# Patient Record
Sex: Male | Born: 1935 | Race: White | Hispanic: No | Marital: Married | Smoking: Never smoker
Health system: Southern US, Community
[De-identification: ages and names within clinical notes are randomized; demographics above are authoritative.]

## PROBLEM LIST (undated history)

## (undated) DIAGNOSIS — I714 Abdominal aortic aneurysm, without rupture, unspecified: Secondary | ICD-10-CM

## (undated) DIAGNOSIS — K219 Gastro-esophageal reflux disease without esophagitis: Secondary | ICD-10-CM

## (undated) DIAGNOSIS — I2510 Atherosclerotic heart disease of native coronary artery without angina pectoris: Secondary | ICD-10-CM

## (undated) DIAGNOSIS — Z8719 Personal history of other diseases of the digestive system: Secondary | ICD-10-CM

## (undated) DIAGNOSIS — I639 Cerebral infarction, unspecified: Secondary | ICD-10-CM

## (undated) DIAGNOSIS — I10 Essential (primary) hypertension: Secondary | ICD-10-CM

## (undated) DIAGNOSIS — H9190 Unspecified hearing loss, unspecified ear: Secondary | ICD-10-CM

## (undated) DIAGNOSIS — E785 Hyperlipidemia, unspecified: Secondary | ICD-10-CM

## (undated) DIAGNOSIS — J189 Pneumonia, unspecified organism: Secondary | ICD-10-CM

## (undated) DIAGNOSIS — J40 Bronchitis, not specified as acute or chronic: Secondary | ICD-10-CM

## (undated) DIAGNOSIS — M1990 Unspecified osteoarthritis, unspecified site: Secondary | ICD-10-CM

## (undated) DIAGNOSIS — N529 Male erectile dysfunction, unspecified: Secondary | ICD-10-CM

## (undated) HISTORY — DX: Male erectile dysfunction, unspecified: N52.9

## (undated) HISTORY — DX: Cerebral infarction, unspecified: I63.9

## (undated) HISTORY — DX: Unspecified osteoarthritis, unspecified site: M19.90

## (undated) HISTORY — PX: TONSILLECTOMY: SUR1361

## (undated) HISTORY — DX: Essential (primary) hypertension: I10

## (undated) HISTORY — PX: EYE SURGERY: SHX253

## (undated) HISTORY — DX: Atherosclerotic heart disease of native coronary artery without angina pectoris: I25.10

## (undated) HISTORY — DX: Abdominal aortic aneurysm, without rupture, unspecified: I71.40

## (undated) HISTORY — DX: Abdominal aortic aneurysm, without rupture: I71.4

## (undated) HISTORY — DX: Hyperlipidemia, unspecified: E78.5

## (undated) HISTORY — PX: INNER EAR SURGERY: SHX679

---

## 1980-07-25 HISTORY — PX: INNER EAR SURGERY: SHX679

## 1997-12-06 ENCOUNTER — Emergency Department (HOSPITAL_COMMUNITY): Admission: EM | Admit: 1997-12-06 | Discharge: 1997-12-06 | Payer: Self-pay | Admitting: Emergency Medicine

## 1997-12-15 ENCOUNTER — Ambulatory Visit (HOSPITAL_COMMUNITY): Admission: RE | Admit: 1997-12-15 | Discharge: 1997-12-15 | Payer: Self-pay | Admitting: Interventional Cardiology

## 2003-04-18 ENCOUNTER — Encounter: Payer: Self-pay | Admitting: Emergency Medicine

## 2003-04-18 ENCOUNTER — Emergency Department (HOSPITAL_COMMUNITY): Admission: EM | Admit: 2003-04-18 | Discharge: 2003-04-18 | Payer: Self-pay | Admitting: Emergency Medicine

## 2013-06-23 ENCOUNTER — Encounter: Payer: Self-pay | Admitting: Cardiology

## 2013-06-23 DIAGNOSIS — I10 Essential (primary) hypertension: Secondary | ICD-10-CM | POA: Insufficient documentation

## 2013-06-23 DIAGNOSIS — I2510 Atherosclerotic heart disease of native coronary artery without angina pectoris: Secondary | ICD-10-CM | POA: Insufficient documentation

## 2013-06-23 DIAGNOSIS — E785 Hyperlipidemia, unspecified: Secondary | ICD-10-CM | POA: Insufficient documentation

## 2013-06-23 DIAGNOSIS — I639 Cerebral infarction, unspecified: Secondary | ICD-10-CM | POA: Insufficient documentation

## 2013-06-24 ENCOUNTER — Encounter: Payer: Self-pay | Admitting: Interventional Cardiology

## 2013-06-24 ENCOUNTER — Ambulatory Visit
Admission: RE | Admit: 2013-06-24 | Discharge: 2013-06-24 | Disposition: A | Discharge status: Home / Self Care | Payer: Medicare Other | Source: Ambulatory Visit | Attending: Interventional Cardiology | Admitting: Interventional Cardiology

## 2013-06-24 ENCOUNTER — Ambulatory Visit (INDEPENDENT_AMBULATORY_CARE_PROVIDER_SITE_OTHER): Payer: Medicare Other | Admitting: Interventional Cardiology

## 2013-06-24 VITALS — BP 110/72 | HR 61 | Ht 69.0 in | Wt 216.0 lb

## 2013-06-24 DIAGNOSIS — I639 Cerebral infarction, unspecified: Secondary | ICD-10-CM

## 2013-06-24 DIAGNOSIS — I6350 Cerebral infarction due to unspecified occlusion or stenosis of unspecified cerebral artery: Secondary | ICD-10-CM

## 2013-06-24 DIAGNOSIS — E785 Hyperlipidemia, unspecified: Secondary | ICD-10-CM

## 2013-06-24 DIAGNOSIS — I209 Angina pectoris, unspecified: Secondary | ICD-10-CM

## 2013-06-24 DIAGNOSIS — I2510 Atherosclerotic heart disease of native coronary artery without angina pectoris: Secondary | ICD-10-CM

## 2013-06-24 DIAGNOSIS — I10 Essential (primary) hypertension: Secondary | ICD-10-CM

## 2013-06-24 LAB — CBC WITH DIFFERENTIAL/PLATELET
Basophils Relative: 0.6 % (ref 0.0–3.0)
Eosinophils Absolute: 0.1 10*3/uL (ref 0.0–0.7)
Eosinophils Relative: 0.6 % (ref 0.0–5.0)
Hemoglobin: 11.4 g/dL — ABNORMAL LOW (ref 13.0–17.0)
Lymphocytes Relative: 24.1 % (ref 12.0–46.0)
MCHC: 33 g/dL (ref 30.0–36.0)
Monocytes Relative: 6.7 % (ref 3.0–12.0)
Neutro Abs: 5.7 10*3/uL (ref 1.4–7.7)
RBC: 4.08 Mil/uL — ABNORMAL LOW (ref 4.22–5.81)
WBC: 8.4 10*3/uL (ref 4.5–10.5)

## 2013-06-24 LAB — BASIC METABOLIC PANEL
CO2: 27 mEq/L (ref 19–32)
Calcium: 8.9 mg/dL (ref 8.4–10.5)
Creatinine, Ser: 1.2 mg/dL (ref 0.4–1.5)
Potassium: 4.6 mEq/L (ref 3.5–5.1)
Sodium: 141 mEq/L (ref 135–145)

## 2013-06-24 MED ORDER — NITROGLYCERIN 0.4 MG SL SUBL: 0.4000 mg | SUBLINGUAL_TABLET | SUBLINGUAL | Status: DC | PRN

## 2013-06-24 NOTE — Patient Instructions (Addendum)
Your physician has requested that you have a cardiac catheterization. Cardiac catheterization is used to diagnose and/or treat various heart conditions. Doctors may recommend this procedure for a number of different reasons. The most common reason is to evaluate chest pain. Chest pain can be a symptom of coronary artery disease (CAD), and cardiac catheterization can show whether plaque is narrowing or blocking your heart's arteries. This procedure is also used to evaluate the valves, as well as measure the blood flow and oxygen levels in different parts of your heart. For further information please visit [email or web address] Please follow instruction sheet, as given.   Your cardiac cath is scheduled for 06/27/13 @7 :30 am  Labs today: Bmet, Cbc  A chest x-ray takes a picture of the organs and structures inside the chest, including the heart, lungs, and blood vessels. This test can show several things, including, whether the heart is enlarges; whether fluid is building up in the lungs; and whether pacemaker / defibrillator leads are still in place.  An Rx for Sublingual Nitroglycerin has been sent to your pharmacy  Nitroglycerin sublingual tablets What is this medicine? NITROGLYCERIN (nye troe GLI ser in) is a type of vasodilator. It relaxes blood vessels, increasing the blood and oxygen supply to your heart. This medicine is used to relieve chest pain caused by angina. It is also used to prevent chest pain before activities like climbing stairs, going outdoors in cold weather, or sexual activity. This medicine may be used for other purposes; ask your health care provider or pharmacist if you have questions. COMMON BRAND NAME(S): Nitroquick, Nitrostat, Nitrotab What should I tell my health care provider before I take this medicine? They need to know if you have any of these conditions: -anemia -head injury, recent stroke, or bleeding in the brain -liver disease -previous heart attack -an unusual or  allergic reaction to nitroglycerin, other medicines, foods, dyes, or preservatives -pregnant or trying to get pregnant -breast-feeding How should I use this medicine? Take this medicine by mouth as needed. At the first sign of an angina attack (chest pain or tightness) place one tablet under your tongue. You can also take this medicine 5 to 10 minutes before an event likely to produce chest pain. Follow the directions on the prescription label. Let the tablet dissolve under the tongue. Do not swallow whole. Replace the dose if you accidentally swallow it. It will help if your mouth is not dry. Saliva around the tablet will help it to dissolve more quickly. Do not eat or drink, smoke or chew tobacco while a tablet is dissolving. If you are not better within 5 minutes after taking ONE dose of nitroglycerin, call 9-1-1 immediately to seek emergency medical care. Do not take more than 3 nitroglycerin tablets over 15 minutes. If you take this medicine often to relieve symptoms of angina, your doctor or health care professional may provide you with different instructions to manage your symptoms. If symptoms do not go away after following these instructions, it is important to call 9-1-1 immediately. Do not take more than 3 nitroglycerin tablets over 15 minutes. Talk to your pediatrician regarding the use of this medicine in children. Special care may be needed. Overdosage: If you think you have taken too much of this medicine contact a poison control center or emergency room at once. NOTE: This medicine is only for you. Do not share this medicine with others. What if I miss a dose? This does not apply. This medicine is only used as needed. What  may interact with this medicine? Do not take this medicine with any of the following medications: -certain migraine medicines like ergotamine and dihydroergotamine (DHE) -medicines used to treat erectile dysfunction like sildenafil, tadalafil, and vardenafil This  medicine may also interact with the following medications: -alteplase -aspirin -heparin -medicines for high blood pressure -medicines for mental depression -other medicines used to treat angina -phenothiazines like chlorpromazine, mesoridazine, prochlorperazine, thioridazine This list may not describe all possible interactions. Give your health care provider a list of all the medicines, herbs, non-prescription drugs, or dietary supplements you use. Also tell them if you smoke, drink alcohol, or use illegal drugs. Some items may interact with your medicine. What should I watch for while using this medicine? Tell your doctor or health care professional if you feel your medicine is no longer working. Keep this medicine with you at all times. Sit or lie down when you take your medicine to prevent falling if you feel dizzy or faint after using it. Try to remain calm. This will help you to feel better faster. If you feel dizzy, take several deep breaths and lie down with your feet propped up, or bend forward with your head resting between your knees. You may get drowsy or dizzy. Do not drive, use machinery, or do anything that needs mental alertness until you know how this drug affects you. Do not stand or sit up quickly, especially if you are an older patient. This reduces the risk of dizzy or fainting spells. Alcohol can make you more drowsy and dizzy. Avoid alcoholic drinks. Do not treat yourself for coughs, colds, or pain while you are taking this medicine without asking your doctor or health care professional for advice. Some ingredients may increase your blood pressure. What side effects may I notice from receiving this medicine? Side effects that you should report to your doctor or health care professional as soon as possible: -blurred vision -dry mouth -skin rash -sweating -the feeling of extreme pressure in the head -unusually weak or tired Side effects that usually do not require medical  attention (report to your doctor or health care professional if they continue or are bothersome): -flushing of the face or neck -headache -irregular heartbeat, palpitations -nausea, vomiting This list may not describe all possible side effects. Call your doctor for medical advice about side effects. You may report side effects to FDA at 1-800-FDA-1088. Where should I keep my medicine? Keep out of the reach of children. Store at room temperature between 20 and 25 degrees C (68 and 77 degrees F). Store in Retail buyer. Protect from light and moisture. Keep tightly closed. Throw away any unused medicine after the expiration date. NOTE: This sheet is a summary. It may not cover all possible information. If you have questions about this medicine, talk to your doctor, pharmacist, or health care provider.  2014, Elsevier/Gold Standard. (2008-02-01 17:16:24)

## 2013-06-24 NOTE — Progress Notes (Signed)
Patient ID: Tyler [name], male   DOB: Sep 30, 1928, 79 y.o.   MRN: [id number]    [address] [place], [place]  [address] Phone: [phone] Fax:  [phone]  Date:  06/24/2013   ID:  Tyler [name], DOB 02-05-1929, MRN [id number]  PCP:  No primary provider on file.   ASSESSMENT:  1. Coronary artery disease with nonobstructive CAD by cath in 2000. Now with ECG abnormality and abnormal ECG 2. Hypertension 3. Hyperlipidemia 4. CVA  PLAN:  1. NTG SL 2. Diagnostic coronary angiography. The procedure and risks of stroke, death, myocardial infarction, allergy, kidney injury, limb ischemia, among others were discussed in detail and except above the patient.   SUBJECTIVE: Tyler [name] is a 79 y.o. male who has developed chest discomfort on exertion since last office visit in December 2013. Walking relatively short distances, particularly after eating brings on tightness in the chest. It takes up to 10 minutes to resolve. Today's EKG reveals biphasic anterior T-wave abnormality with poor R-wave progression. He denies discomfort at rest. He is having some difficulty with swallowing dry foods, which tend to stick. He has to get up to go down with warm liquids. He denies orthopnea, PND, radiation, diaphoresis, and other complaints.   Wt Readings from Last 3 Encounters:  06/24/13 216 lb (97.977 kg)     Past Medical History  Diagnosis Date  . Coronary atherosclerosis   . Osteoarthritis   . Hypertension   . Hyperlipidemia   . Erectile dysfunction   . CVA (cerebral infarction)     Current Outpatient Prescriptions  Medication Sig Dispense Refill  . aspirin 81 MG tablet Take 81 mg by mouth daily.      . Cholecalciferol (VITAMIN D-3) 1000 UNITS CAPS Take by mouth daily.      . clopidogrel (PLAVIX) 75 MG tablet Take 75 mg by mouth daily with breakfast.      . famotidine (PEPCID) 20 MG tablet Take 20 mg by mouth daily.      . folic acid (FOLVITE) 800 MCG tablet Take 400 mcg  by mouth daily.      . hydrocodone-acetaminophen (LORCET-HD) 5-500 MG per capsule Take 1 capsule by mouth every 6 (six) hours as needed for pain.      . meloxicam (MOBIC) 7.5 MG tablet Take 7.5 mg by mouth as needed for pain.      . metoprolol tartrate (LOPRESSOR) 25 MG tablet Take 25 mg by mouth 2 (two) times daily.      . Omega-3 Fatty Acids (FISH OIL PO) Take 360 Units by mouth daily.      . pravastatin (PRAVACHOL) 40 MG tablet Take 40 mg by mouth daily.      [name] terazosin (HYTRIN) 5 MG capsule Take 5 mg by mouth at bedtime.       No current facility-administered medications for this visit.    Allergies:   No Known Allergies  Social History:  The patient  reports that he has never smoked. He has never used smokeless tobacco. He reports that he drinks alcohol. He reports that he does not use illicit drugs.   ROS:  Please see the history of present illness.   With prior history of CVA. CV occurred in 2012. He denies lung disease, orthopnea, palpitations, edema, syncope, claudication, and diabetes.   All other systems reviewed and negative.   OBJECTIVE: VS:  Ht 5\' 9"  (1.753 m)  Wt 216 lb (97.977 kg)  BMI 31.88 kg/m2 Well nourished, well developed,  in no acute distress, elderly HEENT: normal Neck: JVD flat. Carotid bruit absent  Cardiac:  normal S1, S2; RRR; no murmur Lungs:  clear to auscultation bilaterally, no wheezing, rhonchi or rales Abd: soft, nontender, no hepatomegaly Ext: Edema  Trace to 1+ bilateral . Pulses 2+ and symmetric. Both lower extremities have healing lesions from skin cancer removal.  Skin: warm and dry Neuro:  CNs 2-12 intact, no focal abnormalities noted  EKG:  Normal sinus rhythm with poor R-wave progression and anterior T-wave abnormality suggestive of ischemia.       Signed, Darci Needle III, MD 06/24/2013 10:31 AM

## 2013-06-25 ENCOUNTER — Encounter (HOSPITAL_COMMUNITY): Payer: Self-pay | Admitting: Pharmacy Technician

## 2013-06-26 ENCOUNTER — Other Ambulatory Visit: Payer: Self-pay | Admitting: Interventional Cardiology

## 2013-06-26 ENCOUNTER — Telehealth: Payer: Self-pay

## 2013-06-26 DIAGNOSIS — I209 Angina pectoris, unspecified: Secondary | ICD-10-CM

## 2013-06-26 NOTE — Telephone Encounter (Signed)
lmom pt chest xray.No active cardiopulmonary disease on chest x-ray

## 2013-06-26 NOTE — Telephone Encounter (Signed)
Message copied by Jarvis Newcomer on Wed Jun 26, 2013  5:30 PM ------      Message from: Verdis Prime      Created: Tue Jun 25, 2013  5:40 PM       No active cardiopulmonary disease on chest x-ray ------

## 2013-06-26 NOTE — Telephone Encounter (Signed)
pt wife notified cath time changed form 7:30 to 8:30. pt to arrive at 6:30 am [facility] short stay. pt wife verbalized understanding

## 2013-06-27 ENCOUNTER — Encounter (HOSPITAL_COMMUNITY): Payer: Self-pay | Admitting: Anesthesiology

## 2013-06-27 ENCOUNTER — Encounter (HOSPITAL_COMMUNITY)
Admission: RE | Disposition: A | Discharge status: Home Health | Payer: Medicare Other | Source: Ambulatory Visit | Attending: Cardiothoracic Surgery

## 2013-06-27 ENCOUNTER — Inpatient Hospital Stay (HOSPITAL_COMMUNITY)
Admission: RE | Admit: 2013-06-27 | Discharge: 2013-07-03 | DRG: 233 | Disposition: A | Discharge status: Home Health | Payer: Medicare Other | Source: Ambulatory Visit | Attending: Cardiothoracic Surgery | Admitting: Cardiothoracic Surgery

## 2013-06-27 ENCOUNTER — Ambulatory Visit (HOSPITAL_COMMUNITY): Payer: Medicare Other | Admitting: Anesthesiology

## 2013-06-27 ENCOUNTER — Encounter (HOSPITAL_COMMUNITY)
Admission: RE | Disposition: A | Discharge status: Home Health | Payer: Self-pay | Source: Ambulatory Visit | Attending: Cardiothoracic Surgery

## 2013-06-27 ENCOUNTER — Encounter (HOSPITAL_COMMUNITY): Payer: Medicare Other | Admitting: Anesthesiology

## 2013-06-27 ENCOUNTER — Inpatient Hospital Stay (HOSPITAL_COMMUNITY): Payer: Medicare Other

## 2013-06-27 DIAGNOSIS — J9819 Other pulmonary collapse: Secondary | ICD-10-CM | POA: Diagnosis not present

## 2013-06-27 DIAGNOSIS — Z7902 Long term (current) use of antithrombotics/antiplatelets: Secondary | ICD-10-CM

## 2013-06-27 DIAGNOSIS — I5033 Acute on chronic diastolic (congestive) heart failure: Secondary | ICD-10-CM | POA: Diagnosis present

## 2013-06-27 DIAGNOSIS — E785 Hyperlipidemia, unspecified: Secondary | ICD-10-CM | POA: Diagnosis present

## 2013-06-27 DIAGNOSIS — I509 Heart failure, unspecified: Secondary | ICD-10-CM | POA: Diagnosis present

## 2013-06-27 DIAGNOSIS — Z951 Presence of aortocoronary bypass graft: Secondary | ICD-10-CM

## 2013-06-27 DIAGNOSIS — IMO0001 Reserved for inherently not codable concepts without codable children: Secondary | ICD-10-CM | POA: Diagnosis not present

## 2013-06-27 DIAGNOSIS — Z79899 Other long term (current) drug therapy: Secondary | ICD-10-CM

## 2013-06-27 DIAGNOSIS — Z8673 Personal history of transient ischemic attack (TIA), and cerebral infarction without residual deficits: Secondary | ICD-10-CM

## 2013-06-27 DIAGNOSIS — I639 Cerebral infarction, unspecified: Secondary | ICD-10-CM

## 2013-06-27 DIAGNOSIS — L97909 Non-pressure chronic ulcer of unspecified part of unspecified lower leg with unspecified severity: Secondary | ICD-10-CM | POA: Diagnosis present

## 2013-06-27 DIAGNOSIS — I469 Cardiac arrest, cause unspecified: Secondary | ICD-10-CM | POA: Diagnosis not present

## 2013-06-27 DIAGNOSIS — I10 Essential (primary) hypertension: Secondary | ICD-10-CM | POA: Diagnosis present

## 2013-06-27 DIAGNOSIS — I209 Angina pectoris, unspecified: Secondary | ICD-10-CM

## 2013-06-27 DIAGNOSIS — K5900 Constipation, unspecified: Secondary | ICD-10-CM | POA: Diagnosis not present

## 2013-06-27 DIAGNOSIS — Z7982 Long term (current) use of aspirin: Secondary | ICD-10-CM

## 2013-06-27 DIAGNOSIS — R570 Cardiogenic shock: Secondary | ICD-10-CM

## 2013-06-27 DIAGNOSIS — R9431 Abnormal electrocardiogram [ECG] [EKG]: Secondary | ICD-10-CM | POA: Diagnosis present

## 2013-06-27 DIAGNOSIS — I2510 Atherosclerotic heart disease of native coronary artery without angina pectoris: Secondary | ICD-10-CM

## 2013-06-27 DIAGNOSIS — I5031 Acute diastolic (congestive) heart failure: Secondary | ICD-10-CM

## 2013-06-27 DIAGNOSIS — D62 Acute posthemorrhagic anemia: Secondary | ICD-10-CM | POA: Diagnosis not present

## 2013-06-27 DIAGNOSIS — R579 Shock, unspecified: Secondary | ICD-10-CM | POA: Diagnosis not present

## 2013-06-27 DIAGNOSIS — D696 Thrombocytopenia, unspecified: Secondary | ICD-10-CM | POA: Diagnosis not present

## 2013-06-27 DIAGNOSIS — I519 Heart disease, unspecified: Secondary | ICD-10-CM | POA: Diagnosis not present

## 2013-06-27 HISTORY — PX: CORONARY ARTERY BYPASS GRAFT: SHX141

## 2013-06-27 HISTORY — PX: TEE WITHOUT CARDIOVERSION: SHX5443

## 2013-06-27 HISTORY — PX: LEFT HEART CATHETERIZATION WITH CORONARY ANGIOGRAM: SHX5451

## 2013-06-27 HISTORY — PX: ENDOVEIN HARVEST OF GREATER SAPHENOUS VEIN: SHX5059

## 2013-06-27 LAB — POCT I-STAT 3, ART BLOOD GAS (G3+)
Acid-base deficit: 1 mmol/L (ref 0.0–2.0)
Acid-base deficit: 2 mmol/L (ref 0.0–2.0)
Acid-base deficit: 3 mmol/L — ABNORMAL HIGH (ref 0.0–2.0)
Acid-base deficit: 1 mmol/L (ref 0.0–2.0)
Bicarbonate: 25.5 mEq/L — ABNORMAL HIGH (ref 20.0–24.0)
Bicarbonate: 23.3 mEq/L (ref 20.0–24.0)
Bicarbonate: 23.1 mEq/L (ref 20.0–24.0)
Bicarbonate: 22.2 mEq/L (ref 20.0–24.0)
Bicarbonate: 23.7 mEq/L (ref 20.0–24.0)
O2 Saturation: 100 %
O2 Saturation: 100 %
O2 Saturation: 100 %
O2 Saturation: 100 %
O2 Saturation: 99 %
Patient temperature: 35.4
Patient temperature: 36.5
TCO2: 27 mmol/L (ref 0–100)
TCO2: 23 mmol/L (ref 0–100)
TCO2: 23 mmol/L (ref 0–100)
TCO2: 25 mmol/L (ref 0–100)
pCO2 arterial: 35.3 mmHg (ref 35.0–45.0)
pCO2 arterial: 37 mmHg (ref 35.0–45.0)
pCO2 arterial: 36.1 mmHg (ref 35.0–45.0)
pH, Arterial: 7.412 (ref 7.350–7.450)
pO2, Arterial: 478 mmHg — ABNORMAL HIGH (ref 80.0–100.0)
pO2, Arterial: 274 mmHg — ABNORMAL HIGH (ref 80.0–100.0)
pO2, Arterial: 357 mmHg — ABNORMAL HIGH (ref 80.0–100.0)
pO2, Arterial: 273 mmHg — ABNORMAL HIGH (ref 80.0–100.0)
pO2, Arterial: 142 mmHg — ABNORMAL HIGH (ref 80.0–100.0)

## 2013-06-27 LAB — POCT I-STAT, CHEM 8
BUN: 12 mg/dL (ref 6–23)
Calcium, Ion: 1.13 mmol/L (ref 1.13–1.30)
Chloride: 111 mEq/L (ref 96–112)
Creatinine, Ser: 1.1 mg/dL (ref 0.50–1.35)
Potassium: 3.5 mEq/L (ref 3.5–5.1)
TCO2: 22 mmol/L (ref 0–100)

## 2013-06-27 LAB — CBC
HCT: 27.8 % — ABNORMAL LOW (ref 39.0–52.0)
HCT: 27.2 % — ABNORMAL LOW (ref 39.0–52.0)
Hemoglobin: 9.2 g/dL — ABNORMAL LOW (ref 13.0–17.0)
Hemoglobin: 9.1 g/dL — ABNORMAL LOW (ref 13.0–17.0)
MCH: 28 pg (ref 26.0–34.0)
MCH: 28.2 pg (ref 26.0–34.0)
MCHC: 33.1 g/dL (ref 30.0–36.0)
MCHC: 33.5 g/dL (ref 30.0–36.0)
MCV: 84.8 fL (ref 78.0–100.0)
MCV: 84.2 fL (ref 78.0–100.0)
Platelets: 119 10*3/uL — ABNORMAL LOW (ref 150–400)
RBC: 3.28 MIL/uL — ABNORMAL LOW (ref 4.22–5.81)
RBC: 3.23 MIL/uL — ABNORMAL LOW (ref 4.22–5.81)
RDW: 15.3 % (ref 11.5–15.5)
RDW: 15.8 % — ABNORMAL HIGH (ref 11.5–15.5)
WBC: 11.1 10*3/uL — ABNORMAL HIGH (ref 4.0–10.5)
WBC: 11.6 10*3/uL — ABNORMAL HIGH (ref 4.0–10.5)

## 2013-06-27 LAB — POCT I-STAT 4, (NA,K, GLUC, HGB,HCT)
Glucose, Bld: 160 mg/dL — ABNORMAL HIGH (ref 70–99)
Glucose, Bld: 122 mg/dL — ABNORMAL HIGH (ref 70–99)
Glucose, Bld: 116 mg/dL — ABNORMAL HIGH (ref 70–99)
Glucose, Bld: 89 mg/dL (ref 70–99)
Glucose, Bld: 135 mg/dL — ABNORMAL HIGH (ref 70–99)
Glucose, Bld: 148 mg/dL — ABNORMAL HIGH (ref 70–99)
HCT: 20 % — ABNORMAL LOW (ref 39.0–52.0)
HCT: 29 % — ABNORMAL LOW (ref 39.0–52.0)
Hemoglobin: 6.8 g/dL — CL (ref 13.0–17.0)
Hemoglobin: 9.9 g/dL — ABNORMAL LOW (ref 13.0–17.0)
Hemoglobin: 8.2 g/dL — ABNORMAL LOW (ref 13.0–17.0)
Potassium: 3.6 mEq/L (ref 3.5–5.1)
Potassium: 3.8 mEq/L (ref 3.5–5.1)
Potassium: 4.8 mEq/L (ref 3.5–5.1)
Potassium: 4.1 mEq/L (ref 3.5–5.1)
Sodium: 141 mEq/L (ref 135–145)
Sodium: 140 mEq/L (ref 135–145)
Sodium: 141 mEq/L (ref 135–145)
Sodium: 142 mEq/L (ref 135–145)

## 2013-06-27 LAB — HEMOGLOBIN AND HEMATOCRIT, BLOOD
HCT: 23.7 % — ABNORMAL LOW (ref 39.0–52.0)
Hemoglobin: 7.7 g/dL — ABNORMAL LOW (ref 13.0–17.0)

## 2013-06-27 LAB — GLUCOSE, CAPILLARY
Glucose-Capillary: 124 mg/dL — ABNORMAL HIGH (ref 70–99)
Glucose-Capillary: 138 mg/dL — ABNORMAL HIGH (ref 70–99)
Glucose-Capillary: 121 mg/dL — ABNORMAL HIGH (ref 70–99)
Glucose-Capillary: 123 mg/dL — ABNORMAL HIGH (ref 70–99)

## 2013-06-27 LAB — MAGNESIUM: Magnesium: 2.9 mg/dL — ABNORMAL HIGH (ref 1.5–2.5)

## 2013-06-27 LAB — CREATININE, SERUM
Creatinine, Ser: 0.95 mg/dL (ref 0.50–1.35)
GFR calc Af Amer: 87 mL/min — ABNORMAL LOW (ref >90)
GFR calc non Af Amer: 75 mL/min — ABNORMAL LOW (ref >90)

## 2013-06-27 LAB — PLATELET COUNT: Platelets: 72 10*3/uL — ABNORMAL LOW (ref 150–400)

## 2013-06-27 LAB — PROTIME-INR
INR: 0.98 (ref 0.00–1.49)
INR: 1.52 — ABNORMAL HIGH (ref 0.00–1.49)
Prothrombin Time: 12.8 seconds (ref 11.6–15.2)

## 2013-06-27 LAB — MRSA PCR SCREENING: MRSA by PCR: NEGATIVE

## 2013-06-27 LAB — APTT: aPTT: 31 seconds (ref 24–37)

## 2013-06-27 LAB — ABO/RH: ABO/RH(D): A NEG

## 2013-06-27 SURGERY — CORONARY ARTERY BYPASS GRAFTING (CABG)
Anesthesia: General | Site: Chest

## 2013-06-27 SURGERY — CORONARY ARTERY BYPASS GRAFTING (CABG)
Anesthesia: General | Site: Leg Upper | Laterality: Right

## 2013-06-27 SURGERY — LEFT HEART CATHETERIZATION WITH CORONARY ANGIOGRAM
Anesthesia: LOCAL

## 2013-06-27 MED ORDER — LIDOCAINE HCL (PF) 1 % IJ SOLN
INTRAMUSCULAR | Status: AC
  Filled 2013-06-27: qty 30

## 2013-06-27 MED ORDER — BISACODYL 10 MG RE SUPP
10.0000 mg | Freq: Every day | RECTAL | Status: DC
  Administered 2013-06-29: 10 mg via RECTAL
  Filled 2013-06-27 (×2): qty 1

## 2013-06-27 MED ORDER — ACETAMINOPHEN 500 MG PO TABS
1000.0000 mg | ORAL_TABLET | Freq: Four times a day (QID) | ORAL | Status: AC
  Administered 2013-06-28 – 2013-06-30 (×7): 1000 mg via ORAL
  Administered 2013-07-01: 500 mg via ORAL
  Administered 2013-07-01 – 2013-07-02 (×3): 1000 mg via ORAL
  Filled 2013-06-27 (×19): qty 2

## 2013-06-27 MED ORDER — SODIUM CHLORIDE 0.9 % IV SOLN: 1.0000 mL/kg/h | INTRAVENOUS | Status: DC

## 2013-06-27 MED ORDER — EPINEPHRINE HCL 1 MG/ML IJ SOLN
0.5000 ug/min | INTRAVENOUS | Status: DC
  Filled 2013-06-27: qty 4

## 2013-06-27 MED ORDER — ROCURONIUM BROMIDE 100 MG/10ML IV SOLN
INTRAVENOUS | Status: DC | PRN
  Administered 2013-06-27 (×5): 50 mg via INTRAVENOUS

## 2013-06-27 MED ORDER — HEPARIN (PORCINE) IN NACL 2-0.9 UNIT/ML-% IJ SOLN
INTRAMUSCULAR | Status: AC
  Filled 2013-06-27: qty 1000

## 2013-06-27 MED ORDER — SODIUM CHLORIDE 0.9 % IV SOLN: INTRAVENOUS | Status: DC

## 2013-06-27 MED ORDER — LACTATED RINGERS IV SOLN
INTRAVENOUS | Status: DC | PRN
  Administered 2013-06-27: 10:00:00 via INTRAVENOUS

## 2013-06-27 MED ORDER — SODIUM CHLORIDE 0.9 % IV SOLN
INTRAVENOUS | Status: DC
  Administered 2013-06-27: 3 [IU]/h via INTRAVENOUS
  Filled 2013-06-27: qty 1

## 2013-06-27 MED ORDER — HEPARIN SODIUM (PORCINE) 1000 UNIT/ML IJ SOLN
INTRAMUSCULAR | Status: DC | PRN
  Administered 2013-06-27: 20000 [IU] via INTRAVENOUS
  Administered 2013-06-27: 3000 [IU] via INTRAVENOUS

## 2013-06-27 MED ORDER — SODIUM CHLORIDE 0.9 % IV SOLN
250.0000 mL | INTRAVENOUS | Status: DC
  Administered 2013-06-28: 250 mL via INTRAVENOUS

## 2013-06-27 MED ORDER — ACETAMINOPHEN 650 MG RE SUPP
650.0000 mg | Freq: Once | RECTAL | Status: AC
  Administered 2013-06-27: 650 mg via RECTAL

## 2013-06-27 MED ORDER — LACTATED RINGERS IV SOLN: 500.0000 mL | Freq: Once | INTRAVENOUS | Status: DC | PRN

## 2013-06-27 MED ORDER — SODIUM CHLORIDE 0.9 % IV SOLN
INTRAVENOUS | Status: DC
  Administered 2013-06-27: 69.8 mL/h via INTRAVENOUS
  Filled 2013-06-27: qty 40

## 2013-06-27 MED ORDER — LACTATED RINGERS IV SOLN
INTRAVENOUS | Status: DC | PRN
  Administered 2013-06-27 (×2): via INTRAVENOUS

## 2013-06-27 MED ORDER — SODIUM CHLORIDE 0.9 % IV SOLN
INTRAVENOUS | Status: DC
  Filled 2013-06-27: qty 1

## 2013-06-27 MED ORDER — OXYCODONE HCL 5 MG PO TABS
5.0000 mg | ORAL_TABLET | ORAL | Status: DC | PRN
  Administered 2013-06-28 (×2): 5 mg via ORAL
  Filled 2013-06-27 (×2): qty 1

## 2013-06-27 MED ORDER — NITROGLYCERIN IN D5W 200-5 MCG/ML-% IV SOLN: 0.0000 ug/min | INTRAVENOUS | Status: DC

## 2013-06-27 MED ORDER — NITROGLYCERIN IN D5W 200-5 MCG/ML-% IV SOLN
2.0000 ug/min | INTRAVENOUS | Status: DC
  Administered 2013-06-27: 5 ug/min via INTRAVENOUS
  Filled 2013-06-27: qty 250

## 2013-06-27 MED ORDER — DEXTROSE 5 % IV SOLN
1.5000 g | Freq: Two times a day (BID) | INTRAVENOUS | Status: AC
  Administered 2013-06-27 – 2013-06-29 (×4): 1.5 g via INTRAVENOUS
  Filled 2013-06-27 (×4): qty 1.5

## 2013-06-27 MED ORDER — GLYCOPYRROLATE 0.2 MG/ML IJ SOLN
INTRAMUSCULAR | Status: DC | PRN
  Administered 2013-06-27: 0.2 mg via INTRAVENOUS

## 2013-06-27 MED ORDER — SODIUM CHLORIDE 0.9 % IJ SOLN: 3.0000 mL | INTRAMUSCULAR | Status: DC | PRN

## 2013-06-27 MED ORDER — ASPIRIN 81 MG PO CHEW: 324.0000 mg | CHEWABLE_TABLET | Freq: Every day | ORAL | Status: DC

## 2013-06-27 MED ORDER — METOPROLOL TARTRATE 12.5 MG HALF TABLET
12.5000 mg | ORAL_TABLET | Freq: Two times a day (BID) | ORAL | Status: DC
  Administered 2013-06-28 – 2013-06-29 (×3): 12.5 mg via ORAL
  Filled 2013-06-27 (×7): qty 1

## 2013-06-27 MED ORDER — POTASSIUM CHLORIDE 10 MEQ/50ML IV SOLN
10.0000 meq | INTRAVENOUS | Status: AC
  Administered 2013-06-28 (×2): 10 meq via INTRAVENOUS

## 2013-06-27 MED ORDER — PHENYLEPHRINE HCL 10 MG/ML IJ SOLN
10.0000 mg | INTRAVENOUS | Status: DC | PRN
  Administered 2013-06-27: 20 ug/min via INTRAVENOUS

## 2013-06-27 MED ORDER — BISACODYL 5 MG PO TBEC
10.0000 mg | DELAYED_RELEASE_TABLET | Freq: Every day | ORAL | Status: DC
  Administered 2013-06-28 – 2013-07-03 (×2): 10 mg via ORAL
  Filled 2013-06-27 (×4): qty 2

## 2013-06-27 MED ORDER — DOCUSATE SODIUM 100 MG PO CAPS
200.0000 mg | ORAL_CAPSULE | Freq: Every day | ORAL | Status: DC
  Administered 2013-06-28 – 2013-07-03 (×6): 200 mg via ORAL
  Filled 2013-06-27 (×7): qty 2

## 2013-06-27 MED ORDER — MAGNESIUM SULFATE 50 % IJ SOLN
40.0000 meq | INTRAMUSCULAR | Status: DC
  Filled 2013-06-27: qty 10

## 2013-06-27 MED ORDER — SODIUM CHLORIDE 0.9 % IV SOLN
0.0000 mg/h | INTRAVENOUS | Status: DC
  Filled 2013-06-27: qty 10

## 2013-06-27 MED ORDER — DEXMEDETOMIDINE HCL IN NACL 400 MCG/100ML IV SOLN
0.1000 ug/kg/h | INTRAVENOUS | Status: DC
  Administered 2013-06-27: .2 ug/kg/h via INTRAVENOUS
  Filled 2013-06-27: qty 100

## 2013-06-27 MED ORDER — VANCOMYCIN HCL IN DEXTROSE 1-5 GM/200ML-% IV SOLN
1000.0000 mg | Freq: Once | INTRAVENOUS | Status: AC
  Administered 2013-06-27: 1000 mg via INTRAVENOUS
  Filled 2013-06-27: qty 200

## 2013-06-27 MED ORDER — DEXMEDETOMIDINE HCL IN NACL 400 MCG/100ML IV SOLN
0.1000 ug/kg/h | INTRAVENOUS | Status: DC
  Filled 2013-06-27: qty 100

## 2013-06-27 MED ORDER — TERAZOSIN HCL 5 MG PO CAPS
5.0000 mg | ORAL_CAPSULE | Freq: Every day | ORAL | Status: DC
  Administered 2013-06-28 – 2013-07-02 (×5): 5 mg via ORAL
  Filled 2013-06-27 (×7): qty 1

## 2013-06-27 MED ORDER — DEXTROSE 5 % IV SOLN
750.0000 mg | INTRAVENOUS | Status: DC
  Filled 2013-06-27: qty 750

## 2013-06-27 MED ORDER — HEPARIN SODIUM (PORCINE) 1000 UNIT/ML IJ SOLN
INTRAMUSCULAR | Status: AC
  Filled 2013-06-27: qty 1

## 2013-06-27 MED ORDER — METOPROLOL TARTRATE 25 MG/10 ML ORAL SUSPENSION
12.5000 mg | Freq: Two times a day (BID) | ORAL | Status: DC
  Filled 2013-06-27 (×7): qty 5

## 2013-06-27 MED ORDER — MIDAZOLAM HCL 2 MG/2ML IJ SOLN
INTRAMUSCULAR | Status: AC
  Filled 2013-06-27: qty 2

## 2013-06-27 MED ORDER — MORPHINE SULFATE 2 MG/ML IJ SOLN
1.0000 mg | INTRAMUSCULAR | Status: AC | PRN
  Filled 2013-06-27: qty 1

## 2013-06-27 MED ORDER — DEXMEDETOMIDINE HCL IN NACL 200 MCG/50ML IV SOLN
0.1000 ug/kg/h | INTRAVENOUS | Status: DC
  Filled 2013-06-27: qty 50

## 2013-06-27 MED ORDER — METOPROLOL TARTRATE 1 MG/ML IV SOLN: 2.5000 mg | INTRAVENOUS | Status: DC | PRN

## 2013-06-27 MED ORDER — NITROGLYCERIN IN D5W 200-5 MCG/ML-% IV SOLN
INTRAVENOUS | Status: AC
  Filled 2013-06-27: qty 250

## 2013-06-27 MED ORDER — DOPAMINE-DEXTROSE 3.2-5 MG/ML-% IV SOLN
2.0000 ug/kg/min | INTRAVENOUS | Status: DC
  Filled 2013-06-27: qty 250

## 2013-06-27 MED ORDER — SIMVASTATIN 20 MG PO TABS
20.0000 mg | ORAL_TABLET | Freq: Every day | ORAL | Status: DC
  Administered 2013-06-28 – 2013-07-02 (×5): 20 mg via ORAL
  Filled 2013-06-27 (×7): qty 1

## 2013-06-27 MED ORDER — METOCLOPRAMIDE HCL 5 MG/ML IJ SOLN
10.0000 mg | Freq: Four times a day (QID) | INTRAMUSCULAR | Status: AC
  Administered 2013-06-27 – 2013-06-28 (×4): 10 mg via INTRAVENOUS
  Filled 2013-06-27 (×4): qty 2

## 2013-06-27 MED ORDER — MAGNESIUM SULFATE 40 MG/ML IJ SOLN
4.0000 g | Freq: Once | INTRAMUSCULAR | Status: AC
  Administered 2013-06-27: 4 g via INTRAVENOUS
  Filled 2013-06-27: qty 100

## 2013-06-27 MED ORDER — SODIUM CHLORIDE 0.9 % IV SOLN
INTRAVENOUS | Status: DC
  Filled 2013-06-27: qty 30

## 2013-06-27 MED ORDER — HEMOSTATIC AGENTS (NO CHARGE) OPTIME
TOPICAL | Status: DC | PRN
  Administered 2013-06-27: 1 via TOPICAL

## 2013-06-27 MED ORDER — SODIUM CHLORIDE 0.9 % IV SOLN
0.0000 ug/h | INTRAVENOUS | Status: DC
  Filled 2013-06-27: qty 50

## 2013-06-27 MED ORDER — MIDAZOLAM HCL 5 MG/5ML IJ SOLN
INTRAMUSCULAR | Status: DC | PRN
  Administered 2013-06-27: 4 mg via INTRAVENOUS
  Administered 2013-06-27 (×2): 5 mg via INTRAVENOUS
  Administered 2013-06-27: 6 mg via INTRAVENOUS

## 2013-06-27 MED ORDER — SODIUM CHLORIDE 0.45 % IV SOLN
INTRAVENOUS | Status: DC
  Administered 2013-06-27: 16:00:00 via INTRAVENOUS

## 2013-06-27 MED ORDER — INSULIN REGULAR BOLUS VIA INFUSION
0.0000 [IU] | Freq: Three times a day (TID) | INTRAVENOUS | Status: DC
  Filled 2013-06-27: qty 10

## 2013-06-27 MED ORDER — SODIUM CHLORIDE 0.9 % IJ SOLN
3.0000 mL | Freq: Two times a day (BID) | INTRAMUSCULAR | Status: DC
Start: 2013-06-28 — End: 2013-06-30
  Administered 2013-06-28 – 2013-06-30 (×4): 3 mL via INTRAVENOUS

## 2013-06-27 MED ORDER — LACTATED RINGERS IV SOLN: INTRAVENOUS | Status: DC

## 2013-06-27 MED ORDER — ACETAMINOPHEN 160 MG/5ML PO SOLN: 650.0000 mg | Freq: Once | ORAL | Status: AC

## 2013-06-27 MED ORDER — DEXTROSE 5 % IV SOLN
1.5000 g | INTRAVENOUS | Status: DC
  Administered 2013-06-27: 1500 mg via INTRAVENOUS
  Administered 2013-06-27: 750 mg via INTRAVENOUS
  Filled 2013-06-27: qty 1.5

## 2013-06-27 MED ORDER — FAMOTIDINE IN NACL 20-0.9 MG/50ML-% IV SOLN
20.0000 mg | Freq: Two times a day (BID) | INTRAVENOUS | Status: AC
  Administered 2013-06-27 (×2): 20 mg via INTRAVENOUS
  Filled 2013-06-27: qty 50

## 2013-06-27 MED ORDER — CALCIUM CHLORIDE 10 % IV SOLN
INTRAVENOUS | Status: DC | PRN
  Administered 2013-06-27: 300 mg via INTRAVENOUS

## 2013-06-27 MED ORDER — MIDAZOLAM HCL 2 MG/2ML IJ SOLN: 2.0000 mg | INTRAMUSCULAR | Status: DC | PRN

## 2013-06-27 MED ORDER — SODIUM CHLORIDE 0.9 % IJ SOLN: 3.0000 mL | Freq: Two times a day (BID) | INTRAMUSCULAR | Status: DC

## 2013-06-27 MED ORDER — POTASSIUM CHLORIDE 10 MEQ/50ML IV SOLN: 10.0000 meq | INTRAVENOUS | Status: AC

## 2013-06-27 MED ORDER — ONDANSETRON HCL 4 MG/2ML IJ SOLN: 4.0000 mg | Freq: Four times a day (QID) | INTRAMUSCULAR | Status: DC | PRN

## 2013-06-27 MED ORDER — SODIUM CHLORIDE 0.9 % IV SOLN: 250.0000 mL | INTRAVENOUS | Status: DC | PRN

## 2013-06-27 MED ORDER — DOPAMINE-DEXTROSE 3.2-5 MG/ML-% IV SOLN
INTRAVENOUS | Status: DC | PRN
  Administered 2013-06-27: 2 ug/kg/min via INTRAVENOUS

## 2013-06-27 MED ORDER — PANTOPRAZOLE SODIUM 40 MG PO TBEC
40.0000 mg | DELAYED_RELEASE_TABLET | Freq: Every day | ORAL | Status: DC
  Administered 2013-06-29 – 2013-07-03 (×5): 40 mg via ORAL
  Filled 2013-06-27 (×5): qty 1

## 2013-06-27 MED ORDER — ASPIRIN EC 325 MG PO TBEC
325.0000 mg | DELAYED_RELEASE_TABLET | Freq: Every day | ORAL | Status: DC
Start: 2013-06-28 — End: 2013-06-28
  Filled 2013-06-27: qty 1

## 2013-06-27 MED ORDER — CALCIUM CHLORIDE 10 % IV SOLN
1.0000 g | Freq: Once | INTRAVENOUS | Status: AC
  Administered 2013-06-27: 1 g via INTRAVENOUS

## 2013-06-27 MED ORDER — DIAZEPAM 5 MG PO TABS
5.0000 mg | ORAL_TABLET | ORAL | Status: AC
  Administered 2013-06-27: 5 mg via ORAL
  Filled 2013-06-27: qty 1

## 2013-06-27 MED ORDER — ALBUMIN HUMAN 5 % IV SOLN
INTRAVENOUS | Status: DC | PRN
  Administered 2013-06-27: 11:00:00 via INTRAVENOUS

## 2013-06-27 MED ORDER — PROTAMINE SULFATE 10 MG/ML IV SOLN
INTRAVENOUS | Status: DC | PRN
  Administered 2013-06-27: 50 mg via INTRAVENOUS
  Administered 2013-06-27: 10 mg via INTRAVENOUS
  Administered 2013-06-27 (×2): 50 mg via INTRAVENOUS
  Administered 2013-06-27: 30 mg via INTRAVENOUS
  Administered 2013-06-27 (×3): 10 mg via INTRAVENOUS
  Administered 2013-06-27: 50 mg via INTRAVENOUS

## 2013-06-27 MED ORDER — PLASMA-LYTE 148 IV SOLN
INTRAVENOUS | Status: DC
  Filled 2013-06-27: qty 2.5

## 2013-06-27 MED ORDER — VANCOMYCIN HCL 10 G IV SOLR
1250.0000 mg | INTRAVENOUS | Status: DC
  Administered 2013-06-27: 1250 mg via INTRAVENOUS
  Filled 2013-06-27: qty 1250

## 2013-06-27 MED ORDER — POTASSIUM CHLORIDE 2 MEQ/ML IV SOLN
80.0000 meq | INTRAVENOUS | Status: DC
  Filled 2013-06-27: qty 40

## 2013-06-27 MED ORDER — SODIUM CHLORIDE 0.9 % IV SOLN
INTRAVENOUS | Status: DC
  Filled 2013-06-27: qty 40

## 2013-06-27 MED ORDER — ALBUMIN HUMAN 5 % IV SOLN
250.0000 mL | INTRAVENOUS | Status: AC | PRN
  Administered 2013-06-27 (×3): 250 mL via INTRAVENOUS
  Filled 2013-06-27: qty 250

## 2013-06-27 MED ORDER — ASPIRIN 81 MG PO CHEW
81.0000 mg | CHEWABLE_TABLET | ORAL | Status: AC
  Administered 2013-06-27: 81 mg via ORAL
  Filled 2013-06-27: qty 1

## 2013-06-27 MED ORDER — POLYVINYL ALCOHOL 1.4 % OP SOLN: 1.0000 [drp] | OPHTHALMIC | Status: DC | PRN

## 2013-06-27 MED ORDER — PHENYLEPHRINE HCL 10 MG/ML IJ SOLN
0.0000 ug/min | INTRAVENOUS | Status: DC
  Administered 2013-06-28: 15 ug/min via INTRAVENOUS
  Filled 2013-06-27 (×2): qty 2

## 2013-06-27 MED ORDER — FENTANYL CITRATE 0.05 MG/ML IJ SOLN
INTRAMUSCULAR | Status: AC
  Filled 2013-06-27: qty 2

## 2013-06-27 MED ORDER — NITROGLYCERIN 0.2 MG/ML ON CALL CATH LAB
INTRAVENOUS | Status: AC
  Filled 2013-06-27: qty 1

## 2013-06-27 MED ORDER — ACETAMINOPHEN 160 MG/5ML PO SOLN
1000.0000 mg | Freq: Four times a day (QID) | ORAL | Status: AC
  Administered 2013-06-28: 1000 mg
  Filled 2013-06-27: qty 40.6

## 2013-06-27 MED ORDER — FENTANYL CITRATE 0.05 MG/ML IJ SOLN
INTRAMUSCULAR | Status: DC | PRN
  Administered 2013-06-27: 50 ug via INTRAVENOUS
  Administered 2013-06-27: 150 ug via INTRAVENOUS
  Administered 2013-06-27: 50 ug via INTRAVENOUS
  Administered 2013-06-27: 100 ug via INTRAVENOUS
  Administered 2013-06-27: 50 ug via INTRAVENOUS
  Administered 2013-06-27: 150 ug via INTRAVENOUS
  Administered 2013-06-27: 50 ug via INTRAVENOUS
  Administered 2013-06-27: 200 ug via INTRAVENOUS
  Administered 2013-06-27 (×2): 150 ug via INTRAVENOUS
  Administered 2013-06-27: 250 ug via INTRAVENOUS
  Administered 2013-06-27: 100 ug via INTRAVENOUS

## 2013-06-27 MED ORDER — DOPAMINE-DEXTROSE 3.2-5 MG/ML-% IV SOLN: 0.0000 ug/kg/min | INTRAVENOUS | Status: DC

## 2013-06-27 MED ORDER — FOLIC ACID 1 MG PO TABS
1000.0000 ug | ORAL_TABLET | Freq: Every day | ORAL | Status: DC
  Filled 2013-06-27: qty 1

## 2013-06-27 MED ORDER — NOREPINEPHRINE BITARTRATE 1 MG/ML IJ SOLN
INTRAMUSCULAR | Status: AC
  Filled 2013-06-27: qty 4

## 2013-06-27 MED ORDER — PHENYLEPHRINE HCL 10 MG/ML IJ SOLN
30.0000 ug/min | INTRAVENOUS | Status: DC
  Administered 2013-06-27: 20 ug/min via INTRAVENOUS
  Filled 2013-06-27: qty 2

## 2013-06-27 MED ORDER — MORPHINE SULFATE 2 MG/ML IJ SOLN
2.0000 mg | INTRAMUSCULAR | Status: DC | PRN
  Administered 2013-06-27 – 2013-06-28 (×4): 2 mg via INTRAVENOUS
  Filled 2013-06-27 (×3): qty 1

## 2013-06-27 MED ORDER — SODIUM CHLORIDE 0.9 % IJ SOLN
OROMUCOSAL | Status: DC | PRN
  Administered 2013-06-27: 11:00:00 via TOPICAL

## 2013-06-27 MED ORDER — 0.9 % SODIUM CHLORIDE (POUR BTL) OPTIME
TOPICAL | Status: DC | PRN
  Administered 2013-06-27: 6000 mL

## 2013-06-27 MED ORDER — ARTIFICIAL TEARS OP OINT
TOPICAL_OINTMENT | OPHTHALMIC | Status: DC | PRN
  Administered 2013-06-27: 1 via OPHTHALMIC

## 2013-06-27 MED ORDER — PLASMA-LYTE 148 IV SOLN
INTRAVENOUS | Status: DC | PRN
  Administered 2013-06-27: 11:00:00 via INTRAVASCULAR

## 2013-06-27 MED ORDER — VERAPAMIL HCL 2.5 MG/ML IV SOLN
INTRAVENOUS | Status: AC
  Filled 2013-06-27: qty 2

## 2013-06-27 SURGICAL SUPPLY — 113 items
ADAPTER CARDIO PERF ANTE/RETRO (ADAPTER) ×5 IMPLANT
ATTRACTOMAT 16X20 MAGNETIC DRP (DRAPES) ×5 IMPLANT
BAG DECANTER FOR FLEXI CONT (MISCELLANEOUS) ×5 IMPLANT
BANDAGE ELASTIC 4 VELCRO ST LF (GAUZE/BANDAGES/DRESSINGS) ×5 IMPLANT
BANDAGE ELASTIC 6 VELCRO ST LF (GAUZE/BANDAGES/DRESSINGS) ×5 IMPLANT
BANDAGE GAUZE ELAST BULKY 4 IN (GAUZE/BANDAGES/DRESSINGS) ×5 IMPLANT
BASKET HEART  (ORDER IN 25'S) (MISCELLANEOUS) ×1
BASKET HEART (ORDER IN 25'S) (MISCELLANEOUS) ×1
BASKET HEART (ORDER IN 25S) (MISCELLANEOUS) ×3 IMPLANT
BENZOIN TINCTURE PRP APPL 2/3 (GAUZE/BANDAGES/DRESSINGS) ×5 IMPLANT
BLADE STERNUM SYSTEM 6 (BLADE) ×5 IMPLANT
BLADE SURG 11 STRL SS (BLADE) ×5 IMPLANT
BLADE SURG 12 STRL SS (BLADE) ×5 IMPLANT
BLADE SURG ROTATE 9660 (MISCELLANEOUS) ×15 IMPLANT
CANISTER SUCTION 2500CC (MISCELLANEOUS) ×5 IMPLANT
CANNULA AORTIC HI-FLOW 6.5M20F (CANNULA) ×5 IMPLANT
CANNULA ARTERIAL NVNT 3/8 20FR (MISCELLANEOUS) ×5 IMPLANT
CANNULA GUNDRY RCSP 15FR (MISCELLANEOUS) ×10 IMPLANT
CANNULA VENOUS LOW PROF 32X40 (CANNULA) ×5 IMPLANT
CANNULA VENOUS LOW PROF 34X46 (CANNULA) ×5 IMPLANT
CANNULA VENOUS MAL SGL STG 40 (MISCELLANEOUS) IMPLANT
CANNULA VESSEL 3MM BLUNT TIP (CANNULA) ×15 IMPLANT
CANNULAE VENOUS MAL SGL STG 40 (MISCELLANEOUS)
CATH CPB KIT VANTRIGT (MISCELLANEOUS) ×5 IMPLANT
CATH ROBINSON RED A/P 18FR (CATHETERS) ×25 IMPLANT
CATH THORACIC 36FR RT ANG (CATHETERS) ×10 IMPLANT
CLIP FOGARTY SPRING 6M (CLIP) ×5 IMPLANT
CLIP TI WIDE RED SMALL 24 (CLIP) ×5 IMPLANT
CLOSURE STERI-STRIP 1/2X4 (GAUZE/BANDAGES/DRESSINGS) ×1
CLOSURE WOUND 1/2 X4 (GAUZE/BANDAGES/DRESSINGS) ×1
CLSR STERI-STRIP ANTIMIC 1/2X4 (GAUZE/BANDAGES/DRESSINGS) ×4 IMPLANT
COVER SURGICAL LIGHT HANDLE (MISCELLANEOUS) ×5 IMPLANT
CRADLE DONUT ADULT HEAD (MISCELLANEOUS) ×5 IMPLANT
DRAIN CHANNEL 32F RND 10.7 FF (WOUND CARE) ×5 IMPLANT
DRAPE CARDIOVASCULAR INCISE (DRAPES) ×2
DRAPE SLUSH/WARMER DISC (DRAPES) ×5 IMPLANT
DRAPE SRG 135X102X78XABS (DRAPES) ×3 IMPLANT
DRSG AQUACEL AG ADV 3.5X14 (GAUZE/BANDAGES/DRESSINGS) ×5 IMPLANT
ELECT BLADE 4.0 EZ CLEAN MEGAD (MISCELLANEOUS) ×5
ELECT BLADE 6.5 EXT (BLADE) ×5 IMPLANT
ELECT CAUTERY BLADE 6.4 (BLADE) ×5 IMPLANT
ELECT REM PT RETURN 9FT ADLT (ELECTROSURGICAL) ×10
ELECTRODE BLDE 4.0 EZ CLN MEGD (MISCELLANEOUS) ×3 IMPLANT
ELECTRODE REM PT RTRN 9FT ADLT (ELECTROSURGICAL) ×6 IMPLANT
FLOSEAL 10ML (HEMOSTASIS) ×5 IMPLANT
GLOVE BIO SURGEON STRL SZ 6 (GLOVE) ×20 IMPLANT
GLOVE BIO SURGEON STRL SZ 6.5 (GLOVE) ×20 IMPLANT
GLOVE BIO SURGEON STRL SZ7 (GLOVE) ×5 IMPLANT
GLOVE BIO SURGEON STRL SZ7.5 (GLOVE) ×15 IMPLANT
GLOVE BIO SURGEONS STRL SZ 6.5 (GLOVE) ×5
GLOVE BIOGEL PI IND STRL 6 (GLOVE) ×9 IMPLANT
GLOVE BIOGEL PI INDICATOR 6 (GLOVE) ×6
GOWN STRL NON-REIN LRG LVL3 (GOWN DISPOSABLE) ×30 IMPLANT
HEMOSTAT POWDER SURGIFOAM 1G (HEMOSTASIS) ×15 IMPLANT
HEMOSTAT SURGICEL 2X14 (HEMOSTASIS) ×5 IMPLANT
INSERT FOGARTY XLG (MISCELLANEOUS) IMPLANT
KIT BASIN OR (CUSTOM PROCEDURE TRAY) ×5 IMPLANT
KIT ROOM TURNOVER OR (KITS) ×5 IMPLANT
KIT SUCTION CATH 14FR (SUCTIONS) ×5 IMPLANT
KIT TOURNIQUET VASCULAR (MISCELLANEOUS) ×5 IMPLANT
KIT VASOVIEW W/TROCAR VH 2000 (KITS) ×5 IMPLANT
LEAD PACING MYOCARDI (MISCELLANEOUS) ×5 IMPLANT
MARKER GRAFT CORONARY BYPASS (MISCELLANEOUS) ×15 IMPLANT
MATRIX HEMOSTAT SURGIFLO (HEMOSTASIS) ×5 IMPLANT
NS IRRIG 1000ML POUR BTL (IV SOLUTION) ×30 IMPLANT
PACK OPEN HEART (CUSTOM PROCEDURE TRAY) ×5 IMPLANT
PAD ARMBOARD 7.5X6 YLW CONV (MISCELLANEOUS) ×10 IMPLANT
PAD ELECT DEFIB RADIOL ZOLL (MISCELLANEOUS) ×5 IMPLANT
PENCIL BUTTON HOLSTER BLD 10FT (ELECTRODE) ×5 IMPLANT
PUNCH AORTIC ROTATE 4.0MM (MISCELLANEOUS) IMPLANT
PUNCH AORTIC ROTATE 4.5MM 8IN (MISCELLANEOUS) ×5 IMPLANT
PUNCH AORTIC ROTATE 5MM 8IN (MISCELLANEOUS) IMPLANT
SPONGE GAUZE 4X4 12PLY (GAUZE/BANDAGES/DRESSINGS) ×10 IMPLANT
SPONGE LAP 18X18 X RAY DECT (DISPOSABLE) ×10 IMPLANT
SPONGE LAP 4X18 X RAY DECT (DISPOSABLE) ×5 IMPLANT
STRIP CLOSURE SKIN 1/2X4 (GAUZE/BANDAGES/DRESSINGS) ×4 IMPLANT
SUT BONE WAX W31G (SUTURE) ×5 IMPLANT
SUT ETHILON 3 0 FSL (SUTURE) ×5 IMPLANT
SUT MNCRL AB 4-0 PS2 18 (SUTURE) ×5 IMPLANT
SUT PROLENE 3 0 SH DA (SUTURE) IMPLANT
SUT PROLENE 3 0 SH1 36 (SUTURE) IMPLANT
SUT PROLENE 4 0 RB 1 (SUTURE) ×6
SUT PROLENE 4 0 SH DA (SUTURE) ×10 IMPLANT
SUT PROLENE 4 0 TF (SUTURE) ×10 IMPLANT
SUT PROLENE 4-0 RB1 .5 CRCL 36 (SUTURE) ×9 IMPLANT
SUT PROLENE 5 0 C 1 36 (SUTURE) IMPLANT
SUT PROLENE 6 0 C 1 30 (SUTURE) IMPLANT
SUT PROLENE 6 0 CC (SUTURE) ×35 IMPLANT
SUT PROLENE 7 0 DA (SUTURE) IMPLANT
SUT PROLENE 8 0 BV175 6 (SUTURE) IMPLANT
SUT PROLENE BLUE 7 0 (SUTURE) ×10 IMPLANT
SUT PROLENE POLY MONO (SUTURE) ×10 IMPLANT
SUT SILK  1 MH (SUTURE)
SUT SILK 1 MH (SUTURE) IMPLANT
SUT SILK 2 0 SH CR/8 (SUTURE) ×5 IMPLANT
SUT SILK 3 0 SH CR/8 (SUTURE) ×5 IMPLANT
SUT STEEL 6MS V (SUTURE) ×10 IMPLANT
SUT STEEL SZ 6 DBL 3X14 BALL (SUTURE) ×5 IMPLANT
SUT VIC AB 1 CTX 36 (SUTURE) ×4
SUT VIC AB 1 CTX36XBRD ANBCTR (SUTURE) ×6 IMPLANT
SUT VIC AB 2-0 CT1 27 (SUTURE) ×2
SUT VIC AB 2-0 CT1 TAPERPNT 27 (SUTURE) ×3 IMPLANT
SUT VIC AB 2-0 CTX 27 (SUTURE) IMPLANT
SUT VIC AB 3-0 X1 27 (SUTURE) IMPLANT
SUTURE E-PAK OPEN HEART (SUTURE) ×5 IMPLANT
SYSTEM SAHARA CHEST DRAIN ATS (WOUND CARE) ×5 IMPLANT
TAPE CLOTH SURG 4X10 WHT LF (GAUZE/BANDAGES/DRESSINGS) ×5 IMPLANT
TOWEL OR 17X24 6PK STRL BLUE (TOWEL DISPOSABLE) ×10 IMPLANT
TOWEL OR 17X26 10 PK STRL BLUE (TOWEL DISPOSABLE) ×10 IMPLANT
TRAY FOLEY IC TEMP SENS 14FR (CATHETERS) ×5 IMPLANT
TUBING INSUFFLATION 10FT LAP (TUBING) ×5 IMPLANT
UNDERPAD 30X30 INCONTINENT (UNDERPADS AND DIAPERS) ×5 IMPLANT
WATER STERILE IRR 1000ML POUR (IV SOLUTION) ×10 IMPLANT

## 2013-06-27 NOTE — Preoperative (Signed)
Beta Blockers   Reason not to administer Beta Blockers:Not Applicable 

## 2013-06-27 NOTE — Brief Op Note (Signed)
06/27/2013  1:35 PM  PATIENT:  Tyler [name]  79 y.o. male  PRE-OPERATIVE DIAGNOSIS:  left main  POST-OPERATIVE DIAGNOSIS:  left main coronary artery disease  PROCEDURE:  Procedure(s) with comments:  CORONARY ARTERY BYPASS GRAFTING x 4 -LIMA to LAD -SVG to OM 1 -SEQUENTIAL SVG TO PLB,PDA  ENDOVEIN HARVEST OF GREATER SAPHENOUS VEIN (Right)  TRANSESOPHAGEAL ECHOCARDIOGRAM (TEE) (N/A)  SURGEON:  Surgeon(s) and Role:    * Kerin Perna, MD - Primary  PHYSICIAN ASSISTANT: Lowella Dandy PA-C, Coral Ceo PA-C  ANESTHESIA:   general  EBL:  Total I/O In: 250 [IV Piggyback:250] Out: 1200 [Urine:1200]  BLOOD ADMINISTERED: PRBC,  CELLSAVER, 1 unit FFP and 2 units PLTS  DRAINS: Left Pleural Chest Tube, Mediastinal Chest drains   LOCAL MEDICATIONS USED:  NONE  SPECIMEN:  No Specimen  DISPOSITION OF SPECIMEN:  N/A  COUNTS:  YES  TOURNIQUET:  * No tourniquets in log *  DICTATION: .Dragon Dictation  PLAN OF CARE: Admit to inpatient   PATIENT DISPOSITION:  ICU - intubated and hemodynamically stable.   Delay start of Pharmacological VTE agent (>24hrs) due to surgical blood loss or risk of bleeding: yes

## 2013-06-27 NOTE — Progress Notes (Signed)
I was called to go to cath lab by Charge RT to s/u vent.  Once in cath lab, noted pt to be already intubated and being bagged by CRNA.  Pt was being urgently prepped to go to OR, placed pt on vent for approx 5 minutes (on vent settings charted)  Then CRNA bagged pt on 100% fio2 to OR.

## 2013-06-27 NOTE — H&P (Signed)
Abron Neddo is a 79 y.o. male  Admit Date: 06/27/2013 Referring Physician: Cherrie Gauze Leia Alf, M.D. Primary Cardiologist:: H. WB Leia Alf, M.D. Chief complaint / reason for admission: Angina pectoris with minimal exertion  HPI: Mr. Retter is an 79 year old gentleman with a history of progressive exertional chest discomfort over 3 months. When he presented to the office 72 hours ago he complained that he could barely walk to his mailbox at the end of the driveway without chest discomfort. Episodes have not occurred at rest but the duration of the episodes were increasing. There was associated dyspnea. An EKG performed at that time demonstrated new anterior T-wave inversions when compared to the tracing from last year. He was totally pain-free in the office on the day of evaluation. Because of the high likelihood of progression of coronary disease we discussed we discussed proceeding to coronary angiography rather than stress testing. We also discussed whether he would do it here in [place] or at his residence in [place].    PMH:    Past Medical History  Diagnosis Date  . Coronary atherosclerosis   . Osteoarthritis   . Hypertension   . Hyperlipidemia   . Erectile dysfunction   . CVA (cerebral infarction)     Past Surgical History: none  ALLERGIES:   Review of patient's allergies indicates no known allergies.  Prior to Admit Meds:   Prescriptions prior to admission  Medication Sig Dispense Refill  . aspirin 81 MG tablet Take 81 mg by mouth daily.      . Cholecalciferol (VITAMIN D-3) 1000 UNITS CAPS Take 1 capsule by mouth daily.       . clopidogrel (PLAVIX) 75 MG tablet Take 75 mg by mouth daily with breakfast.      . folic acid (FOLVITE) 800 MCG tablet Take 800 mcg by mouth daily.       . metoprolol tartrate (LOPRESSOR) 25 MG tablet Take 25 mg by mouth 2 (two) times daily.      . Multiple Vitamin (MULTIVITAMIN) tablet Take 1 tablet by mouth daily.      .  nitroGLYCERIN (NITROSTAT) 0.4 MG SL tablet Place 0.4 mg under the tongue every 5 (five) minutes as needed for chest pain.      . Omega-3 Fatty Acids (FISH OIL PO) Take 100 Units by mouth daily.       . pravastatin (PRAVACHOL) 40 MG tablet Take 40 mg by mouth at bedtime.       [name] terazosin (HYTRIN) 5 MG capsule Take 5 mg by mouth at bedtime.       Family HX:    Family History  Problem Relation Age of Onset  . Heart disease Sister   . Heart disease Brother    Social HX:    History   Social History  . Marital Status: Married    Spouse Name: N/A    Number of Children: N/A  . Years of Education: N/A   Occupational History  . Not on file.   Social History Main Topics  . Smoking status: Never Smoker   . Smokeless tobacco: Never Used  . Alcohol Use: Yes     Comment: Rare  . Drug Use: No  . Sexual Activity: Not on file   Other Topics Concern  . Not on file   Social History Narrative  . No narrative on file     ROS: No new neurological complaints. DOE increasing. Denies GI bleeding. No history of syncope.  Physical Exam: Blood pressure 106/64, pulse 61, temperature 97.6 F (36.4 C), temperature source Oral, resp. rate 18, height 5\' 9"  (1.753 m), weight 216 lb (97.977 kg), SpO2 98.00%.    somewhat pale appearing but in no distress HEENT exam unremarkable Neck exam with no JVD or carotid bruits Chest is clear to auscultation and percussion Cardiac exam reveals an S4 gallop. Abdomen soft with normal bowel sounds. No bruits. Extremities reveal no edema. Ulcer left lower extremity  Neurological exam is grossly unremarkable  Labs: Lab Results  Component Value Date   WBC 8.4 06/24/2013   HGB 11.4* 06/24/2013   HCT 34.6* 06/24/2013   MCV 84.9 06/24/2013   PLT 163.0 06/24/2013    Recent Labs Lab 06/24/13 1233  NA 141  K 4.6  CL 106  CO2 27  BUN 19  CREATININE 1.2  CALCIUM 8.9  GLUCOSE 93     Radiology:  Chest x-ray on 06/24/13 revealed no acute disease process    EKG:  ECG done on the office visit on 06/24/13 demonstrated new anterior T-wave inversions. Normal sinus rhythm was noted.  ASSESSMENT:  1. Class III angina pectoris, progressive over the past 3 months 2. Hypertension 3. History of CVA 2-1/2 years ago without recurrent symptoms 4. Hyperlipidemia  Plan:  The patient is exhibiting symptoms of severe angina. These symptoms are relatively new over the past 3 months and progressive. I have counseled the patient undergo diagnostic cardiac catheterization his symptoms we can arrange it. He understands other risks would be the possibility of death, stroke, myocardial infarction, kidney injury, allergy, bleeding, infection, among other significant complications at his age. The risk of stroke is particularly concerning given his relatively new stroke 2 years ago. The patient understands these risks and is willing to proceed to identify the source of his complaints.  We did have some discussion about whether to do the procedure here or in [facility] where he lives 10-11 months of the year. He preferred to have the procedure done here as both of his daughters lives in [place]. Lesleigh Noe 06/27/2013 10:09 AM

## 2013-06-27 NOTE — H&P (View-Only) (Signed)
Patient ID: Tyler [name], male   DOB: 04/06/1929, 79 y.o.   MRN: [id number]    [address] [place], [place]  [address] Phone: [phone] Fax:  [phone]  Date:  06/24/2013   ID:  Tyler [name], DOB 01/08/1929, MRN [id number]  PCP:  No primary provider on file.   ASSESSMENT:  1. Coronary artery disease with nonobstructive CAD by cath in 2000. Now with ECG abnormality and abnormal ECG 2. Hypertension 3. Hyperlipidemia 4. CVA  PLAN:  1. NTG SL 2. Diagnostic coronary angiography. The procedure and risks of stroke, death, myocardial infarction, allergy, kidney injury, limb ischemia, among others were discussed in detail and except above the patient.   SUBJECTIVE: Tyler [name] is a 79 y.o. male who has developed chest discomfort on exertion since last office visit in December 2013. Walking relatively short distances, particularly after eating brings on tightness in the chest. It takes up to 10 minutes to resolve. Today's EKG reveals biphasic anterior T-wave abnormality with poor R-wave progression. He denies discomfort at rest. He is having some difficulty with swallowing dry foods, which tend to stick. He has to get up to go down with warm liquids. He denies orthopnea, PND, radiation, diaphoresis, and other complaints.   Wt Readings from Last 3 Encounters:  06/24/13 216 lb (97.977 kg)     Past Medical History  Diagnosis Date  . Coronary atherosclerosis   . Osteoarthritis   . Hypertension   . Hyperlipidemia   . Erectile dysfunction   . CVA (cerebral infarction)     Current Outpatient Prescriptions  Medication Sig Dispense Refill  . aspirin 81 MG tablet Take 81 mg by mouth daily.      . Cholecalciferol (VITAMIN D-3) 1000 UNITS CAPS Take by mouth daily.      . clopidogrel (PLAVIX) 75 MG tablet Take 75 mg by mouth daily with breakfast.      . famotidine (PEPCID) 20 MG tablet Take 20 mg by mouth daily.      . folic acid (FOLVITE) 800 MCG tablet Take 400 mcg  by mouth daily.      . hydrocodone-acetaminophen (LORCET-HD) 5-500 MG per capsule Take 1 capsule by mouth every 6 (six) hours as needed for pain.      . meloxicam (MOBIC) 7.5 MG tablet Take 7.5 mg by mouth as needed for pain.      . metoprolol tartrate (LOPRESSOR) 25 MG tablet Take 25 mg by mouth 2 (two) times daily.      . Omega-3 Fatty Acids (FISH OIL PO) Take 360 Units by mouth daily.      . pravastatin (PRAVACHOL) 40 MG tablet Take 40 mg by mouth daily.      . terazosin (HYTRIN) 5 MG capsule Take 5 mg by mouth at bedtime.       No current facility-administered medications for this visit.    Allergies:   No Known Allergies  Social History:  The patient  reports that he has never smoked. He has never used smokeless tobacco. He reports that he drinks alcohol. He reports that he does not use illicit drugs.   ROS:  Please see the history of present illness.   With prior history of CVA. CV occurred in 2012. He denies lung disease, orthopnea, palpitations, edema, syncope, claudication, and diabetes.   All other systems reviewed and negative.   OBJECTIVE: VS:  Ht 5' 9" (1.753 m)  Wt 216 lb (97.977 kg)  BMI 31.88 kg/m2 Well nourished, well developed,   in no acute distress, elderly HEENT: normal Neck: JVD flat. Carotid bruit absent  Cardiac:  normal S1, S2; RRR; no murmur Lungs:  clear to auscultation bilaterally, no wheezing, rhonchi or rales Abd: soft, nontender, no hepatomegaly Ext: Edema  Trace to 1+ bilateral . Pulses 2+ and symmetric. Both lower extremities have healing lesions from skin cancer removal.  Skin: warm and dry Neuro:  CNs 2-12 intact, no focal abnormalities noted  EKG:  Normal sinus rhythm with poor R-wave progression and anterior T-wave abnormality suggestive of ischemia.       Signed, Henry W. B. Smith III, MD 06/24/2013 10:31 AM   

## 2013-06-27 NOTE — CV Procedure (Signed)
9fr sheath aspirated and removed from right radial artery. Tr band applied per protocol. 12cc air at 16:55. Spo2 measured in right thumb 95%. Dr. Alla German suggested a longer tr band time due to heparin, platelets etc. Dr. Alla German and i discussed a 90 minute time before deflation and he agreed. Advised the nurse to call the cathlab and or [id number] if assistance is needed.

## 2013-06-27 NOTE — Progress Notes (Signed)
TR band removed following protocol with no complications.  Starting at 1900, 3 cc removed from band every 15 minutes. VS documented and O2 sats in high 90's.  Will continue to monitor.

## 2013-06-27 NOTE — Progress Notes (Signed)
Echo Lab  OR TEE completed.  Shalva Rozycki L Luciana Cammarata, RDCS 06/27/2013 11:29 AM

## 2013-06-27 NOTE — Progress Notes (Signed)
Chaplain met pt. Family in cath. Lab. Pt. Was scheduled for a routine procedure. During procedure pt.coded and was taken for emergency surgery. Chaplain provided spiritual care, prayer and listening ear to wife and daughters. Chaplain remained with family during doctors consult. Chaplain escorted family to surgical waiting room and advised if any further assistance was needed to please ask the receptionist to page chaplain. Pt. Will be admitted,possible follow up to family in a day or so is needed by chaplain staff  Cindie Crumbly, Chaplain       06/27/13 1100  Clinical Encounter Type  Visited With Family  Visit Type Initial  Referral From Nurse  Consult/Referral To Chaplain  Spiritual Encounters  Spiritual Needs Emotional;Prayer  Stress Factors  Patient Stress Factors None identified  Family Stress Factors None identified

## 2013-06-27 NOTE — CV Procedure (Addendum)
     Left Heart Catheterization with Coronary Angiography and emergency IABP Report  Kaliel Bolds  79 y.o.  male [birth year]  Procedure Date: 06/27/2013  Referring Physician: HWBSmith, III Primary Cardiologist:: HWBSmith, iii  INDICATIONS: Class III angina with new T-wave changes on EKG  PROCEDURE: 1. Left heart catheterization; 2. Selective coronary angiography; 3. Left ventriculography by hand injection; 4. Emergency intra-aortic balloon pump  CONSENT:  The risks, benefits, and details of the procedure were explained in detail to the patient. Risks including death, stroke, heart attack, kidney injury, allergy, limb ischemia, bleeding and radiation injury were discussed.  The patient verbalized understanding and wanted to proceed.  Informed written consent was obtained.  PROCEDURE TECHNIQUE:  After Xylocaine anesthesia a 5 French slender sheath was placed in the right radial artery with a single anterior needle wall stick.  Coronary angiography was done using a 5 [place] JR 4, 3.5 cm JL, and 3.5 cm 5 [place] EBU catheter.  Left ventriculography was done using the 5 French Judkins right catheter and hand injection.   The initial shot of the left coronary demonstrated pressure damping on engagement in absence of contrast refluxed into the ascending aorta. Severe left coronary disease and ostial disease was noted. The catheter was disengaged. The 3.5 cm 5 [place] EBU was then used to obtain a flush shots of the left main demonstrating a severe ulcerated plaque in the ostium/proximal left main. Coronary artery bypass surgery was felt to be indicated and the cardiac surgeons were called promptly from the cath lab. The patient remained stable for 10-15 minutes post final angiogram. He then began to experience mild chest discomfort along with significant ST segment depression.  Because of this and the demonstrated anatomy we placed an intra-aortic balloon pump emergently. During the insertion of the  balloon pump the patient developed shock and unresponsiveness. CPR was performed for approximately 60 seconds. And IV Levophed drip was initially considered but never started. Anesthesia and cardiothoracic surgery arrived shortly. The patient was intubated. His blood pressure became much more stable. He was sedated. He did have movement of his arms and legs.  MEDICATIONS: Versed, fentanyl, and IV heparin 7000 units  CONTRAST:  Total of 100 cc.  COMPLICATIONS:  Pump failure with CPR required   HEMODYNAMICS:  Aortic pressure 110/58; LV pressure 110/14; LVEDP 22,  ANGIOGRAPHIC DATA:     The left main coronary artery is 95% calcified proximal to ostial left main stenosis..  The left anterior descending artery is 80-90% mid LAD after the first septal perforator.  The left circumflex artery is proximal 80-90% obstruction.  The right coronary artery is ostial 70% with a large distal PDA and 99% stenosis proximal to the origin of a large left ventricular. The patient is right dominant.[name]   LEFT VENTRICULOGRAM:  Left ventricular angiogram was done in the 30 RAO projection and revealed inferoapical hypokinesis. EF 50%   IMPRESSIONS:  1. Severe three-vessel coronary disease including ostial left main demonstrated by catheterization  2. Clinical evidence of left main closure post catheterization exhibited by dramatic ST segment depression on monitor and development of shock.  3. Reversal of shock state with intra-aortic balloon pump, less than 1 minute of CPR, and improved oxygenation. Did not have ventricular arrhythmia.   RECOMMENDATION:  Emergency coronary artery bypass grafting from the Cath Lab straight to the OR.

## 2013-06-27 NOTE — Consult Note (Addendum)
Reason for Consult:left main disease Referring Physician: Dr. Ronna Polio is an 79 y.o. male.  HPI: 79 yo with history of stroke on plavix. Severe angina. Cath today. Critical left main disease. Had arrest after injection requiring CPR. Now intubated with ROSC and IABP in place.    Past Medical History  Diagnosis Date  . Coronary atherosclerosis   . Osteoarthritis   . Hypertension   . Hyperlipidemia   . Erectile dysfunction   . CVA (cerebral infarction)     No past surgical history on file.  Family History  Problem Relation Age of Onset  . Heart disease Sister   . Heart disease Brother     Social History:  reports that he has never smoked. He has never used smokeless tobacco. He reports that he drinks alcohol. He reports that he does not use illicit drugs.  Allergies: No Known Allergies  Medications:  Prior to Admission:  Prescriptions prior to admission  Medication Sig Dispense Refill  . aspirin 81 MG tablet Take 81 mg by mouth daily.      . Cholecalciferol (VITAMIN D-3) 1000 UNITS CAPS Take 1 capsule by mouth daily.       . clopidogrel (PLAVIX) 75 MG tablet Take 75 mg by mouth daily with breakfast.      . folic acid (FOLVITE) 800 MCG tablet Take 800 mcg by mouth daily.       . metoprolol tartrate (LOPRESSOR) 25 MG tablet Take 25 mg by mouth 2 (two) times daily.      . Multiple Vitamin (MULTIVITAMIN) tablet Take 1 tablet by mouth daily.      . nitroGLYCERIN (NITROSTAT) 0.4 MG SL tablet Place 0.4 mg under the tongue every 5 (five) minutes as needed for chest pain.      . Omega-3 Fatty Acids (FISH OIL PO) Take 100 Units by mouth daily.       . pravastatin (PRAVACHOL) 40 MG tablet Take 40 mg by mouth at bedtime.       [name] terazosin (HYTRIN) 5 MG capsule Take 5 mg by mouth at bedtime.        Results for orders placed during the hospital encounter of 06/27/13 (from the past 48 hour(s))  PROTIME-INR     Status: None   Collection Time    06/27/13  7:30 AM   Result Value Range   Prothrombin Time 12.8  11.6 - 15.2 seconds   INR 0.98  0.00 - 1.49    No results found.  Review of Systems  Unable to perform ROS: intubated   Blood pressure 106/64, pulse 61, temperature 97.6 F (36.4 C), temperature source Oral, resp. rate 18, height 5\' 9"  (1.753 m), weight 216 lb (97.977 kg), SpO2 98.00%. Physical Exam  Vitals reviewed. Constitutional:  Intubated, sedated  Cardiovascular: Normal rate and regular rhythm.   IABP in place right femoral artery  Respiratory: Breath sounds normal.    Assessment/Plan: 79 yo male with critical left main disease. VF arrest during cath. Now intubated and relatively stable with IABP in place.  Needs emergent CABG.  I discussed the need for emergent CABG with Mrs. Chalmers. She understands that CABG is only reasonable hope for survival, but is high risk under these circumstances. She understands the risks of death, stroke, MI, bleeding, need for transfusion, infection, DVT/ PE, other organ system failure. She gives consent for emergency CABG.  Dr. Donata Clay is on call and will do CABG, but was tied up in OR. I was available  so I came to facilitate the process. Mrs. Capelle understands Dr. Donata Clay will do procedure.  OR notified and patient en route.   Anndee Connett C 06/27/2013, 10:00 AM

## 2013-06-27 NOTE — Progress Notes (Signed)
Dr Donata Clay at bedside to advance IABP due to x-ray results.  Will continue to monitor.

## 2013-06-27 NOTE — Transfer of Care (Signed)
Immediate Anesthesia Transfer of Care Note  Patient: Tyler [name]  Procedure(s) Performed: Procedure(s) with comments: CORONARY ARTERY BYPASS GRAFTING (CABG) (N/A) - times four utilizing the left internal mammary artery and the right greater saphenous vein harvested endoscopically TRANSESOPHAGEAL ECHOCARDIOGRAM (TEE) (N/A) ENDOVEIN HARVEST OF GREATER SAPHENOUS VEIN (Right)  Patient Location: SICU  Anesthesia Type:General  Level of Consciousness: sedated, unresponsive and Patient remains intubated per anesthesia plan  Airway & Oxygen Therapy: Patient remains intubated per anesthesia plan and Patient placed on Ventilator (see vital sign flow sheet for setting)  Post-op Assessment: Report given to PACU RN and Post -op Vital signs reviewed and stable  Post vital signs: Reviewed and stable  Complications: No apparent anesthesia complications

## 2013-06-27 NOTE — Interval H&P Note (Signed)
History and Physical Interval Note:  06/27/2013 8:41 AM  Tyler [name]  has presented today for surgery, with the diagnosis of cad  The various methods of treatment have been discussed with the patient and family. After consideration of risks, benefits and other options for treatment, the patient has consented to  Procedure(s): LEFT HEART CATHETERIZATION WITH CORONARY ANGIOGRAM (N/A) as a surgical intervention .  The patient's history has been reviewed, patient examined, no change in status, stable for surgery.  I have reviewed the patient's chart and labs.  Questions were answered to the patient's satisfaction.     Tyler [name] W  Please refer to the office note from less than 7 days ago. The patient has been having easily provokable exertional chest discomfort. He has a new anterior T-wave abnormality on his EKG. The studies being done to define anatomy.Cath Lab Visit (complete for each Cath Lab visit)  Clinical Evaluation Leading to the Procedure:   ACS: no  Non-ACS:    Anginal Classification: CCS III  Anti-ischemic medical therapy: Minimal Therapy (1 class of medications)  Non-Invasive Test Results: No non-invasive testing performed  Prior CABG: No previous CABG

## 2013-06-27 NOTE — Anesthesia Preprocedure Evaluation (Addendum)
Anesthesia Evaluation  Patient identified by MRN, date of birth, ID band Patient unresponsive    Reviewed: Unable to perform ROS - Chart review only  Airway       Dental   Pulmonary          Cardiovascular hypertension, + angina + CAD     Neuro/Psych CVA    GI/Hepatic   Endo/Other    Renal/GU      Musculoskeletal   Abdominal   Peds  Hematology   Anesthesia Other Findings Cath Lab arrest. Intubated and sedated.  Reproductive/Obstetrics                          Anesthesia Physical Anesthesia Plan  ASA: IV and emergent  Anesthesia Plan: General   Post-op Pain Management:    Induction: Intravenous  Airway Management Planned: Oral ETT  Additional Equipment: Arterial line, CVP, PA Cath and TEE  Intra-op Plan:   Post-operative Plan: Post-operative intubation/ventilation  Informed Consent:   Plan Discussed with: CRNA, Anesthesiologist and Surgeon  Anesthesia Plan Comments:        Anesthesia Quick Evaluation

## 2013-06-28 ENCOUNTER — Encounter (HOSPITAL_COMMUNITY): Payer: Self-pay | Admitting: Cardiothoracic Surgery

## 2013-06-28 ENCOUNTER — Inpatient Hospital Stay (HOSPITAL_COMMUNITY): Payer: Medicare Other

## 2013-06-28 LAB — PREPARE FRESH FROZEN PLASMA: Unit division: 0

## 2013-06-28 LAB — POCT I-STAT, CHEM 8
Calcium, Ion: 1.15 mmol/L (ref 1.13–1.30)
Chloride: 105 mEq/L (ref 96–112)
Creatinine, Ser: 1.3 mg/dL (ref 0.50–1.35)
Glucose, Bld: 156 mg/dL — ABNORMAL HIGH (ref 70–99)
Hemoglobin: 8.2 g/dL — ABNORMAL LOW (ref 13.0–17.0)
Potassium: 3.5 mEq/L (ref 3.5–5.1)

## 2013-06-28 LAB — CBC
HCT: 24.1 % — ABNORMAL LOW (ref 39.0–52.0)
Hemoglobin: 8.7 g/dL — ABNORMAL LOW (ref 13.0–17.0)
MCH: 28.3 pg (ref 26.0–34.0)
MCH: 28.5 pg (ref 26.0–34.0)
MCHC: 33.6 g/dL (ref 30.0–36.0)
MCV: 84 fL (ref 78.0–100.0)
MCV: 84.9 fL (ref 78.0–100.0)
Platelets: 96 10*3/uL — ABNORMAL LOW (ref 150–400)
RBC: 3.07 MIL/uL — ABNORMAL LOW (ref 4.22–5.81)
RBC: 2.84 MIL/uL — ABNORMAL LOW (ref 4.22–5.81)
RDW: 16.3 % — ABNORMAL HIGH (ref 11.5–15.5)

## 2013-06-28 LAB — PREPARE PLATELET PHERESIS
Unit division: 0
Unit division: 0

## 2013-06-28 LAB — GLUCOSE, CAPILLARY
Glucose-Capillary: 137 mg/dL — ABNORMAL HIGH (ref 70–99)
Glucose-Capillary: 153 mg/dL — ABNORMAL HIGH (ref 70–99)
Glucose-Capillary: 107 mg/dL — ABNORMAL HIGH (ref 70–99)
Glucose-Capillary: 125 mg/dL — ABNORMAL HIGH (ref 70–99)
Glucose-Capillary: 103 mg/dL — ABNORMAL HIGH (ref 70–99)
Glucose-Capillary: 142 mg/dL — ABNORMAL HIGH (ref 70–99)

## 2013-06-28 LAB — POCT I-STAT 3, ART BLOOD GAS (G3+)
Bicarbonate: 20.8 mEq/L (ref 20.0–24.0)
Bicarbonate: 25.1 mEq/L — ABNORMAL HIGH (ref 20.0–24.0)
Patient temperature: 36.6
pCO2 arterial: 40.3 mmHg (ref 35.0–45.0)
pCO2 arterial: 43.5 mmHg (ref 35.0–45.0)
pH, Arterial: 7.319 — ABNORMAL LOW (ref 7.350–7.450)
pH, Arterial: 7.368 (ref 7.350–7.450)
pO2, Arterial: 101 mmHg — ABNORMAL HIGH (ref 80.0–100.0)

## 2013-06-28 LAB — BASIC METABOLIC PANEL
CO2: 24 mEq/L (ref 19–32)
Calcium: 7.8 mg/dL — ABNORMAL LOW (ref 8.4–10.5)
Creatinine, Ser: 1.08 mg/dL (ref 0.50–1.35)
Glucose, Bld: 128 mg/dL — ABNORMAL HIGH (ref 70–99)

## 2013-06-28 LAB — PROTIME-INR
INR: 1.39 (ref 0.00–1.49)
Prothrombin Time: 16.7 seconds — ABNORMAL HIGH (ref 11.6–15.2)

## 2013-06-28 LAB — APTT: aPTT: 34 seconds (ref 24–37)

## 2013-06-28 LAB — POCT ACTIVATED CLOTTING TIME: Activated Clotting Time: 140 seconds

## 2013-06-28 MED ORDER — POTASSIUM CHLORIDE 10 MEQ/50ML IV SOLN
10.0000 meq | Freq: Once | INTRAVENOUS | Status: AC
  Administered 2013-06-28: 10 meq via INTRAVENOUS
  Filled 2013-06-28: qty 50

## 2013-06-28 MED ORDER — INSULIN DETEMIR 100 UNIT/ML ~~LOC~~ SOLN
8.0000 [IU] | Freq: Two times a day (BID) | SUBCUTANEOUS | Status: DC
  Administered 2013-06-28 – 2013-06-30 (×6): 8 [IU] via SUBCUTANEOUS
  Filled 2013-06-28 (×9): qty 0.08

## 2013-06-28 MED ORDER — POTASSIUM CHLORIDE 10 MEQ/50ML IV SOLN
10.0000 meq | INTRAVENOUS | Status: AC
  Administered 2013-06-28 (×3): 10 meq via INTRAVENOUS

## 2013-06-28 MED ORDER — VANCOMYCIN HCL IN DEXTROSE 1-5 GM/200ML-% IV SOLN
1000.0000 mg | Freq: Once | INTRAVENOUS | Status: AC
  Administered 2013-06-28: 1000 mg via INTRAVENOUS
  Filled 2013-06-28: qty 200

## 2013-06-28 MED ORDER — FE FUMARATE-B12-VIT C-FA-IFC PO CAPS
1.0000 | ORAL_CAPSULE | Freq: Two times a day (BID) | ORAL | Status: DC
  Administered 2013-06-28 – 2013-07-03 (×10): 1 via ORAL
  Filled 2013-06-28 (×14): qty 1

## 2013-06-28 MED ORDER — INSULIN ASPART 100 UNIT/ML ~~LOC~~ SOLN
0.0000 [IU] | SUBCUTANEOUS | Status: DC
  Administered 2013-06-28 – 2013-06-29 (×5): 2 [IU] via SUBCUTANEOUS

## 2013-06-28 MED ORDER — CLOPIDOGREL BISULFATE 75 MG PO TABS
75.0000 mg | ORAL_TABLET | Freq: Every day | ORAL | Status: DC
  Administered 2013-06-29 – 2013-07-03 (×5): 75 mg via ORAL
  Filled 2013-06-28 (×7): qty 1

## 2013-06-28 MED ORDER — SODIUM BICARBONATE 8.4 % IV SOLN
50.0000 meq | Freq: Once | INTRAVENOUS | Status: AC
  Administered 2013-06-28: 50 meq via INTRAVENOUS
  Filled 2013-06-28: qty 50

## 2013-06-28 MED ORDER — ASPIRIN EC 81 MG PO TBEC
81.0000 mg | DELAYED_RELEASE_TABLET | Freq: Every day | ORAL | Status: DC
  Administered 2013-06-29 – 2013-07-03 (×5): 81 mg via ORAL
  Filled 2013-06-28 (×6): qty 1

## 2013-06-28 MED ORDER — TRAMADOL HCL 50 MG PO TABS
50.0000 mg | ORAL_TABLET | Freq: Four times a day (QID) | ORAL | Status: DC | PRN
  Administered 2013-07-03: 50 mg via ORAL
  Filled 2013-06-28: qty 1

## 2013-06-28 MED FILL — Electrolyte-R (PH 7.4) Solution: INTRAVENOUS | Qty: 6000 | Status: AC

## 2013-06-28 MED FILL — Lidocaine HCl IV Inj 20 MG/ML: INTRAVENOUS | Qty: 5 | Status: AC

## 2013-06-28 MED FILL — Albumin, Human Inj 5%: INTRAVENOUS | Qty: 250 | Status: AC

## 2013-06-28 MED FILL — Mannitol IV Soln 20%: INTRAVENOUS | Qty: 500 | Status: AC

## 2013-06-28 MED FILL — Heparin Sodium (Porcine) Inj 1000 Unit/ML: INTRAMUSCULAR | Qty: 10 | Status: AC

## 2013-06-28 MED FILL — Sodium Bicarbonate IV Soln 8.4%: INTRAVENOUS | Qty: 50 | Status: AC

## 2013-06-28 MED FILL — Sodium Chloride IV Soln 0.9%: INTRAVENOUS | Qty: 2000 | Status: AC

## 2013-06-28 NOTE — Progress Notes (Signed)
UR Completed.  Ernesta Trabert Jane [phone] 06/28/2013  

## 2013-06-28 NOTE — Procedures (Signed)
Extubation Procedure Note  Patient Details:   Name: Tyler [name] DOB: [birth year] MRN: [id number]   Airway Documentation:     Evaluation  O2 sats: stable throughout Complications: No apparent complications Patient did tolerate procedure well. Bilateral Breath Sounds: Clear;Diminished Suctioning: Airway Yes  Pt passed SBT and NIF was -50, VC was 1.0L .  RN called MD on call to approve extubation.  RT suctioned Patients mouth and down ETT.  Patient had a positive cuff leak.  RT extubated patient and placed on 5L [place].  Patient tolerated procedure well and was able to state name post extubatin.  Patient has a strong cough and is able to clear secretions.  BBS are clear and diminished.  Patients sats 95% on 5L [place] HR 82 BP 89/51 RR 20.  RT will continue to monitor.  Gabriela Eves 06/28/2013, 2:49 AM

## 2013-06-28 NOTE — Anesthesia Postprocedure Evaluation (Signed)
  Anesthesia Post-op Note  Patient: Tyler [name]  Procedure(s) Performed: Procedure(s) with comments: CORONARY ARTERY BYPASS GRAFTING (CABG) (N/A) - times four utilizing the left internal mammary artery and the right greater saphenous vein harvested endoscopically TRANSESOPHAGEAL ECHOCARDIOGRAM (TEE) (N/A) ENDOVEIN HARVEST OF GREATER SAPHENOUS VEIN (Right)  Patient Location: sicu  Anesthesia Type:General  Level of Consciousness: sedated and Patient remains intubated per anesthesia plan  Airway and Oxygen Therapy: Patient remains intubated per anesthesia plan and Patient placed on Ventilator (see vital sign flow sheet for setting)  Post-op Pain: none  Post-op Assessment: Post-op Vital signs reviewed, Patient's Cardiovascular Status Stable, Respiratory Function Stable, Patent Airway, No signs of Nausea or vomiting and Pain level controlled  Post-op Vital Signs: stable  Complications: No apparent anesthesia complications

## 2013-06-28 NOTE — CV Procedure (Signed)
IABP aspirated and removed from rfa. Manual pressure applied for 30 minutes bp 137/58 hr 110 spo2 93%. Hemostasis achieved. No s&s of hematoma, groin level 0. Tegaderm dressing applied. Patient sedated due to morphine, bedrest instructions attempted.  Distal pulses palpable weak bilateral dp present. Doppler used to confirm.

## 2013-06-28 NOTE — Progress Notes (Signed)
1 Day Post-Op Procedure(s) (LRB): CORONARY ARTERY BYPASS GRAFTING (CABG) (N/A) TRANSESOPHAGEAL ECHOCARDIOGRAM (TEE) (N/A) ENDOVEIN HARVEST OF GREATER SAPHENOUS VEIN (Right) Subjective: `extubated and doing well after emergent CABG IABP at 1:2  Objective: Vital signs in last 24 hours: Temp:  [94.8 F (34.9 C)-98.1 F (36.7 C)] 98.1 F (36.7 C) (12/05 0800) Pulse Rate:  [43-184] 43 (12/05 0800) Cardiac Rhythm:  [-] Normal sinus rhythm (12/05 0800) Resp:  [12-24] 14 (12/05 0800) BP: (85-120)/(40-84) 94/46 mmHg (12/05 0800) SpO2:  [91 %-100 %] 98 % (12/05 0800) Arterial Line BP: (78-149)/(43-68) 107/49 mmHg (12/05 0800) FiO2 (%):  [40 %-100 %] 40 % (12/05 0105) Weight:  [214 lb 8.1 oz (97.3 kg)] 214 lb 8.1 oz (97.3 kg) (12/05 0600)  Hemodynamic parameters for last 24 hours: PAP: (20-36)/(9-22) 27/12 mmHg CO:  [3.2 L/min-7.6 L/min] 5.5 L/min CI:  [1.5 L/min/m2-3.6 L/min/m2] 2.6 L/min/m2  Intake/Output from previous day: 12/04 0701 - 12/05 0700 In: 6847.6 [I.V.:3522.6; ZOXWR:6045; NG/GT:30; IV Piggyback:1650] Out: 6445 [Urine:5925; Chest Tube:520] Intake/Output this shift: Total I/O In: 60 [I.V.:60] Out: -   Lungs clear Pulses intact Neuro intact  Lab Results:  Recent Labs  06/27/13 2239 06/28/13 0345  WBC 11.6* 11.8*  HGB 9.1* 8.7*  HCT 27.2* 25.8*  PLT 119* 118*   BMET:  Recent Labs  06/27/13 2151 06/27/13 2239 06/28/13 0345  NA 143  --  143  K 3.5  --  4.3  CL 111  --  109  CO2  --   --  24  GLUCOSE 130*  --  128*  BUN 12  --  15  CREATININE 1.10 0.95 1.08  CALCIUM  --   --  7.8*    PT/INR:  Recent Labs  06/27/13 1600  LABPROT 17.9*  INR 1.52*   ABG    Component Value Date/Time   PHART 7.368 06/28/2013 0343   HCO3 25.1* 06/28/2013 0343   TCO2 26 06/28/2013 0343   ACIDBASEDEF 5.0* 06/28/2013 0139   O2SAT 100.0 06/28/2013 0343   CBG (last 3)   Recent Labs  06/28/13 0449 06/28/13 0550 06/28/13 0655  GLUCAP 103* 100* 121*     Assessment/Plan: S/P Procedure(s) (LRB): CORONARY ARTERY BYPASS GRAFTING (CABG) (N/A) TRANSESOPHAGEAL ECHOCARDIOGRAM (TEE) (N/A) ENDOVEIN HARVEST OF GREATER SAPHENOUS VEIN (Right) See progression orders   LOS: 1 day    VAN TRIGT III,PETER 06/28/2013

## 2013-06-28 NOTE — Op Note (Signed)
NAMEMADDOXX, BURKITT NO.:  [id number]  MEDICAL RECORD NO.:  [id number]  LOCATION:  2S11C                        FACILITY:  [facility]  PHYSICIAN:  Kerin Perna, M.D.  DATE OF BIRTH:  1929-07-25  DATE OF PROCEDURE:  06/27/2013 DATE OF DISCHARGE:                              OPERATIVE REPORT   OPERATIONS: 1. Emergency coronary artery bypass grafting x4 (left internal mammary     artery to left anterior descending coronary artery, saphenous vein     graft to obtuse marginal 1, sequential saphenous vein graft to     posterior descending and posterolateral branch of the right     coronary artery). 2. Endoscopic harvest, right leg, greater saphenous vein.  PREOPERATIVE DIAGNOSIS:  Severe left main and three-vessel coronary artery disease with cardiopulmonary arrest in cath lab, and balloon pump placement.  POSTOPERATIVE DIAGNOSIS:  Severe left main and three-vessel coronary artery disease with cardiopulmonary arrest in cath lab, and balloon pump placement.  SURGEON:  Kerin Perna, MD  ASSISTANTS:  Coral Ceo, PA and Hendricks Limes PA-C  ANESTHESIA:  General by Maren Beach, MD  INDICATIONS:  The patient is 79 years old and has had symptoms of unstable angina for the past 2 weeks.  He presented to the hospital today as an outpatient for coronary arteriography.  The coronary angiograms were performed by Dr. Verdis Prime, which demonstrated a severe ostial left main stenosis as well as severe mid and distal RCA disease.  The left ventriculogram showed EF to be approximately 50%. Following left main injection, the patient had an acute decompensation and required placement of an intra-aortic balloon pump and intubation. He also had brief chest compressions.  Cardiac Surgical evaluation was requested for emergency surgery for multivessel coronary revascularization.  The patient was seen initially by Dr. Charlett Lango who assisted in the patient's care  and transport to the operating room for emergency CABG.  The patient's family was notified of the patient's condition and the plan for surgical coronary revascularization, which was explained to them and they consented to agree.  When I saw the patient in the operating room, he was being placed under anesthesia and invasive monitoring lines were being placed.  The patient was hemodynamically stable.  The echo showed mild LV global dysfunction. He was in a sinus rhythm.  OPERATIVE FINDINGS: 1. Adequate conduit using left IMA and endoscopically harvested right     leg saphenous vein. 2. Severe coagulopathy from the patient's preoperative active Plavix     use, requiring platelet transfusion and blood cell transfusion. 3. Preserved LV global function following multivessel CABG.  OPERATIVE PROCEDURE:  The patient was induced under anesthesia and prepped and draped as a sterile field.  A proper time-out was performed. A sternal incision was made as the saphenous vein was harvested endoscopically from the right leg.  The left internal mammary artery was harvested as a pedicle graft from its origin at the subclavian vessels. It was clear that the patient had a coagulation abnormality.  The sternal retractor was placed, and the pericardium was opened and suspended.  Pursestrings were placed in the ascending aorta and right atrium, and heparin was administered after  the vein had been harvested. The ACT was documented as being therapeutic, and the patient was cannulated and placed on cardiopulmonary bypass.  Notably, the patient remained hemodynamically stable up until the point of being placed on bypass.  He continued to be stable.  The coronaries were identified for grafting and the mammary artery and vein grafts were prepared for the distal anastomoses.  Cardioplegia cannulas were placed for both antegrade and retrograde cold blood cardioplegia, and the patient was cooled to 32 degrees.   The aortic crossclamp was applied and 1 L of cold blood cardioplegia was delivered in split doses between the antegrade aortic and retrograde coronary sinus catheters.  There was good cardioplegic arrest.  Septal temperature dropped less than 12 degrees.  Cardioplegia was delivered every 20 minutes or less.  The distal coronary anastomoses were performed.  The first distal anastomosis was to the right coronary system.  It was a sequential vein graft from the posterior descending continuing to the posterolateral. The posterior descending was 1.4-mm vessel proximal 90% stenosis.  The vein was sewn side-to-side running 7-0 Prolene with good flow through the graft.  The second distal anastomosis was the continuation of the sequential vein graft to the posterolateral branch of the right coronary.  The posterolateral was a large 1.7-mm vessel, and the end of the vein was sewn end-to-side with running 7-0 Prolene with good flow through the graft.  Cardioplegia was redosed.  The third distal anastomosis was the OM-1 branch of the left coronary. This had a proximal left main 95% stenosis.  It was intramyocardial.  It was a 1.4-mm vessel.  A reverse saphenous vein was sewn end-to-side with running 7-0 Prolene with good flow through the graft.  Cardioplegia was redosed.  Fourth distal anastomosis was the mid LAD.  This was a smaller 1.4-mm vessel with proximal 99% left main stenosis.  The left IMA pedicle was brought through an opening and the left lateral pericardium was brought down onto the LAD and sewn end-to-side with running 8-0 Prolene.  There was good flow through the anastomosis after briefly releasing the pedicle bulldog on the mammary artery.  The bulldog was reapplied and the pedicle was secured to the epicardium.  Cardioplegia was redosed.  The crossclamp was still in place.  Two proximal vein anastomoses were performed on the ascending aorta using a 4.5-mm punch with running  6-0 Prolene.  Prior to tying down the final proximal anastomosis, air was vented from the coronaries with a dose of retrograde and warm blood cardioplegia.  The crossclamp was then removed.  The heart resumed a spontaneous rhythm and did not require cardioversion.  The patient was rewarmed and reperfused.  The vein grafts were de-aired and opened and had good profusion and hemostasis was documented at the proximal and distal anastomoses.  The patient was rewarmed and reperfused to 37 degrees.  Temporary pacing wires were applied and the cardioplegic cannulas were removed.  The patient remained hemodynamically stable, and the 2D echo showed good global LV function.  Protamine was administered without adverse reaction. Cannulas were removed.  The mediastinum was irrigated with warm saline. The patient had diffuse coagulopathy which was severe.  He was given platelets and FFP with improvement in coagulation function.  The superior pericardial fat was closed over the aorta and vein grafts.  An anterior mediastinal tube, a posterior mediastinal tube, and a left pleural chest tube were all placed and brought out through separate incisions. The sternum was closed with interrupted wire.  The pectoralis fascia was closed with a running #1 Vicryl.  The subcutaneous and skin layers were closed with running Vicryl and sterile dressings were applied.  Total cardiopulmonary bypass time was 120 minutes.     Kerin Perna, M.D.     PV/MEDQ  D:  06/27/2013  T:  06/28/2013  Job:  [id number]  cc:   [facility]

## 2013-06-28 NOTE — Progress Notes (Signed)
       Patient Name: Tyler [name] Date of Encounter: 06/28/2013    SUBJECTIVE:Awake and extubated. He is able to communicate and thankful that we found his problem.  TELEMETRY:  NSR Filed Vitals:   06/28/13 0700 06/28/13 0730 06/28/13 0745 06/28/13 0800  BP: 103/84   94/46  Pulse: 62 44 85 43  Temp: 98.1 F (36.7 C) 98.1 F (36.7 C) 98.1 F (36.7 C) 98.1 F (36.7 C)  TempSrc:    Core (Comment)  Resp: 16 16 17 14   Height:      Weight:      SpO2: 91% 97% 98% 98%    Intake/Output Summary (Last 24 hours) at 06/28/13 0812 Last data filed at 06/28/13 0657  Gross per 24 hour  Intake 6847.63 ml  Output   6445 ml  Net 402.63 ml    LABS: Basic Metabolic Panel:  Recent Labs  40/98/11 2151 06/27/13 2239 06/28/13 0345  NA 143  --  143  K 3.5  --  4.3  CL 111  --  109  CO2  --   --  24  GLUCOSE 130*  --  128*  BUN 12  --  15  CREATININE 1.10 0.95 1.08  CALCIUM  --   --  7.8*  MG  --  2.9* 2.4   CBC:  Recent Labs  06/27/13 2239 06/28/13 0345  WBC 11.6* 11.8*  HGB 9.1* 8.7*  HCT 27.2* 25.8*  MCV 84.2 84.0  PLT 119* 118*    Radiology/Studies:  Post CABG chest findings. No acute concerns.  Physical Exam: Blood pressure 94/46, pulse 43, temperature 98.1 F (36.7 C), temperature source Core (Comment), resp. rate 14, height 5\' 9"  (1.753 m), weight 214 lb 8.1 oz (97.3 kg), SpO2 98.00%. Weight change:    No rub or murmur. Chest is clear Neuro is intact!!!  ASSESSMENT:  1. CAD with severe 3VD including ostial LM with decompensation after angiography. 2. Successful multivessel revascularization by Dr. Donata Clay and team....GREAT JOB!! 3. H/O CVA and no new neuro abnormality noted.  Plan:  1. Post op care 2. Monitor for arrhythmia 3. We will follow.  Selinda Eon 06/28/2013, 8:12 AM

## 2013-06-28 NOTE — Progress Notes (Signed)
POD # 1 Emergency CABG  Stable day  IABP dc'ed, off dopamine  BP 112/53  Pulse 107  Temp(Src) 99.7 F (37.6 C) (Core (Comment))  Resp 22  Ht 5\' 9"  (1.753 m)  Wt 214 lb 8.1 oz (97.3 kg)  BMI 31.66 kg/m2  SpO2 97%   Intake/Output Summary (Last 24 hours) at 06/28/13 1735 Last data filed at 06/28/13 1600  Gross per 24 hour  Intake 3232.85 ml  Output   2960 ml  Net 272.85 ml   K=3.5- supplement HCT= 24

## 2013-06-29 ENCOUNTER — Inpatient Hospital Stay (HOSPITAL_COMMUNITY): Payer: Medicare Other

## 2013-06-29 DIAGNOSIS — I209 Angina pectoris, unspecified: Secondary | ICD-10-CM

## 2013-06-29 LAB — BASIC METABOLIC PANEL
BUN: 20 mg/dL (ref 6–23)
CO2: 25 mEq/L (ref 19–32)
Calcium: 7.9 mg/dL — ABNORMAL LOW (ref 8.4–10.5)
Chloride: 108 mEq/L (ref 96–112)
Creatinine, Ser: 1.16 mg/dL (ref 0.50–1.35)
GFR calc Af Amer: 66 mL/min — ABNORMAL LOW (ref >90)
GFR calc non Af Amer: 57 mL/min — ABNORMAL LOW (ref >90)
Glucose, Bld: 136 mg/dL — ABNORMAL HIGH (ref 70–99)
Potassium: 4.3 mEq/L (ref 3.5–5.1)
Sodium: 141 mEq/L (ref 135–145)

## 2013-06-29 LAB — CBC
HCT: 23.3 % — ABNORMAL LOW (ref 39.0–52.0)
Hemoglobin: 7.7 g/dL — ABNORMAL LOW (ref 13.0–17.0)
MCH: 28.4 pg (ref 26.0–34.0)
MCHC: 33 g/dL (ref 30.0–36.0)
MCV: 86 fL (ref 78.0–100.0)
Platelets: 85 10*3/uL — ABNORMAL LOW (ref 150–400)
RBC: 2.71 MIL/uL — ABNORMAL LOW (ref 4.22–5.81)
RDW: 16.6 % — ABNORMAL HIGH (ref 11.5–15.5)
WBC: 11.3 10*3/uL — ABNORMAL HIGH (ref 4.0–10.5)

## 2013-06-29 LAB — GLUCOSE, CAPILLARY
Glucose-Capillary: 113 mg/dL — ABNORMAL HIGH (ref 70–99)
Glucose-Capillary: 117 mg/dL — ABNORMAL HIGH (ref 70–99)
Glucose-Capillary: 122 mg/dL — ABNORMAL HIGH (ref 70–99)
Glucose-Capillary: 125 mg/dL — ABNORMAL HIGH (ref 70–99)
Glucose-Capillary: 142 mg/dL — ABNORMAL HIGH (ref 70–99)
Glucose-Capillary: 132 mg/dL — ABNORMAL HIGH (ref 70–99)
Glucose-Capillary: 122 mg/dL — ABNORMAL HIGH (ref 70–99)

## 2013-06-29 MED ORDER — INSULIN ASPART 100 UNIT/ML ~~LOC~~ SOLN
0.0000 [IU] | Freq: Three times a day (TID) | SUBCUTANEOUS | Status: DC
  Administered 2013-06-29: 2 [IU] via SUBCUTANEOUS

## 2013-06-29 NOTE — Progress Notes (Signed)
Up in chair eating dinner  BP 141/62  Pulse 108  Temp(Src) 98.4 F (36.9 C) (Oral)  Resp 18  Ht 5\' 9"  (1.753 m)  Wt 228 lb 2.8 oz (103.5 kg)  BMI 33.68 kg/m2  SpO2 96%   Intake/Output Summary (Last 24 hours) at 06/29/13 1736 Last data filed at 06/29/13 1700  Gross per 24 hour  Intake   1050 ml  Output    916 ml  Net    134 ml    Has had a good day  Continue current care

## 2013-06-29 NOTE — Progress Notes (Signed)
Doing well post-op. Rhythm NSR. Alert. No complaints.

## 2013-06-29 NOTE — Progress Notes (Signed)
2 Days Post-Op Procedure(s) (LRB): CORONARY ARTERY BYPASS GRAFTING (CABG) (N/A) TRANSESOPHAGEAL ECHOCARDIOGRAM (TEE) (N/A) ENDOVEIN HARVEST OF GREATER SAPHENOUS VEIN (Right) Subjective: Fells "pretty good" C/o not having a BM No incisional pain  Objective: Vital signs in last 24 hours: Temp:  [98.2 F (36.8 C)-99.9 F (37.7 C)] 99 F (37.2 C) (12/06 0800) Pulse Rate:  [83-119] 119 (12/06 0800) Cardiac Rhythm:  [-] Normal sinus rhythm (12/06 0800) Resp:  [14-23] 21 (12/06 0800) BP: (93-131)/(44-69) 111/69 mmHg (12/06 0800) SpO2:  [91 %-98 %] 91 % (12/06 0800) Arterial Line BP: (101-181)/(42-62) 136/51 mmHg (12/06 0500) Weight:  [228 lb 2.8 oz (103.5 kg)] 228 lb 2.8 oz (103.5 kg) (12/06 0500)  Hemodynamic parameters for last 24 hours: PAP: (23-32)/(8-17) 30/16 mmHg CO:  [5.3 L/min-8.1 L/min] 8.1 L/min CI:  [2.5 L/min/m2-3.9 L/min/m2] 3.8 L/min/m2  Intake/Output from previous day: 12/05 0701 - 12/06 0700 In: 1417.4 [P.O.:120; I.V.:797.4; IV Piggyback:500] Out: 1295 [Urine:995; Chest Tube:300] Intake/Output this shift: Total I/O In: -  Out: 20 [Urine:20]  General appearance: alert and no distress Neurologic: alert, oriented Heart: regular rate and rhythm Lungs: diminished breath sounds bibasilar Abdomen: moderately distended, non tender  Lab Results:  Recent Labs  06/28/13 1700 06/28/13 1703 06/29/13 0405  WBC 10.9*  --  11.3*  HGB 8.1* 8.2* 7.7*  HCT 24.1* 24.0* 23.3*  PLT 96*  --  85*   BMET:  Recent Labs  06/28/13 0345  06/28/13 1703 06/29/13 0405  NA 143  --  142 141  K 4.3  --  3.5 4.3  CL 109  --  105 108  CO2 24  --   --  25  GLUCOSE 128*  --  156* 136*  BUN 15  --  16 20  CREATININE 1.08  < > 1.30 1.16  CALCIUM 7.8*  --   --  7.9*  < > = values in this interval not displayed.  PT/INR:  Recent Labs  06/28/13 0840  LABPROT 16.7*  INR 1.39   ABG    Component Value Date/Time   PHART 7.368 06/28/2013 0343   HCO3 25.1* 06/28/2013 0343   TCO2 22 06/28/2013 1703   ACIDBASEDEF 5.0* 06/28/2013 0139   O2SAT 100.0 06/28/2013 0343   CBG (last 3)   Recent Labs  06/29/13 0029 06/29/13 0358 06/29/13 0819  GLUCAP 118* 133* 132*    Assessment/Plan: S/P Procedure(s) (LRB): CORONARY ARTERY BYPASS GRAFTING (CABG) (N/A) TRANSESOPHAGEAL ECHOCARDIOGRAM (TEE) (N/A) ENDOVEIN HARVEST OF GREATER SAPHENOUS VEIN (Right) POD # 2 emergency CABG CV- stable  RESP- continue pulmonary hygiene  RENAL- lytes, creatinine OK  Diurese  ENDO - CBG well controlled- change to AC/HS  PLT count down a little today, not on heparin- check HIT  Anemia secondary to ABL- follow, may need transfusion  Increase mobility   LOS: 2 days    Perris Conwell C 06/29/2013

## 2013-06-30 ENCOUNTER — Inpatient Hospital Stay (HOSPITAL_COMMUNITY): Payer: Medicare Other

## 2013-06-30 LAB — BASIC METABOLIC PANEL
CO2: 26 mEq/L (ref 19–32)
Calcium: 8 mg/dL — ABNORMAL LOW (ref 8.4–10.5)
Chloride: 104 mEq/L (ref 96–112)
Creatinine, Ser: 1 mg/dL (ref 0.50–1.35)
Glucose, Bld: 108 mg/dL — ABNORMAL HIGH (ref 70–99)
Sodium: 138 mEq/L (ref 135–145)

## 2013-06-30 LAB — GLUCOSE, CAPILLARY
Glucose-Capillary: 112 mg/dL — ABNORMAL HIGH (ref 70–99)
Glucose-Capillary: 96 mg/dL (ref 70–99)
Glucose-Capillary: 97 mg/dL (ref 70–99)

## 2013-06-30 LAB — CBC
Hemoglobin: 7.3 g/dL — ABNORMAL LOW (ref 13.0–17.0)
MCH: 28.3 pg (ref 26.0–34.0)
MCV: 86.4 fL (ref 78.0–100.0)
Platelets: 89 10*3/uL — ABNORMAL LOW (ref 150–400)
RBC: 2.58 MIL/uL — ABNORMAL LOW (ref 4.22–5.81)
RDW: 16.3 % — ABNORMAL HIGH (ref 11.5–15.5)
WBC: 10 10*3/uL (ref 4.0–10.5)

## 2013-06-30 MED ORDER — METOPROLOL TARTRATE 25 MG/10 ML ORAL SUSPENSION
25.0000 mg | Freq: Two times a day (BID) | ORAL | Status: DC
  Filled 2013-06-30 (×8): qty 10

## 2013-06-30 MED ORDER — SODIUM CHLORIDE 0.9 % IJ SOLN
3.0000 mL | Freq: Two times a day (BID) | INTRAMUSCULAR | Status: DC
  Administered 2013-06-30 – 2013-07-03 (×6): 3 mL via INTRAVENOUS

## 2013-06-30 MED ORDER — METOPROLOL TARTRATE 25 MG PO TABS
25.0000 mg | ORAL_TABLET | Freq: Two times a day (BID) | ORAL | Status: DC
  Administered 2013-06-30 – 2013-07-03 (×7): 25 mg via ORAL
  Filled 2013-06-30 (×8): qty 1

## 2013-06-30 MED ORDER — FUROSEMIDE 40 MG PO TABS
40.0000 mg | ORAL_TABLET | Freq: Every day | ORAL | Status: DC
  Administered 2013-06-30 – 2013-07-01 (×2): 40 mg via ORAL
  Filled 2013-06-30 (×2): qty 1

## 2013-06-30 MED ORDER — POTASSIUM CHLORIDE CRYS ER 20 MEQ PO TBCR
20.0000 meq | EXTENDED_RELEASE_TABLET | Freq: Two times a day (BID) | ORAL | Status: DC
  Administered 2013-06-30 – 2013-07-03 (×7): 20 meq via ORAL
  Filled 2013-06-30 (×8): qty 1

## 2013-06-30 MED ORDER — MAGNESIUM HYDROXIDE 400 MG/5ML PO SUSP
30.0000 mL | Freq: Every day | ORAL | Status: DC | PRN
  Administered 2013-07-01: 30 mL via ORAL
  Filled 2013-06-30: qty 30

## 2013-06-30 MED ORDER — MOVING RIGHT ALONG BOOK
Freq: Once | Status: AC
  Administered 2013-06-30: 15:00:00
  Filled 2013-06-30: qty 1

## 2013-06-30 MED ORDER — SODIUM CHLORIDE 0.9 % IJ SOLN: 3.0000 mL | INTRAMUSCULAR | Status: DC | PRN

## 2013-06-30 MED ORDER — ALUM & MAG HYDROXIDE-SIMETH 200-200-20 MG/5ML PO SUSP: 15.0000 mL | ORAL | Status: DC | PRN

## 2013-06-30 MED ORDER — DIPHENHYDRAMINE HCL 25 MG PO CAPS: 25.0000 mg | ORAL_CAPSULE | Freq: Every evening | ORAL | Status: DC | PRN

## 2013-06-30 MED ORDER — SODIUM CHLORIDE 0.9 % IV SOLN: 250.0000 mL | INTRAVENOUS | Status: DC | PRN

## 2013-06-30 MED ORDER — ZOLPIDEM TARTRATE 5 MG PO TABS: 5.0000 mg | ORAL_TABLET | Freq: Every evening | ORAL | Status: DC | PRN

## 2013-06-30 NOTE — Progress Notes (Signed)
3 Days Post-Op Procedure(s) (LRB): CORONARY ARTERY BYPASS GRAFTING (CABG) (N/A) TRANSESOPHAGEAL ECHOCARDIOGRAM (TEE) (N/A) ENDOVEIN HARVEST OF GREATER SAPHENOUS VEIN (Right) Subjective: No complaints this AM Minimal pain, no nausea or shortness of breath, + BM  Objective: Vital signs in last 24 hours: Temp:  [97.7 F (36.5 C)-98.6 F (37 C)] 98 F (36.7 C) (12/07 0755) Pulse Rate:  [88-118] 100 (12/07 0800) Cardiac Rhythm:  [-] Normal sinus rhythm;Sinus tachycardia (12/06 2000) Resp:  [13-28] 14 (12/07 0800) BP: (88-144)/(49-86) 127/69 mmHg (12/07 0800) SpO2:  [93 %-99 %] 96 % (12/07 0800) Weight:  [229 lb 15 oz (104.3 kg)] 229 lb 15 oz (104.3 kg) (12/07 0600)  Hemodynamic parameters for last 24 hours:    Intake/Output from previous day: 12/06 0701 - 12/07 0700 In: 980 [P.O.:960; I.V.:20] Out: 1981 [Urine:1780; Stool:1; Chest Tube:200] Intake/Output this shift: Total I/O In: -  Out: 85 [Urine:85]  General appearance: alert and no distress Neurologic: intact Heart: regular rate and rhythm Lungs: diminished breath sounds bibasilar Abdomen: normal findings: soft, non-tender Wound: clean and dry  Lab Results:  Recent Labs  06/29/13 0405 06/30/13 0341  WBC 11.3* 10.0  HGB 7.7* 7.3*  HCT 23.3* 22.3*  PLT 85* 89*   BMET:  Recent Labs  06/29/13 0405 06/30/13 0341  NA 141 138  K 4.3 3.9  CL 108 104  CO2 25 26  GLUCOSE 136* 108*  BUN 20 23  CREATININE 1.16 1.00  CALCIUM 7.9* 8.0*    PT/INR:  Recent Labs  06/28/13 0840  LABPROT 16.7*  INR 1.39   ABG    Component Value Date/Time   PHART 7.368 06/28/2013 0343   HCO3 25.1* 06/28/2013 0343   TCO2 22 06/28/2013 1703   ACIDBASEDEF 5.0* 06/28/2013 0139   O2SAT 100.0 06/28/2013 0343   CBG (last 3)   Recent Labs  06/30/13 0002 06/30/13 0415 06/30/13 0756  GLUCAP 97 103* 96    Assessment/Plan: S/P Procedure(s) (LRB): CORONARY ARTERY BYPASS GRAFTING (CABG) (N/A) TRANSESOPHAGEAL ECHOCARDIOGRAM (TEE)  (N/A) ENDOVEIN HARVEST OF GREATER SAPHENOUS VEIN (Right) Plan for transfer to step-down: see transfer orders  POD # 3 emergency CABG  CV- stable, sinus rhythm  RESP- continue IS for bibasilar atelectasis  RENAL- creatinine stable, continue diuresis, dc foley  ANEMIA- stable  Thrombocytopenia- stable, no heparin  Continue ambulation   LOS: 3 days    Rakiya Krawczyk C 06/30/2013

## 2013-06-30 NOTE — Evaluation (Signed)
Physical Therapy Evaluation Patient Details Name: Tyler [name] MRN: [id number] DOB: 04/04/1929 Today's Date: 06/30/2013 Time: 4098-1191 PT Time Calculation (min): 30 min  PT Assessment / Plan / Recommendation History of Present Illness  Patient is an 79 yo male s/p CABG x4.  Clinical Impression  Patient presents with problems listed below.  Will benefit from acute PT to address these issues prior to discharge.  Patient has 24 hour assist.  Recommend HHPT for continued therapy at discharge.    PT Assessment  Patient needs continued PT services    Follow Up Recommendations  Home health PT;Supervision/Assistance - 24 hour    Does the patient have the potential to tolerate intense rehabilitation      Barriers to Discharge        Equipment Recommendations  Rolling walker with 5" wheels;3in1 (PT)    Recommendations for Other Services     Frequency Min 3X/week    Precautions / Restrictions Precautions Precautions: Sternal;Fall Restrictions Weight Bearing Restrictions: No Other Position/Activity Restrictions: Sternal precautions   Pertinent Vitals/Pain       Mobility  Bed Mobility Bed Mobility: Not assessed Transfers Transfers: Sit to Stand;Stand to Sit Sit to Stand: 3: Mod assist;Without upper extremity assist;From chair/3-in-1 Stand to Sit: 4: Min assist;Without upper extremity assist;To chair/3-in-1 Details for Transfer Assistance: Reviewed sternal precautions with patient.  Patient able to use rocking motion to assist with rise to standing. Required assist to move to standing and for balance initially. Ambulation/Gait Ambulation/Gait Assistance: 4: Min assist Ambulation Distance (Feet): 180 Feet Assistive device: Rolling walker Ambulation/Gait Assistance Details: Verbal cues for safe use of RW.  Verbal cues to stand upright and move at safe speed. Gait Pattern: Step-through pattern;Decreased step length - right;Decreased step length - left;Shuffle;Trunk flexed Gait  velocity: Slow gait speed. General Gait Details: Patient with dyspnea 2/4 with gait.    Exercises     PT Diagnosis: Difficulty walking;Generalized weakness  PT Problem List: Decreased strength;Decreased activity tolerance;Decreased balance;Decreased mobility;Decreased knowledge of use of DME;Decreased knowledge of precautions;Cardiopulmonary status limiting activity PT Treatment Interventions: DME instruction;Gait training;Stair training;Functional mobility training;Patient/family education     PT Goals(Current goals can be found in the care plan section) Acute Rehab PT Goals Patient Stated Goal: To go home PT Goal Formulation: With patient/family Time For Goal Achievement: 07/07/13 Potential to Achieve Goals: Good  Visit Information  Last PT Received On: 06/30/13 Assistance Needed: +1 History of Present Illness: Patient is an 79 yo male s/p CABG x4.       Prior Functioning  Home Living Family/patient expects to be discharged to:: Private residence Living Arrangements: Spouse/significant other Available Help at Discharge: Family;Available 24 hours/day (Daughter helping for several months) Type of Home: House Home Access: Stairs to enter [employer] of Steps: 2 Entrance Stairs-Rails: None Home Layout: Two level;Able to live on main level with bedroom/bathroom Home Equipment: [place] Mor - single point Prior Function Level of Independence: Independent Communication Communication: HOH    Cognition  Cognition Arousal/Alertness: Awake/alert Behavior During Therapy: WFL for tasks assessed/performed Overall Cognitive Status: Within Functional Limits for tasks assessed    Extremity/Trunk Assessment Upper Extremity Assessment Upper Extremity Assessment: Overall WFL for tasks assessed Lower Extremity Assessment Lower Extremity Assessment: Generalized weakness   Balance    End of Session PT - End of Session Equipment Utilized During Treatment: Gait belt Activity  Tolerance: Patient limited by fatigue Patient left: in chair;with call bell/phone within reach;with family/visitor present Nurse Communication: Mobility status  GP     Beaulah Corin  H 06/30/2013, 5:25 PM Durenda Hurt. Renaldo Fiddler, [facility] Acute Rehab Services Pager [phone]

## 2013-06-30 NOTE — Progress Notes (Signed)
Pt transferred to 2W27 via wheelchair on monitor. Belongings transferred with patient. Pt positioned comfortably in chair, on room air, monitor on, VSS. Tolerated transfer well.

## 2013-07-01 ENCOUNTER — Inpatient Hospital Stay (HOSPITAL_COMMUNITY): Payer: Medicare Other

## 2013-07-01 DIAGNOSIS — I2510 Atherosclerotic heart disease of native coronary artery without angina pectoris: Secondary | ICD-10-CM

## 2013-07-01 DIAGNOSIS — I5031 Acute diastolic (congestive) heart failure: Secondary | ICD-10-CM

## 2013-07-01 LAB — TYPE AND SCREEN
ABO/RH(D): A NEG
Unit division: 0

## 2013-07-01 LAB — CBC
HCT: 23.9 % — ABNORMAL LOW (ref 39.0–52.0)
Hemoglobin: 7.8 g/dL — ABNORMAL LOW (ref 13.0–17.0)
MCH: 28.4 pg (ref 26.0–34.0)
MCHC: 32.6 g/dL (ref 30.0–36.0)
MCV: 86.9 fL (ref 78.0–100.0)
RDW: 16.6 % — ABNORMAL HIGH (ref 11.5–15.5)

## 2013-07-01 LAB — BASIC METABOLIC PANEL
BUN: 25 mg/dL — ABNORMAL HIGH (ref 6–23)
Creatinine, Ser: 1.01 mg/dL (ref 0.50–1.35)
GFR calc Af Amer: 78 mL/min — ABNORMAL LOW (ref >90)
GFR calc non Af Amer: 67 mL/min — ABNORMAL LOW (ref >90)
Glucose, Bld: 103 mg/dL — ABNORMAL HIGH (ref 70–99)
Potassium: 4 mEq/L (ref 3.5–5.1)

## 2013-07-01 LAB — GLUCOSE, CAPILLARY
Glucose-Capillary: 107 mg/dL — ABNORMAL HIGH (ref 70–99)
Glucose-Capillary: 105 mg/dL — ABNORMAL HIGH (ref 70–99)

## 2013-07-01 LAB — HEMOGLOBIN A1C
Hgb A1c MFr Bld: 6 % — ABNORMAL HIGH (ref <5.7)
Mean Plasma Glucose: 126 mg/dL — ABNORMAL HIGH (ref <117)

## 2013-07-01 MED ORDER — LACTULOSE 10 GM/15ML PO SOLN
20.0000 g | Freq: Every day | ORAL | Status: DC
  Filled 2013-07-01: qty 30

## 2013-07-01 MED ORDER — FUROSEMIDE 40 MG PO TABS
40.0000 mg | ORAL_TABLET | Freq: Every day | ORAL | Status: DC
  Administered 2013-07-03: 40 mg via ORAL
  Filled 2013-07-01: qty 1

## 2013-07-01 MED ORDER — LISINOPRIL 5 MG PO TABS
5.0000 mg | ORAL_TABLET | Freq: Every day | ORAL | Status: DC
  Administered 2013-07-01 – 2013-07-03 (×3): 5 mg via ORAL
  Filled 2013-07-01 (×3): qty 1

## 2013-07-01 MED ORDER — LACTULOSE 10 GM/15ML PO SOLN
20.0000 g | Freq: Once | ORAL | Status: AC
  Administered 2013-07-01: 20 g via ORAL
  Filled 2013-07-01: qty 30

## 2013-07-01 MED ORDER — FUROSEMIDE 10 MG/ML IJ SOLN
40.0000 mg | Freq: Once | INTRAMUSCULAR | Status: AC
  Administered 2013-07-02: 40 mg via INTRAVENOUS
  Filled 2013-07-01: qty 4

## 2013-07-01 NOTE — Progress Notes (Addendum)
      [address]       [place] [address]             [phone]        4 Days Post-Op Procedure(s) (LRB): CORONARY ARTERY BYPASS GRAFTING (CABG) (N/A) TRANSESOPHAGEAL ECHOCARDIOGRAM (TEE) (N/A) ENDOVEIN HARVEST OF GREATER SAPHENOUS VEIN (Right)  Subjective: Patient's only complaint is constipation  Objective: Vital signs in last 24 hours: Temp:  [97.2 F (36.2 C)-98 F (36.7 C)] 98 F (36.7 C) (12/08 0508) Pulse Rate:  [96-117] 96 (12/08 0508) Cardiac Rhythm:  [-] Normal sinus rhythm;Sinus tachycardia (12/07 1940) Resp:  [18-29] 18 (12/08 0508) BP: (108-132)/(53-100) 117/63 mmHg (12/08 0508) SpO2:  [94 %-98 %] 95 % (12/08 0508) Weight:  [101.4 kg (223 lb 8.7 oz)] 101.4 kg (223 lb 8.7 oz) (12/08 0508)  Pre op weight 98 kg Current Weight  07/01/13 101.4 kg (223 lb 8.7 oz)       Intake/Output from previous day: 12/07 0701 - 12/08 0700 In: 630 [P.O.:630] Out: 795 [Urine:745; Chest Tube:50]   Physical Exam:  Cardiovascular: RRR, no murmurs, gallops, or rubs. Pulmonary: Slightly diminished at bases; no rales, wheezes, or rhonchi. Abdomen: Soft, non tender, bowel sounds present. Extremities: Mild bilateral lower extremity edema R>L Wounds: Clean and dry.  No erythema or signs of infection.  Lab Results: CBC: Recent Labs  06/30/13 0341 07/01/13 0555  WBC 10.0 8.8  HGB 7.3* 7.8*  HCT 22.3* 23.9*  PLT 89* 146*   BMET:  Recent Labs  06/30/13 0341 07/01/13 0555  NA 138 137  K 3.9 4.0  CL 104 104  CO2 26 23  GLUCOSE 108* 103*  BUN 23 25*  CREATININE 1.00 1.01  CALCIUM 8.0* 8.5    PT/INR:  Lab Results  Component Value Date   INR 1.39 06/28/2013   INR 1.52* 06/27/2013   INR 0.98 06/27/2013   ABG:  INR: Will add last result for INR, ABG once components are confirmed Will add last 4 CBG results once components are confirmed  Assessment/Plan:  1. CV - SR. On Lopressor 25 bid and Plavix 75 daily. Start low dose ACE 2.   Pulmonary - CXR this am shows no pneumothorax, small bilateral pleural effusions and atelectasis.Encourage incentive spirometer 3. Volume Overload - On Lasix 40 daily 4.  Acute blood loss anemia - H and H up to 7.8 and 23.9 this am. Continue Trinsicon 5. Mild thrombocytopenia-platelets up to 146,000 6. CBGs 112/124/100. No history of diabetes. Will check HGA1C. Stop scheduled insulin and give PRN 7. Remove EPW in am 8.LOC constipation 9. Possible discharge 1-2 days  ZIMMERMAN,DONIELLE MPA-C 07/01/2013,8:24 AM  CXR clear NSR Home  12-10 on plavix, po Niferex

## 2013-07-01 NOTE — Progress Notes (Signed)
Physical Therapy Treatment Patient Details Name: Tyler [name] MRN: [id number] DOB: 26-Jan-1929 Today's Date: 07/01/2013 Time: 4098-1191 PT Time Calculation (min): 27 min  PT Assessment / Plan / Recommendation  History of Present Illness Patient is an 79 yo male s/p CABG x4.   PT Comments   Patient able to progress walking with two sessions with short rest in between.  Does get SOB with ambulation with SpO2 96% HR 102.  Will need follow up HHPT at d/c.  Follow Up Recommendations  Home health PT;Supervision/Assistance - 24 hour     Does the patient have the potential to tolerate intense rehabilitation   N/A  Barriers to Discharge  None      Equipment Recommendations  Rolling walker with 5" wheels;3in1 (PT)    Recommendations for Other Services  None  Frequency Min 3X/week   Progress towards PT Goals Progress towards PT goals: Progressing toward goals  Plan Current plan remains appropriate    Precautions / Restrictions Precautions Precautions: Sternal;Fall   Pertinent Vitals/Pain Denies pain    Mobility  Bed Mobility Bed Mobility: Not assessed Transfers Sit to Stand: From chair/3-in-1;4: Min assist Stand to Sit: To chair/3-in-1;4: Min assist Details for Transfer Assistance: cues for precautions and using momentum to assist with sit to stand Ambulation/Gait Ambulation Distance (Feet): 150 Feet (x 2) Assistive device: Rolling walker Gait Pattern: Step-through pattern;Decreased step length - right;Decreased step length - left;Shuffle;Trunk flexed General Gait Details: Patient with dyspnea 2/4 with gait.     PT Goals (current goals can now be found in the care plan section)    Visit Information  Last PT Received On: 07/01/13 Assistance Needed: +1 History of Present Illness: Patient is an 79 yo male s/p CABG x4.    Subjective Data   Ready for second walk today   Cognition  Cognition Arousal/Alertness: Awake/alert Behavior During Therapy: WFL for tasks  assessed/performed Overall Cognitive Status: Within Functional Limits for tasks assessed    Balance     End of Session PT - End of Session Equipment Utilized During Treatment: Gait belt Activity Tolerance: Patient limited by fatigue Patient left: in chair;with call bell/phone within reach   GP     [facility] 07/01/2013, 5:02 PM [place], [place] [phone] 07/01/2013

## 2013-07-01 NOTE — Progress Notes (Signed)
       Patient Name: Tyler [name] Date of Encounter: 07/01/2013    SUBJECTIVE: More short of breath today than prior days.  TELEMETRY:   NSR and ST: Filed Vitals:   06/30/13 2105 07/01/13 0508 07/01/13 1010 07/01/13 1345  BP: 112/61 117/63 126/64 117/63  Pulse: 103 96 99 90  Temp: 97.2 F (36.2 C) 98 F (36.7 C)  97.9 F (36.6 C)  TempSrc: Oral Oral  Oral  Resp: 20 18  18   Height:      Weight:  223 lb 8.7 oz (101.4 kg)    SpO2: 94% 95%  95%    Intake/Output Summary (Last 24 hours) at 07/01/13 1713 Last data filed at 07/01/13 1300  Gross per 24 hour  Intake    480 ml  Output    751 ml  Net   -271 ml    LABS: Basic Metabolic Panel:  Recent Labs  16/10/96 0341 07/01/13 0555  NA 138 137  K 3.9 4.0  CL 104 104  CO2 26 23  GLUCOSE 108* 103*  BUN 23 25*  CREATININE 1.00 1.01  CALCIUM 8.0* 8.5   CBC:  Recent Labs  06/30/13 0341 07/01/13 0555  WBC 10.0 8.8  HGB 7.3* 7.8*  HCT 22.3* 23.9*  MCV 86.4 86.9  PLT 89* 146*   Hemoglobin A1C:  Recent Labs  07/01/13 0555  HGBA1C 6.0*   Fasting Lipid Panel: No results found for this basename: CHOL, HDL, LDLCALC, TRIG, CHOLHDL, LDLDIRECT,  in the last 72 hours  Radiology/Studies: CXR 12.07/14 Increased bilateral pleural effusions.  Physical Exam: Blood pressure 117/63, pulse 90, temperature 97.9 F (36.6 C), temperature source Oral, resp. rate 18, height 5\' 9"  (1.753 m), weight 223 lb 8.7 oz (101.4 kg), SpO2 95.00%. Weight change: -6 lb 6.3 oz (-2.9 kg)   Decreased basilar breath sounds. Mild elevation in Jugular veins No pericardial rub or gallop Mild Lower extremity edema.  ASSESSMENT:  1. Possible component of diastolic HF 2. Stable s/p CABG  Plan:  1. Diuresis 2. BNP  Signed, Lesleigh Noe 07/01/2013, 5:13 PM

## 2013-07-01 NOTE — Discharge Summary (Signed)
Physician Discharge Summary  Patient ID: Tyler [name] MRN: [id number] DOB/AGE: 09-04-1928 79 y.o.  Admit date: 06/27/2013 Discharge date: 07/01/2013  Admission Diagnoses:  Patient Active Problem List   Diagnosis Date Noted  . Exertional 06/24/2013    Class: Acute  . Coronary atherosclerosis   . Hypertension   . Hyperlipidemia   . CVA (cerebral infarction)    Discharge Diagnoses:   Patient Active Problem List   Diagnosis Date Noted  . S/P CABG x 4 06/27/2013  . Exertional 06/24/2013    Class: Acute  . Coronary atherosclerosis   . Hypertension   . Hyperlipidemia   . CVA (cerebral infarction)    Discharged Condition: good  History of Present Illness:   Tyler [name] is a 79 yo white male with worsening Angina symptoms who presented for elective cardiac catheterization on 06/27/2013.  During the catheterization the patient was found to have critical left main stenosis and ultimately suffered a V. Fib arrest during the procedure.  He required intubation and insertion of an intra aortic balloon pump.  Due to this it was felt the patient would require emergent Coronary bypass grafting procedure.  The patient was evaluated by TCTS and they were agreeable to proceed.  Hospital Course:   The patient was taken emergently to the operating room.  He underwent CABG x 4 utilizing LIMA to LAD, Sequential SVG to PDA and PLB, and SVG to Obtuse Marginal.  He also underwent Endoscopic saphenous vein harvest from his right leg.  He tolerated the procedure and was taken to the SICU in stable condition.  During his stay in the ICU the patient was extubated the morning after surgery.  His arterial balloon pump was weaned off and removed POD #1.  He was weaned off cardiac drips as tolerated.  His chest tubes and arterial lines were removed without difficulty.  He was diuresed for hypervolemia.  He was maintaining NSR and transferred to the step down unit in stable condition.  The patient continues to do  well. He continues to maintain NSR and his pacing wires have been removed without difficulty.  He is ambulating with minimal difficulty and tolerating a cardia diet.  Should no further issues arise we anticipate discharge home in the next 24-48 hours.  He will follow up with Dr. Donata Clay on 07/24/2013 with a CXR prior to his appointment. He will also need to follow up with his primary cardiologist is 2-4 weeks time.      Significant Diagnostic Studies: angiography:   HEMODYNAMICS: Aortic pressure 110/58; LV pressure 110/14; LVEDP 22,   ANGIOGRAPHIC DATA:  The left main coronary artery is 95% calcified proximal to ostial left main stenosis..  The left anterior descending artery is 80-90% mid LAD after the first septal perforator.  The left circumflex artery is proximal 80-90% obstruction.  The right coronary artery is ostial 70% with a large distal PDA and 99% stenosis proximal to the origin of a large left ventricular. The patient is right dominant.[name]   LEFT VENTRICULOGRAM: Left ventricular angiogram was done in the 30 RAO projection and revealed inferoapical hypokinesis. EF 50%   Treatments: surgery:   1. Emergency coronary artery bypass grafting x4 (left internal mammary artery to left anterior descending coronary artery, saphenous vein graft to obtuse marginal 1, sequential saphenous vein graft to posterior descending and posterolateral branch of the right coronary artery).  2. Endoscopic harvest, right leg, greater saphenous vein.  Disposition:  Home  Discharge Medications:     Medication List  STOP taking these medications       nitroGLYCERIN 0.4 MG SL tablet  Commonly known as:  NITROSTAT      TAKE these medications       aspirin 81 MG tablet  Take 81 mg by mouth daily.     clopidogrel 75 MG tablet  Commonly known as:  PLAVIX  Take 75 mg by mouth daily with breakfast.     ferrous fumarate-b12-vitamic C-folic acid capsule  Commonly known as:  TRINSICON / FOLTRIN   Take 1 capsule by mouth 2 (two) times daily after a meal.     FISH OIL PO  Take 100 Units by mouth daily.     folic acid 800 MCG tablet  Commonly known as:  FOLVITE  Take 800 mcg by mouth daily.     furosemide 40 MG tablet  Commonly known as:  LASIX  Take 1 tablet (40 mg total) by mouth daily. For 7 days     lisinopril 5 MG tablet  Commonly known as:  PRINIVIL,ZESTRIL  Take 1 tablet (5 mg total) by mouth daily.     metoprolol tartrate 25 MG tablet  Commonly known as:  LOPRESSOR  Take 25 mg by mouth 2 (two) times daily.     multivitamin tablet  Take 1 tablet by mouth daily.     oxyCODONE 5 MG immediate release tablet  Commonly known as:  Oxy IR/ROXICODONE  Take 1-2 tablets (5-10 mg total) by mouth every 3 (three) hours as needed for moderate pain.     potassium chloride SA 20 MEQ tablet  Commonly known as:  K-DUR,KLOR-CON  Take 1 tablet (20 mEq total) by mouth daily. For 7 days     pravastatin 40 MG tablet  Commonly known as:  PRAVACHOL  Take 40 mg by mouth at bedtime.     terazosin 5 MG capsule  Commonly known as:  HYTRIN  Take 5 mg by mouth at bedtime.     traMADol 50 MG tablet  Commonly known as:  ULTRAM  Take 1 tablet (50 mg total) by mouth every 6 (six) hours as needed for moderate pain.     Vitamin D-3 1000 UNITS Caps  Take 1 capsule by mouth daily.         The patient has been discharged on:   1.Beta Blocker:  Yes [ x  ]                              No   [   ]                              If No, reason:  2.Ace Inhibitor/ARB: Yes [ x  ]                                     No  [    ]                                     If No, reason:  3.Statin:   Yes [x   ]                  No  [   ]  If No, reason:  4.Ecasa:  Yes  [ x  ]                  No   [   ]                  If No, reason:       Future Appointments Provider Department Dept Phone   07/24/2013 1:00 PM Kerin Perna, MD [facility]  [facility] [phone]         Follow-up Information   Follow up with VAN Dinah Beers, MD On 07/24/2013. (Appointment is at 1:00)    Specialty:  Cardiothoracic Surgery   Contact information:   [address] [place] [address] [place] [address] [phone]       Follow up with [facility] On 07/24/2013. (Please get CXR 1 hour prior to appointment with Dr. Donata Clay)    Contact information:   [place]       Signed: Jernie Schutt 07/01/2013, 10:55 AM

## 2013-07-01 NOTE — Progress Notes (Addendum)
CARDIAC REHAB PHASE I   PRE:  Rate/Rhythm: 109 ST  BP:  Supine:   Sitting: 120/70  Standing:    SaO2: 95 RA  MODE:  Ambulation: 300 ft   POST:  Rate/Rhythm: 129 ST  BP:  Supine:   Sitting: 140/60  Standing:    SaO2: 97 RA 1100-1135 Assisted X 1 used walker and gait belt to ambulate. Gait steady with walker. Pt is DOE and took several standing rest stops with 300 foot walk. HR after walk 129 ST. Pt back to recliner after walk with call light in reach. Rn aware of increased HR with walking.  Melina Copa RN 07/01/2013 11:36 AM

## 2013-07-02 LAB — HEPARIN INDUCED THROMBOCYTOPENIA PNL
Patient O.D.: 0.035
UFH Low Dose 0.1 IU/mL: 0 % Release
UFH SRA Result: NEGATIVE

## 2013-07-02 LAB — GLUCOSE, CAPILLARY
Glucose-Capillary: 107 mg/dL — ABNORMAL HIGH (ref 70–99)
Glucose-Capillary: 104 mg/dL — ABNORMAL HIGH (ref 70–99)

## 2013-07-02 LAB — PRO B NATRIURETIC PEPTIDE: Pro B Natriuretic peptide (BNP): 1458 pg/mL — ABNORMAL HIGH (ref 0–450)

## 2013-07-02 NOTE — Care Management Note (Signed)
    Page 1 of 2   07/03/2013     11:50:25 AM   CARE MANAGEMENT NOTE 07/03/2013  Patient:  Tyler [name], Tyler [name]   Account Number:  [id number]  Date Initiated:  06/28/2013  Documentation initiated by:  Avie Arenas  Subjective/Objective Assessment:   Post op CABG x4     Action/Plan:   Anticipated DC Date:  07/03/2013   Anticipated DC Plan:  HOME W HOME HEALTH SERVICES      DC Planning Services  CM consult      [facility] Choice  HOME HEALTH   Choice offered to / List presented to:  C-1 Patient   DME arranged  WALKER - ROLLING  BEDSIDE COMMODE      DME agency  [facility]     HH arranged  HH-1 RN  HH-2 PT      [facility] agency  [facility]   Status of service:  Completed, signed off Medicare Important Message given?   (If response is "NO", the following Medicare IM given date fields will be blank) Date Medicare IM given:   Date Additional Medicare IM given:    Discharge Disposition:  HOME W HOME HEALTH SERVICES  Per UR Regulation:  Reviewed for med. necessity/level of care/duration of stay  If discussed at Long Length of Stay Meetings, dates discussed:    Comments:  ContactZell, Hylton Spouse [phone]   [phone]   Ingram,Katheleen Daughter     [phone]  07/03/13 Issiac Jamar,RN,BSN [phone] PT FOR DC HOME TODAY WITH SPOUSE AND FAMILY.  REFERRAL TO [facility] FOR HH FOLLOW UP, PER PT CHOICE.  START OF CARE 24-48H POST DC DATE.  WIFE  HAS ALREADY TAKEN ORDERED DME HOME.  07/02/13 Bunnie Rehberg,RN,BSN [phone] BEDSIDE TABLE RENTAL PRICE $53.37; PURCHASE PRICE $160.12 (QUOTED FROM [facility]).  07/02/13 Beautiful Pensyl,RN,BSN [phone] PT FOR LIKELY DC HOME TOMORROW.  WOULD BENEFIT FROM [facility] FOR RESTORATIVE CARE AND HHPT, AS RECOMMENDED BY PHYSICAL THERAPIST.  PT REQUESTS RW AND 3 IN 1 FOR HOME.  WIFE INTERESTED IN OVERBED TABLE FOR HOME; WILL INVESTIGATE COST OF RENTAL VS. PURCHASE PRICE.  PT/WIFE GIVEN [facility] HH LIST OF PROVIDERS TO REVIEW.  WIFE WOULD  LIKE TO SPEAK WITH FRIENDS TO HELP MAKE DECISION.  WILL FOLLOW UP WITH PT IN AM WITH DECISION.

## 2013-07-02 NOTE — Progress Notes (Signed)
CARDIAC REHAB PHASE I   PRE:  Rate/Rhythm: 81 SR  BP:  Supine:   Sitting: 110/50  Standing:    SaO2: 94 RA  MODE:  Ambulation: 180 ft   POST:  Rate/Rhythm: 87  BP:  Supine:   Sitting: 114/54  Standing:    SaO2: 98 RA 1420-1500 Assisted X 1 used gait belt and walker to ambulate. Gait steady with walker. Pt tires easily and c/o of SOB. VS stable and room air sats good. Pt would not go any further than 180 feet. Pt back to recliner after walk with call light in reach. Placed recovery from heart surgery video for pt and wife to watch.  Melina Copa RN 07/02/2013 2:56 PM

## 2013-07-02 NOTE — Progress Notes (Signed)
      [address]       [place] [address]             [phone]      5 Days Post-Op Procedure(s) (LRB): CORONARY ARTERY BYPASS GRAFTING (CABG) (N/A) TRANSESOPHAGEAL ECHOCARDIOGRAM (TEE) (N/A) ENDOVEIN HARVEST OF GREATER SAPHENOUS VEIN (Right)  Subjective:  Tyler [name] continues to have some exertional shortness of breath.  + BM  Objective: Vital signs in last 24 hours: Temp:  [97.3 F (36.3 C)-98.2 F (36.8 C)] 97.3 F (36.3 C) (12/09 0551) Pulse Rate:  [87-99] 99 (12/09 0551) Cardiac Rhythm:  [-] Normal sinus rhythm;Sinus tachycardia (12/08 1930) Resp:  [17-19] 19 (12/09 0551) BP: (109-126)/(61-67) 119/67 mmHg (12/09 0551) SpO2:  [95 %-96 %] 95 % (12/09 0551) Weight:  [222 lb 3.6 oz (100.8 kg)] 222 lb 3.6 oz (100.8 kg) (12/09 0551)  Intake/Output from previous day: 12/08 0701 - 12/09 0700 In: 720 [P.O.:720] Out: 1526 [Urine:1525; Stool:1]  General appearance: alert, cooperative and no distress Heart: regular rate and rhythm Lungs: clear to auscultation bilaterally Abdomen: soft, non-tender; bowel sounds normal; no masses,  no organomegaly Extremities: edema 1+ pitting, RLE tender to palpation, no evidence of cellulitis, + venous stasis changes Wound: clean and dry  Lab Results:  Recent Labs  06/30/13 0341 07/01/13 0555  WBC 10.0 8.8  HGB 7.3* 7.8*  HCT 22.3* 23.9*  PLT 89* 146*   BMET:  Recent Labs  06/30/13 0341 07/01/13 0555  NA 138 137  K 3.9 4.0  CL 104 104  CO2 26 23  GLUCOSE 108* 103*  BUN 23 25*  CREATININE 1.00 1.01  CALCIUM 8.0* 8.5    PT/INR: No results found for this basename: LABPROT, INR,  in the last 72 hours ABG    Component Value Date/Time   PHART 7.368 06/28/2013 0343   HCO3 25.1* 06/28/2013 0343   TCO2 22 06/28/2013 1703   ACIDBASEDEF 5.0* 06/28/2013 0139   O2SAT 100.0 06/28/2013 0343   CBG (last 3)   Recent Labs  07/01/13 1627 07/01/13 2118 07/02/13 0600  GLUCAP 107* 105* 97     Assessment/Plan: S/P Procedure(s) (LRB): CORONARY ARTERY BYPASS GRAFTING (CABG) (N/A) TRANSESOPHAGEAL ECHOCARDIOGRAM (TEE) (N/A) ENDOVEIN HARVEST OF GREATER SAPHENOUS VEIN (Right)  1. Cv- NSR, tachy- on Lopressor, ACE and Plavix, mild elevation in BNP >1000 2. Pulm- off oxygen, encouraged use of IS 3. Renal- remains volume overloaded- to get IV Lasix today 4. Dispo- patient medically stable, will diurese today, d/c EPW, home in AM   LOS: 5 days    Tyler [name] 07/02/2013

## 2013-07-02 NOTE — Progress Notes (Signed)
       Patient Name: Tyler [name] Date of Encounter: 07/02/2013    SUBJECTIVE:Still dyspneic with activity  TELEMETRY:  NSR Filed Vitals:   07/01/13 1010 07/01/13 1345 07/01/13 2031 07/02/13 0551  BP: 126/64 117/63 109/61 119/67  Pulse: 99 90 87 99  Temp:  97.9 F (36.6 C) 98.2 F (36.8 C) 97.3 F (36.3 C)  TempSrc:  Oral Oral Oral  Resp:  18 17 19   Height:      Weight:    222 lb 3.6 oz (100.8 kg)  SpO2:  95% 96% 95%    Intake/Output Summary (Last 24 hours) at 07/02/13 0838 Last data filed at 07/02/13 0120  Gross per 24 hour  Intake    480 ml  Output   1526 ml  Net  -1046 ml    LABS: Basic Metabolic Panel:  Recent Labs  16/10/96 0341 07/01/13 0555  NA 138 137  K 3.9 4.0  CL 104 104  CO2 26 23  GLUCOSE 108* 103*  BUN 23 25*  CREATININE 1.00 1.01  CALCIUM 8.0* 8.5   CBC:  Recent Labs  06/30/13 0341 07/01/13 0555  WBC 10.0 8.8  HGB 7.3* 7.8*  HCT 22.3* 23.9*  MCV 86.4 86.9  PLT 89* 146*    Recent Labs  07/01/13 0555  HGBA1C 6.0*   BNP    Component Value Date/Time   PROBNP 1458.0* 07/02/2013 0455  Radiology/Studies:  n/a  Physical Exam: Blood pressure 119/67, pulse 99, temperature 97.3 F (36.3 C), temperature source Oral, resp. rate 19, height 5\' 9"  (1.753 m), weight 222 lb 3.6 oz (100.8 kg), SpO2 95.00%. Weight change: -1 lb 5.2 oz (-0.6 kg)   Chest with decrease basilar breath sounds  ASSESSMENT:  1. Acute on chronic DHF  Plan:  1. IV lasix x 1  Signed, Lesleigh Noe 07/02/2013, 8:38 AM

## 2013-07-02 NOTE — Progress Notes (Signed)
EPWs removed per MD order and protocol. Wire ends intact. Pt tolerated procedure well. Pt resting in bed X 1 hour call bell within reach. Wife at bedside. Vital signs stable.Will continue to monitor.

## 2013-07-02 NOTE — Progress Notes (Signed)
Pt ambulated in hallway 200 ft with rolling walker on room air. Pt's Oxygen sats were 96 % on Room air while ambulating. Pt tolerated activity well. Will continue to monitor.

## 2013-07-03 ENCOUNTER — Encounter (HOSPITAL_COMMUNITY): Payer: Self-pay

## 2013-07-03 ENCOUNTER — Other Ambulatory Visit: Payer: Self-pay | Admitting: *Deleted

## 2013-07-03 DIAGNOSIS — I2510 Atherosclerotic heart disease of native coronary artery without angina pectoris: Secondary | ICD-10-CM

## 2013-07-03 LAB — GLUCOSE, CAPILLARY: Glucose-Capillary: 107 mg/dL — ABNORMAL HIGH (ref 70–99)

## 2013-07-03 MED ORDER — TRAMADOL HCL 50 MG PO TABS: 50.0000 mg | ORAL_TABLET | Freq: Four times a day (QID) | ORAL | Status: DC | PRN

## 2013-07-03 MED ORDER — OXYCODONE HCL 5 MG PO TABS: 5.0000 mg | ORAL_TABLET | ORAL | Status: DC | PRN

## 2013-07-03 MED ORDER — POTASSIUM CHLORIDE CRYS ER 20 MEQ PO TBCR: 20.0000 meq | EXTENDED_RELEASE_TABLET | Freq: Every day | ORAL | Status: DC

## 2013-07-03 MED ORDER — LISINOPRIL 5 MG PO TABS: 5.0000 mg | ORAL_TABLET | Freq: Every day | ORAL | Status: DC

## 2013-07-03 MED ORDER — FE FUMARATE-B12-VIT C-FA-IFC PO CAPS: 1.0000 | ORAL_CAPSULE | Freq: Two times a day (BID) | ORAL | Status: DC

## 2013-07-03 MED ORDER — FUROSEMIDE 40 MG PO TABS: 40.0000 mg | ORAL_TABLET | Freq: Every day | ORAL | Status: DC

## 2013-07-03 NOTE — Progress Notes (Signed)
NURSING PROGRESS NOTE  Tyler [name] [id number] Discharge Data: 07/03/2013 2:53 PM Attending Provider: No att. providers found PCP:No primary provider on file.     Sharin Mons to be D/C'd Home per MD order.  Discussed with the patient the After Visit Summary and all questions fully answered. All IV's and sutures were discontinued with no bleeding noted. Steri strips were applied in areas that were slightly separated All belongings returned to patient for patient to take home. Discharge instructions were explained extensively to patient's wife and daughter. All prescriptions were given and follow up appointment reminder cards.   Last Vital Signs:  Blood pressure 121/47, pulse 89, temperature 98.3 F (36.8 C), temperature source Oral, resp. rate 18, height 5\' 9"  (1.753 m), weight 99.565 kg (219 lb 8 oz), SpO2 93.00%.  Discharge Medication List   Medication List    STOP taking these medications       nitroGLYCERIN 0.4 MG SL tablet  Commonly known as:  NITROSTAT      TAKE these medications       aspirin 81 MG tablet  Take 81 mg by mouth daily.     clopidogrel 75 MG tablet  Commonly known as:  PLAVIX  Take 75 mg by mouth daily with breakfast.     ferrous fumarate-b12-vitamic C-folic acid capsule  Commonly known as:  TRINSICON / FOLTRIN  Take 1 capsule by mouth 2 (two) times daily after a meal.     FISH OIL PO  Take 100 Units by mouth daily.     folic acid 800 MCG tablet  Commonly known as:  FOLVITE  Take 800 mcg by mouth daily.     furosemide 40 MG tablet  Commonly known as:  LASIX  Take 1 tablet (40 mg total) by mouth daily. For 14 days     lisinopril 5 MG tablet  Commonly known as:  PRINIVIL,ZESTRIL  Take 1 tablet (5 mg total) by mouth daily.     metoprolol tartrate 25 MG tablet  Commonly known as:  LOPRESSOR  Take 25 mg by mouth 2 (two) times daily.     multivitamin tablet  Take 1 tablet by mouth daily.     oxyCODONE 5 MG immediate release tablet  Commonly  known as:  Oxy IR/ROXICODONE  Take 1-2 tablets (5-10 mg total) by mouth every 3 (three) hours as needed for moderate pain.     potassium chloride SA 20 MEQ tablet  Commonly known as:  K-DUR,KLOR-CON  Take 1 tablet (20 mEq total) by mouth daily. For 14 days     pravastatin 40 MG tablet  Commonly known as:  PRAVACHOL  Take 40 mg by mouth at bedtime.     terazosin 5 MG capsule  Commonly known as:  HYTRIN  Take 5 mg by mouth at bedtime.     traMADol 50 MG tablet  Commonly known as:  ULTRAM  Take 1 tablet (50 mg total) by mouth every 6 (six) hours as needed for moderate pain.     Vitamin D-3 1000 UNITS Caps  Take 1 capsule by mouth daily.

## 2013-07-03 NOTE — Discharge Summary (Signed)
patient examined and medical record reviewed,agree with above note. VAN TRIGT III,PETER 07/03/2013

## 2013-07-03 NOTE — Progress Notes (Signed)
Doing better. Dyspnea improved with good diuresis.

## 2013-07-03 NOTE — Progress Notes (Addendum)
      [address]       [place] [address]             [phone]      6 Days Post-Op Procedure(s) (LRB): CORONARY ARTERY BYPASS GRAFTING (CABG) (N/A) TRANSESOPHAGEAL ECHOCARDIOGRAM (TEE) (N/A) ENDOVEIN HARVEST OF GREATER SAPHENOUS VEIN (Right)  Subjective:  Mr. Bonaventure feels better this morning.  He states his shortness of breath has improved and he is ready to home.  + Ambulation + BM  Objective: Vital signs in last 24 hours: Temp:  [97.9 F (36.6 C)-98.4 F (36.9 C)] 98.3 F (36.8 C) (12/10 0503) Pulse Rate:  [75-93] 89 (12/10 0503) Cardiac Rhythm:  [-] Normal sinus rhythm (12/09 2120) Resp:  [18] 18 (12/10 0503) BP: (104-134)/(45-66) 121/47 mmHg (12/10 0503) SpO2:  [93 %-96 %] 93 % (12/10 0503) Weight:  [219 lb 8 oz (99.565 kg)] 219 lb 8 oz (99.565 kg) (12/10 0503)  Intake/Output from previous day: 12/09 0701 - 12/10 0700 In: 720 [P.O.:720] Out: 3000 [Urine:3000]  General appearance: alert, cooperative and no distress Heart: regular rate and rhythm Lungs: clear to auscultation bilaterally Abdomen: soft, non-tender; bowel sounds normal; no masses,  no organomegaly Extremities: edema trace, R>L, +venous stasis changes Wound: clean and dry  Lab Results:  Recent Labs  07/01/13 0555  WBC 8.8  HGB 7.8*  HCT 23.9*  PLT 146*   BMET:  Recent Labs  07/01/13 0555  NA 137  K 4.0  CL 104  CO2 23  GLUCOSE 103*  BUN 25*  CREATININE 1.01  CALCIUM 8.5    PT/INR: No results found for this basename: LABPROT, INR,  in the last 72 hours ABG    Component Value Date/Time   PHART 7.368 06/28/2013 0343   HCO3 25.1* 06/28/2013 0343   TCO2 22 06/28/2013 1703   ACIDBASEDEF 5.0* 06/28/2013 0139   O2SAT 100.0 06/28/2013 0343   CBG (last 3)   Recent Labs  07/02/13 1645 07/02/13 2134 07/03/13 0612  GLUCAP 107* 104* 107*    Assessment/Plan: S/P Procedure(s) (LRB): CORONARY ARTERY BYPASS GRAFTING (CABG) (N/A) TRANSESOPHAGEAL ECHOCARDIOGRAM (TEE)  (N/A) ENDOVEIN HARVEST OF GREATER SAPHENOUS VEIN (Right)  1. CV- NSR on Lopressor, ACE, Plavix 2. Pulm- off oxygen, dyspnea improved, continue IS 3. Renal- hypervolemia improving, weight down 3 lbs from yesterday, will continue diuresis at discharge 4. Deconditioning- Home health being arranged 5. Dispo- patient medically stable will d/c home today   LOS: 6 days    BARRETT, ERIN 07/03/2013  patient examined and medical record reviewed,agree with above note. VAN TRIGT III,PETER 07/03/2013

## 2013-07-03 NOTE — Progress Notes (Signed)
1000-1050 Cardiac Rehab Completed discharge education with pt, wife and daughter. They voice understanding. Pt agrees to Outpt. CRP in [place], will send referral. Beatrix Fetters, RN 07/03/2013 10:52 AM

## 2013-07-08 ENCOUNTER — Encounter (HOSPITAL_COMMUNITY): Payer: Self-pay | Admitting: General Practice

## 2013-07-08 ENCOUNTER — Ambulatory Visit: Payer: Medicare Other

## 2013-07-08 ENCOUNTER — Other Ambulatory Visit: Payer: Medicare Other

## 2013-07-08 ENCOUNTER — Inpatient Hospital Stay (HOSPITAL_COMMUNITY)
Admission: AD | Admit: 2013-07-08 | Discharge: 2013-07-12 | DRG: 920 | Disposition: A | Discharge status: Skilled Nursing Facility | Payer: Medicare Other | Source: Ambulatory Visit | Attending: Cardiothoracic Surgery | Admitting: Cardiothoracic Surgery

## 2013-07-08 ENCOUNTER — Ambulatory Visit (INDEPENDENT_AMBULATORY_CARE_PROVIDER_SITE_OTHER): Payer: Self-pay | Admitting: Physician Assistant

## 2013-07-08 ENCOUNTER — Inpatient Hospital Stay (HOSPITAL_COMMUNITY): Payer: Medicare Other

## 2013-07-08 ENCOUNTER — Other Ambulatory Visit: Payer: Self-pay

## 2013-07-08 VITALS — BP 105/57 | HR 86 | Resp 20 | Ht 70.0 in | Wt 215.0 lb

## 2013-07-08 DIAGNOSIS — I872 Venous insufficiency (chronic) (peripheral): Secondary | ICD-10-CM | POA: Diagnosis present

## 2013-07-08 DIAGNOSIS — Z7982 Long term (current) use of aspirin: Secondary | ICD-10-CM

## 2013-07-08 DIAGNOSIS — R5381 Other malaise: Secondary | ICD-10-CM | POA: Diagnosis present

## 2013-07-08 DIAGNOSIS — L0291 Cutaneous abscess, unspecified: Secondary | ICD-10-CM

## 2013-07-08 DIAGNOSIS — Y838 Other surgical procedures as the cause of abnormal reaction of the patient, or of later complication, without mention of misadventure at the time of the procedure: Secondary | ICD-10-CM | POA: Diagnosis present

## 2013-07-08 DIAGNOSIS — L03119 Cellulitis of unspecified part of limb: Secondary | ICD-10-CM

## 2013-07-08 DIAGNOSIS — L02419 Cutaneous abscess of limb, unspecified: Secondary | ICD-10-CM | POA: Diagnosis present

## 2013-07-08 DIAGNOSIS — M7989 Other specified soft tissue disorders: Secondary | ICD-10-CM

## 2013-07-08 DIAGNOSIS — IMO0002 Reserved for concepts with insufficient information to code with codable children: Principal | ICD-10-CM | POA: Diagnosis present

## 2013-07-08 DIAGNOSIS — Z7902 Long term (current) use of antithrombotics/antiplatelets: Secondary | ICD-10-CM

## 2013-07-08 DIAGNOSIS — Z951 Presence of aortocoronary bypass graft: Secondary | ICD-10-CM

## 2013-07-08 DIAGNOSIS — I10 Essential (primary) hypertension: Secondary | ICD-10-CM | POA: Diagnosis present

## 2013-07-08 DIAGNOSIS — I2510 Atherosclerotic heart disease of native coronary artery without angina pectoris: Secondary | ICD-10-CM

## 2013-07-08 DIAGNOSIS — L0390 Cellulitis, unspecified: Secondary | ICD-10-CM | POA: Insufficient documentation

## 2013-07-08 DIAGNOSIS — M79609 Pain in unspecified limb: Secondary | ICD-10-CM

## 2013-07-08 DIAGNOSIS — E785 Hyperlipidemia, unspecified: Secondary | ICD-10-CM | POA: Diagnosis present

## 2013-07-08 DIAGNOSIS — Z79899 Other long term (current) drug therapy: Secondary | ICD-10-CM

## 2013-07-08 DIAGNOSIS — Z8673 Personal history of transient ischemic attack (TIA), and cerebral infarction without residual deficits: Secondary | ICD-10-CM

## 2013-07-08 DIAGNOSIS — K5900 Constipation, unspecified: Secondary | ICD-10-CM | POA: Diagnosis present

## 2013-07-08 HISTORY — DX: Personal history of other diseases of the digestive system: Z87.19

## 2013-07-08 HISTORY — DX: Cerebral infarction, unspecified: I63.9

## 2013-07-08 HISTORY — DX: Gastro-esophageal reflux disease without esophagitis: K21.9

## 2013-07-08 LAB — BASIC METABOLIC PANEL
BUN: 23 mg/dL (ref 6–23)
Creatinine, Ser: 1.23 mg/dL (ref 0.50–1.35)
GFR calc Af Amer: 61 mL/min — ABNORMAL LOW (ref >90)
GFR calc non Af Amer: 53 mL/min — ABNORMAL LOW (ref >90)
Potassium: 4.9 mEq/L (ref 3.5–5.1)

## 2013-07-08 LAB — CBC
HCT: 26.4 % — ABNORMAL LOW (ref 39.0–52.0)
MCHC: 31.4 g/dL (ref 30.0–36.0)
Platelets: 324 10*3/uL (ref 150–400)
RBC: 2.99 MIL/uL — ABNORMAL LOW (ref 4.22–5.81)
RDW: 17.1 % — ABNORMAL HIGH (ref 11.5–15.5)

## 2013-07-08 MED ORDER — TERAZOSIN HCL 5 MG PO CAPS
5.0000 mg | ORAL_CAPSULE | Freq: Every day | ORAL | Status: DC
  Administered 2013-07-08 – 2013-07-11 (×4): 5 mg via ORAL
  Filled 2013-07-08 (×5): qty 1

## 2013-07-08 MED ORDER — LISINOPRIL 5 MG PO TABS
5.0000 mg | ORAL_TABLET | Freq: Every day | ORAL | Status: DC
  Administered 2013-07-09 – 2013-07-12 (×4): 5 mg via ORAL
  Filled 2013-07-08 (×4): qty 1

## 2013-07-08 MED ORDER — CLOPIDOGREL BISULFATE 75 MG PO TABS
75.0000 mg | ORAL_TABLET | Freq: Every day | ORAL | Status: DC
  Administered 2013-07-09 – 2013-07-12 (×4): 75 mg via ORAL
  Filled 2013-07-08 (×5): qty 1

## 2013-07-08 MED ORDER — METOPROLOL TARTRATE 25 MG PO TABS
25.0000 mg | ORAL_TABLET | Freq: Two times a day (BID) | ORAL | Status: DC
  Administered 2013-07-08 – 2013-07-12 (×8): 25 mg via ORAL
  Filled 2013-07-08 (×9): qty 1

## 2013-07-08 MED ORDER — ONDANSETRON HCL 4 MG/2ML IJ SOLN: 4.0000 mg | Freq: Four times a day (QID) | INTRAMUSCULAR | Status: DC | PRN

## 2013-07-08 MED ORDER — ENOXAPARIN SODIUM 40 MG/0.4ML ~~LOC~~ SOLN
40.0000 mg | SUBCUTANEOUS | Status: DC
  Administered 2013-07-08 – 2013-07-11 (×4): 40 mg via SUBCUTANEOUS
  Filled 2013-07-08 (×5): qty 0.4

## 2013-07-08 MED ORDER — POTASSIUM CHLORIDE CRYS ER 10 MEQ PO TBCR
10.0000 meq | EXTENDED_RELEASE_TABLET | Freq: Two times a day (BID) | ORAL | Status: DC
  Administered 2013-07-08 – 2013-07-12 (×8): 10 meq via ORAL
  Filled 2013-07-08 (×9): qty 1

## 2013-07-08 MED ORDER — OXYCODONE HCL 5 MG PO TABS
5.0000 mg | ORAL_TABLET | ORAL | Status: DC | PRN
  Administered 2013-07-08: 10 mg via ORAL
  Filled 2013-07-08: qty 2

## 2013-07-08 MED ORDER — FUROSEMIDE 40 MG PO TABS
40.0000 mg | ORAL_TABLET | Freq: Every day | ORAL | Status: DC
  Administered 2013-07-09 – 2013-07-12 (×4): 40 mg via ORAL
  Filled 2013-07-08 (×4): qty 1

## 2013-07-08 MED ORDER — ASPIRIN EC 81 MG PO TBEC
81.0000 mg | DELAYED_RELEASE_TABLET | Freq: Every day | ORAL | Status: DC
  Administered 2013-07-09 – 2013-07-12 (×4): 81 mg via ORAL
  Filled 2013-07-08 (×4): qty 1

## 2013-07-08 MED ORDER — PIPERACILLIN-TAZOBACTAM 3.375 G IVPB
3.3750 g | Freq: Three times a day (TID) | INTRAVENOUS | Status: DC
  Administered 2013-07-09 – 2013-07-12 (×11): 3.375 g via INTRAVENOUS
  Filled 2013-07-08 (×16): qty 50

## 2013-07-08 MED ORDER — VANCOMYCIN HCL 10 G IV SOLR
1250.0000 mg | Freq: Two times a day (BID) | INTRAVENOUS | Status: DC
  Administered 2013-07-09 – 2013-07-10 (×4): 1250 mg via INTRAVENOUS
  Filled 2013-07-08 (×6): qty 1250

## 2013-07-08 MED ORDER — SIMVASTATIN 20 MG PO TABS
20.0000 mg | ORAL_TABLET | Freq: Every day | ORAL | Status: DC
  Administered 2013-07-09 – 2013-07-11 (×3): 20 mg via ORAL
  Filled 2013-07-08 (×5): qty 1

## 2013-07-08 MED ORDER — ACETAMINOPHEN 325 MG PO TABS
650.0000 mg | ORAL_TABLET | Freq: Four times a day (QID) | ORAL | Status: DC | PRN
  Administered 2013-07-09: 650 mg via ORAL
  Filled 2013-07-08: qty 2

## 2013-07-08 MED ORDER — DOCUSATE SODIUM 100 MG PO CAPS
100.0000 mg | ORAL_CAPSULE | Freq: Two times a day (BID) | ORAL | Status: DC | PRN
  Administered 2013-07-08 – 2013-07-10 (×2): 100 mg via ORAL
  Filled 2013-07-08 (×2): qty 1

## 2013-07-08 NOTE — Progress Notes (Signed)
  HPI: Patient returns for routine postoperative follow-up having undergone CABG x 4 on 06/27/2013.  His hospital stay was uncomplicated.  However the patient was experiencing increased RLE pain and swelling and it was felt he should be evaluated in the office for a wound check today.  The patient states that he feels his RLE is more edematous and painful.  He is accompanied by his wife who is very angry the patient was discharged home from the hospital.  She states she is unable to care for him and wants him placed in a facility until he is better.   Current Outpatient Prescriptions  Medication Sig Dispense Refill  . aspirin 81 MG tablet Take 81 mg by mouth daily.      . Cholecalciferol (VITAMIN D-3) 1000 UNITS CAPS Take 1 capsule by mouth daily.       . clopidogrel (PLAVIX) 75 MG tablet Take 75 mg by mouth daily with breakfast.      . ferrous fumarate-b12-vitamic C-folic acid (TRINSICON / FOLTRIN) capsule Take 1 capsule by mouth 2 (two) times daily after a meal.  60 capsule  0  . folic acid (FOLVITE) 800 MCG tablet Take 800 mcg by mouth daily.       . furosemide (LASIX) 40 MG tablet Take 1 tablet (40 mg total) by mouth daily. For 14 days  14 tablet  0  . lisinopril (PRINIVIL,ZESTRIL) 5 MG tablet Take 1 tablet (5 mg total) by mouth daily.  30 tablet  3  . metoprolol tartrate (LOPRESSOR) 25 MG tablet Take 25 mg by mouth 2 (two) times daily.      . Multiple Vitamin (MULTIVITAMIN) tablet Take 1 tablet by mouth daily.      . Omega-3 Fatty Acids (FISH OIL PO) Take 100 Units by mouth daily.       [name] oxyCODONE (OXY IR/ROXICODONE) 5 MG immediate release tablet Take 1-2 tablets (5-10 mg total) by mouth every 3 (three) hours as needed for moderate pain.  30 tablet  0  . potassium chloride SA (K-DUR,KLOR-CON) 20 MEQ tablet Take 1 tablet (20 mEq total) by mouth daily. For 14 days  7 tablet  0  . pravastatin (PRAVACHOL) 40 MG tablet Take 40 mg by mouth at bedtime.       [name] terazosin (HYTRIN) 5 MG capsule Take 5  mg by mouth at bedtime.      . traMADol (ULTRAM) 50 MG tablet Take 1 tablet (50 mg total) by mouth every 6 (six) hours as needed for moderate pain.  30 tablet  0   No current facility-administered medications for this visit.    Physical Exam:  BP 105/57  Pulse 86  Resp 20  Ht 5\' 10"  (1.778 m)  Wt 215 lb (97.523 kg)  BMI 30.85 kg/m2  SpO2 94%  Gen: no apparent distress Heart: RRR Lungs CTA bilaterally RLE: 2-3+ pitting edema, erythematous, extremely tender to palpation, swelling on R than left Incisions: clean and dry  Diagnostic Tests:  Will obtain LE venous duplex  A/P;  1. Tyler [name] returns for follow up of RLE pain and swelling.  This appears to have worsened since hospital discharge.  This very well could be a DVT and LE venous duplex will be obtained.  However, being the limb is warm and tender to touch with erythema and skin sloughing could easily be Cellulitis.  I spoke with Dr. Tyrone Sage whom evaluated the patient as well and is in agreement that patient will be admitted for IV ABX

## 2013-07-08 NOTE — Progress Notes (Signed)
VASCULAR LAB PRELIMINARY  PRELIMINARY  PRELIMINARY  PRELIMINARY  Bilateral lower extremity venous duplex completed.    Preliminary report: No obvious deep or superficial vein thrombosis noted bilaterally.  The exam was technically limited due to extreme guarding of right leg and decreased visualization.  There is what appears to be  irregular fluid filled areas along the course of the recently harvested greater saphenous vein on right.[name]    Akaisha Truman, RVT 07/08/2013, 5:15 PM

## 2013-07-08 NOTE — Progress Notes (Signed)
ANTIBIOTIC CONSULT NOTE - INITIAL  Pharmacy Consult for Vancomycin, Zosyn Indication: cellulitis post op CABG  No Known Allergies  Patient Measurements: Height: 5' 10.08" (178 cm) Weight: 214 lb 15.2 oz (97.5 kg) IBW/kg (Calculated) : 73.18   Microbiology: Recent Results (from the past 720 hour(s))  MRSA PCR SCREENING     Status: None   Collection Time    06/27/13  7:17 PM      Result Value Range Status   MRSA by PCR NEGATIVE  NEGATIVE Final   Comment:            The GeneXpert MRSA Assay (FDA     approved for NASAL specimens     only), is one component of a     comprehensive MRSA colonization     surveillance program. It is not     intended to diagnose MRSA     infection nor to guide or     monitor treatment for     MRSA infections.    Medical History: Past Medical History  Diagnosis Date  . Coronary atherosclerosis   . Osteoarthritis   . Hypertension   . Hyperlipidemia   . Erectile dysfunction   . CVA (cerebral infarction)    Assessment: 79 year old male s/p CABG x 4 on 06/27/13 returned for a follow-up appointment with increased RLE pain and swelling.  Pharmacy asked to begin broad spectrum antibiotics for cellulitis.  Goal of Therapy:  Vancomycin trough level 10-15 mcg/ml  Plan:  1) Zosyn 3.375 grams iv Q 8 hours - 4 hr infusion 2) Vancomycin 1250 mg iv Q 12 hours 3) Follow up fever trend, Scr, cultures, progress  Thank you. Okey Regal, PharmD [phone]  07/08/2013,3:09 PM

## 2013-07-08 NOTE — Progress Notes (Signed)
Right leg incision painted with betadine and cover with a dry gauze.

## 2013-07-09 MED ORDER — HYDROCODONE-ACETAMINOPHEN 5-325 MG PO TABS
1.0000 | ORAL_TABLET | ORAL | Status: DC | PRN
  Administered 2013-07-09 – 2013-07-12 (×9): 1 via ORAL
  Filled 2013-07-09 (×9): qty 1

## 2013-07-09 MED ORDER — LACTULOSE 10 GM/15ML PO SOLN
20.0000 g | Freq: Every day | ORAL | Status: DC | PRN
  Administered 2013-07-09: 20 g via ORAL
  Filled 2013-07-09 (×2): qty 30

## 2013-07-09 MED ORDER — FE FUMARATE-B12-VIT C-FA-IFC PO CAPS
1.0000 | ORAL_CAPSULE | Freq: Three times a day (TID) | ORAL | Status: DC
  Administered 2013-07-09 – 2013-07-12 (×10): 1 via ORAL
  Filled 2013-07-09 (×14): qty 1

## 2013-07-09 NOTE — Evaluation (Signed)
Physical Therapy Evaluation Patient Details Name: Tyler [name] MRN: [id number] DOB: 18-Feb-1929 Today's Date: 07/09/2013 Time: 1030-1050 PT Time Calculation (min): 20 min  PT Assessment / Plan / Recommendation History of Present Illness  Patient is an 79 yo male admitted with cellulitis left LE after CABG.  Also with difficulty managing at home with wife assist.  Clinical Impression  Patient presents with decreased independence with mobility due to deficits listed below and will benefit from skilled PT in the acute setting to maximize independence and allow return home following SNF rehab stay.    PT Assessment  Patient needs continued PT services    Follow Up Recommendations  SNF    Does the patient have the potential to tolerate intense rehabilitation    N/A  Barriers to Discharge Decreased caregiver support wife reports difficulty caring for pt at home    Equipment Recommendations  None recommended by PT    Recommendations for Other Services   OT  Frequency Min 3X/week    Precautions / Restrictions Precautions Precautions: Sternal;Fall   Pertinent Vitals/Pain 10/10 left LE with ambulation (5/10 at rest)      Mobility  Bed Mobility Bed Mobility: Rolling Left;Supine to Sit;Sitting - Scoot to [place] of Bed Rolling Left: 4: Min assist Supine to Sit: 3: Mod assist;With rails;HOB elevated Sitting - Scoot to [place] of Bed: 4: Min assist Details for Bed Mobility Assistance: assist and cues to roll to left for side to sit, but pt coming up prior to getting over; increased time and cues for pt to assist with scooting out left hip Transfers Transfers: Sit to Stand;Stand to Sit Sit to Stand: 4: Min assist;From bed Stand to Sit: 4: Min assist;To chair/3-in-1 Details for Transfer Assistance: cues for momentum to assist with standing, and for safety returning to sit Ambulation/Gait Ambulation/Gait Assistance: 4: Min assist Ambulation Distance (Feet): 15 Feet Assistive device:  Rolling walker Ambulation/Gait Assistance Details: pain with left weight bearing limited distance Gait Pattern: Antalgic;Trunk flexed;Decreased stride length;Shuffle    Exercises General Exercises - Lower Extremity Ankle Circles/Pumps: AROM;Both;10 reps Heel Slides: AAROM;5 reps;Left   PT Diagnosis: Generalized weakness;Acute pain;Difficulty walking;Abnormality of gait  PT Problem List: Decreased strength;Decreased balance;Decreased activity tolerance;Pain;Decreased knowledge of use of DME;Decreased knowledge of precautions;Decreased mobility PT Treatment Interventions: DME instruction;Gait training;Therapeutic exercise;Therapeutic activities;Patient/family education;Balance training;Functional mobility training     PT Goals(Current goals can be found in the care plan section) Acute Rehab PT Goals Patient Stated Goal: To get rehab prior to going home PT Goal Formulation: With patient/family Time For Goal Achievement: 07/23/13 Potential to Achieve Goals: Good  Visit Information  Last PT Received On: 07/09/13 Assistance Needed: +1 History of Present Illness: Patient is an 79 yo male admitted with cellulitis left LE after CABG.  Also with difficulty managing at home with wife assist.       Prior Functioning  Home Living Family/patient expects to be discharged to:: Skilled nursing facility Living Arrangements: Spouse/significant other Available Help at Discharge: Family Type of Home: House Entrance Stairs-Number of Steps: 2 Entrance Stairs-Rails: None Home Layout: Two level;Able to live on main level with bedroom/bathroom Home Equipment: [place] - single point Prior Function Level of Independence: Independent Communication Communication: HOH    Cognition  Cognition Arousal/Alertness: Awake/alert Behavior During Therapy: WFL for tasks assessed/performed Overall Cognitive Status: Within Functional Limits for tasks assessed    Extremity/Trunk Assessment Upper Extremity  Assessment Upper Extremity Assessment: Overall WFL for tasks assessed Lower Extremity Assessment Lower Extremity Assessment: RLE deficits/detail RLE Deficits /  Details: limited AROM due to pain and edema, (but pt reports improved after drained this am) Cervical / Trunk Assessment Cervical / Trunk Assessment: Kyphotic   Balance Balance Balance Assessed: Yes Static Sitting Balance Static Sitting - Balance Support: Bilateral upper extremity supported;Feet unsupported Static Sitting - Level of Assistance: 4: Min assist Static Sitting - Comment/# of Minutes: for safety leaning back while feet unsupported Static Standing Balance Static Standing - Balance Support: Bilateral upper extremity supported Static Standing - Level of Assistance: 4: Min assist  End of Session PT - End of Session Equipment Utilized During Treatment: Gait belt Activity Tolerance: Patient limited by pain Patient left: in chair;with family/visitor present;with nursing/sitter in room Nurse Communication: Mobility status  GP     Kyleen Villatoro,CYNDI 07/09/2013, 11:04 AM Sheran Lawless, PT [phone] 07/09/2013

## 2013-07-09 NOTE — Progress Notes (Signed)
      [address]       [place] [address]             [phone]      PROCEDURE NOTE:  Pre-op diagnosis- Right leg cellulitis, fluid collection at RLE [facility] site  Procedure: The area around the right lower extremity EVH site was prepped and draped in sterile fashion and anesthetized with 5 ml 2% Xylocaine.  A 21 g needle was used to aspirate the fluid collection, yielding around 10 ml of old blood. No purulence was noted.  Several passes were made and no additional fluid could be aspirated. Specimen sent for routine culture.  The patient tolerated the procedure well.   Coral Ceo, PA-C 07/09/2013 9:25 am

## 2013-07-09 NOTE — Care Management Note (Signed)
    Page 1 of 1   07/12/2013     2:32:39 PM   CARE MANAGEMENT NOTE 07/12/2013  Patient:  Tyler [name], Tyler [name]   Account Number:  [id number]  Date Initiated:  07/09/2013  Documentation initiated by:  Taleeya Blondin  Subjective/Objective Assessment:   PT ADM ON 12/15 WITH RT LEG CELLULITIS S/P CABG ON 12/4. PTA, PT RESIDES AT HOME WITH SPOUSE.  PT ACTIVE WITH [facility] FOR HH NEEDS.     Action/Plan:   WIFE STATES SHE CANNOT CARE FOR PT AT HOME; WILL NEED SNF PLACEMENT.  WILL REFER TO CSW TO FACILITATE DC TO SNF WHEN MEDICALLY STABLE FOR DC.   Anticipated DC Date:  07/12/2013   Anticipated DC Plan:  SKILLED NURSING FACILITY  In-house referral  Clinical Social Worker      DC Planning Services  CM consult      Choice offered to / List presented to:             Status of service:  Completed, signed off Medicare Important Message given?   (If response is "NO", the following Medicare IM given date fields will be blank) Date Medicare IM given:   Date Additional Medicare IM given:    Discharge Disposition:  SKILLED NURSING FACILITY  Per UR Regulation:  Reviewed for med. necessity/level of care/duration of stay  If discussed at Long Length of Stay Meetings, dates discussed:    Comments:  07/12/13 Leilah Polimeni,RN,BSN [phone] PT DISCHARGING TO [facility] TODAY, PER CSW ARRANGEMENTS.

## 2013-07-09 NOTE — Progress Notes (Signed)
      [address]       [place] [address]             [phone]      Subjective:  Mr. Grantz has no new complaints this morning.  He states his leg feels a little better than it did yesterday.  He is requesting something to help move his bowels.  Objective: Vital signs in last 24 hours: Temp:  [97.2 F (36.2 C)-98.6 F (37 C)] 98.6 F (37 C) (12/16 0436) Pulse Rate:  [86-93] 87 (12/16 0436) Cardiac Rhythm:  [-] Normal sinus rhythm (12/15 1600) Resp:  [18-20] 18 (12/16 0436) BP: (105-124)/(57-76) 120/57 mmHg (12/16 0436) SpO2:  [93 %-98 %] 93 % (12/16 0436) Weight:  [208 lb 12.4 oz (94.7 kg)-215 lb (97.523 kg)] 208 lb 12.4 oz (94.7 kg) (12/16 0436)  Intake/Output from previous day: 12/15 0701 - 12/16 0700 In: 360 [P.O.:360] Out: 400 [Urine:400]  General appearance: alert, cooperative and no distress Heart: regular rate and rhythm Lungs: diminished breath sounds left base Abdomen: soft, non-tender; bowel sounds normal; no masses,  no organomegaly Extremities: edema 1-2+ RLE, + erythema improved from yesterday, skin sloughing on RLE Wound: clean and dry  Lab Results:  Recent Labs  07/08/13 1810  WBC 11.1*  HGB 8.3*  HCT 26.4*  PLT 324   BMET:  Recent Labs  07/08/13 1810  NA 136  K 4.9  CL 99  CO2 28  GLUCOSE 118*  BUN 23  CREATININE 1.23  CALCIUM 8.6    PT/INR: No results found for this basename: LABPROT, INR,  in the last 72 hours ABG    Component Value Date/Time   PHART 7.368 06/28/2013 0343   HCO3 25.1* 06/28/2013 0343   TCO2 22 06/28/2013 1703   ACIDBASEDEF 5.0* 06/28/2013 0139   O2SAT 100.0 06/28/2013 0343   CBG (last 3)  No results found for this basename: GLUCAP,  in the last 72 hours  Assessment/Plan:  1. RLE Cellulitis- continue Vanc, Zosyn- there is improvement is erythema and pain in RLE, Duplex negative for DVT, however there were several fluid collections present.  I have been unable to palpate any definite  seroma 2. CV- S/P CABG 12/4- NSR continue Lopressor, Lisinopril, ASA, Statin 3. H/O Stroke- continue Plavix 4. GI- Constipation, Lactulose prn  5. Deconditioning- patient's wife very unhappy patient was discharged home after first surgery.  She states she is unable to care for him and is requesting SNF placement- will obtain PT, Social Work consult 6. Dispo- no signs of abscess in RLE, remains Afebrile, No Leukocytosis, improvement of RLE erythema and pain with use of ABX   LOS: 1 day    Raford Pitcher, Toriann Spadoni 07/09/2013

## 2013-07-09 NOTE — Progress Notes (Signed)
Clinical Social Work Department CLINICAL SOCIAL WORK PLACEMENT NOTE 07/09/2013  Patient:  Tyler [name], Tyler [name]  Account Number:  [id number] Admit date:  07/08/2013  Clinical Social Worker:  Carren Rang  Date/time:  07/09/2013 01:24 PM  Clinical Social Work is seeking post-discharge placement for this patient at the following level of care:   SKILLED NURSING   (*CSW will update this form in Epic as items are completed)   07/09/2013  Patient/family provided with [facility] Department of Clinical Social Work's list of facilities offering this level of care within the geographic area requested by the patient (or if unable, by the patient's family).  07/09/2013  Patient/family informed of their freedom to choose among providers that offer the needed level of care, that participate in Medicare, Medicaid or managed care program needed by the patient, have an available bed and are willing to accept the patient.  07/09/2013  Patient/family informed of [facility]' ownership interest in [facility], as well as of the fact that they are under no obligation to receive care at this facility.  PASARR submitted to EDS on 07/09/2013 PASARR number received from [employer]DS on 07/09/2013  FL2 transmitted to all facilities in geographic area requested by pt/family on  07/09/2013 FL2 transmitted to all facilities within larger geographic area on   Patient informed that his/her managed care company has contracts with or will negotiate with  certain facilities, including the following:     Patient/family informed of bed offers received:   Patient chooses bed at  Physician recommends and patient chooses bed at    Patient to be transferred to  on   Patient to be transferred to facility by   The following physician request were entered in Epic:   Additional Comments:  Maree Krabbe, MSW, [employer] [phone]

## 2013-07-09 NOTE — Progress Notes (Signed)
Clinical Social Work Department BRIEF PSYCHOSOCIAL ASSESSMENT 07/09/2013  Patient:  Tyler [name], Tyler [name]     Account Number:  [id number]     Admit date:  07/08/2013  Clinical Social Worker:  Carren Rang  Date/Time:  07/09/2013 01:22 PM  Referred by:  Physician  Date Referred:  07/09/2013 Referred for  SNF Placement   Other Referral:   Interview type:  Patient Other interview type:    PSYCHOSOCIAL DATA Living Status:  WIFE Admitted from facility:   Level of care:   Primary support name:  Asher Torpey [phone] Primary support relationship to patient:  SPOUSE Degree of support available:   Good    CURRENT CONCERNS Current Concerns  Post-Acute Placement   Other Concerns:    SOCIAL WORK ASSESSMENT / PLAN Clinical Social Worker received referral for SNF placement at d/c. CSW introduced self and explained reason for visit. CSW explained SNF process and provided SNF packet to patient.  Patient reported he is agreeable for SNF placement and he wants something close to his house so it makes it easy for his wife to visit. CSW left facility list for wife to look at when she comes to visit patient. Patient stated his wife will be at the hospital later today.    CSW will complete FL2 for MD's signature and will update patient and family when bed offers are received.   Assessment/plan status:  Psychosocial Support/Ongoing Assessment of Needs Other assessment/ plan:   Information/referral to community resources:   SNF information/csw contact info    PATIENT'S/FAMILY'S RESPONSE TO PLAN OF CARE: Patient is agreeable to SNF placement.       Maree Krabbe, MSW, [facility] [phone]

## 2013-07-09 NOTE — Progress Notes (Signed)
CARDIAC REHAB PHASE I   PRE:  Rate/Rhythm: 81SR  BP:  Supine:   Sitting: `100/50  Standing:    SaO2: 92%RA  MODE:  Ambulation: 240 ft   POST:  Rate/Rhythm: 87 SR  BP:  Supine: 120/56  Sitting:   Standing:    SaO2: 94%RA 1330-1404 Pt hesitant to walk but with encouragement pt willing to go. Walked 240 ft on RA with gait belt use, rolling walker and asst x2. Encouraged pt to stand upright. C/o right leg soreness. Putting pressure on arms. Encouraged pt to not bear weight on arms. To bed after walk.   Luetta Nutting, RN BSN  07/09/2013 2:01 PM

## 2013-07-10 ENCOUNTER — Telehealth: Payer: Self-pay

## 2013-07-10 ENCOUNTER — Ambulatory Visit: Payer: Medicare Other | Admitting: Cardiothoracic Surgery

## 2013-07-10 DIAGNOSIS — L0291 Cutaneous abscess, unspecified: Secondary | ICD-10-CM

## 2013-07-10 LAB — VANCOMYCIN, TROUGH: Vancomycin Tr: 19.6 ug/mL (ref 10.0–20.0)

## 2013-07-10 MED ORDER — VANCOMYCIN HCL 10 G IV SOLR
1500.0000 mg | INTRAVENOUS | Status: DC
  Administered 2013-07-11: 1500 mg via INTRAVENOUS
  Filled 2013-07-10 (×2): qty 1500

## 2013-07-10 MED ORDER — BISACODYL 10 MG RE SUPP
10.0000 mg | Freq: Every day | RECTAL | Status: DC | PRN
  Administered 2013-07-10 – 2013-07-11 (×2): 10 mg via RECTAL
  Filled 2013-07-10 (×2): qty 1

## 2013-07-10 NOTE — Progress Notes (Addendum)
ANTIBIOTIC CONSULT NOTE - FOLLOW UP  Pharmacy Consult for vancomycin and Zosyn Indication: cellulitis  No Known Allergies  Patient Measurements: Height: 5' 10.08" (178 cm) Weight: 217 lb 2.5 oz (98.5 kg) IBW/kg (Calculated) : 73.18   Vital Signs: Temp: 98 F (36.7 C) (12/17 1349) Temp src: Oral (12/17 1349) BP: 105/62 mmHg (12/17 1349) Pulse Rate: 74 (12/17 1349) Intake/Output from previous day: 12/16 0701 - 12/17 0700 In: 480 [P.O.:480] Out: 1100 [Urine:1100] Intake/Output from this shift: Total I/O In: 600 [P.O.:600] Out: 1451 [Urine:1450; Stool:1]  Labs:  Recent Labs  07/08/13 1810  WBC 11.1*  HGB 8.3*  PLT 324  CREATININE 1.23   Estimated Creatinine Clearance: 54.6 ml/min (by C-G formula based on Cr of 1.23).  Recent Labs  07/10/13 1630  VANCOTROUGH 19.6     Microbiology: Recent Results (from the past 720 hour(s))  MRSA PCR SCREENING     Status: None   Collection Time    06/27/13  7:17 PM      Result Value Range Status   MRSA by PCR NEGATIVE  NEGATIVE Final   Comment:            The GeneXpert MRSA Assay (FDA     approved for NASAL specimens     only), is one component of a     comprehensive MRSA colonization     surveillance program. It is not     intended to diagnose MRSA     infection nor to guide or     monitor treatment for     MRSA infections.  CULTURE, ROUTINE-ABSCESS     Status: None   Collection Time    07/09/13  9:58 AM      Result Value Range Status   Specimen Description ABSCESS LEG RIGHT   Final   Special Requests NONE   Final   Gram Stain     Final   Value: ABUNDANT WBC PRESENT, PREDOMINANTLY PMN     NO SQUAMOUS EPITHELIAL CELLS SEEN     NO ORGANISMS SEEN     Performed at [facility]   Culture     Final   Value: NO GROWTH 1 DAY     Performed at [facility]   Report Status PENDING   Incomplete    Anti-infectives   Start     Dose/Rate Route Frequency Ordered Stop   07/08/13 1700  vancomycin (VANCOCIN)  1,250 mg in sodium chloride 0.9 % 250 mL IVPB     1,250 mg 166.7 mL/hr over 90 Minutes Intravenous Every 12 hours 07/08/13 1514     07/08/13 1600  piperacillin-tazobactam (ZOSYN) IVPB 3.375 g     3.375 g 12.5 mL/hr over 240 Minutes Intravenous Every 8 hours 07/08/13 1514        Assessment: 84 YOM continues on broad spectrum antibiotics for cellulitis. Renal function has been declining with an increased SCr and reduced CrCl and UOP. A vancomycin trough drawn appropriately this evening was too elevated for cellulitis goals at 19.63mcg/mL. New estimate of Ke with new CrCl is 0.05 with an estimated half-life of ~24hrs. Currently CrCl 84mL/min, SCr 1.2mg /dL. This evening's dose has already been hung.  Goal of Therapy:  Vancomycin trough level 10-15 mcg/ml  Plan:  1. Change to vancomycin 1500mg  IV q24h starting 12/18 at 1700 2. Continue Zosyn 3.375gm IV q8h 3. Follow renal function, c/s and ability to de-escalate, LOT, and trough at new SS  Jymir Dunaj D. Laini Urick, PharmD, BCPS Clinical Pharmacist Pager: [phone] 07/10/2013  5:48 PM

## 2013-07-10 NOTE — Progress Notes (Signed)
CSW received phone call from patient's wife, stating that they are wanting to go with [facility] for SNF. CSW called [facility] and has confirmed a bed for when patient is medically ready.  Maree Krabbe, MSW, [facility] [phone]

## 2013-07-10 NOTE — Progress Notes (Signed)
Pt has declined ambulation on x2 occasions today. Sts he was up with diarrhea and didn't sleep last night. Also sts he wants more pain meds. Pt adamantly refused to walk when encouragement attempted. Sts he is not moving. Pt asked [place] to make him comfortable, change his tv channel and put his glasses on. Did not want to get arms out of covers. Offered to return and walk with pt at 3 pm. He stated this would be too early. Will f/u tomorrow. Ethelda Chick CES, ACSM 1:35 PM 07/10/2013

## 2013-07-10 NOTE — Telephone Encounter (Signed)
Cardiac rehab form [facility]ed to [facility] [phone] 

## 2013-07-10 NOTE — Progress Notes (Addendum)
[name]       [name] [address]             [phone]           Subjective: Leg feeling a bit better, only c/o is constipation which is a chronic problem. Denies Abd pain, + passing flatus  Objective  Telemetry sinus rhythm/tachy, occ PVC's  Temp:  [97.2 F (36.2 C)-98.2 F (36.8 C)] 98 F (36.7 C) (12/17 0555) Pulse Rate:  [78-86] 78 (12/17 0555) Resp:  [17-18] 18 (12/17 0555) BP: (94-119)/(48-74) 119/74 mmHg (12/17 0555) SpO2:  [92 %-96 %] 92 % (12/17 0555) Weight:  [217 lb 2.5 oz (98.5 kg)] 217 lb 2.5 oz (98.5 kg) (12/17 0555)   Intake/Output Summary (Last 24 hours) at 07/10/13 0830 Last data filed at 07/09/13 1700  Gross per 24 hour  Intake    480 ml  Output   1100 ml  Net   -620 ml       General appearance: alert, cooperative and no distress Heart: regular rate and rhythm Lungs: clear to auscultation bilaterally Abdomen: + BS, soft, non distended, nontender Extremities: chronic venous stasis changes, + edema, no erethema, + TTP over pre-tibial region, no drainage from incision Wound: healing well  Lab Results:  Recent Labs  07/08/13 1810  NA 136  K 4.9  CL 99  CO2 28  GLUCOSE 118*  BUN 23  CREATININE 1.23  CALCIUM 8.6   No results found for this basename: AST, ALT, ALKPHOS, BILITOT, PROT, ALBUMIN,  in the last 72 hours No results found for this basename: LIPASE, AMYLASE,  in the last 72 hours  Recent Labs  07/08/13 1810  WBC 11.1*  HGB 8.3*  HCT 26.4*  MCV 88.3  PLT 324   No results found for this basename: CKTOTAL, CKMB, TROPONINI,  in the last 72 hours No components found with this basename: POCBNP,  No results found for this basename: DDIMER,  in the last 72 hours No results found for this basename: HGBA1C,  in the last 72 hours No results found for this basename: CHOL, HDL, LDLCALC, TRIG, CHOLHDL,  in the last 72 hours No results found for this basename: TSH, T4TOTAL, FREET3, T3FREE, THYROIDAB,  in the  last 72 hours No results found for this basename: VITAMINB12, FOLATE, FERRITIN, TIBC, IRON, RETICCTPCT,  in the last 72 hours  Medications: Scheduled . aspirin EC  81 mg Oral Daily  . clopidogrel  75 mg Oral Q breakfast  . enoxaparin (LOVENOX) injection  40 mg Subcutaneous Q24H  . ferrous fumarate-b12-vitamic C-folic acid  1 capsule Oral TID PC  . furosemide  40 mg Oral Daily  . lisinopril  5 mg Oral Daily  . metoprolol tartrate  25 mg Oral BID  . piperacillin-tazobactam (ZOSYN)  IV  3.375 g Intravenous Q8H  . potassium chloride  10 mEq Oral BID  . simvastatin  20 mg Oral q1800  . terazosin  5 mg Oral QHS  . vancomycin  1,250 mg Intravenous Q12H     Radiology/Studies:  Dg Chest 2 View  07/08/2013   CLINICAL DATA:  Status post coronary bypass grafting  EXAM: CHEST  2 VIEW  COMPARISON:  07/01/2013  FINDINGS: Cardiac shadow is stable. Postsurgical changes are again noted. The lungs are well aerated. A small left-sided pleural effusion is again noted and stable. No new focal infiltrate is seen.  IMPRESSION: Chronic changes without acute abnormality.   Electronically Signed  By: Alcide Clever M.D.   On: 07/08/2013 16:43    INR: Will add last result for INR, ABG once components are confirmed Will add last 4 CBG results once components are confirmed  Assessment/Plan: Stable on current abx, poss convert to po soon Will give suppos for constipation SW involved for placement   LOS: 2 days    Tyler [name],Tyler [name] 12/17/20148:30 AM  Follow culture results of leg aspirate Continue cardiac rehabilitation phase 1

## 2013-07-11 DIAGNOSIS — I2510 Atherosclerotic heart disease of native coronary artery without angina pectoris: Secondary | ICD-10-CM

## 2013-07-11 MED ORDER — LIDOCAINE HCL (PF) 1 % IJ SOLN
5.0000 mL | Freq: Once | INTRAMUSCULAR | Status: AC
  Administered 2013-07-11: 5 mL
  Filled 2013-07-11: qty 5

## 2013-07-11 NOTE — Progress Notes (Signed)
CSW spoke to [facility] about possible [employer]. [facility] stated that they could take the patient. CSW updated patient on bed offer. Patient agreeable.  Maree Krabbe, MSW, [facility] [phone]

## 2013-07-11 NOTE — Progress Notes (Signed)
Improving but deconditioned. Looks forward to SNF rehab then return home.

## 2013-07-11 NOTE — Progress Notes (Signed)
PT Cancellation Note  Patient Details Name: Tyler [name] MRN: [id number] DOB: [birth year]   Cancelled Treatment:    Reason Eval/Treat Not Completed: Patient at procedure or test/unavailable--pt eating dinner.   Ashley Bultema 07/11/2013, 4:51 PM Pager [phone]

## 2013-07-11 NOTE — Progress Notes (Signed)
CARDIAC REHAB PHASE I   PRE:  Rate/Rhythm: 90 SR    BP: sitting 104/50    SaO2: 93 RA  MODE:  Ambulation: 180 ft   POST:  Rate/Rhythm: 128 ST    BP: sitting 120/70     SaO2: 95 RA  Pt reluctant to walk. Sts he didn't get much sleep and leg hurts (has not had any pain meds this am). Difficult to get to EOB due to pts lack of effort. He sts his hips don't twist to assist getting to EOB. With ambulation limped at first but this improved. Rest of walk pt took small steps, leans over RW somewhat. HR up to 128 ST walking. Took several rests to slow breathing. Pt deconditioned. Convinced pt to get to recliner after walk by changing recliners and adding many pillows. Seemed to be happier. Encouraged pt to walk more. [phone]  Elissa Lovett Svensen CES, ACSM 07/11/2013 10:28 AM

## 2013-07-11 NOTE — Discharge Summary (Signed)
Physician Discharge Summary  Patient ID: Tyler [name] MRN: [id number] DOB/AGE: 12-24-1928 79 y.o.  Admit date: 07/08/2013 Discharge date: 07/11/2013  Admission Diagnoses:  Patient Active Problem List   Diagnosis Date Noted  . Cellulitis 07/08/2013  . S/P CABG x 4 06/27/2013  . Exertional 06/24/2013    Class: Acute  . Coronary atherosclerosis   . Hypertension   . Hyperlipidemia   . CVA (cerebral infarction)    Discharge Diagnoses:   Patient Active Problem List   Diagnosis Date Noted  . Cellulitis 07/08/2013  . S/P CABG x 4 06/27/2013  . Exertional 06/24/2013    Class: Acute  . Coronary atherosclerosis   . Hypertension   . Hyperlipidemia   . CVA (cerebral infarction)    Discharged Condition: good  History of Present Illness:   [name] is a 79 yo white male well known to [facility].  He is S/P CABG x 4 performed 06/27/2013.  The patient did well post operatively, however he continued to complain of RLE pain and swelling.  Initially this appeared to be venous stasis changes.  The patient was discharged home and instructed to follow up with [facility] in 1 week to assess his wound.  He presented for wound check on 07/08/2013 at which time the patient stated he felt his RLE was getting worse.  He states it had become more red, the swelling had increased, and it was more painful to the touch.  The patient's wife accompanied him to the appointment and was very upset the patient had been discharged home and states she is unable to care for him and wishes for SNF placement to be arranged.  After evaluation in the office there was concern for possible cellulitis and DVT.  It was felt the patient would benefit from admission to the hospital for IV ABX and lower extremity duplex to assess for DVT.   Hospital Course:   Upon arrival to the hospital the patient was started on Vancomycin and Zosyn.  He has been afebrile throughout the hospitalization and no Leukocytosis is present.  Lower extremity  venous duplex was performed and was negative for the presence of DVT.  However, it did reveal evidence of fluid collections along the Endoscopic saphenous vein harvest tract.  The patient was diuresed with Lasix with improvement of RLE swelling.  Once swelling decreased there was evidence of seroma collection in the patient's RLE.  This was aspirated and 5 cc of dark old blood was removed.  The patient's leg is no longer erythematous and is not as painful to the touch.  His venous stasis changes remain unchanged.  He has completed a 5 day course of IV Vanc and Zosyn.  He will be transitioned to an oral regimen of Doxycycline 100 mg daily and Augmentin 500 mg BID for 10 more days of therapy.  SNF therapy has been arranged.  The patient is medically stable at this time and we will plan to discharge to [facility] place in the morning of 07/12/2013.  He will follow up with Dr. Donata Clay on 12/31 at 1:00 with CXR prior to his appointment.  Disposition: SNF   Future Appointments Provider Department Dept Phone   07/16/2013 9:30 AM Lesleigh Noe, MD [facility] [phone]   07/24/2013 1:00 PM Kerin Perna, MD [facility] [phone]     Discharge Medications:     Medication List    STOP taking these medications  oxyCODONE 5 MG immediate release tablet  Commonly known as:  Oxy IR/ROXICODONE      TAKE these medications       amoxicillin-clavulanate 875-125 MG per tablet  Commonly known as:  AUGMENTIN  Take 1 tablet by mouth 2 (two) times daily. For 10 Days  Start taking on:  07/13/2013     aspirin 81 MG tablet  Take 81 mg by mouth daily.     clopidogrel 75 MG tablet  Commonly known as:  PLAVIX  Take 75 mg by mouth daily with breakfast.     doxycycline 100 MG tablet  Commonly known as:  VIBRA-TABS  Take 1 tablet (100 mg total) by mouth daily. For 10 days  Start taking on:  07/13/2013     ferrous fumarate-b12-vitamic  C-folic acid capsule  Commonly known as:  TRINSICON / FOLTRIN  Take 1 capsule by mouth 2 (two) times daily after a meal.     FISH OIL PO  Take 1,000 Units by mouth daily.     folic acid 800 MCG tablet  Commonly known as:  FOLVITE  Take 800 mcg by mouth daily.     furosemide 40 MG tablet  Commonly known as:  LASIX  Take 1 tablet (40 mg total) by mouth daily. For 14 days     lisinopril 5 MG tablet  Commonly known as:  PRINIVIL,ZESTRIL  Take 1 tablet (5 mg total) by mouth daily.     metoprolol tartrate 25 MG tablet  Commonly known as:  LOPRESSOR  Take 25 mg by mouth 2 (two) times daily.     multivitamin tablet  Take 1 tablet by mouth daily.     potassium chloride SA 20 MEQ tablet  Commonly known as:  K-DUR,KLOR-CON  Take 1 tablet (20 mEq total) by mouth daily. For 14 days     pravastatin 40 MG tablet  Commonly known as:  PRAVACHOL  Take 40 mg by mouth at bedtime.     terazosin 5 MG capsule  Commonly known as:  HYTRIN  Take 5 mg by mouth at bedtime.     traMADol 50 MG tablet  Commonly known as:  ULTRAM  Take 1 tablet (50 mg total) by mouth every 6 (six) hours as needed for moderate pain.     Vitamin D-3 1000 UNITS Caps  Take 1 capsule by mouth daily.          SignedLowella Dandy 07/11/2013, 8:22 AM

## 2013-07-11 NOTE — Progress Notes (Addendum)
      [address]       Tyler [name] [address]             [phone]      Subjective:  Tyler [name] states he is doing better.  He continues to have pain in his RLE which he now describes as an ache.  He refused ambulation yesterday. + BM  Objective: Vital signs in last 24 hours: Temp:  [98 F (36.7 C)-98.4 F (36.9 C)] 98.2 F (36.8 C) (12/18 0409) Pulse Rate:  [74-83] 80 (12/18 0409) Cardiac Rhythm:  [-] Normal sinus rhythm (12/17 2036) Resp:  [18-24] 20 (12/18 0409) BP: (86-110)/(45-62) 104/45 mmHg (12/18 0409) SpO2:  [93 %-95 %] 93 % (12/18 0409) Weight:  [210 lb 15.7 oz (95.7 kg)] 210 lb 15.7 oz (95.7 kg) (12/18 0409)  Intake/Output from previous day: 12/17 0701 - 12/18 0700 In: 840 [P.O.:840] Out: 1451 [Urine:1450; Stool:1]  General appearance: alert, cooperative and no distress Heart: regular rate and rhythm Lungs: clear to auscultation bilaterally Abdomen: soft, non-tender; bowel sounds normal; no masses,  no organomegaly Extremities: edema improvement of RLE edema, erythema resolved, + venous stasis changes, ?seroma collection Wound: clean and dry  Lab Results:  Recent Labs  07/08/13 1810  WBC 11.1*  HGB 8.3*  HCT 26.4*  PLT 324   BMET:  Recent Labs  07/08/13 1810  NA 136  K 4.9  CL 99  CO2 28  GLUCOSE 118*  BUN 23  CREATININE 1.23  CALCIUM 8.6    PT/INR: No results found for this basename: LABPROT, INR,  in the last 72 hours ABG    Component Value Date/Time   PHART 7.368 06/28/2013 0343   HCO3 25.1* 06/28/2013 0343   TCO2 22 06/28/2013 1703   ACIDBASEDEF 5.0* 06/28/2013 0139   O2SAT 100.0 06/28/2013 0343   CBG (last 3)  No results found for this basename: GLUCAP,  in the last 72 hours  Assessment/Plan:  1. Cellulitis- RLE, looks much better, swelling has improved, appears to be seroma collection present- on Vanc and Zosyn 2. CV- S/P CABG 12/4- continue ASA, Lopressor, Lisinopril, ASA, Statin 3. H/O Stroke- on Plavix 4.  GI- constipation resolved, continue prn bowel support 5. Deconditioning- patient has a bed at [facility] 6. Dispo- will attempt drainage of right leg seroma, will give IV ABX today, then will transition to PO Doxycycline and Augmentin, plan to d/c to SNF in AM if patient remains stable  LOS: 3 days    Procedure Note:   Right calf was prepped with betadine solution.  The area was anesthesized with 5 cc of 1% Lidocaine.  An 18 gauge needle was inserted into the suspected seroma.  5 cc of dark old blood was aspirated.  The needle was moved around various angles while in seroma and no more fluid was able to be evacuated.  The patient tolerated the procedure well.  The would was dressed with gauze and wrapped in an ACE wrap.   Tyler [name] 07/11/2013  patient examined and medical record reviewed,agree with above note.\] FL-2 signed  Tyler [name] 07/11/2013

## 2013-07-12 LAB — CULTURE, ROUTINE-ABSCESS: Culture: NO GROWTH

## 2013-07-12 MED ORDER — OXYCODONE HCL 5 MG PO TABS: 5.0000 mg | ORAL_TABLET | ORAL | Status: DC | PRN

## 2013-07-12 MED ORDER — DOXYCYCLINE HYCLATE 100 MG PO TABS
100.0000 mg | ORAL_TABLET | Freq: Every day | ORAL | Status: DC
  Administered 2013-07-12: 100 mg via ORAL
  Filled 2013-07-12: qty 1

## 2013-07-12 MED ORDER — DOXYCYCLINE HYCLATE 100 MG PO TABS: 100.0000 mg | ORAL_TABLET | Freq: Every day | ORAL | Status: DC

## 2013-07-12 MED ORDER — AMOXICILLIN-POT CLAVULANATE 875-125 MG PO TABS: 1.0000 | ORAL_TABLET | Freq: Two times a day (BID) | ORAL | Status: DC

## 2013-07-12 NOTE — Progress Notes (Signed)
Report called to Scarlette Calico, Charity fundraiser at [facility]. SBAR and kardex used to give report. Opportunity to ask questions was given. Pt is being transported by his wife to the facility.

## 2013-07-12 NOTE — Progress Notes (Signed)
Plans noted. Encouraged activity.

## 2013-07-12 NOTE — Progress Notes (Signed)
1315 Offered to walk with pt. Stated he is getting ready to go to SNF and wants to save energy. York Spaniel he will walk over there. Encouraged pt to work hard with rehab to get stronger. Luetta Nutting RN BSN 07/12/2013 1:15 PM

## 2013-07-12 NOTE — Progress Notes (Addendum)
      [address]       [place] [address]             [phone]      Subjective:  Mr. Klonowski states he is doing better.  He again does not want to ambulate much.  I stressed the importance of ambulation as well as participation with PT at the SNF.  + BM  Objective: Vital signs in last 24 hours: Temp:  [98 F (36.7 C)-98.8 F (37.1 C)] 98.6 F (37 C) (12/19 0616) Pulse Rate:  [88-94] 88 (12/19 0616) Cardiac Rhythm:  [-] Normal sinus rhythm (12/18 2030) Resp:  [18] 18 (12/19 0616) BP: (93-127)/(43-75) 112/62 mmHg (12/19 0616) SpO2:  [92 %-94 %] 92 % (12/19 0616) Weight:  [207 lb 10.8 oz (94.2 kg)] 207 lb 10.8 oz (94.2 kg) (12/19 0616)  Intake/Output from previous day: 12/18 0701 - 12/19 0700 In: 820 [P.O.:720; IV Piggyback:100] Out: 528 [Urine:525; Stool:3]  General appearance: alert, cooperative and no distress Heart: regular rate and rhythm Lungs: clear to auscultation bilaterally Abdomen: soft, non-tender; bowel sounds normal; no masses,  no organomegaly Extremities: edema stable, no erythema, + venous stasis changes Wound: clean and dry  Lab Results: No results found for this basename: WBC, HGB, HCT, PLT,  in the last 72 hours BMET: No results found for this basename: NA, K, CL, CO2, GLUCOSE, BUN, CREATININE, CALCIUM,  in the last 72 hours  PT/INR: No results found for this basename: LABPROT, INR,  in the last 72 hours ABG    Component Value Date/Time   PHART 7.368 06/28/2013 0343   HCO3 25.1* 06/28/2013 0343   TCO2 22 06/28/2013 1703   ACIDBASEDEF 5.0* 06/28/2013 0139   O2SAT 100.0 06/28/2013 0343   CBG (last 3)  No results found for this basename: GLUCAP,  in the last 72 hours  Assessment/Plan:  1. RLE Cellulitis-improving on IV Vanc, Zosyn- will get IV doses this morning, then transition to PO for a 10 day course 2. CV- S/P CABG x 4, maintaining NSR on Lopressor, Lisinopril, ASA, Statin 3. H/O Stroke remains on Plavix 4. ID- fluid culture  from RLE, remains negative 5. Deconditioning- SNF  6. Dispo- patient stable, will d/c to [facility] today   LOS: 4 days    Raford Pitcher, ERIN 07/12/2013  patient examined and medical record reviewed,agree with above note. VAN TRIGT III,Navina Wohlers 07/12/2013

## 2013-07-12 NOTE — Progress Notes (Addendum)
Clinical Social Work Department CLINICAL SOCIAL WORK PLACEMENT NOTE 07/12/2013  Patient:  Tyler [name], Tyler [name]  Account Number:  [id number] Admit date:  07/08/2013  Clinical Social Worker:  Carren Rang  Date/time:  07/09/2013 01:24 PM  Clinical Social Work is seeking post-discharge placement for this patient at the following level of care:   SKILLED NURSING   (*CSW will update this form in Epic as items are completed)   07/09/2013  Patient/family provided with [facility] Department of Clinical Social Work's list of facilities offering this level of care within the geographic area requested by the patient (or if unable, by the patient's family).  07/09/2013  Patient/family informed of their freedom to choose among providers that offer the needed level of care, that participate in Medicare, Medicaid or managed care program needed by the patient, have an available bed and are willing to accept the patient.  07/09/2013  Patient/family informed of [facility]' ownership interest in [facility], as well as of the fact that they are under no obligation to receive care at this facility.  PASARR submitted to EDS on 07/09/2013 PASARR number received from EDS on 07/09/2013  FL2 transmitted to all facilities in geographic area requested by pt/family on  07/09/2013 FL2 transmitted to all facilities within larger geographic area on   Patient informed that his/her managed care company has contracts with or will negotiate with  certain facilities, including the following:     Patient/family informed of bed offers received:  07/10/2013 Patient chooses bed at [facility] Physician recommends and patient chooses bed at    Patient to be transferred to [facility] on  07/12/2013 Patient to be transferred to facility by Wife  The following physician request were entered in Epic:   Additional Comments:  Maree Krabbe, MSW, [employer] [phone]

## 2013-07-12 NOTE — Progress Notes (Signed)
Clinical Social Worker facilitated patient discharge by contacting the patient, family and facility, [facility]. Patient agreeable to this plan and arranging transport via patient's family . CSW will sign off, as social work intervention is no longer needed.  Maree Krabbe, MSW, Theresia Majors [phone]

## 2013-07-16 ENCOUNTER — Encounter: Payer: Medicare Other | Admitting: Interventional Cardiology

## 2013-07-16 NOTE — Discharge Summary (Signed)
patient examined and medical record reviewed,agree with above note. VAN TRIGT III,PETER 07/16/2013

## 2013-07-23 ENCOUNTER — Other Ambulatory Visit: Payer: Self-pay | Admitting: *Deleted

## 2013-07-23 DIAGNOSIS — I2510 Atherosclerotic heart disease of native coronary artery without angina pectoris: Secondary | ICD-10-CM

## 2013-07-24 ENCOUNTER — Encounter: Payer: Self-pay | Admitting: Cardiothoracic Surgery

## 2013-07-24 ENCOUNTER — Ambulatory Visit (INDEPENDENT_AMBULATORY_CARE_PROVIDER_SITE_OTHER): Payer: Self-pay | Admitting: Cardiothoracic Surgery

## 2013-07-24 ENCOUNTER — Ambulatory Visit
Admission: RE | Admit: 2013-07-24 | Discharge: 2013-07-24 | Disposition: A | Discharge status: Home / Self Care | Payer: Medicare Other | Source: Ambulatory Visit | Attending: Cardiothoracic Surgery | Admitting: Cardiothoracic Surgery

## 2013-07-24 VITALS — BP 106/65 | HR 88 | Resp 20 | Ht 70.0 in | Wt 207.0 lb

## 2013-07-24 DIAGNOSIS — I2510 Atherosclerotic heart disease of native coronary artery without angina pectoris: Secondary | ICD-10-CM

## 2013-07-24 DIAGNOSIS — L0291 Cutaneous abscess, unspecified: Secondary | ICD-10-CM

## 2013-07-24 DIAGNOSIS — Z48812 Encounter for surgical aftercare following surgery on the circulatory system: Secondary | ICD-10-CM

## 2013-07-24 DIAGNOSIS — L0390 Cellulitis, unspecified: Secondary | ICD-10-CM

## 2013-07-24 DIAGNOSIS — Z951 Presence of aortocoronary bypass graft: Secondary | ICD-10-CM

## 2013-07-24 NOTE — Progress Notes (Signed)
PCP is Provider Not In System Referring Provider is Lesleigh Noe, MD  Chief Complaint  Patient presents with  . Routine Post Op    3 week f/u with CXR    HPI: 79 year old returns for office visit after CABG x4 one month ago. He required rehospitalization because of infected hematoma with cellulitis of the right leg endoscopic harvest site. He currently is at skilled [facility] facility[facility]. He has completed a course of oral antibiotics and is now ambulating. He is ready for discharge home with transition to home nursing and home physical therapy. He is not ready to drive a car.  His complaints now are mainly regarding urinary urgency and constipation. His leg tenderness and pain is significantly improved  He denies symptoms of angina or CHF. His chest x-ray today is clear. The sternal incision is well-healed. He is maintained sinus rhythm.   Past Medical History  Diagnosis Date  . Coronary atherosclerosis   . Osteoarthritis   . Hypertension   . Hyperlipidemia   . Erectile dysfunction   . CVA (cerebral infarction)   . Stroke   . GERD (gastroesophageal reflux disease)   . H/O hiatal hernia     Past Surgical History  Procedure Laterality Date  . Coronary artery bypass graft N/A 06/27/2013    Procedure: CORONARY ARTERY BYPASS GRAFTING (CABG);  Surgeon: Kerin Perna, MD;  Location: [facility] OR;  Service: Open Heart Surgery;  Laterality: N/A;  times four utilizing the left internal mammary artery and the right greater saphenous vein harvested endoscopically  . Tee without cardioversion N/A 06/27/2013    Procedure: TRANSESOPHAGEAL ECHOCARDIOGRAM (TEE);  Surgeon: Kerin Perna, MD;  Location: [facility] OR;  Service: Open Heart Surgery;  Laterality: N/A;  . Endovein harvest of greater saphenous vein Right 06/27/2013    Procedure: ENDOVEIN HARVEST OF GREATER SAPHENOUS VEIN;  Surgeon: Kerin Perna, MD;  Location: [facility] OR;  Service: Open Heart Surgery;  Laterality: Right;  . Inner ear  surgery Left 1982    Family History  Problem Relation Age of Onset  . Heart disease Sister   . Heart disease Brother     Social History History  Substance Use Topics  . Smoking status: Never Smoker   . Smokeless tobacco: Never Used  . Alcohol Use: Yes     Comment: Rare    Current Outpatient Prescriptions  Medication Sig Dispense Refill  . aspirin 81 MG tablet Take 81 mg by mouth daily.      . Cholecalciferol (VITAMIN D-3) 1000 UNITS CAPS Take 1 capsule by mouth daily.       . clopidogrel (PLAVIX) 75 MG tablet Take 75 mg by mouth daily with breakfast.      . ferrous fumarate-b12-vitamic C-folic acid (TRINSICON / FOLTRIN) capsule Take 1 capsule by mouth 2 (two) times daily after a meal.  60 capsule  0  . folic acid (FOLVITE) 800 MCG tablet Take 800 mcg by mouth daily.       . furosemide (LASIX) 40 MG tablet Take 1 tablet (40 mg total) by mouth daily. For 14 days  14 tablet  0  . lisinopril (PRINIVIL,ZESTRIL) 5 MG tablet Take 1 tablet (5 mg total) by mouth daily.  30 tablet  3  . metoprolol tartrate (LOPRESSOR) 25 MG tablet Take 25 mg by mouth 2 (two) times daily.      . Multiple Vitamin (MULTIVITAMIN) tablet Take 1 tablet by mouth daily.      . Omega-3 Fatty Acids (FISH  OIL PO) Take 1,000 Units by mouth daily.       [name] oxyCODONE (OXY IR/ROXICODONE) 5 MG immediate release tablet Take 1-2 tablets (5-10 mg total) by mouth every 4 (four) hours as needed for severe pain.  30 tablet  0  . potassium chloride SA (K-DUR,KLOR-CON) 20 MEQ tablet Take 1 tablet (20 mEq total) by mouth daily. For 14 days  7 tablet  0  . pravastatin (PRAVACHOL) 40 MG tablet Take 40 mg by mouth at bedtime.       [name] terazosin (HYTRIN) 5 MG capsule Take 5 mg by mouth at bedtime.      . traMADol (ULTRAM) 50 MG tablet Take 1 tablet (50 mg total) by mouth every 6 (six) hours as needed for moderate pain.  30 tablet  0   No current facility-administered medications for this visit.    No Known Allergies  Review of  Systems no fever, sleeping well, ready to go home  BP 106/65  Pulse 88  Resp 20  Ht 5\' 10"  (1.778 m)  Wt 207 lb (93.895 kg)  BMI 29.70 kg/m2  SpO2 96% Physical Exam Alert and comfortable Lungs clear Sternum well-healed Heart rate regular, no cardiac gallop or murmur Right leg with resolved cellulitis, incision is healed, severe dry skin reaction up to the knee Patient able to ambulate in the hallway without a walker  Diagnostic Tests:  Chest x-ray clear Impression: Improved condition ready to transition to home with home PT and nursing  Plan: Will follow back in office in 4 weeks to assess leg and overall condition and to assess starting hospital based cardiac rehabilitation, driving, increased  limit on lifting.  Will stop oral antibiotics, stop Lasix and potassium, stop iron

## 2013-08-06 ENCOUNTER — Telehealth: Payer: Self-pay | Admitting: Interventional Cardiology

## 2013-08-06 DIAGNOSIS — Z951 Presence of aortocoronary bypass graft: Secondary | ICD-10-CM

## 2013-08-06 NOTE — Telephone Encounter (Signed)
New message   Heart rate reading during session  99, after exercise 113 . No other complaint . Please advise on range the patient should be in.    Blood pressure  98/70 .  No chest pain .

## 2013-08-08 NOTE — Telephone Encounter (Signed)
mom for pt physical therapist.Beth [phone] Dr.Smith recommendations.Decrease the metoprolol to 12.5 mg BID. I'd like resting BP > 100 mmHg and Hr less than 120 bpm with exercise. called pt home, he ask that I call his wife on her cell to discuss.lmom for pt wife to call the office

## 2013-08-08 NOTE — Telephone Encounter (Signed)
Decrease the metoprolol to 12.5 mg BID. I'd like resting BP > 100 mmHg and Hr less than 120 bpm with exercise.

## 2013-08-09 NOTE — Telephone Encounter (Signed)
returned pt /and therapist call.they bothe received message left with Dr.Smit instructions.Decrease the metoprolol to 12.5 mg BID. I'd like resting BP > 100 mmHg and Hr less than 120 bpm with exercise.pt therapist request pt be started with cardiac rehab.adv her that I would fwd request to Dr.Smith.she verbalized understanding

## 2013-08-09 NOTE — Telephone Encounter (Signed)
Follow up ° ° ° ° ° ° ° ° ° °Pt returning nurses call °

## 2013-08-13 NOTE — Addendum Note (Signed)
Addended by: Lamar Laundry on: 08/13/2013 04:35 PM   Modules accepted: Orders

## 2013-08-13 NOTE — Telephone Encounter (Signed)
Okay to start rehab

## 2013-08-13 NOTE — Telephone Encounter (Signed)
returned pt call pt adv that Dr.Smith sts that it is ok to start cardiac rehab at [facility].pt rqst to start at [facility] rehab and then tfr to [facility] in [place].adv pt that I would have to call and see if cardiac rehab is offered there.pt verbalized understanding

## 2013-08-14 ENCOUNTER — Telehealth: Payer: Self-pay

## 2013-08-14 NOTE — Telephone Encounter (Signed)
 cardiac rehab given to [facility] records to fax [phone]

## 2013-08-21 ENCOUNTER — Ambulatory Visit (INDEPENDENT_AMBULATORY_CARE_PROVIDER_SITE_OTHER): Payer: Self-pay | Admitting: Cardiothoracic Surgery

## 2013-08-21 ENCOUNTER — Encounter: Payer: Self-pay | Admitting: Cardiothoracic Surgery

## 2013-08-21 VITALS — BP 91/58 | HR 88 | Resp 16 | Ht 70.0 in | Wt 207.0 lb

## 2013-08-21 DIAGNOSIS — I2510 Atherosclerotic heart disease of native coronary artery without angina pectoris: Secondary | ICD-10-CM

## 2013-08-21 DIAGNOSIS — Z951 Presence of aortocoronary bypass graft: Secondary | ICD-10-CM

## 2013-08-21 NOTE — Progress Notes (Signed)
PCP is Provider Not In System Referring Provider is Sinclair Grooms, MD  Chief Complaint  Patient presents with  . Routine Post Op    1 month f/u.Marland KitchenMarland KitchenDr. Tamala Julian has released to start cardiac rehab    HPI: Final visit after multivessel CABG early December 2014 ] Patient was readmitted for cellulitis-edema of right leg and the vein harvest site. He was then discharged to skilled nursing facility for further rehabilitation and is now at home. Patient is receiving home physical therapy and we'll start phase II cardiac rehabilitation at the hospital for feb 9. The patient denies angina, surgical incisions are well-healed and he has no residual edema. He is ready to drive. He is maintained sinus rhythm. Past Medical History  Diagnosis Date  . Coronary atherosclerosis   . Osteoarthritis   . Hypertension   . Hyperlipidemia   . Erectile dysfunction   . CVA (cerebral infarction)   . Stroke   . GERD (gastroesophageal reflux disease)   . H/O hiatal hernia     Past Surgical History  Procedure Laterality Date  . Coronary artery bypass graft N/A 06/27/2013    Procedure: CORONARY ARTERY BYPASS GRAFTING (CABG);  Surgeon: Ivin Poot, MD;  Location: [place];  Service: Open Heart Surgery;  Laterality: N/A;  times four utilizing the left internal mammary artery and the right greater saphenous vein harvested endoscopically  . Tee without cardioversion N/A 06/27/2013    Procedure: TRANSESOPHAGEAL ECHOCARDIOGRAM (TEE);  Surgeon: Ivin Poot, MD;  Location: [place];  Service: Open Heart Surgery;  Laterality: N/A;  . Endovein harvest of greater saphenous vein Right 06/27/2013    Procedure: ENDOVEIN HARVEST OF GREATER SAPHENOUS VEIN;  Surgeon: Ivin Poot, MD;  Location: [place];  Service: Open Heart Surgery;  Laterality: Right;  . Inner ear surgery Left 1982    Family History  Problem Relation Age of Onset  . Heart disease Sister   . Heart disease Brother     Social History History  Substance  Use Topics  . Smoking status: Never Smoker   . Smokeless tobacco: Never Used  . Alcohol Use: Yes     Comment: Rare    Current Outpatient Prescriptions  Medication Sig Dispense Refill  . aspirin 81 MG tablet Take 81 mg by mouth daily.      [name] atorvastatin (LIPITOR) 10 MG tablet Take 10 mg by mouth daily.      . Cholecalciferol (VITAMIN D-3) 1000 UNITS CAPS Take 1 capsule by mouth daily.       . clopidogrel (PLAVIX) 75 MG tablet Take 75 mg by mouth daily with breakfast.      . folic acid (FOLVITE) 259 MCG tablet Take 800 mcg by mouth daily.       [name] lisinopril (PRINIVIL,ZESTRIL) 5 MG tablet Take 1 tablet (5 mg total) by mouth daily.  30 tablet  3  . metoprolol tartrate (LOPRESSOR) 25 MG tablet Take 12.5 mg by mouth 2 (two) times daily.       . Multiple Vitamin (MULTIVITAMIN) tablet Take 1 tablet by mouth daily.      . Omega-3 Fatty Acids (FISH OIL PO) Take 1,000 Units by mouth daily.       [name] oxyCODONE (OXY IR/ROXICODONE) 5 MG immediate release tablet Take 1-2 tablets (5-10 mg total) by mouth every 4 (four) hours as needed for severe pain.  30 tablet  0  . terazosin (HYTRIN) 5 MG capsule Take 5 mg by mouth at bedtime.      [name]  traMADol (ULTRAM) 50 MG tablet Take 1 tablet (50 mg total) by mouth every 6 (six) hours as needed for moderate pain.  30 tablet  0   No current facility-administered medications for this visit.    No Known Allergies  Review of Systems improved strength and appetite, no fever  BP 91/58  Pulse 88  Resp 16  Ht 5\' 10"  (1.778 m)  Wt 207 lb (93.895 kg)  BMI 29.70 kg/m2  SpO2 97% Physical Exam Alert -  Comfortable Lungs clear Heart rate regular without murmur Lower extremities without pitting edema Neuro intact   Diagnostic Tests: Chest x-ray today  Impression: Doing well now 6 weeks postoperatively He'll continue low dose beta blocker, low-dose Lipitor, aspirin, and lisinopril. Return as needed

## 2013-08-27 ENCOUNTER — Ambulatory Visit: Payer: Medicare Other | Admitting: Physician Assistant

## 2013-08-27 ENCOUNTER — Ambulatory Visit (INDEPENDENT_AMBULATORY_CARE_PROVIDER_SITE_OTHER): Payer: Medicare Other | Admitting: Physician Assistant

## 2013-08-27 ENCOUNTER — Encounter: Payer: Self-pay | Admitting: Physician Assistant

## 2013-08-27 VITALS — BP 119/70 | HR 78 | Ht 70.0 in | Wt 204.0 lb

## 2013-08-27 DIAGNOSIS — I10 Essential (primary) hypertension: Secondary | ICD-10-CM

## 2013-08-27 DIAGNOSIS — I6350 Cerebral infarction due to unspecified occlusion or stenosis of unspecified cerebral artery: Secondary | ICD-10-CM

## 2013-08-27 DIAGNOSIS — E785 Hyperlipidemia, unspecified: Secondary | ICD-10-CM

## 2013-08-27 DIAGNOSIS — I2510 Atherosclerotic heart disease of native coronary artery without angina pectoris: Secondary | ICD-10-CM

## 2013-08-27 DIAGNOSIS — I639 Cerebral infarction, unspecified: Secondary | ICD-10-CM

## 2013-08-27 MED ORDER — LISINOPRIL 5 MG PO TABS: 5.0000 mg | ORAL_TABLET | Freq: Every day | ORAL | Status: DC

## 2013-08-27 MED ORDER — ATORVASTATIN CALCIUM 10 MG PO TABS: 10.0000 mg | ORAL_TABLET | Freq: Every day | ORAL | Status: DC

## 2013-08-27 MED ORDER — METOPROLOL TARTRATE 25 MG PO TABS: 12.5000 mg | ORAL_TABLET | Freq: Two times a day (BID) | ORAL | Status: DC

## 2013-08-27 MED ORDER — TERAZOSIN HCL 5 MG PO CAPS: 5.0000 mg | ORAL_CAPSULE | Freq: Every day | ORAL | Status: DC

## 2013-08-27 MED ORDER — CLOPIDOGREL BISULFATE 75 MG PO TABS: 75.0000 mg | ORAL_TABLET | Freq: Every day | ORAL | Status: DC

## 2013-08-27 MED ORDER — NITROGLYCERIN 0.4 MG SL SUBL: 0.4000 mg | SUBLINGUAL_TABLET | SUBLINGUAL | Status: DC | PRN

## 2013-08-27 NOTE — Patient Instructions (Addendum)
LAB WORK TODAY;  BMET  YOU HAVE BEEN GIVEN RX'S FOR PLAVIX, LISINOPRIL, NTG, HYRTIN, LIPITOR, METOPROLOL  REFILLS FOR LISINOPRIL AND LIPITOR HAVE BEEN SENT INTO [facility] ON [address]  Your physician recommends that you schedule a follow-up appointment in: 2 MONTHS WITH DR. Tamala Julian  YOU WILL NEED FASTING LIPID AND LIVER PANEL TO BE DONE ON ONE OF THE DAYS YOU HAVE CARDIAC REHAB

## 2013-08-27 NOTE — Progress Notes (Signed)
[place], [address] [place], [place]  [address] Phone: [phone] Fax:  [phone]  Date:  08/27/2013   ID:  Tyler [name], DOB [birth year], MRN [id number]  PCP:  Provider Not In System  Cardiologist:  Dr. Daneen Schick    History of Present Illness: Tyler [name] is a 80 y.o. male with a hx of CAD, HTN, HL, prior stroke who was recently evaluated by Dr. Daneen Schick for exertional chest pain.  LHC on 06/27/13 demonstrated 3v CAD with critical LM stenosis.  EF was 50%.  He developed V. Fib arrest and was resuscitated.  He was referred for emergent CABG.  He underwent CABG with Dr. Prescott Gum (LIMA-LAD, SVG-PDA/PL, SVG-OM). He remained in NSR.  He was readmitted 12/15-12/18 with RLE seroma and cellulitis. His seroma was aspirated. He was treated with antibiotics and discharged to SNF.  He is now back home.  He is doing well.  No chest pain.  No significant dyspnea.  No orthopnea, PND.  He has some RLE edema (SVG harvest site).  No syncope.  No fevers or cough.  He starts cardiac rehab this week.    Recent Labs: 07/02/2013: Pro B Natriuretic peptide (BNP) 1458.0*  07/08/2013: Creatinine 1.23; Hemoglobin 8.3*; Potassium 4.9   Wt Readings from Last 3 Encounters:  08/21/13 207 lb (93.895 kg)  07/24/13 207 lb (93.895 kg)  07/12/13 207 lb 10.8 oz (94.2 kg)     Past Medical History  Diagnosis Date  . Coronary atherosclerosis   . Osteoarthritis   . Hypertension   . Hyperlipidemia   . Erectile dysfunction   . CVA (cerebral infarction)   . Stroke   . GERD (gastroesophageal reflux disease)   . H/O hiatal hernia     Current Outpatient Prescriptions  Medication Sig Dispense Refill  . aspirin 81 MG tablet Take 81 mg by mouth daily.      [name] atorvastatin (LIPITOR) 10 MG tablet Take 10 mg by mouth daily.      . Cholecalciferol (VITAMIN D-3) 1000 UNITS CAPS Take 1 capsule by mouth daily.       . clopidogrel (PLAVIX) 75 MG tablet Take 75 mg by mouth daily with breakfast.      . folic acid  (FOLVITE) 676 MCG tablet Take 800 mcg by mouth daily.       [name] lisinopril (PRINIVIL,ZESTRIL) 5 MG tablet Take 1 tablet (5 mg total) by mouth daily.  30 tablet  3  . metoprolol tartrate (LOPRESSOR) 25 MG tablet Take 12.5 mg by mouth 2 (two) times daily.       . Multiple Vitamin (MULTIVITAMIN) tablet Take 1 tablet by mouth daily.      . Omega-3 Fatty Acids (FISH OIL PO) Take 1,000 Units by mouth daily.       [name] oxyCODONE (OXY IR/ROXICODONE) 5 MG immediate release tablet Take 1-2 tablets (5-10 mg total) by mouth every 4 (four) hours as needed for severe pain.  30 tablet  0  . terazosin (HYTRIN) 5 MG capsule Take 5 mg by mouth at bedtime.      . traMADol (ULTRAM) 50 MG tablet Take 1 tablet (50 mg total) by mouth every 6 (six) hours as needed for moderate pain.  30 tablet  0   No current facility-administered medications for this visit.    Allergies:   Review of patient's allergies indicates no known allergies.   Social History:  The patient  reports that he has never smoked. He has never used smokeless tobacco.  He reports that he drinks alcohol. He reports that he does not use illicit drugs.   Family History:  The patient's family history includes Heart disease in his brother and sister.   ROS:  Please see the history of present illness.      All other systems reviewed and negative.   PHYSICAL EXAM: VS:  BP 119/70  Pulse 78  Ht 5\' 10"  (1.778 m)  Wt 204 lb (92.534 kg)  BMI 29.27 kg/m2 Well nourished, well developed, in no acute distress HEENT: normal Neck: no JVD at 90 degrees Cardiac:  normal S1, S2; RRR; no murmur Lungs:  clear to auscultation bilaterally, no wheezing, rhonchi or rales Abd: soft, nontender, no hepatomegaly Ext: trace - 1+ RLE edema Skin: warm and dry Neuro:  CNs 2-12 intact, no focal abnormalities noted  EKG:  NSR, HR 80, NSSTTW changs     ASSESSMENT AND PLAN:  1. CAD, s/p CABG:  Doing well.  Continue ASA, Plavix, statin, beta blocker, ACEI.  He starts cardiac  rehab soon. 2. Hypertension:  Controlled.  Check f/u BMET. 3. Hyperlipidemia:  Continue statin.  Arrange fasting Lipids and LFTs. 4. Prior Stroke:  Continue ASA and Plavix.  5. Disposition:  F/u with Dr. Daneen Schick 1-2 mos.   Signed, Richardson Dopp, PA-C  08/27/2013 2:41 PM

## 2013-08-28 ENCOUNTER — Telehealth: Payer: Self-pay | Admitting: Physician Assistant

## 2013-08-28 DIAGNOSIS — I10 Essential (primary) hypertension: Secondary | ICD-10-CM

## 2013-08-28 DIAGNOSIS — I2510 Atherosclerotic heart disease of native coronary artery without angina pectoris: Secondary | ICD-10-CM

## 2013-08-28 LAB — BASIC METABOLIC PANEL
BUN: 18 mg/dL (ref 6–23)
CHLORIDE: 104 meq/L (ref 96–112)
CO2: 27 mEq/L (ref 19–32)
Calcium: 8.8 mg/dL (ref 8.4–10.5)
Creatinine, Ser: 1.1 mg/dL (ref 0.4–1.5)
GFR: 65.98 mL/min (ref >60.00)
Glucose, Bld: 93 mg/dL (ref 70–99)
POTASSIUM: 4.2 meq/L (ref 3.5–5.1)
SODIUM: 139 meq/L (ref 135–145)

## 2013-08-28 MED ORDER — LISINOPRIL 5 MG PO TABS: 5.0000 mg | ORAL_TABLET | Freq: Every day | ORAL | Status: DC

## 2013-08-28 MED ORDER — TERAZOSIN HCL 5 MG PO CAPS: 5.0000 mg | ORAL_CAPSULE | Freq: Every day | ORAL | Status: DC

## 2013-08-28 MED ORDER — ATORVASTATIN CALCIUM 10 MG PO TABS: 10.0000 mg | ORAL_TABLET | Freq: Every day | ORAL | Status: DC

## 2013-08-28 MED ORDER — NITROGLYCERIN 0.4 MG SL SUBL: 0.4000 mg | SUBLINGUAL_TABLET | SUBLINGUAL | Status: DC | PRN

## 2013-08-28 MED ORDER — METOPROLOL TARTRATE 25 MG PO TABS: 12.5000 mg | ORAL_TABLET | Freq: Two times a day (BID) | ORAL | Status: DC

## 2013-08-28 MED ORDER — CLOPIDOGREL BISULFATE 75 MG PO TABS: 75.0000 mg | ORAL_TABLET | Freq: Every day | ORAL | Status: DC

## 2013-08-28 NOTE — Telephone Encounter (Signed)
New Message  Pt called states that he was given scripts to take back to the [place] [facility].. Pt states that they are requesting records inorder for the pt to recieve medications. Please assist   Fax # for [place] [phone]

## 2013-08-28 NOTE — Telephone Encounter (Signed)
OV note was copied and faxed to number that was provided.

## 2013-08-28 NOTE — Telephone Encounter (Signed)
Pt states that [facility] reprinted and [facility]ed. Pt aware.

## 2013-08-29 ENCOUNTER — Other Ambulatory Visit: Payer: Self-pay

## 2013-08-29 ENCOUNTER — Telehealth: Payer: Self-pay

## 2013-08-29 ENCOUNTER — Other Ambulatory Visit (INDEPENDENT_AMBULATORY_CARE_PROVIDER_SITE_OTHER): Payer: Medicare Other

## 2013-08-29 ENCOUNTER — Telehealth: Payer: Self-pay | Admitting: *Deleted

## 2013-08-29 ENCOUNTER — Encounter (HOSPITAL_COMMUNITY)
Admission: RE | Admit: 2013-08-29 | Discharge: 2013-08-29 | Disposition: A | Discharge status: Home / Self Care | Payer: Medicare Other | Source: Ambulatory Visit | Attending: Interventional Cardiology | Admitting: Interventional Cardiology

## 2013-08-29 DIAGNOSIS — Z5189 Encounter for other specified aftercare: Secondary | ICD-10-CM | POA: Insufficient documentation

## 2013-08-29 DIAGNOSIS — Z8674 Personal history of sudden cardiac arrest: Secondary | ICD-10-CM | POA: Insufficient documentation

## 2013-08-29 DIAGNOSIS — I10 Essential (primary) hypertension: Secondary | ICD-10-CM

## 2013-08-29 DIAGNOSIS — I2510 Atherosclerotic heart disease of native coronary artery without angina pectoris: Secondary | ICD-10-CM

## 2013-08-29 DIAGNOSIS — I509 Heart failure, unspecified: Secondary | ICD-10-CM | POA: Insufficient documentation

## 2013-08-29 DIAGNOSIS — IMO0001 Reserved for inherently not codable concepts without codable children: Secondary | ICD-10-CM | POA: Insufficient documentation

## 2013-08-29 DIAGNOSIS — Z951 Presence of aortocoronary bypass graft: Secondary | ICD-10-CM | POA: Insufficient documentation

## 2013-08-29 DIAGNOSIS — I5032 Chronic diastolic (congestive) heart failure: Secondary | ICD-10-CM | POA: Insufficient documentation

## 2013-08-29 LAB — LIPID PANEL
CHOLESTEROL: 137 mg/dL (ref 0–200)
HDL: 44.3 mg/dL (ref >39.00)
LDL CALC: 77 mg/dL (ref 0–99)
Total CHOL/HDL Ratio: 3
Triglycerides: 80 mg/dL (ref 0.0–149.0)
VLDL: 16 mg/dL (ref 0.0–40.0)

## 2013-08-29 LAB — HEPATIC FUNCTION PANEL
ALK PHOS: 50 U/L (ref 39–117)
ALT: 12 U/L (ref 0–53)
AST: 17 U/L (ref 0–37)
Albumin: 3.5 g/dL (ref 3.5–5.2)
BILIRUBIN TOTAL: 0.6 mg/dL (ref 0.3–1.2)
Bilirubin, Direct: 0.1 mg/dL (ref 0.0–0.3)
Total Protein: 6.6 g/dL (ref 6.0–8.3)

## 2013-08-29 MED ORDER — CLOPIDOGREL BISULFATE 75 MG PO TABS: 75.0000 mg | ORAL_TABLET | Freq: Every day | ORAL | Status: DC

## 2013-08-29 MED ORDER — METOPROLOL TARTRATE 25 MG PO TABS: 12.5000 mg | ORAL_TABLET | Freq: Two times a day (BID) | ORAL | Status: DC

## 2013-08-29 MED ORDER — TERAZOSIN HCL 5 MG PO CAPS: 5.0000 mg | ORAL_CAPSULE | Freq: Every day | ORAL | Status: DC

## 2013-08-29 MED ORDER — NITROGLYCERIN 0.4 MG SL SUBL: 0.4000 mg | SUBLINGUAL_TABLET | SUBLINGUAL | Status: DC | PRN

## 2013-08-29 MED ORDER — ATORVASTATIN CALCIUM 10 MG PO TABS: 10.0000 mg | ORAL_TABLET | Freq: Every day | ORAL | Status: DC

## 2013-08-29 MED ORDER — LISINOPRIL 5 MG PO TABS: 5.0000 mg | ORAL_TABLET | Freq: Every day | ORAL | Status: DC

## 2013-08-29 NOTE — Telephone Encounter (Signed)
pt notified about lab results with verbal understanding  

## 2013-08-29 NOTE — Telephone Encounter (Signed)
pt called and request all cardiac rx and last o/v note be faxed to the [place] fax [phone].done cpoies left at front desk for pt to pick.pt aware and verbalized understanding.

## 2013-08-29 NOTE — Progress Notes (Signed)
Cardiac Rehab Medication Review by a Pharmacist   Does the patient  feel that his/her medications are working for him/her?  yes  Has the patient been experiencing any side effects to the medications prescribed?  no  Does the patient measure his/her own blood pressure or blood glucose at home?  yes   Does the patient have any problems obtaining medications due to transportation or finances?   no  Understanding of regimen: good Understanding of indications: good Potential of compliance: good  Pharmacist comments: Patient has a good understanding of his medications. His wife was with him today. His dose of metoprolol was recently decreased after he was experiencing dizziness.   Tyler [name], PharmD Clinical Pharmacist - Resident Pager: [phone] Pharmacy: [phone] 08/29/2013 9:40 AM

## 2013-09-02 ENCOUNTER — Encounter (HOSPITAL_COMMUNITY)
Admission: RE | Admit: 2013-09-02 | Discharge: 2013-09-02 | Disposition: A | Discharge status: Home / Self Care | Payer: Medicare Other | Source: Ambulatory Visit | Attending: Interventional Cardiology | Admitting: Interventional Cardiology

## 2013-09-02 NOTE — Progress Notes (Signed)
Pt started cardiac rehab today.  Pt tolerated light exercise without difficulty. Telemetry rhythm Sinus with arrythmia. Late arrival. Tyler [name] has a slow shuffling gait. Patient refuses to use a walker. Patient advised to walk slowly and to take  frequent rest breaks on the walking track as needed. Vital signs stable. Will continue to monitor the patient throughout  the program.

## 2013-09-04 ENCOUNTER — Encounter (HOSPITAL_COMMUNITY): Payer: Self-pay | Admitting: Emergency Medicine

## 2013-09-04 ENCOUNTER — Emergency Department (HOSPITAL_COMMUNITY)
Admission: EM | Admit: 2013-09-04 | Discharge: 2013-09-04 | Disposition: A | Discharge status: Home / Self Care | Payer: Medicare Other | Attending: Emergency Medicine | Admitting: Emergency Medicine

## 2013-09-04 ENCOUNTER — Other Ambulatory Visit: Payer: Self-pay

## 2013-09-04 ENCOUNTER — Emergency Department (HOSPITAL_COMMUNITY): Payer: Medicare Other

## 2013-09-04 ENCOUNTER — Encounter (HOSPITAL_COMMUNITY)
Admission: RE | Admit: 2013-09-04 | Discharge: 2013-09-04 | Disposition: A | Discharge status: Home / Self Care | Payer: Medicare Other | Source: Ambulatory Visit | Attending: Interventional Cardiology | Admitting: Interventional Cardiology

## 2013-09-04 ENCOUNTER — Telehealth: Payer: Self-pay | Admitting: Cardiology

## 2013-09-04 DIAGNOSIS — Z8673 Personal history of transient ischemic attack (TIA), and cerebral infarction without residual deficits: Secondary | ICD-10-CM | POA: Insufficient documentation

## 2013-09-04 DIAGNOSIS — K219 Gastro-esophageal reflux disease without esophagitis: Secondary | ICD-10-CM | POA: Insufficient documentation

## 2013-09-04 DIAGNOSIS — Z87448 Personal history of other diseases of urinary system: Secondary | ICD-10-CM | POA: Insufficient documentation

## 2013-09-04 DIAGNOSIS — I951 Orthostatic hypotension: Secondary | ICD-10-CM | POA: Insufficient documentation

## 2013-09-04 DIAGNOSIS — I959 Hypotension, unspecified: Secondary | ICD-10-CM

## 2013-09-04 DIAGNOSIS — E785 Hyperlipidemia, unspecified: Secondary | ICD-10-CM | POA: Insufficient documentation

## 2013-09-04 DIAGNOSIS — R42 Dizziness and giddiness: Secondary | ICD-10-CM | POA: Insufficient documentation

## 2013-09-04 DIAGNOSIS — I10 Essential (primary) hypertension: Secondary | ICD-10-CM | POA: Insufficient documentation

## 2013-09-04 DIAGNOSIS — T887XXA Unspecified adverse effect of drug or medicament, initial encounter: Secondary | ICD-10-CM

## 2013-09-04 DIAGNOSIS — T50995A Adverse effect of other drugs, medicaments and biological substances, initial encounter: Secondary | ICD-10-CM | POA: Insufficient documentation

## 2013-09-04 DIAGNOSIS — I2510 Atherosclerotic heart disease of native coronary artery without angina pectoris: Secondary | ICD-10-CM | POA: Insufficient documentation

## 2013-09-04 DIAGNOSIS — Z7982 Long term (current) use of aspirin: Secondary | ICD-10-CM | POA: Insufficient documentation

## 2013-09-04 DIAGNOSIS — Z79899 Other long term (current) drug therapy: Secondary | ICD-10-CM | POA: Insufficient documentation

## 2013-09-04 DIAGNOSIS — Z7902 Long term (current) use of antithrombotics/antiplatelets: Secondary | ICD-10-CM | POA: Insufficient documentation

## 2013-09-04 DIAGNOSIS — Z951 Presence of aortocoronary bypass graft: Secondary | ICD-10-CM | POA: Insufficient documentation

## 2013-09-04 DIAGNOSIS — R55 Syncope and collapse: Secondary | ICD-10-CM | POA: Insufficient documentation

## 2013-09-04 DIAGNOSIS — M1990 Unspecified osteoarthritis, unspecified site: Secondary | ICD-10-CM | POA: Insufficient documentation

## 2013-09-04 LAB — BASIC METABOLIC PANEL
BUN: 17 mg/dL (ref 6–23)
CALCIUM: 8.9 mg/dL (ref 8.4–10.5)
CO2: 25 meq/L (ref 19–32)
Chloride: 103 mEq/L (ref 96–112)
Creatinine, Ser: 1.15 mg/dL (ref 0.50–1.35)
GFR calc Af Amer: 66 mL/min — ABNORMAL LOW (ref >90)
GFR calc non Af Amer: 57 mL/min — ABNORMAL LOW (ref >90)
GLUCOSE: 94 mg/dL (ref 70–99)
POTASSIUM: 4.5 meq/L (ref 3.7–5.3)
Sodium: 141 mEq/L (ref 137–147)

## 2013-09-04 LAB — URINALYSIS, ROUTINE W REFLEX MICROSCOPIC
BILIRUBIN URINE: NEGATIVE
Glucose, UA: NEGATIVE mg/dL
HGB URINE DIPSTICK: NEGATIVE
Ketones, ur: NEGATIVE mg/dL
Leukocytes, UA: NEGATIVE
Nitrite: NEGATIVE
PH: 6.5 (ref 5.0–8.0)
Protein, ur: NEGATIVE mg/dL
SPECIFIC GRAVITY, URINE: 1.011 (ref 1.005–1.030)
Urobilinogen, UA: 1 mg/dL (ref 0.0–1.0)

## 2013-09-04 LAB — CBC WITH DIFFERENTIAL/PLATELET
Basophils Absolute: 0.1 10*3/uL (ref 0.0–0.1)
Basophils Relative: 1 % (ref 0–1)
EOS PCT: 3 % (ref 0–5)
Eosinophils Absolute: 0.2 10*3/uL (ref 0.0–0.7)
HCT: 33.9 % — ABNORMAL LOW (ref 39.0–52.0)
Hemoglobin: 11 g/dL — ABNORMAL LOW (ref 13.0–17.0)
Lymphocytes Relative: 20 % (ref 12–46)
Lymphs Abs: 1.6 10*3/uL (ref 0.7–4.0)
MCH: 28.7 pg (ref 26.0–34.0)
MCHC: 32.4 g/dL (ref 30.0–36.0)
MCV: 88.5 fL (ref 78.0–100.0)
MONO ABS: 0.6 10*3/uL (ref 0.1–1.0)
Monocytes Relative: 8 % (ref 3–12)
NEUTROS PCT: 68 % (ref 43–77)
Neutro Abs: 5.3 10*3/uL (ref 1.7–7.7)
Platelets: 178 10*3/uL (ref 150–400)
RBC: 3.83 MIL/uL — AB (ref 4.22–5.81)
RDW: 15.7 % — ABNORMAL HIGH (ref 11.5–15.5)
WBC: 7.8 10*3/uL (ref 4.0–10.5)

## 2013-09-04 LAB — TROPONIN I: Troponin I: <0.3 ng/mL (ref <0.30)

## 2013-09-04 MED ORDER — SODIUM CHLORIDE 0.9 % IV BOLUS (SEPSIS)
250.0000 mL | Freq: Once | INTRAVENOUS | Status: AC
  Administered 2013-09-04: 250 mL via INTRAVENOUS

## 2013-09-04 MED ORDER — SODIUM CHLORIDE 0.9 % IV SOLN
INTRAVENOUS | Status: DC
  Administered 2013-09-04: 13:00:00 via INTRAVENOUS

## 2013-09-04 NOTE — Telephone Encounter (Signed)
Called by Rehab, pt had BP down in the 70s and felt dizzy.  Due to age and such a low BP I have asked them to send to ER for evaluation by ER MD.

## 2013-09-04 NOTE — Discharge Instructions (Signed)
°Emergency Department Resource Guide °1) Find a Doctor and Pay Out of Pocket °Although you won't have to find out who is covered by your insurance plan, it is a good idea to ask around and get recommendations. You will then need to call the office and see if the doctor you have chosen will accept you as a new patient and what types of options they offer for patients who are self-pay. Some doctors offer discounts or will set up payment plans for their patients who do not have insurance, but you will need to ask so you aren't surprised when you get to your appointment. ° °2) Contact Your Local Health Department °Not all health departments have doctors that can see patients for sick visits, but many do, so it is worth a call to see if yours does. If you don't know where your local health department is, you can check in your phone book. The CDC also has a tool to help you locate your state's health department, and many state websites also have listings of all of their local health departments. ° °3) Find a Walk-in Clinic °If your illness is not likely to be very severe or complicated, you may want to try a walk in clinic. These are popping up all over the country in pharmacies, drugstores, and shopping centers. They're usually staffed by nurse practitioners or physician assistants that have been trained to treat common illnesses and complaints. They're usually fairly quick and inexpensive. However, if you have serious medical issues or chronic medical problems, these are probably not your best option. ° °No Primary Care Doctor: °- Call Health Connect at  832-8000 - they can help you locate a primary care doctor that  accepts your insurance, provides certain services, etc. °- Physician Referral Service- 1-800-533-3463 ° °Chronic Pain Problems: °Organization         Address  Phone   Notes  °[facility]  (336) 297-2271 Patients need to be referred by their primary care doctor.  ° °Medication  Assistance: °Organization         Address  Phone   Notes  °[place] [facility] [address] °[place], [place] [address] (336) 641-8030 --Must be a resident of [place] °-- Must have NO insurance coverage whatsoever (no Medicaid/ Medicare, etc.) °-- The pt. MUST have a primary care doctor that directs their care regularly and follows them in the community °  °[facility]  (866) 331-1348   °[employer]  (888) 892-1162   ° °Agencies that provide inexpensive medical care: °Organization         Address  Phone   Notes  °[facility]  (336) 832-8035   °[facility]    (336) 832-7272   °[facility] [address] °[place], [place] [address] (336) 832-4777   °[facility] [address], °[place] (336) 271-4999   °[facility]    (336) 373-0678   °[facility]    (336) 272-1050   °[facility] ° [address],  Phone:  (336) 832-4444, Fax:  (336) 832-4440 Hours of Operation:  9 am - 6 pm, M-F.  Also accepts Medicaid/Medicare and self-pay.  °[facility] ° [address],  Phone: (336) 832-3150, Fax: (336) 832-3151. Hours of Operation:  8:30 am - 5:30 pm, M-F.  Also accepts Medicaid and self-pay.  °[facility] [place] [address], [place] Phone: (336) 878-6027   °[facility] [address], [place], [place] (336)723-1848, Ext. 123 Mondays & Thursdays: 7-9 AM.  First 15 patients are seen on a first come, first serve basis. °  ° °Medicaid-accepting [place] Providers: ° °Organization         Address  Phone   Notes  °[facility] [address], [place] (336) 641-2100 Also accepts self-pay patients.  °[facility] [address], [place] ° (336) 856-9996   °[facility] [address], [place]  (336) 288-8857   °[facility] [address] [place] [address], [place] (336) 299-7000   °Veita Bland [address], [place]  ° (336) 373-1557 Only accepts [place] Access Medicaid patients after they have their name applied to their card.  ° °Self-Pay (no insurance) in [place]: ° °Organization         Address  Phone   Notes  °Sickle Cell Patients, [facility] [address], [place] (336) 832-1970   °[facility] [address], [place] (336) 832-4400   °[facility] ° [address], [place] (336) 992-4800   °[facility]/Dr. Osei-Bonsu ° [address] [place] [address], [place] or [address], [place] (336) 841-8500 Phone number for both [place] and [place] locations is the same.  °[facility] [address], [place] (336) 299-0000   °[facility] [address] [place] [address], [place] or [address] (336) 852-7530 °(336) 878-2260   °[facility] [address], [place] (336) 350-1642, phone; (336) 294-5005, fax Sees patients 1st and 3rd Saturday of every month.  Must not qualify for public or private insurance (i.e. Medicaid, Medicare, [place] Health Choice, Veterans' Benefits) • Household income should be no more than 200% of the poverty level •The clinic cannot treat you if you are pregnant or think you are pregnant • Sexually transmitted diseases are not treated at the clinic.  ° ° °Dental Care: °Organization         Address  Phone  Notes  °[place] [facility] [address], [place] (336) 641-6152 Accepts children up to age 21 who are enrolled in Medicaid or [place] Health Choice; pregnant women with a Medicaid card; and children who have applied for Medicaid or [place] Health Choice, but were declined, whose parents can pay a reduced fee at time of service.  °[place]  [facility] [place]  [address], [place] (336) 641-7733 Accepts children up to age 21 who are enrolled in Medicaid or [place] Health Choice; pregnant women with a Medicaid card; and children who have applied for Medicaid or  Health Choice, but were declined, whose parents can pay a reduced fee at time of service.  °[facility] ° [address], [place] (336) 641-4533 Patients are seen by appointment only. Walk-ins are not accepted. [facility] will see patients 18 years of age and older. °Monday - Tuesday (8am-5pm) °Most Wednesdays (8:30-5pm) °$30 per visit, cash only  °[facility] ° [address], [place] (336) 641-4533 Patients are seen by appointment only. Walk-ins are not accepted. [facility] will see patients 18 years of age and older. °One   Wednesday Evening (Monthly: Volunteer Based).  $30 per visit, cash only  °[facility]  (919) 537-3737 for adults; Children under age 4, call Graduate Pediatric Dentistry at (919) [phone]. Children aged 4-14, please call (919) 537-3737 to request a pediatric application. ° Dental services are provided in all areas of dental care including fillings, crowns and bridges, complete and partial dentures, implants, gum treatment, root canals, and extractions. Preventive care is also provided. Treatment is provided to both adults and children. °Patients are selected via a lottery and there is often a waiting list. °  °[facility] [address], °[place] ° (336) 763-8833 [email or web address] °  °[facility] [address], [place], [place] (336)723-1848, Ext. 123 Second and Fourth Thursday of each month, opens at 6:30 AM; Clinic ends at 9 AM.  Patients are seen on a first-come first-served basis, and a limited number are seen during each clinic.  ° °[facility] ° [address], [place], [place] (336) 723-7904    Eligibility Requirements °You must have lived in [place], [place], or [place] counties for at least the last three months. °  You cannot be eligible for state or federal sponsored healthcare insurance, including Veterans Administration, Medicaid, or Medicare. °  You generally cannot be eligible for healthcare insurance through your employer.  °  How to apply: °Eligibility screenings are held every Tuesday and Wednesday afternoon from 1:00 pm until 4:00 pm. You do not need an appointment for the interview!  °[facility] [address], [place], [place] 336-631-2330   °[facility]  336-342-8273   °[facility]  336-703-3100   °[facility]  336-570-6415   ° °Behavioral Health Resources in the Community: °Intensive Outpatient Programs °Organization         Address  Phone  Notes  °[facility] [address], [place], [place] 336-878-6098   °[facility] [address], [place], [place] 336-832-9800   °[facility] [address], [place], [place] ° 336-882-2125   °[facility] [address],  °[place], [place] 1-800-853-5163 or 336-641-4981   °Substance Abuse Resources °Organization         Address  Phone  Notes  °[facility]  336-882-2125   °[facility]  336-784-9470   °[facility]  336-285-9073   °[facility]  336-845-3988   °Residential & Outpatient Substance Abuse Program  1-800-659-3381   °Psychological Services °Organization         Address  Phone  Notes  °[facility]  336- 832-9600   °[facility]  336- 378-7881   °[facility] [address], [place] 1-800-853-5163 or 336-641-4981   ° °Mobile Crisis Teams °Organization         Address  Phone  Notes  °[facility], Mobile Crisis Care Unit  1-877-626-1772   °Assertive °[facility] ° [address]  [place], [place] 336-834-9664   °Sharon DeEsch [address] °[place] 336-554-5454   ° °Self-Help/Support Groups °Organization         Address  Phone             Notes  °[facility] [place] - variety of support groups  336- 373-1402 Call for more information  °Narcotics Anonymous (NA), [facility] [address], °[place]  2 meetings at this location  ° °  Residential Treatment Programs Organization         Address  Phone  Notes  [facility] [address],    [place]  1-508-199-7709   [facility]  [address] [place] [id number], [place], [place]   [place], [place] (323)369-8216 Admissions: 8am-3pm M-F  [facility] [address],    [place], [place] 716-967-8938   [facility] [address] [place], [place], [place]   [facility] [address]  [place], [place]   [facility] - Intensive Outpatient [address], [place], [place]   [facility] [place].,  [place], [place] 1-(918) 056-8365 or (413)358-9683   [facility] [address], [place], [place] Accepts Medicaid  [facility] [address]  [place] 1-867-736-7995 Substance Abuse/Addiction Treatment   [facility]         Address  Phone  Notes  [facility]  (410) 416-6558   Domenic Schwab, PhD [address] Arlis [place], [place]   (562) 255-0713 or (970)098-2649   [facility] [place], [place] (613)691-8273   [facility] [address], [place], [place] [phone] Insurance/Medicaid/sponsorship through [facility] [address]                                    [place], [place] 336-214-9511 [place] [address], [place] [phone]    Dr. Adele Schilder  985-664-5573   [facility] Dept. 1) [address], [place] 2) [place] 3)  [address], [place] [phone] 785-203-1222  303-041-5821   [place] 802-543-9262 or (573)[phone] (After Hours)       Stop taking the terazosin (Hytrin); as the most common side effect of this medication is "low blood pressure." Continue your other medications as previously directed. You may resume cardiac rehab on Friday, per your normal schedule. Increase your fluids as discussed with the Cardiologist.  Call your regular Cardiologist and your regular Urologist tomorrow morning to schedule a follow up appointment this week.  Return to the Emergency Department immediately sooner if worsening.

## 2013-09-04 NOTE — ED Notes (Signed)
Patient transported to X-ray 

## 2013-09-04 NOTE — ED Notes (Signed)
Per Rehab RN: Pt. Was on his second day of rehab and he became dizzy and lightheaded. Pt.  Was given 2 cups of Gatorade and noted with BP of 80 sys.  Pt. Was brought her per Rehab MD request. Per Pt. He admits to feeling Dizzy after taking tramadol at home and prior to rehab appointment.  Pt. Has no c/o pain or discomfort at this time.

## 2013-09-04 NOTE — H&P (Addendum)
Patient seen, examined. Available data reviewed. Agree with findings, assessment, and plan as outlined by Lorretta Harp, PA-C. He's an 80 year old gentleman, long-time patient of Dr. Tamala Julian, who underwent recent emergency CABG 06/27/2013. He's had a good recovery from surgery with LVEF of 50%. His exam reveals an alert, oriented, elderly male in no distress. His lungs are clear. Heart is regular rate and rhythm without murmur or gallop. The abdomen is soft and nontender. His sternotomy scar appears to be healing normally. There is 1+ edema of the right leg and none on the left.  I carefully reviewed his history. He had symptomatic hypotension this morning. He did not experience frank syncope. He has responded to IV fluids in the emergency room his blood pressure is normal. He took his first dose (in last night for treatment of BPH. The patient took terazosin 5 mg. He also took some pain medicine this morning before going to rehabilitation. Orthostasis and symptomatic hypotension are very commonly associated with Terazosin, especially the first dose. I suspect this was the problem his lab work and x-ray were reviewed and there are no significant abnormalities. I think he should stop Terazosin, push oral fluids, continue his other cardiac medications, and can resume cardiac rehabilitation this Friday per their normal protocol. He will followup with Dr. Tamala Julian in the office. I asked him to follow-up with Dr Risa Grill for other recommendations for his BPH.  Tyler [name], M.D. 09/04/2013 4:19 PM

## 2013-09-04 NOTE — H&P (Addendum)
Patient ID: Tyler [name] MRN: [id number], DOB/AGE: 12-07-1928   Admit date: 09/04/2013   Primary Physician: Provider Not In System Primary Cardiologist: Dr. Tamala Julian  Pt. Profile: Tyler [name] is a 80 y.o. male with a hx of CAD s/p CABG x3v (06/27/13), HTN, HL, and prior stroke who was transferred from cardiac rehab to the ED today for evaluation pre-syncope and hypotension.  Patient underwent recent LHC on 06/27/13 which demonstrated 3v CAD with critical LM stenosis. EF was 50%. He developed V. Fib arrest and was resuscitated. He then underwent emergent CABG with Dr. Prescott Gum (LIMA-LAD, SVG-PDA/PL, SVG-OM). He remained in NSR. He was readmitted 12/15-12/18 with RLE seroma and cellulitis. His seroma was aspirated. He was treated with antibiotics and discharged to SNF and then was able to go home. He recently started cardiac rehab- he goes [facility] and had his first session this past Monday. He was very sore from the exercises and took a tramadol this morning ~8:30am before his session today. He has taken tramadol before, but never with all of his other morning meds, which include Lopressor 12.5. Of note, he was recently changed from 25mg  BID to 12.5 BID because of similar episodes of lightheadedness and dizziness.  Patient admits that he felt a little lightheaded prior to starting cardiac rehab today. While using the UE exercise bike approx 11:00am he reported feeling a dizzy; a BP was taken with a SBP in the "80's." He was given PO fluids without improvements in BP. Denies CP/palpitations, no SOB/cough, no abd pain, no N/V/D, no visual changes, no focal motor weakness, no tingling/numbness in extremities, no ataxia, no slurred speech, no facial droop. Patient states that he has been eating and drinking well. Orthostatics done. 103/48-->111/50-->98/54 (lying, sitting, standing). Given IV fluids in ED with improvement in blood pressure.    Problem List  Past Medical History  Diagnosis Date  .  Coronary atherosclerosis   . Osteoarthritis   . Hypertension   . Hyperlipidemia   . Erectile dysfunction   . CVA (cerebral infarction)   . Stroke   . GERD (gastroesophageal reflux disease)   . H/O hiatal hernia     Past Surgical History  Procedure Laterality Date  . Coronary artery bypass graft N/A 06/27/2013    Procedure: CORONARY ARTERY BYPASS GRAFTING (CABG);  Surgeon: Ivin Poot, MD;  Location: [facility];  Service: Open Heart Surgery;  Laterality: N/A;  times four utilizing the left internal mammary artery and the right greater saphenous vein harvested endoscopically  . Tee without cardioversion N/A 06/27/2013    Procedure: TRANSESOPHAGEAL ECHOCARDIOGRAM (TEE);  Surgeon: Ivin Poot, MD;  Location: [facility];  Service: Open Heart Surgery;  Laterality: N/A;  . Endovein harvest of greater saphenous vein Right 06/27/2013    Procedure: ENDOVEIN HARVEST OF GREATER SAPHENOUS VEIN;  Surgeon: Ivin Poot, MD;  Location: [facility];  Service: Open Heart Surgery;  Laterality: Right;  . Inner ear surgery Left 1982     Allergies  No Known Allergies   Home Medications  Prior to Admission medications   Medication Sig Start Date End Date Taking? Authorizing Provider  atorvastatin (LIPITOR) 10 MG tablet Take 1 tablet (10 mg total) by mouth daily. 08/29/13  Yes Belva Crome III, MD  Cholecalciferol (VITAMIN D-3) 1000 UNITS CAPS Take 1 capsule by mouth daily.    Yes Historical Provider, MD  clopidogrel (PLAVIX) 75 MG tablet Take 1 tablet (75 mg total) by mouth daily with breakfast. 08/29/13  Yes Lyn Records III, MD  folic acid (FOLVITE) 800 MCG tablet Take 800 mcg by mouth daily.    Yes Historical Provider, MD  lisinopril (PRINIVIL,ZESTRIL) 5 MG tablet Take 1 tablet (5 mg total) by mouth daily. 08/29/13  Yes Lyn Records III, MD  metoprolol tartrate (LOPRESSOR) 25 MG tablet Take 0.5 tablets (12.5 mg total) by mouth 2 (two) times daily. 08/29/13  Yes Lyn Records III, MD  Multiple Vitamin  (MULTIVITAMIN) tablet Take 1 tablet by mouth daily.   Yes Historical Provider, MD  Omega-3 Fatty Acids (FISH OIL PO) Take 1,000 Units by mouth daily.    Yes Historical Provider, MD  oxyCODONE (OXY IR/ROXICODONE) 5 MG immediate release tablet Take 1-2 tablets (5-10 mg total) by mouth every 4 (four) hours as needed for severe pain. 07/12/13  Yes Erin Barrett, PA-C  terazosin (HYTRIN) 5 MG capsule Take 1 capsule (5 mg total) by mouth at bedtime. 08/29/13  Yes Lyn Records III, MD  traMADol (ULTRAM) 50 MG tablet Take 1 tablet (50 mg total) by mouth every 6 (six) hours as needed for moderate pain. 07/03/13  Yes Erin Barrett, PA-C  aspirin 81 MG tablet Take 81 mg by mouth daily.    Historical Provider, MD  nitroGLYCERIN (NITROSTAT) 0.4 MG SL tablet Place 1 tablet (0.4 mg total) under the tongue every 5 (five) minutes as needed for chest pain. 08/29/13   Lesleigh Noe, MD    Family History  Family History  Problem Relation Age of Onset  . Heart disease Sister   . Heart disease Brother     Social History  History   Social History  . Marital Status: Married    Spouse Name: N/A    Number of Children: N/A  . Years of Education: N/A   Occupational History  . Not on file.   Social History Main Topics  . Smoking status: Never Smoker   . Smokeless tobacco: Never Used  . Alcohol Use: Yes     Comment: Rare  . Drug Use: No  . Sexual Activity: Not on file   Other Topics Concern  . Not on file   Social History Narrative  . No narrative on file    All other systems reviewed and are otherwise negative except as noted above.  Physical Exam  Blood pressure 121/64, pulse 78, resp. rate 18, SpO2 98.00%.  General: Pleasant, NAD Psych: Normal affect. Neuro: Alert and oriented X 3. Moves all extremities spontaneously. HEENT: Normal  Neck: Supple without bruits or JVD. Lungs:  Resp regular and unlabored, CTA. CABG scar healing well Heart: RRR no s3, s4, or murmurs. Abdomen: Soft,  non-tender, non-distended, BS + x 4.  Extremities: No clubbing, cyanosis or 1+ pitting edema in LE. DP/PT/Radials 2+ and equal bilaterally. Scarring over right leg at vein graft harvest site   Labs   Recent Labs  09/04/13 1257  TROPONINI <0.30   Lab Results  Component Value Date   WBC 11.1* 07/08/2013   HGB 8.3* 07/08/2013   HCT 26.4* 07/08/2013   MCV 88.3 07/08/2013   PLT 324 07/08/2013    Recent Labs Lab 08/29/13 0752 09/04/13 1257  NA  --  141  K  --  4.5  CL  --  103  CO2  --  25  BUN  --  17  CREATININE  --  1.15  CALCIUM  --  8.9  PROT 6.6  --   BILITOT 0.6  --  ALKPHOS 50  --   ALT 12  --   AST 17  --   GLUCOSE  --  94   Lab Results  Component Value Date   CHOL 137 08/29/2013   HDL 44.30 08/29/2013   LDLCALC 77 08/29/2013   TRIG 80.0 08/29/2013      Radiology/Studies  Dg Chest 2 View  09/04/2013   CLINICAL DATA:  Weakness  EXAM: CHEST  2 VIEW  COMPARISON:  07/24/2013  FINDINGS: Cardiomediastinal silhouette is stable. Status post median sternotomy. Again noted chronic elevation of the right hemidiaphragm. Osteopenia and mild degenerative changes thoracic spine. No acute infiltrate or pulmonary edema.  IMPRESSION: No active cardiopulmonary disease.    ECG  NSR 80  ASSESSMENT AND PLAN Delawrence Fridman is a 80 y.o. male with a hx of CAD s/p CABG x3v (06/27/13), HTN, HL, and prior stroke who was transferred from cardiac rehab to the ED for pre-syncope and hypotension.   Pre-syncope/hypotension-  -Orthostatics done. 103/48-->111/50-->98/54 (lying, sitting, standing).  -Given IV fluids in ED with improvement in blood pressure. Currently 121/64 and he is feeling well.  - Likely multifactorial secondary to polypharmacy, recent major surgery and physical deconditioning and new onset physical exercise program. He also has a predisposition to lightheadedness with BB therapy. - Will discontinue lisinopril for now. Adding this back can be discussed at his next appointment  with Dr. Tamala Julian. He is stable to be discharged home from a cardiac standpoint.  - first dose of alpha blocker last night  CAD s/p CABG: No chest pain or SOB. EKG with no acute ST or TW changes. Troponin negative. Continue ASA, Plavix, statin, beta blocker, ACEI.   Hyperlipidemia: Continue statin.  Hx of CVA: Continue ASA and Plavix.  Dispo- d/c terazosin. dispo home. All other meds the same   Tyrell Antonio, PA-C 09/04/2013, 2:18 PM  Pager [phone]

## 2013-09-04 NOTE — Telephone Encounter (Signed)
Noted  

## 2013-09-04 NOTE — ED Provider Notes (Signed)
CSN: [id number]     Arrival date & time 09/04/13  1129 History   First MD Initiated Contact with Patient 09/04/13 1144     Chief Complaint  Patient presents with  . Hypotension      HPI Pt was seen at 1140. Per pt, his family and Cardiac Rehab staff, c/o sudden onset and resolution of one episode of near syncope that occurred while he was using an UE exercise bike approx 1100 PTA. Cardiac Rehab staff states pt's SBP was "80's." Pt given PO fluids without change. Pt endorses taking a tramadol at 0830. States he has taken tramadol previously without any adverse reaction. Pt also endorses taking Hytrin last night for the first time. Denies CP/palpitations, no SOB/cough, no abd pain, no N/V/D, no visual changes, no focal motor weakness, no tingling/numbness in extremities, no ataxia, no slurred speech, no facial droop.    Past Medical History  Diagnosis Date  . Coronary atherosclerosis   . Osteoarthritis   . Hypertension   . Hyperlipidemia   . Erectile dysfunction   . CVA (cerebral infarction)   . Stroke   . GERD (gastroesophageal reflux disease)   . H/O hiatal hernia    Past Surgical History  Procedure Laterality Date  . Coronary artery bypass graft N/A 06/27/2013    Procedure: CORONARY ARTERY BYPASS GRAFTING (CABG);  Surgeon: Ivin Poot, MD;  Location: [facility];  Service: Open Heart Surgery;  Laterality: N/A;  times four utilizing the left internal mammary artery and the right greater saphenous vein harvested endoscopically  . Tee without cardioversion N/A 06/27/2013    Procedure: TRANSESOPHAGEAL ECHOCARDIOGRAM (TEE);  Surgeon: Ivin Poot, MD;  Location: [place];  Service: Open Heart Surgery;  Laterality: N/A;  . Endovein harvest of greater saphenous vein Right 06/27/2013    Procedure: ENDOVEIN HARVEST OF GREATER SAPHENOUS VEIN;  Surgeon: Ivin Poot, MD;  Location: [place] [place];  Service: Open Heart Surgery;  Laterality: Right;  . Inner ear surgery Left 1982   Family History   Problem Relation Age of Onset  . Heart disease Sister   . Heart disease Brother    History  Substance Use Topics  . Smoking status: Never Smoker   . Smokeless tobacco: Never Used  . Alcohol Use: Yes     Comment: Rare    Review of Systems ROS: Statement: All systems negative except as marked or noted in the HPI; Constitutional: Negative for fever and chills. ; ; Eyes: Negative for eye pain, redness and discharge. ; ; ENMT: Negative for ear pain, hoarseness, nasal congestion, sinus pressure and sore throat. ; ; Cardiovascular: Negative for chest pain, palpitations, diaphoresis, dyspnea and peripheral edema. ; ; Respiratory: Negative for cough, wheezing and stridor. ; ; Gastrointestinal: Negative for nausea, vomiting, diarrhea, abdominal pain, blood in stool, hematemesis, jaundice and rectal bleeding. . ; ; Genitourinary: Negative for dysuria, flank pain and hematuria. ; ; Musculoskeletal: Negative for back pain and neck pain. Negative for swelling and trauma.; ; Skin: Negative for pruritus, rash, abrasions, blisters, bruising and skin lesion.; ; Neuro: +lightheadedness, near syncope. Negative for headache and neck stiffness. Negative for weakness, altered level of consciousness , altered mental status, extremity weakness, paresthesias, involuntary movement, seizure and syncope.      Allergies  Review of patient's allergies indicates no known allergies.  Home Medications   Current Outpatient Rx  Name  Route  Sig  Dispense  Refill  . atorvastatin (LIPITOR) 10 MG tablet   Oral   Take  1 tablet (10 mg total) by mouth daily.   90 tablet   3   . Cholecalciferol (VITAMIN D-3) 1000 UNITS CAPS   Oral   Take 1 capsule by mouth daily.          . clopidogrel (PLAVIX) 75 MG tablet   Oral   Take 1 tablet (75 mg total) by mouth daily with breakfast.   90 tablet   3   . folic acid (FOLVITE) [id number] MCG tablet   Oral   Take 800 mcg by mouth daily.          [name] lisinopril (PRINIVIL,ZESTRIL) 5  MG tablet   Oral   Take 1 tablet (5 mg total) by mouth daily.   90 tablet   3   . metoprolol tartrate (LOPRESSOR) 25 MG tablet   Oral   Take 0.5 tablets (12.5 mg total) by mouth 2 (two) times daily.   90 tablet   3   . Multiple Vitamin (MULTIVITAMIN) tablet   Oral   Take 1 tablet by mouth daily.         . Omega-3 Fatty Acids (FISH OIL PO)   Oral   Take 1,000 Units by mouth daily.          [name] oxyCODONE (OXY IR/ROXICODONE) 5 MG immediate release tablet   Oral   Take 1-2 tablets (5-10 mg total) by mouth every 4 (four) hours as needed for severe pain.   30 tablet   0   . terazosin (HYTRIN) 5 MG capsule   Oral   Take 1 capsule (5 mg total) by mouth at bedtime.   90 capsule   3   . traMADol (ULTRAM) 50 MG tablet   Oral   Take 1 tablet (50 mg total) by mouth every 6 (six) hours as needed for moderate pain.   30 tablet   0   . aspirin 81 MG tablet   Oral   Take 81 mg by mouth daily.         . nitroGLYCERIN (NITROSTAT) 0.4 MG SL tablet   Sublingual   Place 1 tablet (0.4 mg total) under the tongue every 5 (five) minutes as needed for chest pain.   25 tablet   3    BP 98/54  Pulse 83  Resp 23  SpO2 98% Filed Vitals:   09/04/13 1145 09/04/13 1400 09/04/13 1600 09/04/13 1630  BP: 98/54 121/64 115/76 131/79  Pulse: 83 78 87 88  Resp: 23 18 23 19   SpO2: 98% 98% 97% 96%    Physical Exam 1145: Physical examination:  Nursing notes reviewed; Vital signs and O2 SAT reviewed;  Constitutional: Well developed, Well nourished, In no acute distress; Head:  Normocephalic, atraumatic; Eyes: EOMI, PERRL, No scleral icterus; ENMT: Mouth and pharynx normal, Mucous membranes dry; Neck: Supple, Full range of motion, No lymphadenopathy; Cardiovascular: Regular rate and rhythm, No  gallop; Respiratory: Breath sounds clear & equal bilaterally, No wheezes.  Speaking full sentences with ease, Normal respiratory effort/excursion; Chest: Nontender, Movement normal; Abdomen: Soft,  Nontender, Nondistended, Normal bowel sounds; Genitourinary: No CVA tenderness; Extremities: Pulses normal, No tenderness, No edema, No calf edema or asymmetry.; Neuro: AA&Ox3, Major CN grossly intact. No facial droop. Speech clear. Moves all extremities on stretcher spontaneously and to command without apparent gross focal motor deficits.; Skin: Color normal, Warm, Dry.   ED Course  Procedures    EKG Interpretation   None       MDM  MDM Reviewed: previous chart, nursing  note and vitals Reviewed previous: labs and ECG Interpretation: labs, ECG and x-ray    Date: 09/04/2013  Rate: 80  Rhythm: normal sinus rhythm  QRS Axis: normal  Intervals: normal  ST/T Wave abnormalities: normal  Conduction Disutrbances:none  Narrative Interpretation:   Old EKG Reviewed: unchanged; no significant changes compared to previous EKG dated 06/28/2013.  Results for orders placed during the hospital encounter of 09/04/13  CBC WITH DIFFERENTIAL      Result Value Ref Range   WBC 7.8  4.0 - 10.5 K/uL   RBC 3.83 (*) 4.22 - 5.81 MIL/uL   Hemoglobin 11.0 (*) 13.0 - 17.0 g/dL   HCT 33.9 (*) 39.0 - 52.0 %   MCV 88.5  78.0 - 100.0 fL   MCH 28.7  26.0 - 34.0 pg   MCHC 32.4  30.0 - 36.0 g/dL   RDW 15.7 (*) 11.5 - 15.5 %   Platelets 178  150 - 400 K/uL   Neutrophils Relative % 68  43 - 77 %   Lymphocytes Relative 20  12 - 46 %   Monocytes Relative 8  3 - 12 %   Eosinophils Relative 3  0 - 5 %   Basophils Relative 1  0 - 1 %   Neutro Abs 5.3  1.7 - 7.7 K/uL   Lymphs Abs 1.6  0.7 - 4.0 K/uL   Monocytes Absolute 0.6  0.1 - 1.0 K/uL   Eosinophils Absolute 0.2  0.0 - 0.7 K/uL   Basophils Absolute 0.1  0.0 - 0.1 K/uL   RBC Morphology ELLIPTOCYTES    BASIC METABOLIC PANEL      Result Value Ref Range   Sodium 141  137 - 147 mEq/L   Potassium 4.5  3.7 - 5.3 mEq/L   Chloride 103  96 - 112 mEq/L   CO2 25  19 - 32 mEq/L   Glucose, Bld 94  70 - 99 mg/dL   BUN 17  6 - 23 mg/dL   Creatinine, Ser 1.15  0.50  - 1.35 mg/dL   Calcium 8.9  8.4 - 10.5 mg/dL   GFR calc non Af Amer 57 (*) >90 mL/min   GFR calc Af Amer 66 (*) >90 mL/min  URINALYSIS, ROUTINE W REFLEX MICROSCOPIC      Result Value Ref Range   Color, Urine YELLOW  YELLOW   APPearance CLEAR  CLEAR   Specific Gravity, Urine 1.011  1.005 - 1.030   pH 6.5  5.0 - 8.0   Glucose, UA NEGATIVE  NEGATIVE mg/dL   Hgb urine dipstick NEGATIVE  NEGATIVE   Bilirubin Urine NEGATIVE  NEGATIVE   Ketones, ur NEGATIVE  NEGATIVE mg/dL   Protein, ur NEGATIVE  NEGATIVE mg/dL   Urobilinogen, UA 1.0  0.0 - 1.0 mg/dL   Nitrite NEGATIVE  NEGATIVE   Leukocytes, UA NEGATIVE  NEGATIVE  TROPONIN I      Result Value Ref Range   Troponin I <0.30  <0.30 ng/mL   Dg Chest 2 View 09/04/2013   CLINICAL DATA:  Weakness  EXAM: CHEST  2 VIEW  COMPARISON:  07/24/2013  FINDINGS: Cardiomediastinal silhouette is stable. Status post median sternotomy. Again noted chronic elevation of the right hemidiaphragm. Osteopenia and mild degenerative changes thoracic spine. No acute infiltrate or pulmonary edema.  IMPRESSION: No active cardiopulmonary disease.   Electronically Signed   By: Lahoma Crocker M.D.   On: 09/04/2013 12:54    1400:  +orthostatic; will dose judicious IVF.  T/C to Cardiology, case  discussed, including:  HPI, pertinent PM/SHx, VS/PE, dx testing, ED course and treatment:  Agreeable to evaluate in the ED.  1630:  BP stable after IVF. Has tol PO well while in the ED. Continues to deny CP/SOB. Cards Dr. Burt Knack has evaluated pt in the ED: hypotension likely due to taking hytrin; requests EDP to d/c pt with instructions (see his consult note).     Alfonzo Feller, DO 09/07/13 1145

## 2013-09-04 NOTE — Progress Notes (Signed)
Tyler [name] complained of feeling lightheaded on the arm ergometer this morning at cardiac rehab. Tyler [name] had completed his other two exercise stations without complaints.Tyler [name] agreed to use a rolling walker for stability this morning.Blood pressure 106/86. Telemetry rhythm Sinus in the 90's. Patient given Gatorade and said he felt better.  Patient participated in relaxation after exercise. Upon check out blood pressure 82/60. Tyler [name] complained of feeling dizzy again.  Patient given a second cup of Gatorade. Telemetry rhythm Sinus 85.  Patients feet elevated. Repeat blood pressure remained in the 80's after Gatorade completed Blood pressure as low 77/47. Patient taken to the treatment room to lie down, feet elevated. Oxygen saturation 97% on room. Tyler Kicks FNP-C called and advised of low blood pressure. Advised that patient be taken to the ED for further evaluation. Patient's wife notified and came in from the parking lot to accompany patient to the ED. Blood pressure prior to transport to the ED 89/48 via automatic BP cuff heart rate 73. Patient transported to the Ed via stretcher on [employer]. Report given to ED RN and ED physician. Patient told the ED physician that he had taken some pain medication this morning prior to coming to exercise at cardiac rehab.  Tyler [name] reported that he felt a little better in the emergency department.

## 2013-09-05 LAB — URINE CULTURE

## 2013-09-06 ENCOUNTER — Encounter (HOSPITAL_COMMUNITY)
Admission: RE | Admit: 2013-09-06 | Discharge: 2013-09-06 | Disposition: A | Discharge status: Home / Self Care | Payer: Medicare Other | Source: Ambulatory Visit | Attending: Interventional Cardiology | Admitting: Interventional Cardiology

## 2013-09-06 ENCOUNTER — Telehealth: Payer: Self-pay | Admitting: Cardiology

## 2013-09-06 NOTE — Telephone Encounter (Signed)
Cardiac rehab called.  Pt's BP again down to 79T systolic.  With drinking gatorade his BP came upto 91 systolic.  He was mildly lightheaded not the severe hypotension and dizziness of Wed.  I told pt and his wife to stop lisinopril.  Ok to return to rehab on Monday.  He has appt on Friday with Dr. Marlou Porch.

## 2013-09-06 NOTE — H&P (Signed)
See previous note. Pt with hypotension after first dose of terazosin. Will stop this.

## 2013-09-06 NOTE — Progress Notes (Signed)
Will fax  Today's exercise flow sheets to Dr. Tamala Julian office for review.

## 2013-09-06 NOTE — Progress Notes (Signed)
Tyler [name] returned to exercise today per Dr Tamala Julian.  Initial blood pressure 109/60  Tyler [name] exercised without difficulty initially. Tyler [name] exercised on the arm ergometer blood pressure 104/64.  Patient given water repeat blood pressure 86/52 towards the end of exercise.  Tyler [name] reports feeling a little lightheaded.  Patient given Gatorade repeat blood pressure 93/58.   Tyler [name] called and notified. Patient instructed to stop his lisinopril.Patient wife spoke with Tyler Kicks over the phone about the medication change.  Repeat sitting blood pressure 90/56 sitting 91/57 standing.  Tyler [name] said to repeat Gatorade.  Tyler [name] drank second Gatorade and left without further complaints. Patients wife drove the patient home.  Tyler Kicks said the patient may return to exercise on Monday.

## 2013-09-09 ENCOUNTER — Encounter (HOSPITAL_COMMUNITY)
Admission: RE | Admit: 2013-09-09 | Discharge: 2013-09-09 | Disposition: A | Discharge status: Home / Self Care | Payer: Medicare Other | Source: Ambulatory Visit | Attending: Interventional Cardiology | Admitting: Interventional Cardiology

## 2013-09-09 ENCOUNTER — Telehealth: Payer: Self-pay | Admitting: Cardiology

## 2013-09-09 NOTE — Progress Notes (Signed)
Tyler [name]'s initial blood pressure was 82/50 heart rate 95.   Patient reported feeling slightly lightheaded walking into cardiac rehab.  Patient given water and Gatorade repeat blood pressure 90/52 sitting  88/50. Blood pressure recheck 92/50.  Patient wife  Tyler [name] brought in Tyler [name]'s bottles of medication.  Upon review Tyler [name] had two bottles of terazosin and was still taking this medication although it was stopped last week. I separated the medications for the patient and his wife and put the medications that the patient is not using now together with a rubber band. Cecilie Kicks FNP-C called and notified of low blood pressure this morning.   Patient instructed to stop taking his terazosin. Cecilie Kicks FNP-C instructed the patients wife the Tyler [name] only take one dose of his  Metoprolol today and to resume taking his metoprolol tomorrow twice a day.  Repeat blood pressure 96/52 sitting.  Standing blood pressure 89/52 Gatorade repeated.  Recheck blood pressure 121/71 standing.  [name] denies complaints or symptoms upon exit from cardiac rehab. Advised patient not to return to exercise until after he follows up with Dr Marlou Porch on 09/13/2013. Patient and wife state understanding.

## 2013-09-09 NOTE — Telephone Encounter (Signed)
Pt again in cardiac rehab with BP in the 11X systolic.  On review of meds he was still taking the hytrin.  Cardiac rehab nurse reviewed with his wife and she knows what he should be taking.  We held lopressor this am but will resume this pm.  He is to see Dr. Etter Sjogren on Friday.  His ACE my be able to be resumed at that time.

## 2013-09-11 ENCOUNTER — Encounter (HOSPITAL_COMMUNITY): Payer: Medicare Other

## 2013-09-13 ENCOUNTER — Encounter (HOSPITAL_COMMUNITY): Admission: RE | Admit: 2013-09-13 | Payer: Medicare Other | Source: Ambulatory Visit

## 2013-09-13 ENCOUNTER — Ambulatory Visit (INDEPENDENT_AMBULATORY_CARE_PROVIDER_SITE_OTHER): Payer: Medicare Other | Admitting: Cardiology

## 2013-09-13 ENCOUNTER — Encounter: Payer: Self-pay | Admitting: Cardiology

## 2013-09-13 ENCOUNTER — Telehealth: Payer: Self-pay | Admitting: Interventional Cardiology

## 2013-09-13 VITALS — Ht 68.0 in | Wt 203.0 lb

## 2013-09-13 DIAGNOSIS — I951 Orthostatic hypotension: Secondary | ICD-10-CM

## 2013-09-13 DIAGNOSIS — E7800 Pure hypercholesterolemia, unspecified: Secondary | ICD-10-CM

## 2013-09-13 DIAGNOSIS — Z8673 Personal history of transient ischemic attack (TIA), and cerebral infarction without residual deficits: Secondary | ICD-10-CM

## 2013-09-13 DIAGNOSIS — I2510 Atherosclerotic heart disease of native coronary artery without angina pectoris: Secondary | ICD-10-CM

## 2013-09-13 DIAGNOSIS — I10 Essential (primary) hypertension: Secondary | ICD-10-CM

## 2013-09-13 NOTE — Telephone Encounter (Signed)
New message    [facility] with 15pills instead of a 90day supply for the atovastatin 10mg  and they only got a 30dy supply of the metoprolol 25mg .

## 2013-09-13 NOTE — Telephone Encounter (Signed)
Returned call to patient's wife she stated husband is in our office at present trying to find out why the [place] in [place] only gave her husband 15 day supply of atorvastatin and 30 day supply of metoprolol.Dr.Smith had wrote a 90 supply of each.Stated she would like me to call pharmacy at [facility] they are unable to get through.Spoke to pharmacy tech at [facility] in [place] she stated patient will have to contact his [place] Dr.to have him rewrite 90 day prescriptions.Stated they do not accept 90 day prescriptions wrote by outside physicians.

## 2013-09-13 NOTE — Progress Notes (Signed)
[place], [address] [place], [place]  [address] Phone: [phone] Fax:  [phone]  Date:  09/13/2013   ID:  Tyler [name], DOB 01-Apr-1929, MRN [id number]  PCP:  Provider Not In System  Cardiologist:  Dr. Daneen Schick    History of Present Illness: Tyler [name] is a 80 y.o. male with a hx of CAD, HTN, HL, prior stroke who was recently evaluated by Dr. Daneen Schick for exertional chest pain.  LHC on 06/27/13 demonstrated 3v CAD with critical LM stenosis.  EF was 50%.  He developed V. Fib arrest and was resuscitated.  He was referred for emergent CABG.  He underwent CABG with Dr. Prescott Gum (LIMA-LAD, SVG-PDA/PL, SVG-OM). He remained in NSR.  He was readmitted 12/15-12/18 with RLE seroma and cellulitis. His seroma was aspirated. He was treated with antibiotics and discharged to SNF.  He is now back home.  No chest pain.  No significant dyspnea.  No orthopnea, PND.  He has some RLE edema (SVG harvest site).  No syncope.  No fevers or cough.  He started cardiac rehabilitation and was noted to have blood pressures with systolic in the 32T. On review, he was taking Hytrin. Lopressor was also held but resumed in the evening.   He felt dizzy at the time. With his ACE inhibitor as well as Hytrin were discontinued/helped. He continued with low-dose metoprolol. Please see below for plan.  Recent Labs: 07/02/2013: Pro B Natriuretic peptide (BNP) 1458.0*  08/29/2013: ALT 12; HDL Cholesterol 44.30; LDL (calc) 77  09/04/2013: Creatinine 1.15; Hemoglobin 11.0*; Potassium 4.5   Wt Readings from Last 3 Encounters:  09/13/13 203 lb (92.08 kg)  08/29/13 203 lb 7.8 oz (92.3 kg)  08/27/13 204 lb (92.534 kg)     Past Medical History  Diagnosis Date  . Coronary atherosclerosis   . Osteoarthritis   . Hypertension   . Hyperlipidemia   . Erectile dysfunction   . CVA (cerebral infarction)   . Stroke   . GERD (gastroesophageal reflux disease)   . H/O hiatal hernia     Current Outpatient Prescriptions   Medication Sig Dispense Refill  . aspirin 81 MG tablet Take 81 mg by mouth daily.      [name] atorvastatin (LIPITOR) 10 MG tablet Take 1 tablet (10 mg total) by mouth daily.  90 tablet  3  . Cholecalciferol (VITAMIN D-3) 1000 UNITS CAPS Take 1 capsule by mouth daily.       . clopidogrel (PLAVIX) 75 MG tablet Take 1 tablet (75 mg total) by mouth daily with breakfast.  90 tablet  3  . folic acid (FOLVITE) 557 MCG tablet Take 800 mcg by mouth daily.       . metoprolol tartrate (LOPRESSOR) 25 MG tablet Take 0.5 tablets (12.5 mg total) by mouth 2 (two) times daily.  90 tablet  3  . Multiple Vitamin (MULTIVITAMIN) tablet Take 1 tablet by mouth daily.      . nitroGLYCERIN (NITROSTAT) 0.4 MG SL tablet Place 1 tablet (0.4 mg total) under the tongue every 5 (five) minutes as needed for chest pain.  25 tablet  3  . Omega-3 Fatty Acids (FISH OIL PO) Take 1,000 Units by mouth daily.       [name] oxyCODONE (OXY IR/ROXICODONE) 5 MG immediate release tablet Take 1-2 tablets (5-10 mg total) by mouth every 4 (four) hours as needed for severe pain.  30 tablet  0  . traMADol (ULTRAM) 50 MG tablet Take 1 tablet (50 mg total)  by mouth every 6 (six) hours as needed for moderate pain.  30 tablet  0  . lisinopril (PRINIVIL,ZESTRIL) 5 MG tablet Take 1 tablet (5 mg total) by mouth daily.  90 tablet  3  . terazosin (HYTRIN) 5 MG capsule Take 1 capsule (5 mg total) by mouth at bedtime.  90 capsule  3   No current facility-administered medications for this visit.    Allergies:   Review of patient's allergies indicates no known allergies.   Social History:  The patient  reports that he has never smoked. He has never used smokeless tobacco. He reports that he drinks alcohol. He reports that he does not use illicit drugs.   Family History:  The patient's family history includes Heart disease in his brother and sister.   ROS:  Please see the history of present illness.      All other systems reviewed and negative.   PHYSICAL  EXAM: VS:  Ht 5\' 8"  (1.727 m)  Wt 203 lb (92.08 kg)  BMI 30.87 kg/m2 Well nourished, well developed, in no acute distress HEENT: normal Neck: no JVD at 90 degrees Cardiac:  normal S1, S2; RRR; no murmur Lungs:  clear to auscultation bilaterally, no wheezing, rhonchi or rales Abd: soft, nontender, no hepatomegaly Ext: trace - 1+ RLE edema, somewhat tense skin Skin: warm and dry Neuro:  CNs 2-12 intact, no focal abnormalities noted  EKG: 07/04/14: NSR, HR 80, NSSTTW changs     ASSESSMENT AND PLAN:  1. Orthostatic hypotension-at cardiac rehabilitation, systolic was 80, dizzy. He has been holding his lisinopril 5 mg as well as his prostate medication, Hytrin. Blood pressure currently is 116/73 while laying down, 102/67 when sitting up and 97/65 when standing initially, next minute was 120/77, and the following minute was 126/78. My suggestion to him was to wear his compression stockings. Continue to maintain hydration. I contemplated discontinuation of metoprolol 12.5 mg twice a day however given his prior ventricular fibrillatory arrest and recent bypass surgery/CAD, I would like for him to continue this very low-dose medication. Also, he had an appointment with his urologist Dr. Risa Grill I believe yesterday. He prescribed him VESIcare and tamsulosin. He is wondering if he should even take these medications as a continues to urinate at night despite previous medication. I asked him to discuss this with his urologist. 2. CAD, s/p CABG:  Doing well.  Continue ASA, Plavix, statin, beta blocker.  Okay to restart cardiac rehab. 3. Hypertension:  Controlled. In fact low at times as above. 4. Hyperlipidemia:  Continue statin. LDL goal less than 70. 5. Prior Stroke:  Continue ASA and Plavix.  6. Disposition:  F/u with Dr. Daneen Schick 1-2 mos.   Signed, Richardson Dopp, PA-C  09/13/2013 8:57 AM

## 2013-09-13 NOTE — Patient Instructions (Addendum)
Your physician has recommended you make the following change in your medication:   1. Stop Lisinopril 2. Stop Hytrin  Your physician recommends that you schedule a follow-up appointment in: 1 month with Dr. Tamala Julian

## 2013-09-16 ENCOUNTER — Encounter (HOSPITAL_COMMUNITY): Payer: Medicare Other

## 2013-09-16 ENCOUNTER — Telehealth (HOSPITAL_COMMUNITY): Payer: Self-pay | Admitting: *Deleted

## 2013-09-18 ENCOUNTER — Encounter (HOSPITAL_COMMUNITY): Payer: Medicare Other

## 2013-09-20 ENCOUNTER — Encounter (HOSPITAL_COMMUNITY): Payer: Medicare Other

## 2013-09-23 ENCOUNTER — Encounter (HOSPITAL_COMMUNITY)
Admission: RE | Admit: 2013-09-23 | Discharge: 2013-09-23 | Disposition: A | Discharge status: Home / Self Care | Payer: Medicare Other | Source: Ambulatory Visit | Attending: Interventional Cardiology | Admitting: Interventional Cardiology

## 2013-09-23 DIAGNOSIS — Z8674 Personal history of sudden cardiac arrest: Secondary | ICD-10-CM | POA: Insufficient documentation

## 2013-09-23 DIAGNOSIS — I509 Heart failure, unspecified: Secondary | ICD-10-CM | POA: Insufficient documentation

## 2013-09-23 DIAGNOSIS — I2510 Atherosclerotic heart disease of native coronary artery without angina pectoris: Secondary | ICD-10-CM | POA: Insufficient documentation

## 2013-09-23 DIAGNOSIS — IMO0001 Reserved for inherently not codable concepts without codable children: Secondary | ICD-10-CM | POA: Insufficient documentation

## 2013-09-23 DIAGNOSIS — Z5189 Encounter for other specified aftercare: Secondary | ICD-10-CM | POA: Insufficient documentation

## 2013-09-23 DIAGNOSIS — Z951 Presence of aortocoronary bypass graft: Secondary | ICD-10-CM | POA: Insufficient documentation

## 2013-09-23 DIAGNOSIS — I5032 Chronic diastolic (congestive) heart failure: Secondary | ICD-10-CM | POA: Insufficient documentation

## 2013-09-23 NOTE — Progress Notes (Signed)
Reviewed home exercise with pt today.  Pt plans to walk as tolerated in the house and continue his PT exercises at home for exercise.  Reviewed THR, pulse, RPE, sign and symptoms, NTG use, and when to call 911 or MD.  Pt voiced understanding. Alberteen Sam, MA, ACSM RCEP

## 2013-09-23 NOTE — Progress Notes (Signed)
Tyler [name] returned to exercise at cardiac rehab this morning. Vital signs stable. Tyler [name] used a rolling walker for stability. No complaints voiced. Will continue to monitor the patient throughout  the program.

## 2013-09-25 ENCOUNTER — Encounter (HOSPITAL_COMMUNITY)
Admission: RE | Admit: 2013-09-25 | Discharge: 2013-09-25 | Disposition: A | Discharge status: Home / Self Care | Payer: Medicare Other | Source: Ambulatory Visit | Attending: Interventional Cardiology | Admitting: Interventional Cardiology

## 2013-09-27 ENCOUNTER — Telehealth: Payer: Self-pay | Admitting: Interventional Cardiology

## 2013-09-27 ENCOUNTER — Encounter (HOSPITAL_COMMUNITY)
Admission: RE | Admit: 2013-09-27 | Discharge: 2013-09-27 | Disposition: A | Discharge status: Home / Self Care | Payer: Medicare Other | Source: Ambulatory Visit | Attending: Interventional Cardiology | Admitting: Interventional Cardiology

## 2013-09-27 ENCOUNTER — Encounter: Payer: Self-pay | Admitting: Interventional Cardiology

## 2013-09-27 NOTE — Telephone Encounter (Signed)
New problem   Pt was on bumplist and need to r/s his appt. Pt stated he was going out of town 1st wk of April and won't return til July. Pt want to know if he can be worked in. Please call pt

## 2013-09-30 ENCOUNTER — Encounter (HOSPITAL_COMMUNITY)
Admission: RE | Admit: 2013-09-30 | Discharge: 2013-09-30 | Disposition: A | Discharge status: Home / Self Care | Payer: Medicare Other | Source: Ambulatory Visit | Attending: Interventional Cardiology | Admitting: Interventional Cardiology

## 2013-09-30 NOTE — Telephone Encounter (Signed)
pt appt already made with Dr.Smith and pt already aware

## 2013-09-30 NOTE — Progress Notes (Signed)
Tyler [name] 80 y.o. male Nutrition Note Spoke with pt. Pt reports he has lost 50 lbs over the past 2 years "because I'm not drinking my double Martini's anymore." Pt wants to lose wt and has not been actively trying to lose wt at this time. Wt loss tips reviewed. Nutrition Plan and Nutrition Survey goals reviewed with pt. Pt is following Step 1 of the Therapeutic Lifestyle Changes diet. Age-appropriate nutrition recommendations discussed. Pt is pre-diabetic according to his most recent A1c. A1c discussed. Pt expressed understanding of the information reviewed. Pt aware of nutrition education classes offered and declined to attend nutrition classes and declined nutrition class handouts.  Nutrition Diagnosis Food-and nutrition-related knowledge deficit related to lack of exposure to information as related to diagnosis of: ? CVD ? Pre-DM (A1c 6.0) Obesity related to excessive energy intake as evidenced by a BMI of 30.9  Nutrition RX/ Estimated Daily Nutrition Needs for: wt loss  1400-1900 Kcal, 35-50 gm fat, 9-13 gm sat fat, 1.4-1.9 gm trans-fat, <1500 mg sodium   Nutrition Intervention   Pt's individual nutrition plan including cholesterol goals reviewed with pt.   Benefits of adopting Therapeutic Lifestyle Changes discussed when Medficts reviewed.   Pt to attend the Portion Distortion class   Continue client-centered nutrition education by RD, as part of interdisciplinary care.  Goal(s)   Pt to identify food quantities necessary to achieve: ? wt loss to a goal wt of 179-198 lb (81.4-90 kg) at graduation from cardiac rehab.   Monitor and Evaluate progress toward nutrition goal with team. Nutrition Risk:  Low   Derek Mound, M.Ed, RD, LDN, CDE 09/30/2013 11:30 AM

## 2013-10-02 ENCOUNTER — Encounter (HOSPITAL_COMMUNITY)
Admission: RE | Admit: 2013-10-02 | Discharge: 2013-10-02 | Disposition: A | Discharge status: Home / Self Care | Payer: Medicare Other | Source: Ambulatory Visit | Attending: Interventional Cardiology | Admitting: Interventional Cardiology

## 2013-10-04 ENCOUNTER — Encounter (HOSPITAL_COMMUNITY): Payer: Medicare Other

## 2013-10-07 ENCOUNTER — Encounter (HOSPITAL_COMMUNITY)
Admission: RE | Admit: 2013-10-07 | Discharge: 2013-10-07 | Disposition: A | Discharge status: Home / Self Care | Payer: Medicare Other | Source: Ambulatory Visit | Attending: Interventional Cardiology | Admitting: Interventional Cardiology

## 2013-10-07 ENCOUNTER — Ambulatory Visit: Payer: Medicare Other | Admitting: Interventional Cardiology

## 2013-10-09 ENCOUNTER — Encounter (HOSPITAL_COMMUNITY)
Admission: RE | Admit: 2013-10-09 | Discharge: 2013-10-09 | Disposition: A | Discharge status: Home / Self Care | Payer: Medicare Other | Source: Ambulatory Visit | Attending: Interventional Cardiology | Admitting: Interventional Cardiology

## 2013-10-11 ENCOUNTER — Encounter (HOSPITAL_COMMUNITY)
Admission: RE | Admit: 2013-10-11 | Discharge: 2013-10-11 | Disposition: A | Discharge status: Home / Self Care | Payer: Medicare Other | Source: Ambulatory Visit | Attending: Interventional Cardiology | Admitting: Interventional Cardiology

## 2013-10-14 ENCOUNTER — Encounter (HOSPITAL_COMMUNITY)
Admission: RE | Admit: 2013-10-14 | Discharge: 2013-10-14 | Disposition: A | Discharge status: Home / Self Care | Payer: Medicare Other | Source: Ambulatory Visit | Attending: Interventional Cardiology | Admitting: Interventional Cardiology

## 2013-10-16 ENCOUNTER — Encounter (HOSPITAL_COMMUNITY)
Admission: RE | Admit: 2013-10-16 | Discharge: 2013-10-16 | Disposition: A | Discharge status: Home / Self Care | Payer: Medicare Other | Source: Ambulatory Visit | Attending: Interventional Cardiology | Admitting: Interventional Cardiology

## 2013-10-16 NOTE — Progress Notes (Signed)
Tyler [name] last day of exercise is today. Tyler [name] plans to continue his cardiac rehab at [facility] center

## 2013-10-18 ENCOUNTER — Encounter (HOSPITAL_COMMUNITY): Payer: Medicare Other

## 2013-10-18 ENCOUNTER — Encounter: Payer: Self-pay | Admitting: Interventional Cardiology

## 2013-10-18 ENCOUNTER — Ambulatory Visit (INDEPENDENT_AMBULATORY_CARE_PROVIDER_SITE_OTHER): Payer: Medicare Other | Admitting: Interventional Cardiology

## 2013-10-18 VITALS — BP 100/74 | HR 64 | Ht 68.0 in | Wt 206.0 lb

## 2013-10-18 DIAGNOSIS — I10 Essential (primary) hypertension: Secondary | ICD-10-CM

## 2013-10-18 DIAGNOSIS — Z951 Presence of aortocoronary bypass graft: Secondary | ICD-10-CM

## 2013-10-18 DIAGNOSIS — I2510 Atherosclerotic heart disease of native coronary artery without angina pectoris: Secondary | ICD-10-CM

## 2013-10-18 DIAGNOSIS — E785 Hyperlipidemia, unspecified: Secondary | ICD-10-CM

## 2013-10-18 NOTE — Progress Notes (Signed)
Patient ID: Tyler [name], male   DOB: 1929-05-25, 80 y.o.   MRN: [id number]    [address], [address], [place]  [address] Phone: [phone] Fax:  [phone]  Date:  10/18/2013   ID:  Tyler [name], DOB August 21, 1928, MRN [id number]  PCP:  PROVIDER NOT IN SYSTEM   ASSESSMENT:  1. Coronary artery disease, stable status post coronary bypass surgery 2. Hypertension, controlled 3. Hyperlipidemia   PLAN:  1. No change in the current medical regimen 2. Resume physical activity as tolerated 3. Complete phase II cardiac rehabilitation 4. Okay to resume aquatic exercise program in [facility] 5. Clinical followup in 6 months   SUBJECTIVE: Tyler [name] is a 80 y.o. male who is now doing well after multivessel coronary artery disease for left main and class IV angina. Extremity and sternal incisions have healed. He has not had palpitations or syncope. He denies dyspnea. No episodes of syncope have occurred.   Wt Readings from Last 3 Encounters:  10/18/13 206 lb (93.441 kg)  09/13/13 203 lb (92.08 kg)  08/29/13 203 lb 7.8 oz (92.3 kg)     Past Medical History  Diagnosis Date  . Coronary atherosclerosis   . Osteoarthritis   . Hypertension   . Hyperlipidemia   . Erectile dysfunction   . CVA (cerebral infarction)   . Stroke   . GERD (gastroesophageal reflux disease)   . H/O hiatal hernia     Current Outpatient Prescriptions  Medication Sig Dispense Refill  . aspirin 81 MG tablet Take 81 mg by mouth daily.      . Cholecalciferol (VITAMIN D-3) 1000 UNITS CAPS Take 1 capsule by mouth daily.       . clopidogrel (PLAVIX) 75 MG tablet Take 1 tablet (75 mg total) by mouth daily with breakfast.  90 tablet  3  . folic acid (FOLVITE) 932 MCG tablet Take 800 mcg by mouth daily.       . metoprolol tartrate (LOPRESSOR) 25 MG tablet Take 0.5 tablets (12.5 mg total) by mouth 2 (two) times daily.  90 tablet  3  . Multiple Vitamin (MULTIVITAMIN) tablet Take 1 tablet by  mouth daily.      . nitroGLYCERIN (NITROSTAT) 0.4 MG SL tablet Place 1 tablet (0.4 mg total) under the tongue every 5 (five) minutes as needed for chest pain.  25 tablet  3  . Omega-3 Fatty Acids (FISH OIL PO) Take 1,000 Units by mouth daily.       [name] oxyCODONE (OXY IR/ROXICODONE) 5 MG immediate release tablet Take 1-2 tablets (5-10 mg total) by mouth every 4 (four) hours as needed for severe pain.  30 tablet  0  . traMADol (ULTRAM) 50 MG tablet Take 1 tablet (50 mg total) by mouth every 6 (six) hours as needed for moderate pain.  30 tablet  0   No current facility-administered medications for this visit.    Allergies:   No Known Allergies  Social History:  The patient  reports that he has never smoked. He has never used smokeless tobacco. He reports that he drinks alcohol. He reports that he does not use illicit drugs.   ROS:  Please see the history of present illness.   No fever, chills, anorexia, cough, orthopnea, PND, or other complaints   All other systems reviewed and negative.   OBJECTIVE: VS:  BP 100/74  Pulse 64  Ht 5\' 8"  (1.727 m)  Wt 206 lb (93.441 kg)  BMI 31.33 kg/m2 Well nourished,  well developed, in no acute distress, occlusion of his stated age  77: normal Neck: JVD flat. Carotid bruit absent  Cardiac:  normal S1, S2; RRR; no murmur Lungs:  clear to auscultation bilaterally, no wheezing, rhonchi or rales Abd: soft, nontender, no hepatomegaly Ext: Edema absent. Pulses 2+ and symmetric  Skin: warm and dry Neuro:  CNs 2-12 intact, no focal abnormalities noted  EKG:  Not performed       Signed, Illene Labrador III, MD 10/18/2013 9:17 AM

## 2013-10-18 NOTE — Patient Instructions (Signed)
Your physician recommends that you continue on your current medications as directed. Please refer to the Current Medication list given to you today.  Your physician wants you to follow-up in: 6-8 months You will receive a reminder letter in the mail two months in advance. If you don't receive a letter, please call our office to schedule the follow-up appointment.  Ok to resume aquatic exercise

## 2013-10-21 ENCOUNTER — Encounter (HOSPITAL_COMMUNITY): Payer: Medicare Other

## 2013-10-23 ENCOUNTER — Encounter (HOSPITAL_COMMUNITY): Payer: Medicare Other

## 2013-10-25 ENCOUNTER — Encounter (HOSPITAL_COMMUNITY): Payer: Medicare Other

## 2013-10-28 ENCOUNTER — Encounter (HOSPITAL_COMMUNITY): Payer: Medicare Other

## 2013-10-28 ENCOUNTER — Telehealth: Payer: Self-pay | Admitting: Interventional Cardiology

## 2013-10-28 NOTE — Telephone Encounter (Signed)
New message   Wife calling  Procedure done on hand today . Can he take  150 mg clindamycin .

## 2013-10-28 NOTE — Telephone Encounter (Signed)
lmom ok for pt to take antibiotic clindamycin.

## 2013-10-30 ENCOUNTER — Encounter (HOSPITAL_COMMUNITY): Payer: Medicare Other

## 2013-11-01 ENCOUNTER — Encounter (HOSPITAL_COMMUNITY): Payer: Medicare Other

## 2013-11-04 ENCOUNTER — Encounter (HOSPITAL_COMMUNITY): Payer: Medicare Other

## 2013-11-06 ENCOUNTER — Encounter (HOSPITAL_COMMUNITY): Payer: Medicare Other

## 2013-11-08 ENCOUNTER — Encounter (HOSPITAL_COMMUNITY): Payer: Medicare Other

## 2013-11-11 ENCOUNTER — Encounter (HOSPITAL_COMMUNITY): Payer: Medicare Other

## 2013-11-13 ENCOUNTER — Encounter (HOSPITAL_COMMUNITY): Payer: Medicare Other

## 2013-11-15 ENCOUNTER — Encounter (HOSPITAL_COMMUNITY): Payer: Medicare Other

## 2013-11-18 ENCOUNTER — Encounter (HOSPITAL_COMMUNITY): Payer: Medicare Other

## 2013-11-20 ENCOUNTER — Encounter (HOSPITAL_COMMUNITY): Payer: Medicare Other

## 2013-11-22 ENCOUNTER — Encounter (HOSPITAL_COMMUNITY): Payer: Medicare Other

## 2013-11-25 ENCOUNTER — Encounter (HOSPITAL_COMMUNITY): Payer: Medicare Other

## 2013-11-27 ENCOUNTER — Encounter (HOSPITAL_COMMUNITY): Payer: Medicare Other

## 2013-11-29 ENCOUNTER — Encounter (HOSPITAL_COMMUNITY): Payer: Medicare Other

## 2013-12-02 ENCOUNTER — Encounter (HOSPITAL_COMMUNITY): Payer: Medicare Other

## 2013-12-04 ENCOUNTER — Encounter (HOSPITAL_COMMUNITY): Payer: Medicare Other

## 2013-12-06 ENCOUNTER — Encounter (HOSPITAL_COMMUNITY): Payer: Medicare Other

## 2013-12-13 ENCOUNTER — Telehealth: Payer: Self-pay | Admitting: Interventional Cardiology

## 2013-12-13 NOTE — Telephone Encounter (Signed)
pt adv ok to hold plavix per Dr.Smith cardiac clearance faxed to [phone].pt verbalized understanding

## 2013-12-13 NOTE — Telephone Encounter (Signed)
New message     Pt is in [place] to have a procedure.  He said that Dr Nadyne Coombes in [facility] faxed a clearance for pt to be off plavix but they have not received it back. Did we get the clearance?

## 2014-06-30 ENCOUNTER — Ambulatory Visit: Payer: Medicare Other | Admitting: Interventional Cardiology

## 2014-07-03 ENCOUNTER — Encounter (HOSPITAL_COMMUNITY): Payer: Self-pay | Admitting: Interventional Cardiology

## 2014-07-04 ENCOUNTER — Encounter: Payer: Self-pay | Admitting: Nurse Practitioner

## 2014-07-04 ENCOUNTER — Ambulatory Visit (INDEPENDENT_AMBULATORY_CARE_PROVIDER_SITE_OTHER): Payer: Medicare Other | Admitting: Nurse Practitioner

## 2014-07-04 VITALS — BP 140/80 | HR 74 | Ht 68.0 in | Wt 215.0 lb

## 2014-07-04 DIAGNOSIS — E785 Hyperlipidemia, unspecified: Secondary | ICD-10-CM

## 2014-07-04 DIAGNOSIS — I10 Essential (primary) hypertension: Secondary | ICD-10-CM | POA: Insufficient documentation

## 2014-07-04 DIAGNOSIS — I714 Abdominal aortic aneurysm, without rupture, unspecified: Secondary | ICD-10-CM

## 2014-07-04 DIAGNOSIS — I2510 Atherosclerotic heart disease of native coronary artery without angina pectoris: Secondary | ICD-10-CM | POA: Insufficient documentation

## 2014-07-04 NOTE — Patient Instructions (Signed)
Your physician recommends that you continue on your current medications as directed. Please refer to the Current Medication list given to you today.  Your physician wants you to follow-up in: 6 months with Dr.Smith You will receive a reminder letter in the mail two months in advance. If you don't receive a letter, please call our office to schedule the follow-up appointment.  

## 2014-07-04 NOTE — Progress Notes (Signed)
Patient Name: Tyler [name] Date of Encounter: 07/04/2014  Primary Care Provider:  PROVIDER NOT IN SYSTEM Primary Cardiologist:  Linard Millers, MD   Patient Profile  80 y/o male with h/o CAD s/p CABG who presents for f/u re: pending orthopedic surgery.  Problem List   Past Medical History  Diagnosis Date  . CAD (coronary artery disease)     a. 06/2013 VF Arrest/Cath: 3VD, EF 50%;  b. 06/2013 CABG x 4: LIMA->LAD, VG->PDA->RPL, VG->OM.  [name] Osteoarthritis   . Essential hypertension   . Hyperlipidemia   . Erectile dysfunction   . CVA (cerebral infarction)   . GERD (gastroesophageal reflux disease)   . H/O hiatal hernia   . AAA (abdominal aortic aneurysm)     a. 05/2014 s/p stent grafting [facility]).   Past Surgical History  Procedure Laterality Date  . Coronary artery bypass graft N/A 06/27/2013    Procedure: CORONARY ARTERY BYPASS GRAFTING (CABG);  Surgeon: Ivin Poot, MD;  Location: [place];  Service: Open Heart Surgery;  Laterality: N/A;  times four utilizing the left internal mammary artery and the right greater saphenous vein harvested endoscopically  . Tee without cardioversion N/A 06/27/2013    Procedure: TRANSESOPHAGEAL ECHOCARDIOGRAM (TEE);  Surgeon: Ivin Poot, MD;  Location: [place];  Service: Open Heart Surgery;  Laterality: N/A;  . Endovein harvest of greater saphenous vein Right 06/27/2013    Procedure: ENDOVEIN HARVEST OF GREATER SAPHENOUS VEIN;  Surgeon: Ivin Poot, MD;  Location: [place];  Service: Open Heart Surgery;  Laterality: Right;  . Inner ear surgery Left 1982  . Left heart catheterization with coronary angiogram N/A 06/27/2013    Procedure: LEFT HEART CATHETERIZATION WITH CORONARY ANGIOGRAM;  Surgeon: Sinclair Grooms, MD;  Location: [facility] CATH LAB;  Service: Cardiovascular;  Laterality: N/A;  iabp insertion, cpr, intubation, emergent cvts    Allergies  No Known Allergies  HPI  80 y/o male with the above problem list.  He is s/p CABG x 4 one  year ago.  He has done well since CABG from a cardiac standpoint.  Last month, he underwent stent grafting of his AAA in [facility] (AAA 5.2 cm preop per pt).  He tolerated procedure well and did not suffer any cardiac complications in the perioperative period.  Prior to his surgery, he was very active, going to the gym most days of the week and exercising for at least an hour - mostly aerobic type activities.  He was able to complete that w/o chest pain, dyspnea, of significant limitations.  He denies chest pain, palpitations, dyspnea, pnd, orthopnea, n, v, dizziness, syncope, edema, weight gain, or early satiety.   He has chronic left hip pain and was told by his chiropractor that he needs a hip replacement.  He says that someone @ [facility] also said this about 5 yrs ago.  He plans to pursue surgery.  Home Medications  Prior to Admission medications   Medication Sig Start Date End Date Taking? Authorizing Provider  aspirin 81 MG tablet Take 81 mg by mouth daily.   Yes Historical Provider, MD  Cholecalciferol (VITAMIN D-3) 1000 UNITS CAPS Take 1 capsule by mouth daily.    Yes Historical Provider, MD  clopidogrel (PLAVIX) 75 MG tablet Take 1 tablet (75 mg total) by mouth daily with breakfast. 08/29/13  Yes Belva Crome III, MD  folic acid (FOLVITE) 834 MCG tablet Take 800 mcg by mouth daily.    Yes Historical Provider, MD  metoprolol tartrate (LOPRESSOR) 25 MG tablet Take 0.5 tablets (12.5 mg total) by mouth 2 (two) times daily. 08/29/13  Yes Belva Crome III, MD  Multiple Vitamin (MULTIVITAMIN) tablet Take 1 tablet by mouth daily.   Yes Historical Provider, MD  Omega-3 Fatty Acids (FISH OIL PO) Take 1,000 Units by mouth daily.    Yes Historical Provider, MD  nitroGLYCERIN (NITROSTAT) 0.4 MG SL tablet Place 1 tablet (0.4 mg total) under the tongue every 5 (five) minutes as needed for chest pain. Patient not taking: Reported on 07/04/2014 08/29/13   Belva Crome III, MD  oxyCODONE (OXY IR/ROXICODONE)  5 MG immediate release tablet Take 1-2 tablets (5-10 mg total) by mouth every 4 (four) hours as needed for severe pain. Patient not taking: Reported on 07/04/2014 07/12/13   Erin Barrett, PA-C  traMADol (ULTRAM) 50 MG tablet Take 1 tablet (50 mg total) by mouth every 6 (six) hours as needed for moderate pain. Patient not taking: Reported on 07/04/2014 07/03/13   Ellwood Handler, PA-C    Review of Systems  Overall doing well.  He denies chest pain, palpitations, dyspnea, pnd, orthopnea, n, v, dizziness, syncope, edema, weight gain, or early satiety.  All other systems reviewed and are otherwise negative except as noted above.  Physical Exam  Blood pressure 140/80, pulse 74, height 5\' 8"  (1.727 m), weight 215 lb (97.523 kg).  General: Pleasant, NAD Psych: Normal affect. Neuro: Alert and oriented X 3. Moves all extremities spontaneously. HEENT: Normal  Neck: Supple without JVD.  Soft left carotid bruit. Lungs:  Resp regular and unlabored, CTA. Heart: RRR no s3, s4, or murmurs. Abdomen: Soft, non-tender, non-distended, BS + x 4.  Extremities: No clubbing, cyanosis or edema. DP/PT/Radials 2+ and equal bilaterally.  Accessory Clinical Findings  None  Assessment & Plan  1.  CAD s/p CABG:  He is doing well from this standpoint and recently tolerated AAA stent grafting w/o complications.  He is not having any chest pain or dyspnea and up until his AAA surgery, he was exercising regularly w/o limitations.  He is hoping to have left hip surgery and plans to seek out an orthopedic opinion.  Given recent revascularization (CABG), absence of Ss, reasonably good exercise tolerance over the past year, and successful navigation of recent stent grafting, he will not require additional ischemic evaluation prior to elective orthopedic procedure.  I have requested that he touch base with his vascular surgeon in [facility] to seek clearance as well.  Of note, he is on plavix in the setting of VF arrest in  06/2013.  He did not have stents placed @ that time and thus will be able to come off of plavix for elective surgery.  Cont asa, plavix, bb.  ? Not on statin.  2.  HTN:  Stable.  3.  HL:  ? Not on statin.  4.  AAA:  S/p stent grafting last month.  No complications post-op.  5.  Dispo:  F/U Dr. Tamala Julian in 3-6 mos or sooner if necessary.    Murray Hodgkins, NP 07/04/2014, 5:18 PM

## 2014-07-30 ENCOUNTER — Encounter: Payer: Self-pay | Admitting: Interventional Cardiology

## 2014-08-15 ENCOUNTER — Other Ambulatory Visit: Payer: Self-pay | Admitting: Orthopedic Surgery

## 2014-08-22 ENCOUNTER — Ambulatory Visit (HOSPITAL_COMMUNITY)
Admission: RE | Admit: 2014-08-22 | Discharge: 2014-08-22 | Disposition: A | Discharge status: Home / Self Care | Payer: Medicare Other | Source: Ambulatory Visit | Attending: Orthopedic Surgery | Admitting: Orthopedic Surgery

## 2014-08-22 ENCOUNTER — Encounter (HOSPITAL_COMMUNITY): Payer: Self-pay

## 2014-08-22 ENCOUNTER — Encounter (HOSPITAL_COMMUNITY)
Admission: RE | Admit: 2014-08-22 | Discharge: 2014-08-22 | Disposition: A | Discharge status: Home / Self Care | Payer: Medicare Other | Source: Ambulatory Visit | Attending: Orthopedic Surgery | Admitting: Orthopedic Surgery

## 2014-08-22 DIAGNOSIS — R531 Weakness: Secondary | ICD-10-CM | POA: Insufficient documentation

## 2014-08-22 DIAGNOSIS — R0602 Shortness of breath: Secondary | ICD-10-CM | POA: Diagnosis not present

## 2014-08-22 DIAGNOSIS — Z01818 Encounter for other preprocedural examination: Secondary | ICD-10-CM

## 2014-08-22 HISTORY — DX: Pneumonia, unspecified organism: J18.9

## 2014-08-22 HISTORY — DX: Unspecified hearing loss, unspecified ear: H91.90

## 2014-08-22 HISTORY — DX: Bronchitis, not specified as acute or chronic: J40

## 2014-08-22 LAB — CBC WITH DIFFERENTIAL/PLATELET
BASOS ABS: 0 10*3/uL (ref 0.0–0.1)
Basophils Relative: 1 % (ref 0–1)
Eosinophils Absolute: 0.1 10*3/uL (ref 0.0–0.7)
Eosinophils Relative: 1 % (ref 0–5)
HCT: 31.9 % — ABNORMAL LOW (ref 39.0–52.0)
Hemoglobin: 9.9 g/dL — ABNORMAL LOW (ref 13.0–17.0)
LYMPHS ABS: 2 10*3/uL (ref 0.7–4.0)
Lymphocytes Relative: 32 % (ref 12–46)
MCH: 25.6 pg — ABNORMAL LOW (ref 26.0–34.0)
MCHC: 31 g/dL (ref 30.0–36.0)
MCV: 82.6 fL (ref 78.0–100.0)
Monocytes Absolute: 0.5 10*3/uL (ref 0.1–1.0)
Monocytes Relative: 8 % (ref 3–12)
Neutro Abs: 3.6 10*3/uL (ref 1.7–7.7)
Neutrophils Relative %: 58 % (ref 43–77)
Platelets: 180 10*3/uL (ref 150–400)
RBC: 3.86 MIL/uL — ABNORMAL LOW (ref 4.22–5.81)
RDW: 15.7 % — ABNORMAL HIGH (ref 11.5–15.5)
WBC: 6.2 10*3/uL (ref 4.0–10.5)

## 2014-08-22 LAB — URINALYSIS, ROUTINE W REFLEX MICROSCOPIC
Bilirubin Urine: NEGATIVE
GLUCOSE, UA: NEGATIVE mg/dL
Hgb urine dipstick: NEGATIVE
KETONES UR: NEGATIVE mg/dL
Leukocytes, UA: NEGATIVE
Nitrite: NEGATIVE
PROTEIN: NEGATIVE mg/dL
Specific Gravity, Urine: 1.027 (ref 1.005–1.030)
Urobilinogen, UA: 0.2 mg/dL (ref 0.0–1.0)
pH: 5.5 (ref 5.0–8.0)

## 2014-08-22 LAB — SURGICAL PCR SCREEN
MRSA, PCR: NEGATIVE
STAPHYLOCOCCUS AUREUS: NEGATIVE

## 2014-08-22 LAB — BASIC METABOLIC PANEL
ANION GAP: 4 — AB (ref 5–15)
BUN: 30 mg/dL — ABNORMAL HIGH (ref 6–23)
CO2: 29 mmol/L (ref 19–32)
Calcium: 9.2 mg/dL (ref 8.4–10.5)
Chloride: 110 mmol/L (ref 96–112)
Creatinine, Ser: 1.27 mg/dL (ref 0.50–1.35)
GFR calc non Af Amer: 50 mL/min — ABNORMAL LOW (ref >90)
GFR, EST AFRICAN AMERICAN: 59 mL/min — AB (ref >90)
Glucose, Bld: 119 mg/dL — ABNORMAL HIGH (ref 70–99)
Potassium: 4 mmol/L (ref 3.5–5.1)
Sodium: 143 mmol/L (ref 135–145)

## 2014-08-22 LAB — APTT: aPTT: 30 seconds (ref 24–37)

## 2014-08-22 LAB — PROTIME-INR
INR: 1.05 (ref 0.00–1.49)
Prothrombin Time: 13.8 seconds (ref 11.6–15.2)

## 2014-08-22 NOTE — Progress Notes (Addendum)
Last saw Dr. Linard Millers about 4 weeks ago.  (actually saw the PA).   Denies any heart pains or discomfort.  Pt states he had "chemically induced stress test" which was done after his CABG.  DA Have received surgical clearance from his PCP Dr. Andee Lineman from [place], [place] (Inside the chart)

## 2014-08-22 NOTE — Pre-Procedure Instructions (Signed)
Tyler [name]  08/22/2014   Your procedure is scheduled on:  Monday, Feb. 8th   Report to [facility] Admitting at 7:25 AM.   Call this number if you have problems the morning of surgery: [phone]   Remember:   Do not eat food or drink liquids after midnight Sunday.   Take these medicines the morning of surgery with A SIP OF WATER: One of your pain medication, Metoprolol.              STOP TAKING 4-5 DAYS PRIOR TO SURGERY, AND ANTI-INFLAMMATORIES, ASPIRIN, COUMADIN, HERBAL MEDICATIONS.   Do not wear jewelry - no rings or watches.  Do not wear lotions or colognes.   You may NOT wear deodorant the day of surgery.   Men may shave face and neck.   Do not bring valuables to the hospital.  [facility] is not responsible for any belongings or valuables.               Contacts, dentures or bridgework may not be worn into surgery.  Leave suitcase in the car. After surgery it may be brought to your room.  For patients admitted to the hospital, discharge time is determined by your treatment team.    Name and phone number of your driver:    Special Instructions: "Preparing for Surgery" instruction sheet.   Please read over the following fact sheets that you were given: Pain Booklet, Coughing and Deep Breathing, Blood Transfusion Information, MRSA Information and Surgical Site Infection Prevention

## 2014-08-22 NOTE — Progress Notes (Addendum)
   08/22/14 1525  OBSTRUCTIVE SLEEP APNEA  Have you ever been diagnosed with sleep apnea through a sleep study? No  Do you snore loudly (loud enough to be heard through closed doors)?  0  Do you often feel tired, fatigued, or sleepy during the daytime? 0  Has anyone observed you stop breathing during your sleep? 0  Do you have, or are you being treated for high blood pressure? 1  BMI more than 35 kg/m2? 0  Age over 79 years old? 1  Neck circumference greater than 40 cm/16 inches? 1  Gender: 1  Obstructive Sleep Apnea Score 4  Score 4 or greater  Results sent to PCP  Has a PCP down in [facility]-- Dr. Carolynn Sayers

## 2014-08-25 ENCOUNTER — Other Ambulatory Visit: Payer: Self-pay | Admitting: Orthopedic Surgery

## 2014-08-25 NOTE — Progress Notes (Signed)
Anesthesia Chart Review:  Pt is 81 year old male scheduled for L total hip arthroplasty on 09/01/2014 with Dr. Mayer Camel.   PMH includes: CAD (06/2013 VF arrest/cath: 3 vessel disease, EF 50%), CVA, AAA, HTN, hyperlipidemia. S/p CABG 06/2013 (LIMA-> LAD, VG-> PDA->RPL, VG->OM). S/p percutaneous EVAR of AAA 05/2014. BMI 31  Medications include: Plavix. Juliann Pulse from Dr. Damita Dunnings office will call and notify pt to stop plavix 08/25/2014.   Preoperative labs reviewed.  H/H 9.9/31.9. Notified Juliann Pulse in Dr. Damita Dunnings office.   Chest x-ray reviewed. No acute cardiopulmonary disease.   EKG: NSR. Cannot rule out Anterior infarct , age undetermined. T wave abnormality, consider inferolateral ischemia.  No significant change since last tracing  Pt has cardiac clearance in Epic note dated 07/04/2014 from Murray Hodgkins, NP.   Pt has clearance from vascular surgeon in [facility], Dr. Joycelyn Das on paper chart.   Per Juliann Pulse in Dr. Damita Dunnings office, 2 units of blood will be ordered for pt DOS.   Discussed with Dr. Conrad Clarendon.   If no changes, I anticipate pt can proceed with surgery as scheduled.   Willeen Cass, FNP-BC [facility]/Anesthesiology Phone: [phone] 08/25/2014 4:28 PM

## 2014-08-29 NOTE — H&P (Signed)
TOTAL HIP ADMISSION H&P  Patient is admitted for left total hip arthroplasty.  Subjective:  Chief Complaint: left hip pain  HPI: Tyler [name], 81 y.o. male, has a history of pain and functional disability in the left hip(s) due to arthritis and patient has failed non-surgical conservative treatments for greater than 12 weeks to include NSAID's and/or analgesics, flexibility and strengthening excercises, supervised PT with diminished ADL's post treatment, use of assistive devices and activity modification.  Onset of symptoms was gradual starting 10 years ago with gradually worsening course since that time.The patient noted no past surgery on the left hip(s).  Patient currently rates pain in the left hip at 10 out of 10 with activity. Patient has night pain, worsening of pain with activity and weight bearing, trendelenberg gait, pain that interfers with activities of daily living and pain with passive range of motion. Patient has evidence of periarticular osteophytes and joint space narrowing by imaging studies. This condition presents safety issues increasing the risk of falls.  There is no current active infection.  Patient Active Problem List   Diagnosis Date Noted  . AAA (abdominal aortic aneurysm)   . Essential hypertension   . CAD (coronary artery disease)   . Cellulitis 07/08/2013  . S/P CABG x 4 06/27/2013  . Exertional 06/24/2013    Class: Acute  . Coronary atherosclerosis   . Hypertension   . Hyperlipidemia   . CVA (cerebral infarction)    Past Medical History  Diagnosis Date  . CAD (coronary artery disease)     a. 06/2013 VF Arrest/Cath: 3VD, EF 50%;  b. 06/2013 CABG x 4: LIMA->LAD, VG->PDA->RPL, VG->OM.  [name] Osteoarthritis   . Essential hypertension   . Hyperlipidemia   . Erectile dysfunction   . CVA (cerebral infarction)   . GERD (gastroesophageal reflux disease)   . H/O hiatal hernia   . AAA (abdominal aortic aneurysm)     a. 05/2014 s/p stent grafting [facility]).   . Stroke   . HOH (hard of hearing)     wears hearing aid in right ear  . Bronchitis   . Pneumonia     YRS AGO    Past Surgical History  Procedure Laterality Date  . Coronary artery bypass graft N/A 06/27/2013    Procedure: CORONARY ARTERY BYPASS GRAFTING (CABG);  Surgeon: Ivin Poot, MD;  Location: [place];  Service: Open Heart Surgery;  Laterality: N/A;  times four utilizing the left internal mammary artery and the right greater saphenous vein harvested endoscopically  . Tee without cardioversion N/A 06/27/2013    Procedure: TRANSESOPHAGEAL ECHOCARDIOGRAM (TEE);  Surgeon: Ivin Poot, MD;  Location: [place];  Service: Open Heart Surgery;  Laterality: N/A;  . Endovein harvest of greater saphenous vein Right 06/27/2013    Procedure: ENDOVEIN HARVEST OF GREATER SAPHENOUS VEIN;  Surgeon: Ivin Poot, MD;  Location: [place];  Service: Open Heart Surgery;  Laterality: Right;  . Inner ear surgery Left 1982  . Left heart catheterization with coronary angiogram N/A 06/27/2013    Procedure: LEFT HEART CATHETERIZATION WITH CORONARY ANGIOGRAM;  Surgeon: Sinclair Grooms, MD;  Location: [facility] CATH LAB;  Service: Cardiovascular;  Laterality: N/A;  iabp insertion, cpr, intubation, emergent cvts  . Tonsillectomy    . Inner ear surgery      severed a nerve in his left ear.   . Eye surgery      bilateral cataracts    No prescriptions prior to admission   No Known Allergies  History  Substance Use Topics  . Smoking status: Never Smoker   . Smokeless tobacco: Never Used  . Alcohol Use: Yes     Comment: Rare    Family History  Problem Relation Age of Onset  . Heart disease Sister   . Heart disease Brother      Review of Systems  Constitutional: Positive for malaise/fatigue.  HENT: Positive for hearing loss.   Eyes: Positive for blurred vision.  Gastrointestinal: Positive for constipation.  Genitourinary: Positive for urgency.       Poor bladder control  Musculoskeletal: Positive for  joint pain.  Neurological: Positive for tremors.  Endo/Heme/Allergies: Bruises/bleeds easily.    Objective:  Physical Exam  Constitutional: He is oriented to person, place, and time. He appears well-developed and well-nourished.  HENT:  Head: Normocephalic and atraumatic.  Eyes: Pupils are equal, round, and reactive to light.  Neck: Normal range of motion. Neck supple.  Cardiovascular: Intact distal pulses.   Respiratory: Effort normal.  Musculoskeletal: He exhibits tenderness.  the patient has obvious stiffness in his left hip.  Obvious pain with internal rotation.  He has internal rotation to 0.  Patient has moderate stiffness in the right hip and can internally rotate to approximately 15.  Obvious irritability bilaterally.  He has brisk capillary refill and is neurovascularly intact distally.  Neurological: He is alert and oriented to person, place, and time.  Skin: Skin is warm and dry.  Psychiatric: He has a normal mood and affect. His behavior is normal. Judgment and thought content normal.    Vital signs in last 24 hours:    Labs:   Estimated body mass index is 32.70 kg/(m^2) as calculated from the following:   Height as of 07/04/14: 5\' 8"  (1.727 m).   Weight as of 07/04/14: 97.523 kg (215 lb).   Imaging Review Plain radiographs demonstrate AP of the pelvis and one view of the left hip are taken and reviewed in office today.  Patient does have end-stage bone-on-bone arthritis with obvious spurring of the left hip.  Assessment/Plan:  End stage arthritis, left hip(s)  The patient history, physical examination, clinical judgement of the provider and imaging studies are consistent with end stage degenerative joint disease of the left hip(s) and total hip arthroplasty is deemed medically necessary. The treatment options including medical management, injection therapy, arthroscopy and arthroplasty were discussed at length. The risks and benefits of total hip arthroplasty  were presented and reviewed. The risks due to aseptic loosening, infection, stiffness, dislocation/subluxation,  thromboembolic complications and other imponderables were discussed.  The patient acknowledged the explanation, agreed to proceed with the plan and consent was signed. Patient is being admitted for inpatient treatment for surgery, pain control, PT, OT, prophylactic antibiotics, VTE prophylaxis, progressive ambulation and ADL's and discharge planning.The patient is planning to be discharged home with home health services, though the pt does have a bed reserved at SNF.

## 2014-08-29 NOTE — Progress Notes (Signed)
Spoke with patient's wife and instructed for patient to arrive 700 am Monday  09/01/14.

## 2014-08-31 MED ORDER — CEFAZOLIN SODIUM-DEXTROSE 2-3 GM-% IV SOLR
2.0000 g | INTRAVENOUS | Status: AC
  Administered 2014-09-01: 2 g via INTRAVENOUS
  Filled 2014-08-31: qty 50

## 2014-08-31 MED ORDER — DEXTROSE-NACL 5-0.45 % IV SOLN: INTRAVENOUS | Status: DC

## 2014-09-01 ENCOUNTER — Encounter (HOSPITAL_COMMUNITY): Payer: Self-pay | Admitting: *Deleted

## 2014-09-01 ENCOUNTER — Encounter (HOSPITAL_COMMUNITY)
Admission: RE | Disposition: A | Discharge status: Skilled Nursing Facility | Payer: Self-pay | Source: Ambulatory Visit | Attending: Orthopedic Surgery

## 2014-09-01 ENCOUNTER — Inpatient Hospital Stay (HOSPITAL_COMMUNITY)
Admission: RE | Admit: 2014-09-01 | Discharge: 2014-09-05 | DRG: 470 | Disposition: A | Discharge status: Skilled Nursing Facility | Payer: Medicare Other | Source: Ambulatory Visit | Attending: Orthopedic Surgery | Admitting: Orthopedic Surgery

## 2014-09-01 ENCOUNTER — Inpatient Hospital Stay (HOSPITAL_COMMUNITY): Payer: Medicare Other | Admitting: Certified Registered Nurse Anesthetist

## 2014-09-01 ENCOUNTER — Inpatient Hospital Stay (HOSPITAL_COMMUNITY): Payer: Medicare Other | Admitting: Emergency Medicine

## 2014-09-01 DIAGNOSIS — K219 Gastro-esophageal reflux disease without esophagitis: Secondary | ICD-10-CM | POA: Diagnosis present

## 2014-09-01 DIAGNOSIS — K913 Postprocedural intestinal obstruction: Secondary | ICD-10-CM | POA: Diagnosis not present

## 2014-09-01 DIAGNOSIS — I10 Essential (primary) hypertension: Secondary | ICD-10-CM | POA: Diagnosis present

## 2014-09-01 DIAGNOSIS — Z8249 Family history of ischemic heart disease and other diseases of the circulatory system: Secondary | ICD-10-CM

## 2014-09-01 DIAGNOSIS — Z7982 Long term (current) use of aspirin: Secondary | ICD-10-CM

## 2014-09-01 DIAGNOSIS — Z79899 Other long term (current) drug therapy: Secondary | ICD-10-CM

## 2014-09-01 DIAGNOSIS — E785 Hyperlipidemia, unspecified: Secondary | ICD-10-CM | POA: Diagnosis present

## 2014-09-01 DIAGNOSIS — M25552 Pain in left hip: Secondary | ICD-10-CM | POA: Diagnosis present

## 2014-09-01 DIAGNOSIS — H9190 Unspecified hearing loss, unspecified ear: Secondary | ICD-10-CM | POA: Diagnosis present

## 2014-09-01 DIAGNOSIS — Z7902 Long term (current) use of antithrombotics/antiplatelets: Secondary | ICD-10-CM | POA: Diagnosis not present

## 2014-09-01 DIAGNOSIS — Z951 Presence of aortocoronary bypass graft: Secondary | ICD-10-CM | POA: Diagnosis not present

## 2014-09-01 DIAGNOSIS — I2510 Atherosclerotic heart disease of native coronary artery without angina pectoris: Secondary | ICD-10-CM | POA: Diagnosis present

## 2014-09-01 DIAGNOSIS — M1612 Unilateral primary osteoarthritis, left hip: Secondary | ICD-10-CM | POA: Diagnosis present

## 2014-09-01 DIAGNOSIS — D62 Acute posthemorrhagic anemia: Secondary | ICD-10-CM | POA: Diagnosis not present

## 2014-09-01 DIAGNOSIS — Z8673 Personal history of transient ischemic attack (TIA), and cerebral infarction without residual deficits: Secondary | ICD-10-CM | POA: Diagnosis not present

## 2014-09-01 DIAGNOSIS — I714 Abdominal aortic aneurysm, without rupture, unspecified: Secondary | ICD-10-CM | POA: Diagnosis present

## 2014-09-01 DIAGNOSIS — R109 Unspecified abdominal pain: Secondary | ICD-10-CM

## 2014-09-01 DIAGNOSIS — R140 Abdominal distension (gaseous): Secondary | ICD-10-CM

## 2014-09-01 HISTORY — PX: TOTAL HIP ARTHROPLASTY: SHX124

## 2014-09-01 LAB — PREPARE RBC (CROSSMATCH)

## 2014-09-01 SURGERY — ARTHROPLASTY, HIP, TOTAL,POSTERIOR APPROACH
Anesthesia: General | Site: Hip | Laterality: Left

## 2014-09-01 MED ORDER — ASPIRIN EC 325 MG PO TBEC
325.0000 mg | DELAYED_RELEASE_TABLET | Freq: Every day | ORAL | Status: DC
  Administered 2014-09-02 – 2014-09-05 (×4): 325 mg via ORAL
  Filled 2014-09-01 (×5): qty 1

## 2014-09-01 MED ORDER — ATORVASTATIN CALCIUM 10 MG PO TABS: 10.0000 mg | ORAL_TABLET | Freq: Every day | ORAL | Status: DC

## 2014-09-01 MED ORDER — PHENYLEPHRINE HCL 10 MG/ML IJ SOLN
10.0000 mg | INTRAVENOUS | Status: DC | PRN
  Administered 2014-09-01: 25 ug/min via INTRAVENOUS

## 2014-09-01 MED ORDER — NEOSTIGMINE METHYLSULFATE 10 MG/10ML IV SOLN
INTRAVENOUS | Status: DC | PRN
  Administered 2014-09-01: 3 mg via INTRAVENOUS

## 2014-09-01 MED ORDER — ACETAMINOPHEN 325 MG PO TABS: 650.0000 mg | ORAL_TABLET | Freq: Four times a day (QID) | ORAL | Status: DC | PRN

## 2014-09-01 MED ORDER — ONDANSETRON HCL 4 MG/2ML IJ SOLN
INTRAMUSCULAR | Status: DC | PRN
  Administered 2014-09-01: 4 mg via INTRAVENOUS

## 2014-09-01 MED ORDER — GLYCOPYRROLATE 0.2 MG/ML IJ SOLN
INTRAMUSCULAR | Status: AC
  Filled 2014-09-01: qty 3

## 2014-09-01 MED ORDER — SODIUM CHLORIDE 0.9 % IV SOLN
Freq: Once | INTRAVENOUS | Status: DC
Start: 2014-09-01 — End: 2014-09-01

## 2014-09-01 MED ORDER — NITROGLYCERIN 0.4 MG SL SUBL: 0.4000 mg | SUBLINGUAL_TABLET | SUBLINGUAL | Status: DC | PRN

## 2014-09-01 MED ORDER — MAGNESIUM CITRATE PO SOLN: 1.0000 | Freq: Once | ORAL | Status: AC | PRN

## 2014-09-01 MED ORDER — ROCURONIUM BROMIDE 100 MG/10ML IV SOLN
INTRAVENOUS | Status: DC | PRN
  Administered 2014-09-01: 10 mg via INTRAVENOUS
  Administered 2014-09-01: 30 mg via INTRAVENOUS

## 2014-09-01 MED ORDER — ONDANSETRON HCL 4 MG/2ML IJ SOLN: 4.0000 mg | Freq: Once | INTRAMUSCULAR | Status: DC | PRN

## 2014-09-01 MED ORDER — CLOPIDOGREL BISULFATE 75 MG PO TABS
75.0000 mg | ORAL_TABLET | Freq: Every day | ORAL | Status: DC
  Administered 2014-09-02 – 2014-09-05 (×4): 75 mg via ORAL
  Filled 2014-09-01 (×5): qty 1

## 2014-09-01 MED ORDER — LIDOCAINE HCL (CARDIAC) 20 MG/ML IV SOLN
INTRAVENOUS | Status: AC
  Filled 2014-09-01: qty 5

## 2014-09-01 MED ORDER — TRANEXAMIC ACID 100 MG/ML IV SOLN
2000.0000 mg | INTRAVENOUS | Status: DC | PRN
  Administered 2014-09-01: 2000 mg via TOPICAL

## 2014-09-01 MED ORDER — ONDANSETRON HCL 4 MG PO TABS: 4.0000 mg | ORAL_TABLET | Freq: Four times a day (QID) | ORAL | Status: DC | PRN

## 2014-09-01 MED ORDER — LIDOCAINE HCL (CARDIAC) 20 MG/ML IV SOLN
INTRAVENOUS | Status: DC | PRN
  Administered 2014-09-01: 60 mg via INTRAVENOUS

## 2014-09-01 MED ORDER — TIZANIDINE HCL 2 MG PO CAPS: 2.0000 mg | ORAL_CAPSULE | Freq: Three times a day (TID) | ORAL | Status: DC

## 2014-09-01 MED ORDER — METHOCARBAMOL 1000 MG/10ML IJ SOLN
500.0000 mg | Freq: Four times a day (QID) | INTRAVENOUS | Status: DC | PRN
  Filled 2014-09-01: qty 5

## 2014-09-01 MED ORDER — GLYCOPYRROLATE 0.2 MG/ML IJ SOLN
INTRAMUSCULAR | Status: DC | PRN
  Administered 2014-09-01: 0.4 mg via INTRAVENOUS

## 2014-09-01 MED ORDER — SENNOSIDES-DOCUSATE SODIUM 8.6-50 MG PO TABS: 1.0000 | ORAL_TABLET | Freq: Every evening | ORAL | Status: DC | PRN

## 2014-09-01 MED ORDER — EPHEDRINE SULFATE 50 MG/ML IJ SOLN
INTRAMUSCULAR | Status: DC | PRN
  Administered 2014-09-01: 15 mg via INTRAVENOUS
  Administered 2014-09-01: 10 mg via INTRAVENOUS
  Administered 2014-09-01: 5 mg via INTRAVENOUS

## 2014-09-01 MED ORDER — BUPIVACAINE-EPINEPHRINE (PF) 0.5% -1:200000 IJ SOLN
INTRAMUSCULAR | Status: AC
  Filled 2014-09-01: qty 30

## 2014-09-01 MED ORDER — OXYCODONE HCL 5 MG PO TABS
5.0000 mg | ORAL_TABLET | ORAL | Status: DC | PRN
  Administered 2014-09-01 – 2014-09-03 (×7): 10 mg via ORAL
  Filled 2014-09-01: qty 1
  Filled 2014-09-01 (×6): qty 2

## 2014-09-01 MED ORDER — DIPHENHYDRAMINE HCL 12.5 MG/5ML PO ELIX: 12.5000 mg | ORAL_SOLUTION | ORAL | Status: DC | PRN

## 2014-09-01 MED ORDER — EPHEDRINE SULFATE 50 MG/ML IJ SOLN
INTRAMUSCULAR | Status: AC
  Filled 2014-09-01: qty 1

## 2014-09-01 MED ORDER — KCL IN DEXTROSE-NACL 20-5-0.45 MEQ/L-%-% IV SOLN
INTRAVENOUS | Status: DC
  Administered 2014-09-01 – 2014-09-04 (×4): via INTRAVENOUS
  Filled 2014-09-01 (×13): qty 1000

## 2014-09-01 MED ORDER — METHOCARBAMOL 1000 MG/10ML IJ SOLN
500.0000 mg | INTRAVENOUS | Status: DC
  Filled 2014-09-01: qty 5

## 2014-09-01 MED ORDER — STERILE WATER FOR INJECTION IJ SOLN
INTRAMUSCULAR | Status: AC
  Filled 2014-09-01: qty 10

## 2014-09-01 MED ORDER — METHOCARBAMOL 500 MG PO TABS
500.0000 mg | ORAL_TABLET | Freq: Four times a day (QID) | ORAL | Status: DC | PRN
  Administered 2014-09-01 – 2014-09-04 (×4): 500 mg via ORAL
  Filled 2014-09-01 (×4): qty 1

## 2014-09-01 MED ORDER — ROCURONIUM BROMIDE 50 MG/5ML IV SOLN
INTRAVENOUS | Status: AC
  Filled 2014-09-01: qty 1

## 2014-09-01 MED ORDER — PROPOFOL 10 MG/ML IV BOLUS
INTRAVENOUS | Status: DC | PRN
  Administered 2014-09-01: 130 mg via INTRAVENOUS

## 2014-09-01 MED ORDER — PROPOFOL 10 MG/ML IV BOLUS
INTRAVENOUS | Status: AC
  Filled 2014-09-01: qty 20

## 2014-09-01 MED ORDER — DOCUSATE SODIUM 100 MG PO CAPS
100.0000 mg | ORAL_CAPSULE | Freq: Two times a day (BID) | ORAL | Status: DC
  Administered 2014-09-01 – 2014-09-05 (×8): 100 mg via ORAL
  Filled 2014-09-01 (×11): qty 1

## 2014-09-01 MED ORDER — CHLORHEXIDINE GLUCONATE 4 % EX LIQD: 60.0000 mL | Freq: Once | CUTANEOUS | Status: DC

## 2014-09-01 MED ORDER — ONDANSETRON HCL 4 MG/2ML IJ SOLN: 4.0000 mg | Freq: Four times a day (QID) | INTRAMUSCULAR | Status: DC | PRN

## 2014-09-01 MED ORDER — FENTANYL CITRATE 0.05 MG/ML IJ SOLN
INTRAMUSCULAR | Status: AC
  Filled 2014-09-01: qty 5

## 2014-09-01 MED ORDER — PHENYLEPHRINE 40 MCG/ML (10ML) SYRINGE FOR IV PUSH (FOR BLOOD PRESSURE SUPPORT)
PREFILLED_SYRINGE | INTRAVENOUS | Status: AC
  Filled 2014-09-01: qty 10

## 2014-09-01 MED ORDER — METOPROLOL TARTRATE 12.5 MG HALF TABLET
12.5000 mg | ORAL_TABLET | Freq: Two times a day (BID) | ORAL | Status: DC
  Administered 2014-09-01 – 2014-09-05 (×8): 12.5 mg via ORAL
  Filled 2014-09-01 (×12): qty 1

## 2014-09-01 MED ORDER — FENTANYL CITRATE 0.05 MG/ML IJ SOLN
INTRAMUSCULAR | Status: DC | PRN
  Administered 2014-09-01 (×2): 50 ug via INTRAVENOUS
  Administered 2014-09-01: 100 ug via INTRAVENOUS

## 2014-09-01 MED ORDER — FENTANYL CITRATE 0.05 MG/ML IJ SOLN: 25.0000 ug | INTRAMUSCULAR | Status: DC | PRN

## 2014-09-01 MED ORDER — CEFUROXIME SODIUM 1.5 G IJ SOLR
INTRAMUSCULAR | Status: AC
  Filled 2014-09-01: qty 1.5

## 2014-09-01 MED ORDER — MENTHOL 3 MG MT LOZG
1.0000 | LOZENGE | OROMUCOSAL | Status: DC | PRN
  Filled 2014-09-01: qty 9

## 2014-09-01 MED ORDER — OXYCODONE-ACETAMINOPHEN 5-325 MG PO TABS: 1.0000 | ORAL_TABLET | ORAL | Status: DC | PRN

## 2014-09-01 MED ORDER — SODIUM CHLORIDE 0.9 % IR SOLN
Status: DC | PRN
  Administered 2014-09-01 (×2): 1000 mL

## 2014-09-01 MED ORDER — ACETAMINOPHEN 650 MG RE SUPP: 650.0000 mg | Freq: Four times a day (QID) | RECTAL | Status: DC | PRN

## 2014-09-01 MED ORDER — SUCCINYLCHOLINE CHLORIDE 20 MG/ML IJ SOLN
INTRAMUSCULAR | Status: AC
  Filled 2014-09-01: qty 1

## 2014-09-01 MED ORDER — ONDANSETRON HCL 4 MG/2ML IJ SOLN
INTRAMUSCULAR | Status: AC
  Filled 2014-09-01: qty 2

## 2014-09-01 MED ORDER — BUPIVACAINE-EPINEPHRINE 0.5% -1:200000 IJ SOLN
INTRAMUSCULAR | Status: DC | PRN
  Administered 2014-09-01: 18 mL

## 2014-09-01 MED ORDER — TRANEXAMIC ACID 100 MG/ML IV SOLN
2000.0000 mg | Freq: Once | INTRAVENOUS | Status: DC
  Filled 2014-09-01: qty 20

## 2014-09-01 MED ORDER — ARTIFICIAL TEARS OP OINT
TOPICAL_OINTMENT | OPHTHALMIC | Status: AC
  Filled 2014-09-01: qty 3.5

## 2014-09-01 MED ORDER — LACTATED RINGERS IV SOLN
INTRAVENOUS | Status: DC
  Administered 2014-09-01: 08:00:00 via INTRAVENOUS

## 2014-09-01 MED ORDER — METOCLOPRAMIDE HCL 10 MG PO TABS: 5.0000 mg | ORAL_TABLET | Freq: Three times a day (TID) | ORAL | Status: DC | PRN

## 2014-09-01 MED ORDER — NEOSTIGMINE METHYLSULFATE 10 MG/10ML IV SOLN
INTRAVENOUS | Status: AC
  Filled 2014-09-01: qty 1

## 2014-09-01 MED ORDER — METOCLOPRAMIDE HCL 5 MG/ML IJ SOLN: 5.0000 mg | Freq: Three times a day (TID) | INTRAMUSCULAR | Status: DC | PRN

## 2014-09-01 MED ORDER — PHENYLEPHRINE HCL 10 MG/ML IJ SOLN
INTRAMUSCULAR | Status: DC | PRN
  Administered 2014-09-01: 40 ug via INTRAVENOUS
  Administered 2014-09-01: 80 ug via INTRAVENOUS

## 2014-09-01 MED ORDER — ARTIFICIAL TEARS OP OINT
TOPICAL_OINTMENT | OPHTHALMIC | Status: DC | PRN
  Administered 2014-09-01: 1 via OPHTHALMIC

## 2014-09-01 MED ORDER — PHENYLEPHRINE HCL 10 MG/ML IJ SOLN
INTRAMUSCULAR | Status: AC
  Filled 2014-09-01: qty 1

## 2014-09-01 MED ORDER — LACTATED RINGERS IV SOLN
INTRAVENOUS | Status: DC | PRN
  Administered 2014-09-01 (×2): via INTRAVENOUS

## 2014-09-01 MED ORDER — SUCCINYLCHOLINE CHLORIDE 20 MG/ML IJ SOLN
INTRAMUSCULAR | Status: DC | PRN
  Administered 2014-09-01: 120 mg via INTRAVENOUS

## 2014-09-01 MED ORDER — HYDROMORPHONE HCL 1 MG/ML IJ SOLN
1.0000 mg | INTRAMUSCULAR | Status: DC | PRN
  Administered 2014-09-01 – 2014-09-02 (×3): 1 mg via INTRAVENOUS
  Filled 2014-09-01 (×4): qty 1

## 2014-09-01 MED ORDER — CEFUROXIME SODIUM 1.5 G IJ SOLR
INTRAMUSCULAR | Status: DC | PRN
  Administered 2014-09-01: 1.5 g

## 2014-09-01 MED ORDER — PHENOL 1.4 % MT LIQD: 1.0000 | OROMUCOSAL | Status: DC | PRN

## 2014-09-01 MED ORDER — BISACODYL 5 MG PO TBEC: 5.0000 mg | DELAYED_RELEASE_TABLET | Freq: Every day | ORAL | Status: DC | PRN

## 2014-09-01 MED ORDER — ASPIRIN EC 325 MG PO TBEC: 325.0000 mg | DELAYED_RELEASE_TABLET | Freq: Two times a day (BID) | ORAL | Status: DC

## 2014-09-01 SURGICAL SUPPLY — 58 items
BLADE SAW SGTL 18X1.27X75 (BLADE) ×2 IMPLANT
BLADE SAW SGTL 18X1.27X75MM (BLADE) ×1
BRUSH FEMORAL CANAL (MISCELLANEOUS) ×3 IMPLANT
CANISTER SUCTION 1500CC (MISCELLANEOUS) ×3 IMPLANT
CAPT HIP TOTAL 2 ×3 IMPLANT
CEMENT BONE DEPUY (Cement) ×6 IMPLANT
CEMENT RESTRICTOR DEPUY SZ 4 (Cement) ×3 IMPLANT
COVER BACK TABLE 24X17X13 BIG (DRAPES) ×3 IMPLANT
COVER SURGICAL LIGHT HANDLE (MISCELLANEOUS) ×6 IMPLANT
DRAPE IMP U-DRAPE 54X76 (DRAPES) ×3 IMPLANT
DRAPE ORTHO SPLIT 77X108 STRL (DRAPES) ×2
DRAPE PROXIMA HALF (DRAPES) ×3 IMPLANT
DRAPE SURG ORHT 6 SPLT 77X108 (DRAPES) ×1 IMPLANT
DRAPE U-SHAPE 47X51 STRL (DRAPES) ×3 IMPLANT
DRILL BIT 7/64X5 (BIT) ×3 IMPLANT
DRSG AQUACEL AG ADV 3.5X10 (GAUZE/BANDAGES/DRESSINGS) ×3 IMPLANT
DRSG AQUACEL AG ADV 3.5X14 (GAUZE/BANDAGES/DRESSINGS) ×3 IMPLANT
DURAPREP 26ML APPLICATOR (WOUND CARE) ×3 IMPLANT
ELECT BLADE 4.0 EZ CLEAN MEGAD (MISCELLANEOUS)
ELECT REM PT RETURN 9FT ADLT (ELECTROSURGICAL) ×3
ELECTRODE BLDE 4.0 EZ CLN MEGD (MISCELLANEOUS) IMPLANT
ELECTRODE REM PT RTRN 9FT ADLT (ELECTROSURGICAL) ×1 IMPLANT
GLOVE BIO SURGEON STRL SZ7.5 (GLOVE) ×3 IMPLANT
GLOVE BIO SURGEON STRL SZ8.5 (GLOVE) ×6 IMPLANT
GLOVE BIOGEL PI IND STRL 8 (GLOVE) ×2 IMPLANT
GLOVE BIOGEL PI IND STRL 9 (GLOVE) ×1 IMPLANT
GLOVE BIOGEL PI INDICATOR 8 (GLOVE) ×4
GLOVE BIOGEL PI INDICATOR 9 (GLOVE) ×2
GOWN STRL REUS W/ TWL LRG LVL3 (GOWN DISPOSABLE) ×2 IMPLANT
GOWN STRL REUS W/ TWL XL LVL3 (GOWN DISPOSABLE) ×3 IMPLANT
GOWN STRL REUS W/TWL LRG LVL3 (GOWN DISPOSABLE) ×4
GOWN STRL REUS W/TWL XL LVL3 (GOWN DISPOSABLE) ×6
HANDPIECE INTERPULSE COAX TIP (DISPOSABLE) ×2
HOOD PEEL AWAY FACE SHEILD DIS (HOOD) ×9 IMPLANT
KIT BASIN OR (CUSTOM PROCEDURE TRAY) ×3 IMPLANT
KIT ROOM TURNOVER OR (KITS) ×3 IMPLANT
MANIFOLD NEPTUNE II (INSTRUMENTS) ×3 IMPLANT
NEEDLE 22X1 1/2 (OR ONLY) (NEEDLE) ×3 IMPLANT
NS IRRIG 1000ML POUR BTL (IV SOLUTION) ×3 IMPLANT
PACK TOTAL JOINT (CUSTOM PROCEDURE TRAY) ×3 IMPLANT
PACK UNIVERSAL I (CUSTOM PROCEDURE TRAY) ×3 IMPLANT
PAD ARMBOARD 7.5X6 YLW CONV (MISCELLANEOUS) ×6 IMPLANT
PASSER SUT SWANSON 36MM LOOP (INSTRUMENTS) ×3 IMPLANT
PRESSURIZER FEMORAL UNIV (MISCELLANEOUS) ×3 IMPLANT
SET HNDPC FAN SPRY TIP SCT (DISPOSABLE) ×1 IMPLANT
SUT ETHIBOND 2 V 37 (SUTURE) ×3 IMPLANT
SUT VIC AB 0 CTB1 27 (SUTURE) ×3 IMPLANT
SUT VIC AB 1 CTX 36 (SUTURE) ×2
SUT VIC AB 1 CTX36XBRD ANBCTR (SUTURE) ×1 IMPLANT
SUT VIC AB 2-0 CTB1 (SUTURE) ×3 IMPLANT
SUT VIC AB 3-0 SH 27 (SUTURE) ×2
SUT VIC AB 3-0 SH 27X BRD (SUTURE) ×1 IMPLANT
SYR CONTROL 10ML LL (SYRINGE) ×3 IMPLANT
TOWEL OR 17X24 6PK STRL BLUE (TOWEL DISPOSABLE) ×3 IMPLANT
TOWEL OR 17X26 10 PK STRL BLUE (TOWEL DISPOSABLE) ×3 IMPLANT
TOWER CARTRIDGE SMART MIX (DISPOSABLE) ×6 IMPLANT
TRAY FOLEY CATH 14FR (SET/KITS/TRAYS/PACK) IMPLANT
WATER STERILE IRR 1000ML POUR (IV SOLUTION) ×12 IMPLANT

## 2014-09-01 NOTE — Transfer of Care (Signed)
Immediate Anesthesia Transfer of Care Note  Patient: Tyler [name]  Procedure(s) Performed: Procedure(s): TOTAL HIP ARTHROPLASTY (Left)  Patient Location: PACU  Anesthesia Type:General  Level of Consciousness: awake, alert , patient cooperative and responds to stimulation  Airway & Oxygen Therapy: Patient Spontanous Breathing and Patient connected to nasal cannula oxygen  Post-op Assessment: Report given to RN and Post -op Vital signs reviewed and stable  Post vital signs: Reviewed and stable  Last Vitals:  Filed Vitals:   09/01/14 0743  BP: 143/61  Pulse: 65  Temp: 28.7 C    Complications: No apparent anesthesia complications

## 2014-09-01 NOTE — Anesthesia Preprocedure Evaluation (Signed)
Anesthesia Evaluation  Patient identified by MRN, date of birth, ID band Patient awake    Reviewed: Allergy & Precautions, NPO status , Patient's Chart, lab work & pertinent test results  Airway Mallampati: II   Neck ROM: full    Dental   Pulmonary          Cardiovascular hypertension, + angina + CAD, + CABG and + Peripheral Vascular Disease     Neuro/Psych CVA    GI/Hepatic hiatal hernia, GERD-  ,  Endo/Other    Renal/GU      Musculoskeletal  (+) Arthritis -,   Abdominal   Peds  Hematology   Anesthesia Other Findings   Reproductive/Obstetrics                             Anesthesia Physical Anesthesia Plan  ASA: III  Anesthesia Plan: General   Post-op Pain Management:    Induction: Intravenous  Airway Management Planned: Oral ETT  Additional Equipment:   Intra-op Plan:   Post-operative Plan: Extubation in OR  Informed Consent: I have reviewed the patients History and Physical, chart, labs and discussed the procedure including the risks, benefits and alternatives for the proposed anesthesia with the patient or authorized representative who has indicated his/her understanding and acceptance.     Plan Discussed with: CRNA, Anesthesiologist and Surgeon  Anesthesia Plan Comments:         Anesthesia Quick Evaluation

## 2014-09-01 NOTE — Op Note (Signed)
PATIENT ID:      Tyler [name]  MRN:     [id number] DOB/AGE:    [birth year] / 81 y.o.       OPERATIVE REPORT    DATE OF PROCEDURE:  09/01/2014       PREOPERATIVE DIAGNOSIS:  LEFT HIP DEGENETIVE JOINT DISEASE                                                       Estimated body mass index is 30.85 kg/(m^2) as calculated from the following:   Height as of 08/22/14: 5\' 10"  (1.778 m).   Weight as of 07/04/14: 97.523 kg (215 lb).     POSTOPERATIVE DIAGNOSIS:  LEFT HIP DEGENETIVE JOINT DISEASE                                                           PROCEDURE:  L total hip arthroplasty using a 56 mm DePuy Pinnacle  Cup, [employer], 10-degree polyethylene liner index superior  and posterior, a +0 36 mm ceramic head, a #6 Summit basic stem, cemented double batch of DePuy HV cement with 1500 mg of Zinacef, 13 mm centralizer and #4 central canal occluder   SURGEON: Pepper Kerrick J    ASSISTANT:   Eric K. Barton Dubois  (present throughout entire procedure and necessary for timely completion of the procedure)  ANESTHESIA:  General  BLOOD LOSS: * No blood loss amount entered *  FLUID REPLACEMENT: 1800 crystalloid DRAINS: COMPLICATIONS:  None    INDICATIONS FOR PROCEDURE:Patient with end-stage arthritis of the L hip.  X-rays show bone-on-bone arthritic changes. Despite conservative measures with observation, anti-inflammatory medicine, narcotics, use of a cane, has severe unremitting pain and can ambulate only 3 blocks before resting.  Patient desires elective right total hip arthroplasty to decrease pain and increase function. The risks, benefits, and alternatives were discussed at length including but not limited to the risks of infection, bleeding, nerve injury, stiffness, blood clots, the need for revision surgery, cardiopulmonary complications, among others, and they were willing to proceed.Benefits have been discussed. Questions answered.     PROCEDURE IN DETAIL: The patient was  identified by armband,  received preoperative IV antibiotics in the holding area at [facility], taken to the operating room , appropriate anesthetic monitors  were attached and general endotracheal anesthesia induced. Foley catheter was inserted. Patient was rolled into the R lateral decubitus position and fixed there with a Stulberg Mark II pelvic clamp and the L lower extremity was then prepped and draped  in the usual sterile fashion from the ankle to the hemipelvis. A time-out  procedure was performed. The skin along the lateral hip and thigh  infiltrated with 10 mL of 0.5% Marcaine and epinephrine solution. We  then made a posterolateral approach to the hip. With a #10 blade, 15 cm  incision through skin and subcutaneous tissue down to the level of the  IT band. Small bleeders were identified and cauterized. IT band cut in  line with skin incision exposing the greater trochanter. A Cobra retractor was placed between the gluteus minimus and the superior hip joint capsule, and  a spiked Cobra between the quadratus femoris and the inferior hip joint capsule. This isolated the short  external rotators and piriformis tendons. These were tagged with a #2 Ethibond  suture and cut off their insertion on the intertrochanteric crest. The posterior  capsule was then developed into an acetabular-based flap from Posterior Superior off of the acetabulum out over the femoral neck and back posterior inferior to the acetabular rim. This flap was tagged with two #2 Ethibond sutures and retracted protecting the sciatic nerve. This exposed the arthritic femoral head and osteophytes. The hip was then flexed and internally rotated, dislocating the femoral head and a standard neck cut performed 1 fingerbreadth above the lesser trochanter.  A spiked Cobra was placed in the cotyloid notch and a Hohmann retractor was then used to lever the femur anteriorly off of the anterior pelvic column. A posterior-inferior wing  retractor was placed at the junction of the acetabulum and the ischium completing the acetabular exposure.We then removed the peripheral osteophytes and labrum from the acetabulum. We then reamed the acetabulum up to 55 mm with basket reamers obtaining good coverage in all quadrants, irrigated out with normal  saline solution and hammered into place a 56 mm pinnacle cup in 45  degrees of abduction and about 20 degrees of anteversion. More  peripheral osteophytes removed and a trial 10-degree liner placed with the  index superior-posterior. The hip was then flexed and internally rotated exposing the  proximal femur, which was entered with the initiating reamer followed by  the tapered reamers up to a 6 tapered reamer and broaching up to a #6 broach, which had good fit and fill. A trial reduction was then  performed with a +0  36-mm ball on the standard neck and  excellent stability was noted with at 90 of flexion with 60 of  internal rotation and then full extension withexternal rotation. The hip  could not be dislocated in full extension. The knee could easily flex  to about 140 degrees. We also stretched the abductors at this point,  because of the preexisting adductor contractures. All trial components  were then removed. The acetabulum was irrigated out with normal saline  solution. A titanium [facility] was then screwed into place  followed by a 10-degree polyethylene liner index superior-posterior. On  the femoral side, we then sized for a #4 cement restrictor and irrigated  out the femoral canal with normal saline solution and dried with suction  and sponges. The cement restrictor was placed to the appropriate depth. A  double batch of DePuy 1 cement with 1500 mg of Zinacef was mixed and  injected into the canal under pressure followed by #6 Summit basic stem  in 20 degrees of anteversion and as this was held in place, excess cement  was removed. At this point, a +0 36-mm  metal head was  hammered on the stem. The hip was reduced. We checked our stability  one more time and found to be excellent. The wound was once again  thoroughly irrigated out with normal saline solution pulse lavage. The  capsular flap and short external rotators were repaired back to the  intertrochanteric crest through drill holes with a #2 Ethibond suture.  The IT band was closed with running 1 Vicryl suture. The subcutaneous  tissue with 0 and 2-0 undyed Vicryl suture and the skin with running  interlocking 3-0 nylon suture. Dressing of Xeroform and Mepilex was  then applied. The patient was then unclamped, rolled  supine, awaken extubated and taken to recovery room without difficulty in stable condition.   Geraldine Sandberg J 09/01/2014, 10:16 AM

## 2014-09-01 NOTE — Evaluation (Signed)
Physical Therapy Evaluation Patient Details Name: Tyler [name] MRN: [id number] DOB: [birth year] Today's Date: 09/01/2014   History of Present Illness  Patient is an 81 yo male admitted 09/01/14 now s/p Lt THA with posterior precautions  PMH:  AAA with stent, HTN, CAD, CABG, HLD, CVA, OA, HOH.  Clinical Impression  Patient presents with problems listed below.  Will benefit from acute PT to maximize independence prior to discharge.  Recommend ST-SNF at discharge for continued therapy.  (If patient progresses to Mod I level, may be able to return home with HHPT.)    Follow Up Recommendations SNF;Supervision/Assistance - 24 hour    Equipment Recommendations  None recommended by PT    Recommendations for Other Services       Precautions / Restrictions Precautions Precautions: Posterior Hip;Fall Precaution Booklet Issued: Yes (comment) Precaution Comments: Reviewed hip precautions with patient and daughter and provided handout. Restrictions Weight Bearing Restrictions: Yes LLE Weight Bearing: Weight bearing as tolerated      Mobility  Bed Mobility Overal bed mobility: Needs Assistance;+2 for physical assistance Bed Mobility: Supine to Sit     Supine to sit: Max assist;+2 for physical assistance     General bed mobility comments: Verbal cues for technique.  Patient required max assist to move LE's off of bed and to raise trunk to sitting position - used bed pads to assist patient.  Min assist for sitting balance initially.  Transfers Overall transfer level: Needs assistance Equipment used: Rolling walker (2 wheeled) Transfers: Sit to/from [facility] Sit to Stand: Mod assist;+2 physical assistance Stand pivot transfers: Min assist;+2 safety/equipment       General transfer comment: Verbal cues for hand placement and technique.  Assist to power up to standing and for balance.  Patient attempted to void in standing - very small amount.  Patient then able to take  several shuffle steps to pivot to chair.  Again attempted to void with minimal success.  Assist to control descent into chair.  Ambulation/Gait                Stairs            Wheelchair Mobility    Modified Rankin (Stroke Patients Only)       Balance Overall balance assessment: Needs assistance Sitting-balance support: Single extremity supported Sitting balance-Leahy Scale: Poor Sitting balance - Comments: Patient leaning posteriorly requiring min assist for balance Postural control: Posterior lean Standing balance support: Bilateral upper extremity supported Standing balance-Leahy Scale: Poor                               Pertinent Vitals/Pain Pain Assessment: 0-10 Pain Score: 4  Pain Location: Lt hip Pain Descriptors / Indicators: Aching Pain Intervention(s): Limited activity within patient's tolerance;Repositioned;Patient requesting pain meds-RN notified    Home Living Family/patient expects to be discharged to:: Skilled nursing facility Living Arrangements: Spouse/significant other Available Help at Discharge: Family (wife unable to provide physical assist) Type of Home: House Home Access: Stairs to enter Entrance Stairs-Rails: None Entrance Stairs-Number of Steps: 3 Home Layout: Two level;Able to live on main level with bedroom/bathroom Home Equipment: Gilford Rile - 2 wheels;Bedside commode      Prior Function Level of Independence: Independent               Hand Dominance        Extremity/Trunk Assessment   Upper Extremity Assessment: Overall WFL for tasks assessed  Lower Extremity Assessment: LLE deficits/detail   LLE Deficits / Details: Decreased strength and ROM post-op  Cervical / Trunk Assessment: Normal  Communication   Communication: HOH (Speak into Rt ear)  Cognition Arousal/Alertness: Awake/alert Behavior During Therapy: WFL for tasks assessed/performed Overall Cognitive Status: Within Functional  Limits for tasks assessed                      General Comments      Exercises Total Joint Exercises Ankle Circles/Pumps: AROM;Both;10 reps;Seated      Assessment/Plan    PT Assessment Patient needs continued PT services  PT Diagnosis Difficulty walking;Acute pain   PT Problem List Decreased strength;Decreased range of motion;Decreased activity tolerance;Decreased balance;Decreased mobility;Decreased knowledge of use of DME;Decreased knowledge of precautions;Pain  PT Treatment Interventions DME instruction;Gait training;Functional mobility training;Therapeutic activities;Therapeutic exercise;Patient/family education   PT Goals (Current goals can be found in the Care Plan section) Acute Rehab PT Goals Patient Stated Goal: To walk PT Goal Formulation: With patient/family Time For Goal Achievement: 09/08/14 Potential to Achieve Goals: Good    Frequency 7X/week   Barriers to discharge Decreased caregiver support Do not feel that wife can provide necessary assist for patient.    Co-evaluation               End of Session Equipment Utilized During Treatment: Gait belt;Oxygen;Left knee immobilizer Activity Tolerance: Patient limited by fatigue;Patient limited by pain Patient left: in chair;with call bell/phone within reach;with family/visitor present;with nursing/sitter in room Nurse Communication: Mobility status         Time: 9191-6606 PT Time Calculation (min) (ACUTE ONLY): 24 min   Charges:   PT Evaluation $Initial PT Evaluation Tier I: 1 Procedure PT Treatments $Therapeutic Activity: 8-22 mins   PT G Codes:        Despina Pole 09/30/14, 4:23 PM Carita Pian. Sanjuana Kava, [place] Pager [phone]

## 2014-09-01 NOTE — Progress Notes (Signed)
Utilization review completed.  

## 2014-09-01 NOTE — Anesthesia Postprocedure Evaluation (Signed)
Anesthesia Post Note  Patient: Tyler [name]  Procedure(s) Performed: Procedure(s) (LRB): TOTAL HIP ARTHROPLASTY (Left)  Anesthesia type: General  Patient location: PACU  Post pain: Pain level controlled and Adequate analgesia  Post assessment: Post-op Vital signs reviewed, Patient's Cardiovascular Status Stable, Respiratory Function Stable, Patent Airway and Pain level controlled  Last Vitals:  Filed Vitals:   09/01/14 1145  BP:   Pulse: 73  Temp:   Resp: 12    Post vital signs: Reviewed and stable  Level of consciousness: awake, alert  and oriented  Complications: No apparent anesthesia complications

## 2014-09-01 NOTE — Interval H&P Note (Signed)
History and Physical Interval Note:  09/01/2014 7:35 AM  Tyler [name]  has presented today for surgery, with the diagnosis of LEFT HIP [place]  The various methods of treatment have been discussed with the patient and family. After consideration of risks, benefits and other options for treatment, the patient has consented to  Procedure(s): TOTAL HIP ARTHROPLASTY (Left) as a surgical intervention .  The patient's history has been reviewed, patient examined, no change in status, stable for surgery.  I have reviewed the patient's chart and labs.  Questions were answered to the patient's satisfaction.     Kerin Salen

## 2014-09-02 ENCOUNTER — Encounter (HOSPITAL_COMMUNITY): Payer: Self-pay | Admitting: Orthopedic Surgery

## 2014-09-02 LAB — CBC
HEMATOCRIT: 26.3 % — AB (ref 39.0–52.0)
Hemoglobin: 8.1 g/dL — ABNORMAL LOW (ref 13.0–17.0)
MCH: 25.7 pg — ABNORMAL LOW (ref 26.0–34.0)
MCHC: 30.8 g/dL (ref 30.0–36.0)
MCV: 83.5 fL (ref 78.0–100.0)
Platelets: 154 10*3/uL (ref 150–400)
RBC: 3.15 MIL/uL — AB (ref 4.22–5.81)
RDW: 15.6 % — ABNORMAL HIGH (ref 11.5–15.5)
WBC: 7.6 10*3/uL (ref 4.0–10.5)

## 2014-09-02 LAB — BASIC METABOLIC PANEL
Anion gap: 9 (ref 5–15)
BUN: 18 mg/dL (ref 6–23)
CALCIUM: 8 mg/dL — AB (ref 8.4–10.5)
CO2: 21 mmol/L (ref 19–32)
Chloride: 106 mmol/L (ref 96–112)
Creatinine, Ser: 1.25 mg/dL (ref 0.50–1.35)
GFR calc Af Amer: 60 mL/min — ABNORMAL LOW (ref >90)
GFR, EST NON AFRICAN AMERICAN: 51 mL/min — AB (ref >90)
GLUCOSE: 144 mg/dL — AB (ref 70–99)
Potassium: 4.3 mmol/L (ref 3.5–5.1)
SODIUM: 136 mmol/L (ref 135–145)

## 2014-09-02 LAB — PREPARE RBC (CROSSMATCH)

## 2014-09-02 MED ORDER — SODIUM CHLORIDE 0.9 % IV SOLN: Freq: Once | INTRAVENOUS | Status: DC

## 2014-09-02 NOTE — Progress Notes (Signed)
Patient ID: Tyler [name], male   DOB: 25-Feb-1929, 81 y.o.   MRN: [id number] PATIENT ID: Tyler [name]  MRN: [id number]  DOB/AGE:  1929-06-03 / 81 y.o.  1 Day Post-Op Procedure(s) (LRB): TOTAL HIP ARTHROPLASTY (Left)    PROGRESS NOTE Subjective: Patient is alert, oriented, mild Nausea, no Vomiting, yes passing gas, no Bowel Movement. Taking PO well. Denies SOB, Chest or Calf Pain. Using Incentive Spirometer, PAS in place. Ambulate weightbearing as tolerated, patient felt dizzy when he got up yesterday his hemoglobin is 8.1 and he does have a history of heart disease status post four-vessel CABG and stroke in the past. Patient reports pain as 3 on 0-10 scale  .    Objective: Vital signs in last 24 hours: Filed Vitals:   09/01/14 1220 09/01/14 1310 09/01/14 1600 09/01/14 2220  BP: 121/60   118/50  Pulse: 80   80  Temp: 97.3 F (36.3 C)   97.3 F (36.3 C)  TempSrc:    Oral  Resp: 15  16   SpO2: 97% 95% 95% 95%      Intake/Output from previous day: I/O last 3 completed shifts: In: 2940 [P.O.:240; I.V.:2700] Out: 875 [Urine:675; Blood:200]   Intake/Output this shift:     LABORATORY DATA:  Recent Labs  09/02/14 0629  WBC 7.6  HGB 8.1*  HCT 26.3*  PLT 154  NA 136  K 4.3  CL 106  CO2 21  BUN 18  CREATININE 1.25  GLUCOSE 144*  CALCIUM 8.0*    Examination: Neurologically intact ABD soft Neurovascular intact Sensation intact distally Intact pulses distally Dorsiflexion/Plantar flexion intact Incision: scant drainage No cellulitis present Compartment soft} XR AP&Lat of hip shows well placed\fixed THA  Assessment:   1 Day Post-Op Procedure(s) (LRB): TOTAL HIP ARTHROPLASTY (Left) ADDITIONAL DIAGNOSIS:  Expected Acute Blood Loss Anemia, coronary arteries disease, history of abdominal aortic aneurysm, history of stroke, history of atherosclerosis.  Plan: PT/OT WBAT, THA  posterior precautions, will transfuse 1 unit of packed red blood cells to help with energy as  his hemoglobin is 8.1. He did start out with a hemoglobin of 9.9 so this is actually a relatively small drop after total joint. In addition patient would like to go home after 1 or 2 days and packed red blood cells may help with this as well.  DVT Prophylaxis: SCDx72 hrs, ASA 325 mg BID x 2 weeks  DISCHARGE PLAN: Home, will arrange rehabilitation stay if PT goes slowly  DISCHARGE NEEDS: HHPT, HHRN, Walker and 3-in-1 comode seat

## 2014-09-02 NOTE — Evaluation (Signed)
Occupational Therapy Evaluation Patient Details Name: Tyler [name] MRN: [id number] DOB: [birth year] Today's Date: 09/02/2014    History of Present Illness Patient is an 81 yo male admitted 09/01/14 now s/p Lt THA with posterior precautions  PMH:  AAA with stent, HTN, CAD, CABG, HLD, CVA, OA, HOH.   Clinical Impression   Patient independent PTA. Patient currently requires up to total assist for LB ADLs and mod assist for functional sit<>stands. Patient will benefit from acute OT to increase overall independence in the areas of ADLs, functional mobility, overall safety in order to safely discharge to venue listed below. According to wife, she is unable to provide any physical assistance at home.      Follow Up Recommendations  SNF;Supervision/Assistance - 24 hour    Equipment Recommendations  Tub/shower seat    Recommendations for Other Services  None at this time     Precautions / Restrictions Precautions Precautions: Posterior Hip;Fall Precaution Comments: Patient able to verbalize 3/3 hip precautions Restrictions Weight Bearing Restrictions: Yes LLE Weight Bearing: Weight bearing as tolerated      Mobility Bed Mobility General bed mobility comments: Patient seated in recliner upon entering room. Please see PT eval/treat for more information  Transfers Overall transfer level: Needs assistance Equipment used: Rolling walker (2 wheeled) Transfers: Sit to/from Stand Sit to Stand: Mod assist         General transfer comment: vc's required for hand placement and safe transitions    Balance Overall balance assessment: Needs assistance Sitting-balance support: No upper extremity supported;Feet supported Sitting balance-Leahy Scale: Fair   Postural control: Posterior lean Standing balance support: Bilateral upper extremity supported Standing balance-Leahy Scale: Poor     ADL Overall ADL's : Needs assistance/impaired Eating/Feeding: Independent   Grooming: Sitting;Set  up   Upper Body Bathing: Minimal assitance;Sitting   Lower Body Bathing: Total assistance;Sit to/from stand   Upper Body Dressing : Minimal assistance;Sitting   Lower Body Dressing: Total assistance;Sit to/from stand   Toilet Transfer: Moderate assistance;RW;BSC             General ADL Comments: Patient required mod assist for sit<>stand during all mobility. Patient with posterior lean and increased LOB during initial stand. Patient's wife states she is unable to provide any physical assistance at home. According to chart and thru discussion with pt and wife, pt is refusing SNF at this time. Believe patient is a good canidate for CIR. Patient unable to cross BLEs for LB ADLs. Will need AE to increase independence with this                Pertinent Vitals/Pain Pain Assessment: 0-10 Pain Score: 8  Pain Location: left hip Pain Descriptors / Indicators: Aching Pain Intervention(s): Monitored during session     Hand Dominance Right   Extremity/Trunk Assessment Upper Extremity Assessment Upper Extremity Assessment: Overall WFL for tasks assessed   Lower Extremity Assessment Lower Extremity Assessment: Defer to PT evaluation   Cervical / Trunk Assessment Cervical / Trunk Assessment: Normal   Communication Communication Communication: HOH (speak into right ear)   Cognition Arousal/Alertness: Awake/alert Behavior During Therapy: WFL for tasks assessed/performed Overall Cognitive Status: Within Functional Limits for tasks assessed              Home Living Family/patient expects to be discharged to:: Private residence Living Arrangements: Spouse/significant other Available Help at Discharge: Family (wife states she is unable to provide any physical assistance) Type of Home: House Home Access: Stairs to enter [employer] of Steps: 3  Entrance Stairs-Rails: None Home Layout: Two level;Able to live on main level with bedroom/bathroom     Bathroom  Shower/Tub: Walk-in shower;Door   [employer] Toilet: Standard     Home Equipment: Environmental consultant - 2 wheels;Bedside commode          Prior Functioning/Environment Level of Independence: Independent             OT Diagnosis: Generalized weakness;Acute pain   OT Problem List: Decreased strength;Decreased activity tolerance;Impaired balance (sitting and/or standing);Decreased coordination;Decreased safety awareness;Decreased knowledge of use of DME or AE;Decreased knowledge of precautions;Pain   OT Treatment/Interventions: Self-care/ADL training;Therapeutic exercise;Energy conservation;DME and/or AE instruction;Therapeutic activities;Patient/family education;Balance training    OT Goals(Current goals can be found in the care plan section) Acute Rehab OT Goals Patient Stated Goal: to go home OT Goal Formulation: With patient Time For Goal Achievement: 09/16/14 Potential to Achieve Goals: Good ADL Goals Pt Will Perform Grooming: with supervision;standing Pt Will Perform Lower Body Bathing: with supervision;with adaptive equipment;sit to/from stand Pt Will Perform Lower Body Dressing: with supervision;with adaptive equipment;sit to/from stand Pt Will Transfer to Toilet: with supervision;ambulating;bedside commode Pt Will Perform Toileting - Clothing Manipulation and hygiene: with supervision;sit to/from stand Pt Will Perform Tub/Shower Transfer: with supervision;Shower transfer;3 in 1;ambulating;rolling walker Additional ADL Goal #1: Patient will consistently perform sit<>stands with supervision 100% of the time in prep for more independent ADLs  OT Frequency: Min 2X/week   Barriers to D/C: Decreased caregiver support          End of Session Equipment Utilized During Treatment: Gait belt;Rolling walker  Activity Tolerance: Patient tolerated treatment well Patient left: in chair;with call bell/phone within reach;with family/visitor present   Time: 2035-5974 OT Time Calculation (min):  25 min Charges:  OT General Charges $OT Visit: 1 Procedure OT Evaluation $Initial OT Evaluation Tier I: 1 Procedure OT Treatments $Self Care/Home Management : 8-22 mins  Chaylee Ehrsam , MS, OTR/L, CLT Pager: [phone]  09/02/2014, 2:22 PM

## 2014-09-02 NOTE — Progress Notes (Signed)
Physical Therapy Treatment Patient Details Name: Tyler [name] MRN: [id number] DOB: 1929-01-16 Today's Date: 09/02/2014    History of Present Illness Patient is an 81 yo male admitted 09/01/14 now s/p Lt THA with posterior precautions  PMH:  AAA with stent, HTN, CAD, CABG, HLD, CVA, OA, HOH.    PT Comments    I spoke at length with OT re: concerns for going home.  During OT session OT spoke with wife who reports pt must be able to go to and from the bathroom unassisted before she can take him home.  He continues to require mod assist for both sit to stand transfers and gait. He continues to not be open to SNF level rehab at this time, but it is the most appropriate for him at discharge.  If wife is unable to assist him he is progressing too slowly right now to go home.  He will likely need at least min constant assist even if he stays one or two more days, not to mention more assist to get up his 3 stairs with no railings to get into the house.  I hoped to see more progress this PM, but pt is not progressing as quickly as anticipated and after speaking with OT, I agree that post acute rehab will be needed. PT will continue to follow acutely.   Follow Up Recommendations  SNF     Equipment Recommendations  None recommended by PT    Recommendations for Other Services   NA     Precautions / Restrictions Precautions Precautions: Posterior Hip;Fall Precaution Comments: Patient able to verbalize 3/3 hip precautions Restrictions Weight Bearing Restrictions: Yes LLE Weight Bearing: Weight bearing as tolerated    Mobility  Bed Mobility               General bed mobility comments: Patient seated in recliner upon entering room. Please see PT eval/treat for more information  Transfers Overall transfer level: Needs assistance Equipment used: Rolling walker (2 wheeled) Transfers: Sit to/from Stand Sit to Stand: Mod assist         General transfer comment: Mod assist to support trunk  during transitions to stand.  Pt has LOB initially in standing posteriorly and needed assist to prevent falling back into the chair. He also was unable to hold his own urinal to urinate in standing at chair due to the posterior lean. PT had to hold urinal and hold pt while verally telling him to lean forward so he doesn't fall backwards while he has both hands supported on RW.   Ambulation/Gait Ambulation/Gait assistance: Min assist;Mod assist Ambulation Distance (Feet): 100 Feet Assistive device: Rolling walker (2 wheeled) Gait Pattern/deviations: Step-to pattern;Antalgic;Trunk flexed Gait velocity: decreased Gait velocity interpretation: Below normal speed for age/gender General Gait Details: Pt continued to need multiple rest breaks, started out and finished as mod assist for gait due to weakness, pain, decreased balance, and decreased endurance.  Max verbal cues to stay inside of RW while walking and turning as well as upright posture.           Balance Overall balance assessment: Needs assistance Sitting-balance support: Feet supported;No upper extremity supported Sitting balance-Leahy Scale: Fair   Postural control: Posterior lean Standing balance support: No upper extremity supported;Bilateral upper extremity supported;Single extremity supported Standing balance-Leahy Scale: Poor                      Cognition Arousal/Alertness: Awake/alert Behavior During Therapy: WFL for tasks assessed/performed Overall Cognitive  Status: Within Functional Limits for tasks assessed                      Exercises Total Joint Exercises Ankle Circles/Pumps: AROM;Both;20 reps;Supine Quad Sets: AROM;Left;10 reps;Supine Short Arc Quad: AROM;Left;10 reps;Supine Heel Slides: AAROM;Left;10 reps;Supine Hip ABduction/ADduction: AAROM;Left;10 reps;Supine Long Arc Quad: AROM;Both;10 reps;Supine        Pertinent Vitals/Pain Pain Assessment: 0-10 Pain Score: 6  Pain Location:  left hip Pain Descriptors / Indicators: Aching;Burning Pain Intervention(s): Limited activity within patient's tolerance;Monitored during session;Repositioned    Home Living Family/patient expects to be discharged to:: Private residence Living Arrangements: Spouse/significant other Available Help at Discharge: Family (wife states she is unable to provide any physical assistance) Type of Home: House Home Access: Stairs to enter Entrance Stairs-Rails: None Home Layout: Two level;Able to live on main level with bedroom/bathroom Home Equipment: Gilford Rile - 2 wheels;Bedside commode      Prior Function Level of Independence: Independent          PT Goals (current goals can now be found in the care plan section) Acute Rehab PT Goals Patient Stated Goal: to go home Progress towards PT goals: Progressing toward goals (slowly)    Frequency  7X/week    PT Plan Discharge plan needs to be updated       End of Session   Activity Tolerance: Patient limited by fatigue;Patient limited by pain Patient left: in chair;with call bell/phone within reach;with nursing/sitter in room (RN tech in room changing bed)     Time: 0932-3557 PT Time Calculation (min) (ACUTE ONLY): 23 min  Charges:  $Gait Training: 8-22 mins $Therapeutic Exercise: 8-22 mins                      Tyler [name] B. [place], [place], DPT [phone]   09/02/2014, 4:34 PM

## 2014-09-02 NOTE — Progress Notes (Signed)
Physical Therapy Treatment Patient Details Name: Tyler [name] MRN: [id number] DOB: 11/01/1928 Today's Date: 09/02/2014    History of Present Illness Patient is an 81 yo male admitted 09/01/14 now s/p Lt THA with posterior precautions  PMH:  AAA with stent, HTN, CAD, CABG, HLD, CVA, OA, HOH.    PT Comments    Pt is POD #1 and is moving better, overall mod assist for transfers and gait with RW. He will need to progress to supervision level as there are reports that wife cannot provide physical assist.  PT will continue to follow acutely to try to progress gait and mobility so that he is safe to return home.   Follow Up Recommendations  Home health PT     Equipment Recommendations  None recommended by PT    Recommendations for Other Services   NA     Precautions / Restrictions Precautions Precautions: Posterior Hip;Fall Precaution Comments: verbally reviewed posterior hip precautions. Pt able to report 1/3 independently.  Restrictions Weight Bearing Restrictions: Yes LLE Weight Bearing: Weight bearing as tolerated    Mobility             Transfers Overall transfer level: Needs assistance Equipment used: Rolling walker (2 wheeled) Transfers: Sit to/from Stand Sit to Stand: Mod assist         General transfer comment: Mod assist to stand from lower recliner chair.  Verbal cues for safe hand placement and slow transition to sit.  Assist needed to boost up over weak and painful legs to stand.   Ambulation/Gait Ambulation/Gait assistance: Mod assist;Min assist Ambulation Distance (Feet): 100 Feet Assistive device: Rolling walker (2 wheeled) Gait Pattern/deviations: Step-to pattern;Antalgic;Trunk flexed Gait velocity: decreased   General Gait Details: Pt had to take 2-3 standing rest breaks during gait due to fatigue.  He needs significant verbal cues to stay inside of RW and stand tall during gait.  Pt started as mod assist initially, but was able to progress to min  assist towards the end of gait.  Chair to follow for safety due to low Hgb and blood just started running.  Pt asymptomatic despite anemia.              Balance Overall balance assessment: Needs assistance Sitting-balance support: No upper extremity supported;Feet supported Sitting balance-Leahy Scale: Good     Standing balance support: Bilateral upper extremity supported Standing balance-Leahy Scale: Poor                      Cognition Arousal/Alertness: Awake/alert Behavior During Therapy: WFL for tasks assessed/performed Overall Cognitive Status: Within Functional Limits for tasks assessed                      Exercises Total Joint Exercises Ankle Circles/Pumps: AROM;Both;20 reps;Supine Long Arc Quad: AROM;Both;10 reps;Seated        Pertinent Vitals/Pain Pain Assessment: 0-10 Pain Score: 6  Pain Location: left hip Pain Descriptors / Indicators: Aching;Burning Pain Intervention(s): Limited activity within patient's tolerance;Monitored during session;Repositioned           PT Goals (current goals can now be found in the care plan section) Acute Rehab PT Goals Patient Stated Goal: to go home, decrease pain, regain independence Progress towards PT goals: Progressing toward goals    Frequency  7X/week    PT Plan Discharge plan needs to be updated       End of Session   Activity Tolerance: Patient limited by pain Patient left: in chair;with  call bell/phone within reach;with family/visitor present     Time: 5686-1683 PT Time Calculation (min) (ACUTE ONLY): 28 min  Charges:  $Gait Training: 23-37 mins                     Deiona Hooper B. [place], [place], DPT [phone]   09/02/2014, 12:37 PM

## 2014-09-02 NOTE — Progress Notes (Signed)
Pt unable to void, In and out cath been done twice. First time got out 874ml of urine at 2248 and second time got out 656ml of urine at 0500. Will continue to monitor

## 2014-09-02 NOTE — Clinical Social Work Note (Signed)
CSW met with patient at bedside.  Patient is a bundle patient with MD office who was originally set up to dc to [facility].  CSW met with patient who is refusing SNF at this time and requesting to be dc'd home with home health.  RNCM from MD office made aware.  CSW remains available for assistance.  Nonnie Done, [place] [phone]  Psychiatric & Orthopedics (5N 1-16) Clinical Social Worker

## 2014-09-03 ENCOUNTER — Encounter (HOSPITAL_COMMUNITY): Payer: Self-pay | Admitting: Internal Medicine

## 2014-09-03 ENCOUNTER — Inpatient Hospital Stay (HOSPITAL_COMMUNITY): Payer: Medicare Other

## 2014-09-03 DIAGNOSIS — R1030 Lower abdominal pain, unspecified: Secondary | ICD-10-CM

## 2014-09-03 DIAGNOSIS — R109 Unspecified abdominal pain: Secondary | ICD-10-CM

## 2014-09-03 DIAGNOSIS — R140 Abdominal distension (gaseous): Secondary | ICD-10-CM

## 2014-09-03 DIAGNOSIS — I714 Abdominal aortic aneurysm, without rupture: Secondary | ICD-10-CM

## 2014-09-03 LAB — CBC
HEMATOCRIT: 26.6 % — AB (ref 39.0–52.0)
Hemoglobin: 8.4 g/dL — ABNORMAL LOW (ref 13.0–17.0)
MCH: 25.5 pg — ABNORMAL LOW (ref 26.0–34.0)
MCHC: 31.6 g/dL (ref 30.0–36.0)
MCV: 80.9 fL (ref 78.0–100.0)
PLATELETS: 161 10*3/uL (ref 150–400)
RBC: 3.29 MIL/uL — AB (ref 4.22–5.81)
RDW: 15.9 % — AB (ref 11.5–15.5)
WBC: 8.8 10*3/uL (ref 4.0–10.5)

## 2014-09-03 MED ORDER — ACETAMINOPHEN 500 MG PO TABS
1000.0000 mg | ORAL_TABLET | Freq: Three times a day (TID) | ORAL | Status: DC
  Administered 2014-09-03 – 2014-09-05 (×5): 1000 mg via ORAL
  Filled 2014-09-03 (×9): qty 2

## 2014-09-03 MED ORDER — KETOROLAC TROMETHAMINE 30 MG/ML IJ SOLN
30.0000 mg | Freq: Three times a day (TID) | INTRAMUSCULAR | Status: DC
Start: 2014-09-03 — End: 2014-09-04
  Administered 2014-09-03 – 2014-09-04 (×3): 30 mg via INTRAVENOUS
  Filled 2014-09-03 (×6): qty 1

## 2014-09-03 MED ORDER — OXYCODONE HCL 5 MG PO TABS
ORAL_TABLET | ORAL | Status: AC
  Filled 2014-09-03: qty 2

## 2014-09-03 MED ORDER — MAGNESIUM CITRATE PO SOLN
1.0000 | Freq: Once | ORAL | Status: AC
  Administered 2014-09-03: 1 via ORAL
  Filled 2014-09-03: qty 296

## 2014-09-03 MED ORDER — BISACODYL 10 MG RE SUPP
10.0000 mg | Freq: Every day | RECTAL | Status: DC | PRN
  Administered 2014-09-03: 10 mg via RECTAL
  Filled 2014-09-03: qty 1

## 2014-09-03 NOTE — Consult Note (Addendum)
[facility] Medical Consultation  Amber Guthridge DXA:[id number] DOB: June 06, 1929 DOA: 09/01/2014 PCP: PROVIDER NOT IN SYSTEM   Requesting physician: Joanell Rising Date of consultation: 09/03/2014 Reason for consultation: abdominal pain and distension  Impression/Recommendations Active Problems:   Arthritis of left hip  Abdominal pain and distension likely due to post-operative ileus.  No signs of severe obstruction or constipation on X-ray.  Passing gas now, however, so anticipate he may already be improving.   -  Change to clear liquid diet for now -  KUB demonstrates ileus, will keep electrolytes stable, consider NG to suction prn if needed, encouraged activity -  Minimize narcotics -  Start toradol and tylenol to help with alternative pain management  Arthritis s/p total arthroplasty left hip -  Per surgery  CAD, stable, continue aspirin, plavix -  Resume beta blocker and statin pending tolerating diet  CVA, continue aspirin and plavix  Post-operative anemia -  Transfuse for hgb < 7 or symptomatic anemia  I will followup again tomorrow. Please contact me if I can be of assistance in the meanwhile. Thank you for this consultation.  Chief Complaint: abdominal distension and pain  HPI:  The patient is an 81yo M with history of CAD s/p CBG, preserved EF, HTN, HLD, CVA, AAA s/p graft repair, HOH who presented for routine left total hip arthroplasty secondary to debilitating arthritis.  He underwent left total hip arthroplasty on 2/8.  Patient reports that for the last year he has had problems with constipation.  He takes stool softener and magnesium citrate 1-2 times per week.  He has hard BMs about every othery day with straining.  He denies blood, melena.  Last colonoscopy was many years ago, but denies history of polyps or cancer.  No history or family history of crohn's disease or UC.  He has been taking narcotics intermittently for his arthritis pain.  He states prior to  surgery, he was advised to use magnesium citrate and he had multiple BMs.  He felt "cleaned out" before surgery, however, post-operatively, he has had no BMs.  Last BM was 5 days ago.  He has developed worsening lower abdominal pain 10/10 sudden onset cramping that lasts for 30 seconds and then goes mostly away.  His abdominal pain is now much worse than his hip pain.  He is passing gas and hears gurgling from his stomach.  He denies nausea, but he has had poor appetite today and only drank a few sips of juice.  His surgeon has ordered him another dose of magnesium citrate and an enema and we are called to assist with management.    Review of Systems:  12-point ROS negative except as per HPI  Past Medical History  Diagnosis Date  . CAD (coronary artery disease)     a. 06/2013 VF Arrest/Cath: 3VD, EF 50%;  b. 06/2013 CABG x 4: LIMA->LAD, VG->PDA->RPL, VG->OM.  [name] Osteoarthritis   . Essential hypertension   . Hyperlipidemia   . Erectile dysfunction   . CVA (cerebral infarction)   . GERD (gastroesophageal reflux disease)   . H/O hiatal hernia   . AAA (abdominal aortic aneurysm)     a. 05/2014 s/p stent grafting [facility]).  . Stroke   . HOH (hard of hearing)     wears hearing aid in right ear  . Bronchitis   . Pneumonia     YRS AGO   Past Surgical History  Procedure Laterality Date  . Coronary artery bypass graft N/A 06/27/2013  Procedure: CORONARY ARTERY BYPASS GRAFTING (CABG);  Surgeon: Ivin Poot, MD;  Location: [place];  Service: Open Heart Surgery;  Laterality: N/A;  times four utilizing the left internal mammary artery and the right greater saphenous vein harvested endoscopically  . Tee without cardioversion N/A 06/27/2013    Procedure: TRANSESOPHAGEAL ECHOCARDIOGRAM (TEE);  Surgeon: Ivin Poot, MD;  Location: [place];  Service: Open Heart Surgery;  Laterality: N/A;  . Endovein harvest of greater saphenous vein Right 06/27/2013    Procedure: ENDOVEIN HARVEST OF GREATER  SAPHENOUS VEIN;  Surgeon: Ivin Poot, MD;  Location: [place];  Service: Open Heart Surgery;  Laterality: Right;  . Inner ear surgery Left 1982  . Left heart catheterization with coronary angiogram N/A 06/27/2013    Procedure: LEFT HEART CATHETERIZATION WITH CORONARY ANGIOGRAM;  Surgeon: Sinclair Grooms, MD;  Location: [facility] CATH LAB;  Service: Cardiovascular;  Laterality: N/A;  iabp insertion, cpr, intubation, emergent cvts  . Tonsillectomy    . Inner ear surgery      severed a nerve in his left ear.   . Eye surgery      bilateral cataracts  . Total hip arthroplasty Left 09/01/2014    Procedure: TOTAL HIP ARTHROPLASTY;  Surgeon: Kerin Salen, MD;  Location: [place];  Service: Orthopedics;  Laterality: Left;   Social History:  reports that he has never smoked. He has never used smokeless tobacco. He reports that he drinks alcohol. He reports that he does not use illicit drugs.  No Known Allergies Family History  Problem Relation Age of Onset  . Heart disease Sister   . Heart disease Brother   . Colon cancer Neg Hx     Prior to Admission medications   Medication Sig Start Date End Date Taking? Authorizing Provider  aspirin 81 MG tablet Take 81 mg by mouth daily.   Yes Historical Provider, MD  atorvastatin (LIPITOR) 10 MG tablet Take 10 mg by mouth daily.   Yes Historical Provider, MD  Cholecalciferol (VITAMIN D-3) 1000 UNITS CAPS Take 1 capsule by mouth daily.    Yes Historical Provider, MD  clopidogrel (PLAVIX) 75 MG tablet Take 1 tablet (75 mg total) by mouth daily with breakfast. 08/29/13  Yes Belva Crome III, MD  folic acid (FOLVITE) 998 MCG tablet Take 800 mcg by mouth daily.    Yes Historical Provider, MD  HYDROcodone-acetaminophen (NORCO/VICODIN) 5-325 MG per tablet Take 1 tablet by mouth every 6 (six) hours as needed for moderate pain.   Yes Historical Provider, MD  meloxicam (MOBIC) 15 MG tablet Take 15 mg by mouth daily.   Yes Historical Provider, MD  metoprolol tartrate  (LOPRESSOR) 25 MG tablet Take 0.5 tablets (12.5 mg total) by mouth 2 (two) times daily. 08/29/13  Yes Belva Crome III, MD  Multiple Vitamin (MULTIVITAMIN) tablet Take 1 tablet by mouth daily.   Yes Historical Provider, MD  nitroGLYCERIN (NITROSTAT) 0.4 MG SL tablet Place 1 tablet (0.4 mg total) under the tongue every 5 (five) minutes as needed for chest pain. 08/29/13  Yes Sinclair Grooms, MD  Omega-3 Fatty Acids (FISH OIL PO) Take 1,000 Units by mouth daily.    Yes Historical Provider, MD  aspirin EC 325 MG tablet Take 1 tablet (325 mg total) by mouth 2 (two) times daily. 09/01/14   Leighton Parody, PA-C  oxyCODONE (OXY IR/ROXICODONE) 5 MG immediate release tablet Take 1-2 tablets (5-10 mg total) by mouth every 4 (four) hours as needed  for severe pain. Patient not taking: Reported on 07/04/2014 07/12/13   Erin Barrett, PA-C  oxyCODONE-acetaminophen (ROXICET) 5-325 MG per tablet Take 1 tablet by mouth every 4 (four) hours as needed. 09/01/14   Leighton Parody, PA-C  tizanidine (ZANAFLEX) 2 MG capsule Take 1 capsule (2 mg total) by mouth 3 (three) times daily. 09/01/14   Leighton Parody, PA-C  traMADol (ULTRAM) 50 MG tablet Take 1 tablet (50 mg total) by mouth every 6 (six) hours as needed for moderate pain. Patient not taking: Reported on 07/04/2014 07/03/13   Ellwood Handler, PA-C   Physical Exam: Blood pressure 137/72, pulse 88, temperature 97.8 F (36.6 C), temperature source Oral, resp. rate 18, SpO2 97 %. Filed Vitals:   09/03/14 0000 09/03/14 0400 09/03/14 0600 09/03/14 1306  BP:   115/65 137/72  Pulse:   106 88  Temp:   97.9 F (36.6 C) 97.8 F (36.6 C)  TempSrc:      Resp: 18 18 16 18   SpO2: 96% 96% 96% 97%     General:  WM, NAD  Eyes: PERRL, anicteric   ENT: nonerythematous, no exudate  Neck: supple, no JVP  Cardiovascular: RRR, no mrg  Respiratory: CTAB  Abdomen: High pitched bowel sounds, abdomen tensely distended "like a soccer ball" and TTP throughout, no guarding  Skin:   No surrounding erythema or induration  Musculoskeletal: normal tone and bulk, LLT normal strength and 2+ pulse  Psychiatric: A&Ox4  Neurologic: HOH, able to ambulate on left extremity, no focal deficits  Labs on Admission:  Basic Metabolic Panel:  Recent Labs Lab 09/02/14 0629  NA 136  K 4.3  CL 106  CO2 21  GLUCOSE 144*  BUN 18  CREATININE 1.25  CALCIUM 8.0*   Liver Function Tests: No results for input(s): AST, ALT, ALKPHOS, BILITOT, PROT, ALBUMIN in the last 168 hours. No results for input(s): LIPASE, AMYLASE in the last 168 hours. No results for input(s): AMMONIA in the last 168 hours. CBC:  Recent Labs Lab 09/02/14 0629 09/03/14 0601  WBC 7.6 8.8  HGB 8.1* 8.4*  HCT 26.3* 26.6*  MCV 83.5 80.9  PLT 154 161   Cardiac Enzymes: No results for input(s): CKTOTAL, CKMB, CKMBINDEX, TROPONINI in the last 168 hours. BNP: Invalid input(s): POCBNP CBG: No results for input(s): GLUCAP in the last 168 hours.  Radiological Exams on Admission: No results found.  Time spent: 45 minutes  Janece Canterbury [facility] Pager [phone]  If 7PM-7AM, please contact night-coverage [email or web address] Password [facility] 09/03/2014, 5:57 PM

## 2014-09-03 NOTE — Progress Notes (Signed)
PATIENT ID: Million Maharaj  MRN: [id number]  DOB/AGE:  79-Oct-1930 / 79 y.o.  2 Days Post-Op Procedure(s) (LRB): TOTAL HIP ARTHROPLASTY (Left)    PROGRESS NOTE Subjective: Patient is alert, oriented, no Nausea, no Vomiting, yes passing gas, no Bowel Movement. Taking PO well. Denies SOB, Chest or Calf Pain. Using Incentive Spirometer, PAS in place. Ambulate WBAT with Pt walking 100 ft yesterday. Patient reports pain as 3 on 0-10 scale .  He does complain of abdominal discomfort.    Objective: Vital signs in last 24 hours: Filed Vitals:   09/02/14 2200 09/03/14 0000 09/03/14 0400 09/03/14 0600  BP: 139/65   115/65  Pulse: 105   106  Temp: 99.1 F (37.3 C)   97.9 F (36.6 C)  TempSrc:      Resp: 18 18 18 16   SpO2: 96% 96% 96% 96%      Intake/Output from previous day: I/O last 3 completed shifts: In: 5765.8 [P.O.:960; I.V.:4470.8; Blood:335] Out: 3200 [Urine:3200]   Intake/Output this shift:     LABORATORY DATA:  Recent Labs  09/02/14 0629 09/03/14 0601  WBC 7.6 8.8  HGB 8.1* 8.4*  HCT 26.3* 26.6*  PLT 154 161  NA 136  --   K 4.3  --   CL 106  --   CO2 21  --   BUN 18  --   CREATININE 1.25  --   GLUCOSE 144*  --   CALCIUM 8.0*  --     Examination: Neurologically intact Neurovascular intact Sensation intact distally Intact pulses distally Dorsiflexion/Plantar flexion intact Incision: scant drainage No cellulitis present Compartment soft} XR AP&Lat of hip shows well placed\fixed THA  Assessment:   2 Days Post-Op Procedure(s) (LRB): TOTAL HIP ARTHROPLASTY (Left) ADDITIONAL DIAGNOSIS:  Expected Acute Blood Loss Anemia, coronary arteries disease, history of abdominal aortic aneurysm, history of stroke, history of atherosclerosis.  Plan: PT/OT WBAT, THA  posterior precautions  DVT Prophylaxis: SCDx72 hrs, ASA 325 mg BID x 2 weeks  DISCHARGE PLAN: Home when pt passes therapy goals, likely tomorrow.  DISCHARGE NEEDS: HHPT, HHRN, Walker and 3-in-1 comode  seat

## 2014-09-03 NOTE — Progress Notes (Signed)
Physical Therapy Treatment Patient Details Name: Tyler [name] MRN: [id number] DOB: 06-18-1929 Today's Date: 09/03/2014    History of Present Illness Patient is an 81 yo male admitted 09/01/14 now s/p Lt THA with posterior precautions  PMH:  AAA with stent, HTN, CAD, CABG, HLD, CVA, OA, HOH.    PT Comments    Pt is progressing daily with his mobility, but still at times requires up to mod assist for safety during mobility.  He would be safest going to SNF level rehab as wife endorses she cannot provide physical assistance when he discharges home with her.  PT will continue to follow acutely to progress.  He will need BID sessions tomorrow if the plan is to d/c home tomorrow.   Follow Up Recommendations  SNF (pt will likely refuse, so max HH services)     Equipment Recommendations  None recommended by PT    Recommendations for Other Services   NA     Precautions / Restrictions Precautions Precautions: Posterior Hip;Fall Precaution Comments: Pt able to report 2/3 posterior hip precautions Restrictions LLE Weight Bearing: Weight bearing as tolerated    Mobility  Bed Mobility Overal bed mobility: Needs Assistance Bed Mobility: Sit to Supine       Sit to supine: Min assist   General bed mobility comments: Min assist to support left leg when transitioning back to supine in the bed.   Transfers Overall transfer level: Needs assistance Equipment used: Rolling walker (2 wheeled) Transfers: Sit to/from Stand Sit to Stand: Mod assist         General transfer comment: Mod assist to support trunk to get to standing from low recliner chair.  Pt with heavy reliance on upper extremity support when going sit<-> stand.  Verbal cues for safe hand placement and slow descent.  Pt, again today with strong posterior lean when initially standing until forward momentum is established to start to walk.   Ambulation/Gait Ambulation/Gait assistance: Min assist;Mod assist Ambulation Distance  (Feet): 100 Feet Assistive device: Rolling walker (2 wheeled) Gait Pattern/deviations: Step-to pattern;Antalgic;Trunk flexed Gait velocity: decreased Gait velocity interpretation: Below normal speed for age/gender General Gait Details: Pt continues to need moderate assist at first when coming to standing and taking his first dozen steps or so.  Today he was staying too close to the front of the RW and tipping back, so verbal cues for staying inside of the RW, but not too close to the front.  Verbal cues for upright posture and no pivoting when truning around with RW.            Balance Overall balance assessment: Needs assistance Sitting-balance support: Feet supported;No upper extremity supported Sitting balance-Leahy Scale: Fair   Postural control: Posterior lean Standing balance support: Bilateral upper extremity supported Standing balance-Leahy Scale: Poor                      Cognition Arousal/Alertness: Awake/alert Behavior During Therapy: WFL for tasks assessed/performed Overall Cognitive Status: Within Functional Limits for tasks assessed                      Exercises Total Joint Exercises Ankle Circles/Pumps: AROM;Both;20 reps;Supine Quad Sets: AROM;Left;10 reps;Supine Short Arc Quad: AROM;Left;10 reps;Supine Heel Slides: AAROM;Left;10 reps;Supine Hip ABduction/ADduction: AAROM;Left;10 reps;Supine Long Arc Quad: AROM;Both;10 reps;Supine        Pertinent Vitals/Pain Pain Assessment: 0-10 Pain Score: 6  Pain Location: left hip Pain Descriptors / Indicators: Aching;Sore Pain Intervention(s): Limited activity within  patient's tolerance;Monitored during session;Repositioned           PT Goals (current goals can now be found in the care plan section) Acute Rehab PT Goals Patient Stated Goal: to go home Progress towards PT goals: Progressing toward goals (slowly)    Frequency  7X/week    PT Plan Current plan remains appropriate        End of Session   Activity Tolerance: Patient limited by fatigue;Patient limited by pain Patient left: in bed;with call bell/phone within reach;with family/visitor present     Time: 1109-1140 PT Time Calculation (min) (ACUTE ONLY): 31 min  Charges:  $Gait Training: 8-22 mins $Therapeutic Exercise: 8-22 mins                     Laquan Beier B. Ninah Moccio, PT, DPT [phone]   09/03/2014, 1:17 PM

## 2014-09-03 NOTE — Progress Notes (Signed)
PT Cancellation Note  Patient Details Name: Tyler [name] MRN: [id number] DOB: 1928-12-12   Cancelled Treatment:    Reason Eval/Treat Not Completed: Other (comment).  Pt is miserably constipated.  Despite encouragement that walking may help encourage his bowels to move he was unwilling to participate in PM PT session.  RN made aware and going to try to convince the pt to participate with PT.  PT will f/u with pt in AM.    Thanks,  Wells Guiles B. Lynn, [place], DPT [phone]   09/03/2014, 4:08 PM

## 2014-09-04 DIAGNOSIS — K913 Postprocedural intestinal obstruction: Secondary | ICD-10-CM

## 2014-09-04 LAB — MAGNESIUM: Magnesium: 2.5 mg/dL (ref 1.5–2.5)

## 2014-09-04 LAB — CBC
HEMATOCRIT: 25.2 % — AB (ref 39.0–52.0)
Hemoglobin: 8.1 g/dL — ABNORMAL LOW (ref 13.0–17.0)
MCH: 26.4 pg (ref 26.0–34.0)
MCHC: 32.1 g/dL (ref 30.0–36.0)
MCV: 82.1 fL (ref 78.0–100.0)
Platelets: 168 10*3/uL (ref 150–400)
RBC: 3.07 MIL/uL — ABNORMAL LOW (ref 4.22–5.81)
RDW: 15.8 % — AB (ref 11.5–15.5)
WBC: 8.2 10*3/uL (ref 4.0–10.5)

## 2014-09-04 LAB — BASIC METABOLIC PANEL
Anion gap: 5 (ref 5–15)
BUN: 16 mg/dL (ref 6–23)
CALCIUM: 7.9 mg/dL — AB (ref 8.4–10.5)
CO2: 26 mmol/L (ref 19–32)
Chloride: 106 mmol/L (ref 96–112)
Creatinine, Ser: 1.26 mg/dL (ref 0.50–1.35)
GFR calc Af Amer: 59 mL/min — ABNORMAL LOW (ref >90)
GFR, EST NON AFRICAN AMERICAN: 51 mL/min — AB (ref >90)
GLUCOSE: 106 mg/dL — AB (ref 70–99)
Potassium: 4.6 mmol/L (ref 3.5–5.1)
Sodium: 137 mmol/L (ref 135–145)

## 2014-09-04 MED ORDER — POLYETHYLENE GLYCOL 3350 17 G PO PACK
17.0000 g | PACK | Freq: Every day | ORAL | Status: DC
  Administered 2014-09-04 – 2014-09-05 (×2): 17 g via ORAL
  Filled 2014-09-04 (×2): qty 1

## 2014-09-04 MED ORDER — KETOROLAC TROMETHAMINE 30 MG/ML IJ SOLN
15.0000 mg | Freq: Three times a day (TID) | INTRAMUSCULAR | Status: AC
  Administered 2014-09-04 – 2014-09-05 (×2): 15 mg via INTRAVENOUS

## 2014-09-04 MED ORDER — BISACODYL 10 MG RE SUPP
10.0000 mg | Freq: Every day | RECTAL | Status: DC
  Administered 2014-09-04 – 2014-09-05 (×2): 10 mg via RECTAL
  Filled 2014-09-04 (×2): qty 1

## 2014-09-04 NOTE — Consult Note (Signed)
PROGRESS NOTE  Tyler [name] PYP:[id number] DOB: [birth year] DOA: 09/01/2014 PCP: PROVIDER NOT IN SYSTEM  Assessment/Plan:  Abdominal pain and distension likely due to post-operative ileus. No signs of severe obstruction or constipation on X-ray. Passing gas now, however, so anticipate he may already be improving.  - clear liquid diet for now - Minimize narcotics - Start toradol and tylenol to help with alternative pain management -ambulate -suppository  Arthritis s/p total arthroplasty left hip - Per surgery  CAD, stable, continue aspirin, plavix - Resume beta blocker and statin pending tolerating diet  CVA, continue aspirin and plavix  Post-operative anemia - Transfuse for hgb < 7 or symptomatic anemia  Code Status: full Family Communication: SNF when bowels move Disposition Plan:      HPI/Subjective: Passing less gas today But tolerating eating  Objective: Filed Vitals:   09/04/14 0644  BP: 133/62  Pulse: 96  Temp: 98.7 F (37.1 C)  Resp:     Intake/Output Summary (Last 24 hours) at 09/04/14 0803 Last data filed at 09/04/14 0646  Gross per 24 hour  Intake 1623.75 ml  Output   2350 ml  Net -726.25 ml   There were no vitals filed for this visit.  Exam:   General:  A+Ox3, NAD  Cardiovascular: rrr  Respiratory: clear  Abdomen: +BS, mildly distended  Musculoskeletal: no edema   Data Reviewed: Basic Metabolic Panel:  Recent Labs Lab 09/02/14 0629  NA 136  K 4.3  CL 106  CO2 21  GLUCOSE 144*  BUN 18  CREATININE 1.25  CALCIUM 8.0*   Liver Function Tests: No results for input(s): AST, ALT, ALKPHOS, BILITOT, PROT, ALBUMIN in the last 168 hours. No results for input(s): LIPASE, AMYLASE in the last 168 hours. No results for input(s): AMMONIA in the last 168 hours. CBC:  Recent Labs Lab 09/02/14 0629 09/03/14 0601  WBC 7.6 8.8  HGB 8.1* 8.4*  HCT 26.3* 26.6*  MCV 83.5 80.9  PLT 154 161   Cardiac Enzymes: No results for  input(s): CKTOTAL, CKMB, CKMBINDEX, TROPONINI in the last 168 hours. BNP (last 3 results) No results for input(s): BNP in the last 8760 hours.  ProBNP (last 3 results) No results for input(s): PROBNP in the last 8760 hours.  CBG: No results for input(s): GLUCAP in the last 168 hours.  No results found for this or any previous visit (from the past 240 hour(s)).   Studies: Dg Abd Portable 2v  09/03/2014   CLINICAL DATA:  Abdominal pain for 1 day  EXAM: PORTABLE ABDOMEN - 2 VIEW  COMPARISON:  Chest radiograph 08/22/2014  FINDINGS: Gas-filled loop of transverse colon measures 8 cm. There is stool in the ascending colon. Gas in the rectum. No evidence of intraperitoneal free air on the lateral decubitus view. No dilated loops of small bowel. Abdominal aortic stent graft is noted  IMPRESSION: Gas distended colon with gas extending to the rectum. Findings suggest colonic ileus.   Electronically Signed   By: Suzy Bouchard M.D.   On: 09/03/2014 18:37    Scheduled Meds: . sodium chloride   Intravenous Once  . acetaminophen  1,000 mg Oral TID  . aspirin EC  325 mg Oral Q breakfast  . bisacodyl  10 mg Rectal Daily  . clopidogrel  75 mg Oral Q breakfast  . docusate sodium  100 mg Oral BID  . ketorolac  30 mg Intravenous 3 times per day  . metoprolol tartrate  12.5 mg Oral BID   Continuous Infusions: . dextrose  5 % and 0.45 % NaCl with KCl 20 mEq/L 75 mL/hr at 09/03/14 1830   Antibiotics Given (last 72 hours)    Date/Time Action Medication Dose   09/01/14 0849 Given   ceFAZolin (ANCEF) IVPB 2 g/50 mL premix 2 g   09/01/14 0933 Given  [EXP:03/26/2015]   cefUROXime (ZINACEF) injection 1.5 g      Active Problems:   AAA (abdominal aortic aneurysm)   CAD (coronary artery disease)   Arthritis of left hip   Abdominal distension   Abdominal pain    Time spent: 25 min    Petina Muraski  [facility] Pager [phone] . If 7PM-7AM, please contact night-coverage at [email or web address],  password [facility] 09/04/2014, 8:03 AM  LOS: 3 days

## 2014-09-04 NOTE — Clinical Social Work Placement (Signed)
Clinical Social Work Department CLINICAL SOCIAL WORK PLACEMENT NOTE 09/04/2014  Patient:  Tyler [name], Tyler [name]  Account Number:  [id number] Admit date:  09/01/2014  Clinical Social Worker:  Wylene Men  Date/time:  09/02/2014 12:54 PM  Clinical Social Work is seeking post-discharge placement for this patient at the following level of care:   SKILLED NURSING   (*CSW will update this form in Epic as items are completed)   09/02/2014  Patient/family provided with [place] Department of Clinical Social Work's list of facilities offering this level of care within the geographic area requested by the patient (or if unable, by the patient's family).  09/02/2014  Patient/family informed of their freedom to choose among providers that offer the needed level of care, that participate in Medicare, Medicaid or managed care program needed by the patient, have an available bed and are willing to accept the patient.  09/02/2014  Patient/family informed of [facility]' ownership interest in [facility], as well as of the fact that they are under no obligation to receive care at this facility.  PASARR submitted to EDS on 09/02/2014 PASARR number received on 09/02/2014  FL2 transmitted to all facilities in geographic area requested by pt/family on  09/02/2014 FL2 transmitted to all facilities within larger geographic area on   Patient informed that his/her managed care company has contracts with or will negotiate with  certain facilities, including the following:     Patient/family informed of bed offers received:  09/02/2014 Patient chooses bed at [place] Physician recommends and patient chooses bed at  [place]  Patient to be transferred to [facility] on  09/05/2014 Patient to be transferred to facility by [facility] Patient and family notified of transfer on 09/04/2014 Name of family member notified:  janet  The following physician request were entered in  Epic:   Additional Comments:  Nonnie Done, [place] [phone]  Psychiatric & Orthopedics (5N 1-16) Clinical Social Worker

## 2014-09-04 NOTE — Progress Notes (Signed)
Physical Therapy Treatment Patient Details Name: Tyler [name] MRN: [id number] DOB: Feb 25, 1929 Today's Date: 09/04/2014    History of Present Illness Patient is an 81 yo male admitted 09/01/14 now s/p Lt THA with posterior precautions  PMH:  AAA with stent, HTN, CAD, CABG, HLD, CVA, OA, HOH.    PT Comments    Pt continues to make progress now every session.  He was able to do standing hip exercises with min assist for balance in standing during his PM session.  He anticipates d/c to SNF for rehab tomorrow.  PT will continue to follow acutely.    Follow Up Recommendations  SNF     Equipment Recommendations  None recommended by PT    Recommendations for Other Services   NA     Precautions / Restrictions Precautions Precautions: Posterior Hip;Fall Precaution Comments: Pt able to adhere to posterior hip precautions during functional activity, sit<>stand, bed mobility Restrictions Weight Bearing Restrictions: Yes LLE Weight Bearing: Weight bearing as tolerated    Mobility   Transfers Overall transfer level: Needs assistance Equipment used: Rolling walker (2 wheeled) Transfers: Sit to/from Stand Sit to Stand: Min assist         General transfer comment: Min assist to support trunk for multiple stands from lower recliner chair.  Verbal cues to reinforce safe hand placement.   Ambulation/Gait Ambulation/Gait assistance: Min assist Ambulation Distance (Feet): 150 Feet Assistive device: Rolling walker (2 wheeled) Gait Pattern/deviations: Step-through pattern;Antalgic;Trunk flexed     General Gait Details: Pt with continued progress with mildly antalgic gait pattern and better gait speed.  Min assist for balance.           Balance Overall balance assessment: Needs assistance Sitting-balance support: Feet supported;No upper extremity supported Sitting balance-Leahy Scale: Good     Standing balance support: Bilateral upper extremity supported Standing balance-Leahy  Scale: Fair                      Cognition Arousal/Alertness: Awake/alert Behavior During Therapy: WFL for tasks assessed/performed Overall Cognitive Status: Within Functional Limits for tasks assessed                      Exercises Total Joint Exercises Ankle Circles/Pumps: AROM;Both;20 reps;Supine Quad Sets: AROM;Left;10 reps;Supine Short Arc Quad: AROM;Left;10 reps;Supine Heel Slides: AAROM;Left;10 reps;Supine;Standing Hip ABduction/ADduction: AAROM;Left;10 reps;Supine Long Arc Quad: AROM;Both;10 reps;Supine Knee Flexion: AROM;Left;10 reps;Standing Marching in Standing: AROM;Left;10 reps;Standing Standing Hip Extension: AROM;Left;10 reps;Standing        Pertinent Vitals/Pain Pain Assessment: Faces Faces Pain Scale: Hurts little more Pain Location: left hip Pain Descriptors / Indicators: Sore Pain Intervention(s): Limited activity within patient's tolerance;Monitored during session;Repositioned           PT Goals (current goals can now be found in the care plan section) Acute Rehab PT Goals Patient Stated Goal: to go home Progress towards PT goals: Progressing toward goals    Frequency  7X/week    PT Plan Current plan remains appropriate       End of Session   Activity Tolerance: Patient limited by fatigue;Patient limited by pain Patient left: in bed;Other (comment) (seated EOB with OT entering room)     Time: 4696-2952 PT Time Calculation (min) (ACUTE ONLY): 27 min  Charges:  $Gait Training: 8-22 mins $Therapeutic Exercise: 8-22 mins            Mathis Cashman B. Cainen Burnham, PT, DPT [phone]   09/04/2014, 3:45 PM

## 2014-09-04 NOTE — Progress Notes (Signed)
Physical Therapy Treatment Patient Details Name: Tyler [name] MRN: [id number] DOB: Sep 16, 1928 Today's Date: 09/04/2014    History of Present Illness Patient is an 81 yo male admitted 09/01/14 now s/p Lt THA with posterior precautions  PMH:  AAA with stent, HTN, CAD, CABG, HLD, CVA, OA, HOH.    PT Comments    Pt feels much better today than yesterday and is making good progress now with gait and mobility.  He still requires min assist overall and would continue to benefit from SNF placement for rehab at discharge to get him to a supervision level of care.    Follow Up Recommendations  SNF     Equipment Recommendations  None recommended by PT    Recommendations for Other Services   NA     Precautions / Restrictions Precautions Precautions: Posterior Hip;Fall Precaution Comments: Pt able to adhere to posterior hip precautions during functional activity, sit<>stand, bed mobility Restrictions Weight Bearing Restrictions: Yes LLE Weight Bearing: Weight bearing as tolerated    Mobility  Bed Mobility Overal bed mobility: Needs Assistance Bed Mobility: Supine to Sit     Supine to sit: Supervision     General bed mobility comments: Supervision for safety due to slow speed of transitions and heavy reliance on hands for leverage to get to sitting EOB.   Transfers Overall transfer level: Needs assistance Equipment used: Rolling walker (2 wheeled) Transfers: Sit to/from Stand Sit to Stand: Min assist         General transfer comment: Min assist to support trunk for balance during transitions.    Ambulation/Gait Ambulation/Gait assistance: Min assist Ambulation Distance (Feet): 150 Feet Assistive device: Rolling walker (2 wheeled) Gait Pattern/deviations: Step-through pattern;Antalgic;Trunk flexed     General Gait Details: Pt with mildly antalgic gait pattern today.  We did preform warm-up exercises in bed before getting up which seemed to help with his siffness his first  few steps. Verbal cues for upright posture during gait.  Speed much improved today.           Balance Overall balance assessment: Needs assistance Sitting-balance support: Feet supported;No upper extremity supported Sitting balance-Leahy Scale: Good     Standing balance support: Bilateral upper extremity supported;No upper extremity supported;Single extremity supported Standing balance-Leahy Scale: Fair                      Cognition Arousal/Alertness: Awake/alert Behavior During Therapy: WFL for tasks assessed/performed Overall Cognitive Status: Within Functional Limits for tasks assessed                      Exercises Total Joint Exercises Ankle Circles/Pumps: AROM;Both;20 reps;Supine Quad Sets: AROM;Left;10 reps;Supine Short Arc Quad: AROM;Left;10 reps;Supine Heel Slides: AAROM;Left;10 reps;Supine Hip ABduction/ADduction: AAROM;Left;10 reps;Supine Long Arc Quad: AROM;Both;10 reps;Supine        Pertinent Vitals/Pain Pain Assessment: Faces Faces Pain Scale: Hurts little more Pain Location: left hip Pain Descriptors / Indicators: Aching Pain Intervention(s): Limited activity within patient's tolerance;Monitored during session;Premedicated before session;Repositioned           PT Goals (current goals can now be found in the care plan section) Acute Rehab PT Goals Patient Stated Goal: to go home Progress towards PT goals: Progressing toward goals    Frequency  7X/week    PT Plan Current plan remains appropriate       End of Session   Activity Tolerance: Patient limited by fatigue;Patient limited by pain Patient left: in chair;with call bell/phone within reach  Time: 5427-0623 PT Time Calculation (min) (ACUTE ONLY): 23 min  Charges:  $Gait Training: 8-22 mins $Therapeutic Exercise: 8-22 mins              Taite Baldassari B. Freeland, [place], DPT [phone]   09/04/2014, 3:36 PM

## 2014-09-04 NOTE — Progress Notes (Signed)
Patient ID: Jamond Neels, male   DOB: 04/17/1929, 81 y.o.   MRN: [id number] PATIENT ID: Reign Bartnick  MRN: [id number]  DOB/AGE:  08-10-1928 / 81 y.o.  3 Days Post-Op Procedure(s) (LRB): TOTAL HIP ARTHROPLASTY (Left)    PROGRESS NOTE Subjective: Patient is alert, oriented, decreased Nausea, no Vomiting, yes passing gas, no Bowel Movement. Taking PO sips, abd still 2+ distended. Denies SOB, Chest or Calf Pain. Using Incentive Spirometer, PAS in place. Ambulate WBAT, 100 ft yesterday before GI Issues Patient reports pain as 2 on 0-10 scale  .    Objective: Vital signs in last 24 hours: Filed Vitals:   09/03/14 2039 09/04/14 0000 09/04/14 0400 09/04/14 0644  BP: 153/73   133/62  Pulse: 106   96  Temp: 98.8 F (37.1 C)   98.7 F (37.1 C)  TempSrc: Oral   Oral  Resp:  18 18   SpO2: 98% 98% 99% 96%      Intake/Output from previous day: I/O last 3 completed shifts: In: 5114.6 [P.O.:1130; I.V.:3984.6] Out: 4575 [Urine:4575]   Intake/Output this shift:     LABORATORY DATA:  Recent Labs  09/02/14 0629 09/03/14 0601  WBC 7.6 8.8  HGB 8.1* 8.4*  HCT 26.3* 26.6*  PLT 154 161  NA 136  --   K 4.3  --   CL 106  --   CO2 21  --   BUN 18  --   CREATININE 1.25  --   GLUCOSE 144*  --   CALCIUM 8.0*  --     Examination: Neurologically intact ABD soft Neurovascular intact Sensation intact distally Intact pulses distally Dorsiflexion/Plantar flexion intact Incision: scant drainage No cellulitis present Compartment soft} XR AP&Lat of hip shows well placed\fixed THA  Assessment:   3 Days Post-Op Procedure(s) (LRB): TOTAL HIP ARTHROPLASTY (Left) ADDITIONAL DIAGNOSIS:  Expected Acute Blood Loss Anemia, CAD,HTN,Hx CVA.  Plan: PT/OT WBAT, THA  posterior precautions  DVT Prophylaxis: SCDx72 hrs, ASA 325 mg BID x 2 weeks  DISCHARGE PLAN: Skilled Nursing Facility/Rehab, [facility] when GI issues resolved  DISCHARGE NEEDS: HHPT, Walker and 3-in-1 comode seat

## 2014-09-04 NOTE — Clinical Social Work Psychosocial (Signed)
Clinical Social Work Department BRIEF PSYCHOSOCIAL ASSESSMENT 09/04/2014  Patient:  Tyler [name], Tyler [name]     Account Number:  [id number]     Admit date:  09/01/2014  Clinical Social Worker:  Wylene Men  Date/Time:  09/02/2014 12:49 PM  Referred by:  Physician  Date Referred:  09/02/2014 Referred for  Psychosocial assessment  SNF Placement   Other Referral:   none   Interview type:  Patient Other interview type:   patient and patient's wife    PSYCHOSOCIAL DATA Living Status:  WIFE Admitted from facility:   Level of care:   Primary support name:  Tyler [name] Primary support relationship to patient:  SPOUSE Degree of support available:   strong    CURRENT CONCERNS Current Concerns  Post-Acute Placement   Other Concerns:   none    SOCIAL WORK ASSESSMENT / PLAN CSW completed assessment with both patient and patient's wife per patient's request.  Patient is alert and oriented x4.  Patient is a bundled patient who has made arrangements with MD office RNCM to be discharged to [facility] once medically discharged.  Patient has waxed and waned with wishes to be discharged to [facility] vs. home.  Patient is frustrated that he is not able to return home after hospital discharge, but acknowledged understanding of the need for STR once discharged.  Wife is also agreeable with discharge to [place]. CSW provided support and normalized these frustrations.   Assessment/plan status:  Psychosocial Support/Ongoing Assessment of Needs Other assessment/ plan:   FL2  PASARR   Information/referral to community resources:   SNF/STR    PATIENT'S/FAMILY'S RESPONSE TO PLAN OF CARE: Patient is a bundled patient who has arranged with MD RNCM to be discharged to [facility] at time of discharge. Pre-admission paperwork already completed.       Nonnie Done, [place] [phone]  Psychiatric & Orthopedics (5N 1-16) Clinical Social Worker

## 2014-09-04 NOTE — Progress Notes (Signed)
Occupational Therapy Treatment Patient Details Name: Tyler [name] MRN: [id number] DOB: [birth year] Today's Date: 09/04/2014    History of present illness Patient is an 81 yo male admitted 09/01/14 now s/p Lt THA with posterior precautions  PMH:  AAA with stent, HTN, CAD, CABG, HLD, CVA, OA, HOH.   OT comments  Patient progressing nicely towards OT goals, continue plan of care for now.   Follow Up Recommendations  SNF;Supervision/Assistance - 24 hour    Equipment Recommendations   (TBD next venue of care)    Recommendations for Other Services  None at this time    Precautions / Restrictions Precautions Precautions: Posterior Hip;Fall Precaution Comments: Pt able to adhere to posterior hip precautions during functional activity, sit<>stand, bed mobility Restrictions Weight Bearing Restrictions: Yes LLE Weight Bearing: Weight bearing as tolerated       Mobility Bed Mobility Overal bed mobility: Needs Assistance Bed Mobility: Sit to Supine     Supine to sit: Supervision     General bed mobility comments: Patient able to manage LLE for bed mobility, supervision for safety   Transfers Overall transfer level: Needs assistance Equipment used: Rolling walker (2 wheeled) Transfers: Sit to/from Stand Sit to Stand: Min assist         General transfer comment: Patient stood safely from bed with RW and min assist provided from therapist.     Balance Overall balance assessment: Needs assistance         Standing balance support: Bilateral upper extremity supported Standing balance-Leahy Scale: Poor    ADL Lower Body Dressing: Minimal assistance;Sit to/from stand;Cueing for safety;With adaptive equipment   General ADL Comments: Patient demonstrated ability to use reacher and sock aid for LB ADLs. Therapist educated patient on [place] sponge and LH shoe horn as well. Patient stated [place]. Patient also reports that he plans to discharge to  [facility] for additional rehab     Cognition   Behavior During Therapy: [facility] for tasks assessed/performed Overall Cognitive Status: Within Functional Limits for tasks assessed                Pertinent Vitals/ Pain       Pain Assessment: No/denies pain         Frequency Min 2X/week     Progress Toward Goals  OT Goals(current goals can now be found in the care plan section)  Progress towards OT goals: Progressing toward goals     Plan Discharge plan remains appropriate       End of Session Equipment Utilized During Treatment: Gait belt;Rolling walker   Activity Tolerance Patient tolerated treatment well   Patient Left with call bell/phone within reach;with family/visitor present;in bed     Time: 1447-1510 OT Time Calculation (min): 23 min  Charges: OT General Charges $OT Visit: 1 Procedure OT Treatments $Self Care/Home Management : 23-37 mins  Stefani Baik , MS, OTR/L, CLT Pager: [phone]   09/04/2014, 3:27 PM

## 2014-09-05 LAB — BASIC METABOLIC PANEL
Anion gap: 5 (ref 5–15)
BUN: 15 mg/dL (ref 6–23)
CALCIUM: 7.9 mg/dL — AB (ref 8.4–10.5)
CO2: 25 mmol/L (ref 19–32)
Chloride: 108 mmol/L (ref 96–112)
Creatinine, Ser: 1.27 mg/dL (ref 0.50–1.35)
GFR, EST AFRICAN AMERICAN: 59 mL/min — AB (ref >90)
GFR, EST NON AFRICAN AMERICAN: 50 mL/min — AB (ref >90)
Glucose, Bld: 96 mg/dL (ref 70–99)
Potassium: 4.3 mmol/L (ref 3.5–5.1)
SODIUM: 138 mmol/L (ref 135–145)

## 2014-09-05 LAB — TYPE AND SCREEN
ABO/RH(D): A NEG
ANTIBODY SCREEN: NEGATIVE
UNIT DIVISION: 0
Unit division: 0

## 2014-09-05 MED ORDER — TIZANIDINE HCL 2 MG PO CAPS: 2.0000 mg | ORAL_CAPSULE | Freq: Three times a day (TID) | ORAL | Status: DC

## 2014-09-05 MED ORDER — ASPIRIN EC 325 MG PO TBEC: 325.0000 mg | DELAYED_RELEASE_TABLET | Freq: Two times a day (BID) | ORAL | Status: DC

## 2014-09-05 MED ORDER — HYDROCODONE-ACETAMINOPHEN 5-325 MG PO TABS: 1.0000 | ORAL_TABLET | Freq: Four times a day (QID) | ORAL | Status: DC | PRN

## 2014-09-05 NOTE — Discharge Planning (Signed)
Patient will discharge today per MD order. Patient will discharge to [facility] (bundle) RN to call report prior to transportation to [place]   CSW sent discharge summary to SNF for review.  Packet is complete.  RN, patient and family aware of discharge plans.  Nonnie Done, [place] [phone]  Psychiatric & Orthopedics (5N 1-16) Clinical Social Worker

## 2014-09-05 NOTE — Progress Notes (Signed)
CARE MANAGEMENT NOTE 09/05/2014  Patient:  Tyler [name], Tyler [name]   Account Number:  [id number]  Date Initiated:  09/01/2014  Documentation initiated by:  [facility]  Subjective/Objective Assessment:   s/p left total hip arthroplasty     Action/Plan:   PT/OT evals-recommended SNF   Anticipated DC Date:  09/03/2014   Anticipated DC Plan:  SKILLED NURSING FACILITY  In-house referral  Clinical Social Worker      DC Planning Services  CM consult      Choice offered to / List presented to:             Status of service:  Completed, signed off Medicare Important Message given?  YES Date Medicare IM given:  09/05/2014 Medicare IM given by:  [facility]  Discharge Disposition:  [place]  Per UR Regulation:  Reviewed for med. necessity/level of care/duration of stay    Comments:  09/02/14 PT recommneded SNF, referral made to [place]. Will continue to follow until discharge.

## 2014-09-05 NOTE — Consult Note (Signed)
PROGRESS NOTE  Tyler [name] QMG:[id number] DOB: 1929-01-12 DOA: 09/01/2014 PCP: PROVIDER NOT IN SYSTEM  Assessment/Plan:  Abdominal pain and distension likely due to post-operative ileus. No signs of severe obstruction or constipation on X-ray.  -BM -ambulate -suppository PRN -minimize narcotics  Arthritis s/p total arthroplasty left hip - Per surgery  CAD, stable, continue aspirin, plavix - Resume beta blocker and statin pending tolerating diet  CVA, continue aspirin and plavix  Post-operative anemia - Transfuse for hgb < 7 or symptomatic anemia  -will sign off- please call if needed    HPI/Subjective: No SOB, no CP  Objective: Filed Vitals:   09/05/14 0501  BP: 135/68  Pulse: 87  Temp: 97.9 F (36.6 C)  Resp: 18    Intake/Output Summary (Last 24 hours) at 09/05/14 0753 Last data filed at 09/05/14 0501  Gross per 24 hour  Intake 2402.5 ml  Output   2800 ml  Net -397.5 ml   There were no vitals filed for this visit.  Exam:   General:  A+Ox3, NAD  Cardiovascular: rrr  Respiratory: clear  Abdomen: +BS, obese, soft  Musculoskeletal: no edema   Data Reviewed: Basic Metabolic Panel:  Recent Labs Lab 09/02/14 0629 09/04/14 0715  NA 136 137  K 4.3 4.6  CL 106 106  CO2 21 26  GLUCOSE 144* 106*  BUN 18 16  CREATININE 1.25 1.26  CALCIUM 8.0* 7.9*  MG  --  2.5   Liver Function Tests: No results for input(s): AST, ALT, ALKPHOS, BILITOT, PROT, ALBUMIN in the last 168 hours. No results for input(s): LIPASE, AMYLASE in the last 168 hours. No results for input(s): AMMONIA in the last 168 hours. CBC:  Recent Labs Lab 09/02/14 0629 09/03/14 0601 09/04/14 0715  WBC 7.6 8.8 8.2  HGB 8.1* 8.4* 8.1*  HCT 26.3* 26.6* 25.2*  MCV 83.5 80.9 82.1  PLT 154 161 168   Cardiac Enzymes: No results for input(s): CKTOTAL, CKMB, CKMBINDEX, TROPONINI in the last 168 hours. BNP (last 3 results) No results for input(s): BNP in the last 8760  hours.  ProBNP (last 3 results) No results for input(s): PROBNP in the last 8760 hours.  CBG: No results for input(s): GLUCAP in the last 168 hours.  No results found for this or any previous visit (from the past 240 hour(s)).   Studies: Dg Abd Portable 2v  09/03/2014   CLINICAL DATA:  Abdominal pain for 1 day  EXAM: PORTABLE ABDOMEN - 2 VIEW  COMPARISON:  Chest radiograph 08/22/2014  FINDINGS: Gas-filled loop of transverse colon measures 8 cm. There is stool in the ascending colon. Gas in the rectum. No evidence of intraperitoneal free air on the lateral decubitus view. No dilated loops of small bowel. Abdominal aortic stent graft is noted  IMPRESSION: Gas distended colon with gas extending to the rectum. Findings suggest colonic ileus.   Electronically Signed   By: Suzy Bouchard M.D.   On: 09/03/2014 18:37    Scheduled Meds: . sodium chloride   Intravenous Once  . acetaminophen  1,000 mg Oral TID  . aspirin EC  325 mg Oral Q breakfast  . bisacodyl  10 mg Rectal Daily  . clopidogrel  75 mg Oral Q breakfast  . docusate sodium  100 mg Oral BID  . metoprolol tartrate  12.5 mg Oral BID  . polyethylene glycol  17 g Oral Daily   Continuous Infusions: . dextrose 5 % and 0.45 % NaCl with KCl 20 mEq/L 75 mL/hr at 09/04/14 1401  Antibiotics Given (last 72 hours)    None      Active Problems:   AAA (abdominal aortic aneurysm)   CAD (coronary artery disease)   Arthritis of left hip   Abdominal distension   Abdominal pain    Time spent: 15 min    Braniya Farrugia  [facility] Pager [phone] . If 7PM-7AM, please contact night-coverage at [email or web address], password [facility] 09/05/2014, 7:53 AM  LOS: 4 days

## 2014-09-05 NOTE — Discharge Summary (Signed)
Patient ID: Tyler [name] MRN: [id number] DOB/AGE: 08-28-1928 81 y.o.  Admit date: 09/01/2014 Discharge date: 09/05/2014  Admission Diagnoses:  Active Problems:   AAA (abdominal aortic aneurysm)   CAD (coronary artery disease)   Arthritis of left hip   Abdominal distension   Abdominal pain   Discharge Diagnoses:  Same  Past Medical History  Diagnosis Date  . CAD (coronary artery disease)     a. 06/2013 VF Arrest/Cath: 3VD, EF 50%;  b. 06/2013 CABG x 4: LIMA->LAD, VG->PDA->RPL, VG->OM.  [name] Osteoarthritis   . Essential hypertension   . Hyperlipidemia   . Erectile dysfunction   . CVA (cerebral infarction)   . GERD (gastroesophageal reflux disease)   . H/O hiatal hernia   . AAA (abdominal aortic aneurysm)     a. 05/2014 s/p stent grafting [facility]).  . Stroke   . HOH (hard of hearing)     wears hearing aid in right ear  . Bronchitis   . Pneumonia     YRS AGO    Surgeries: Procedure(s): TOTAL HIP ARTHROPLASTY on 09/01/2014   Consultants: Treatment Team:  Janece Canterbury, MD  Discharged Condition: Improved  Hospital Course: Avrum Kimball is an 81 y.o. male who was admitted 09/01/2014 for operative treatment of<principal problem not specified>. Patient has severe unremitting pain that affects sleep, daily activities, and work/hobbies. After pre-op clearance the patient was taken to the operating room on 09/01/2014 and underwent  Procedure(s): TOTAL HIP ARTHROPLASTY.    Patient was given perioperative antibiotics: Anti-infectives    Start     Dose/Rate Route Frequency Ordered Stop   09/01/14 0933  cefUROXime (ZINACEF) injection  Status:  Discontinued       As needed 09/01/14 0936 09/01/14 1104   09/01/14 0600  ceFAZolin (ANCEF) IVPB 2 g/50 mL premix     2 g 100 mL/hr over 30 Minutes Intravenous On call to O.R. 08/31/14 1306 09/01/14 0849       Patient was given sequential compression devices, early ambulation, and chemoprophylaxis to prevent DVT.  Patient benefited  maximally from hospital stay and there were no complications.    Recent vital signs: Patient Vitals for the past 24 hrs:  BP Temp Temp src Pulse Resp SpO2  09/05/14 0501 135/68 mmHg 97.9 F (36.6 C) Oral 87 18 95 %  09/05/14 0338 - - - - 17 96 %  09/05/14 0000 - - - - 17 96 %  09/04/14 2041 (!) 135/59 mmHg 98.6 F (37 C) Oral 93 17 96 %  09/04/14 1600 - - - - 18 98 %  09/04/14 1556 (!) 148/74 mmHg 98.1 F (36.7 C) Oral 87 18 98 %  09/04/14 1200 - - - - 18 98 %     Recent laboratory studies:  Recent Labs  09/03/14 0601  09/04/14 0715 09/05/14 0635  WBC 8.8  --  8.2  --   HGB 8.4*  --  8.1*  --   HCT 26.6*  --  25.2*  --   PLT 161  --  168  --   NA  --   --  137 138  K  --   --  4.6 4.3  CL  --   --  106 108  CO2  --   --  26 25  BUN  --   --  16 15  CREATININE  --   --  1.26 1.27  GLUCOSE  --   --  106* 96  CALCIUM  --   < >  7.9* 7.9*  < > = values in this interval not displayed.   Discharge Medications:     Medication List    STOP taking these medications        aspirin 81 MG tablet  Replaced by:  aspirin EC 325 MG tablet     meloxicam 15 MG tablet  Commonly known as:  MOBIC     oxyCODONE 5 MG immediate release tablet  Commonly known as:  Oxy IR/ROXICODONE      TAKE these medications        aspirin EC 325 MG tablet  Take 1 tablet (325 mg total) by mouth 2 (two) times daily.     atorvastatin 10 MG tablet  Commonly known as:  LIPITOR  Take 10 mg by mouth daily.     clopidogrel 75 MG tablet  Commonly known as:  PLAVIX  Take 1 tablet (75 mg total) by mouth daily with breakfast.     FISH OIL PO  Take 1,000 Units by mouth daily.     folic acid 160 MCG tablet  Commonly known as:  FOLVITE  Take 800 mcg by mouth daily.     HYDROcodone-acetaminophen 5-325 MG per tablet  Commonly known as:  NORCO/VICODIN  Take 1 tablet by mouth every 6 (six) hours as needed for moderate pain.     metoprolol tartrate 25 MG tablet  Commonly known as:  LOPRESSOR  Take  0.5 tablets (12.5 mg total) by mouth 2 (two) times daily.     multivitamin tablet  Take 1 tablet by mouth daily.     nitroGLYCERIN 0.4 MG SL tablet  Commonly known as:  NITROSTAT  Place 1 tablet (0.4 mg total) under the tongue every 5 (five) minutes as needed for chest pain.     tizanidine 2 MG capsule  Commonly known as:  ZANAFLEX  Take 1 capsule (2 mg total) by mouth 3 (three) times daily.     traMADol 50 MG tablet  Commonly known as:  ULTRAM  Take 1 tablet (50 mg total) by mouth every 6 (six) hours as needed for moderate pain.     Vitamin D-3 1000 UNITS Caps  Take 1 capsule by mouth daily.        Diagnostic Studies: Dg Chest 2 View  08/22/2014   CLINICAL DATA:  Pre op examination, surgery Monday AM, hx of SOB and weakness  EXAM: CHEST  2 VIEW  COMPARISON:  09/04/2013  FINDINGS: Changes from CABG surgery are stable. Cardiac silhouette is normal in size and configuration. No mediastinal or hilar masses or adenopathy. Clear lungs. No pleural effusion or pneumothorax.  Bony thorax is demineralized but intact.  IMPRESSION: No acute cardiopulmonary disease.   Electronically Signed   By: Lajean Manes M.D.   On: 08/22/2014 17:30   Dg Abd Portable 2v  09/03/2014   CLINICAL DATA:  Abdominal pain for 1 day  EXAM: PORTABLE ABDOMEN - 2 VIEW  COMPARISON:  Chest radiograph 08/22/2014  FINDINGS: Gas-filled loop of transverse colon measures 8 cm. There is stool in the ascending colon. Gas in the rectum. No evidence of intraperitoneal free air on the lateral decubitus view. No dilated loops of small bowel. Abdominal aortic stent graft is noted  IMPRESSION: Gas distended colon with gas extending to the rectum. Findings suggest colonic ileus.   Electronically Signed   By: Suzy Bouchard M.D.   On: 09/03/2014 18:37    Disposition: 01-Home or Self Care      Discharge Instructions  Call MD / Call 911    Complete by:  As directed   If you experience chest pain or shortness of breath, CALL 911 and  be transported to the hospital emergency room.  If you develope a fever above 101 F, pus (white drainage) or increased drainage or redness at the wound, or calf pain, call your surgeon's office.     Change dressing    Complete by:  As directed   You may change your dressing on day 5, then change the dressing daily with sterile 4 x 4 inch gauze dressing and paper tape.  You may clean the incision with alcohol prior to redressing     Constipation Prevention    Complete by:  As directed   Drink plenty of fluids.  Prune juice may be helpful.  You may use a stool softener, such as Colace (over the counter) 100 mg twice a day.  Use MiraLax (over the counter) for constipation as needed.     Diet - low sodium heart healthy    Complete by:  As directed      Driving restrictions    Complete by:  As directed   No driving for 2 weeks     Follow the hip precautions as taught in Physical Therapy    Complete by:  As directed      Increase activity slowly as tolerated    Complete by:  As directed      Patient may shower    Complete by:  As directed   You may shower without a dressing once there is no drainage.  Do not wash over the wound.  If drainage remains, cover wound with plastic wrap and then shower.           Follow-up Information    Follow up with Kerin Salen, MD In 2 weeks.   Specialty:  Orthopedic Surgery   Contact information:   [place] [address] [phone]        Signed: Hardin Negus Herron Fero R 09/05/2014, 11:05 AM

## 2014-09-05 NOTE — Progress Notes (Signed)
PATIENT ID: Tyler [name]  MRN: [id number]  DOB/AGE:  10/30/1928 / 81 y.o.  4 Days Post-Op Procedure(s) (LRB): TOTAL HIP ARTHROPLASTY (Left)    PROGRESS NOTE Subjective: Patient is alert, oriented, no Nausea, no Vomiting, yes passing gas, yes Bowel Movement. Taking PO well. Denies SOB, Chest or Calf Pain. Using Incentive Spirometer, PAS in place. Ambulate WBAT, pt's abdomianl pain has resolved Patient reports pain as mild  .    Objective: Vital signs in last 24 hours: Filed Vitals:   09/04/14 2041 09/05/14 0000 09/05/14 0338 09/05/14 0501  BP: 135/59   135/68  Pulse: 93   87  Temp: 98.6 F (37 C)   97.9 F (36.6 C)  TempSrc: Oral   Oral  Resp: 17 17 17 18   SpO2: 96% 96% 96% 95%      Intake/Output from previous day: I/O last 3 completed shifts: In: 3666.3 [P.O.:1250; I.V.:2416.3] Out: 3950 [Urine:3950]   Intake/Output this shift: Total I/O In: 240 [P.O.:240] Out: -    LABORATORY DATA:  Recent Labs  09/03/14 0601  09/04/14 0715 09/05/14 0635  WBC 8.8  --  8.2  --   HGB 8.4*  --  8.1*  --   HCT 26.6*  --  25.2*  --   PLT 161  --  168  --   NA  --   --  137 138  K  --   --  4.6 4.3  CL  --   --  106 108  CO2  --   --  26 25  BUN  --   --  16 15  CREATININE  --   --  1.26 1.27  GLUCOSE  --   --  106* 96  CALCIUM  --   < > 7.9* 7.9*  < > = values in this interval not displayed.  Examination: Neurologically intact ABD soft Neurovascular intact Sensation intact distally Intact pulses distally Dorsiflexion/Plantar flexion intact Incision: scant drainage No cellulitis present Compartment soft} XR AP&Lat of hip shows well placed\fixed THA  Assessment:   4 Days Post-Op Procedure(s) (LRB): TOTAL HIP ARTHROPLASTY (Left) ADDITIONAL DIAGNOSIS:  Expected Acute Blood Loss Anemia, CAD,HTN,Hx CVA  Plan: PT/OT WBAT, THA  posterior precautions  DVT Prophylaxis: SCDx72 hrs, ASA 325 mg BID x 2 weeks  DISCHARGE PLAN: Skilled Nursing Facility/Rehab  DISCHARGE  NEEDS: HHPT, HHRN, Walker and 3-in-1 comode seat

## 2014-09-05 NOTE — Progress Notes (Signed)
Physical Therapy Treatment Patient Details Name: Tyler [name] MRN: [id number] DOB: [birth year] Today's Date: 09/05/2014    History of Present Illness Patient is an 81 yo male admitted 09/01/14 now s/p Lt THA with posterior precautions  PMH:  AAA with stent, HTN, CAD, CABG, HLD, CVA, OA, HOH.    PT Comments    Pt is progressing well with his mobility.  He continues to need min guard assist for all mobility and is planning to d/c to SNF for rehab before going home to wife who can only provide supervision.    Follow Up Recommendations  SNF     Equipment Recommendations  None recommended by PT    Recommendations for Other Services   NA     Precautions / Restrictions Precautions Precautions: Posterior Hip;Fall Precaution Comments: Pt able to report 1/3 posterior hip precautions.  PT verbally reviewed again with pt.  Restrictions LLE Weight Bearing: Weight bearing as tolerated    Mobility     Transfers Overall transfer level: Needs assistance Equipment used: Rolling walker (2 wheeled) Transfers: Sit to/from Stand Sit to Stand: Min guard         General transfer comment: Min guard assist for safety during transitions to stand.  Pt relying heavily on upper extremity support during transitions.   Ambulation/Gait Ambulation/Gait assistance: Min guard Ambulation Distance (Feet): 200 Feet Assistive device: Rolling walker (2 wheeled) Gait Pattern/deviations: Step-through pattern;Antalgic;Trunk flexed Gait velocity: decreased Gait velocity interpretation: Below normal speed for age/gender General Gait Details: Pt with mildly antalgic gait pattern.  Verbal cues for upright posture.        Balance Overall balance assessment: Needs assistance Sitting-balance support: Feet supported;No upper extremity supported Sitting balance-Leahy Scale: Good     Standing balance support: No upper extremity supported;Bilateral upper extremity supported;Single extremity supported Standing  balance-Leahy Scale: Fair                      Cognition Arousal/Alertness: Awake/alert Behavior During Therapy: WFL for tasks assessed/performed Overall Cognitive Status: Within Functional Limits for tasks assessed                      Exercises Total Joint Exercises Ankle Circles/Pumps: AROM;Both;20 reps;Supine Quad Sets: AROM;Left;10 reps;Supine Short Arc Quad: AROM;Left;10 reps;Supine Heel Slides: AAROM;Left;10 reps;Supine;Standing Hip ABduction/ADduction: AAROM;Left;10 reps;Supine Long Arc Quad: AROM;Both;10 reps;Supine Knee Flexion: AROM;Left;10 reps;Standing Marching in Standing: AROM;Left;10 reps;Standing Standing Hip Extension: AROM;Left;10 reps;Standing        Pertinent Vitals/Pain Pain Assessment: 0-10 Pain Score: 5  Pain Location: left hip Pain Descriptors / Indicators: Aching;Burning Pain Intervention(s): Limited activity within patient's tolerance;Monitored during session;Premedicated before session;Repositioned           PT Goals (current goals can now be found in the care plan section) Acute Rehab PT Goals Patient Stated Goal: to go home Progress towards PT goals: Progressing toward goals    Frequency  7X/week    PT Plan Current plan remains appropriate       End of Session   Activity Tolerance: Patient limited by fatigue;Patient limited by pain Patient left: in chair;with call bell/phone within reach     Time: 1024-1058 PT Time Calculation (min) (ACUTE ONLY): 34 min  Charges:  $Gait Training: 8-22 mins $Therapeutic Exercise: 8-22 mins                      Kasir Hallenbeck B. Federica Allport, PT, DPT [phone]   09/05/2014, 3:48 PM

## 2014-09-11 ENCOUNTER — Encounter: Payer: Self-pay | Admitting: Adult Health

## 2014-09-11 ENCOUNTER — Non-Acute Institutional Stay (SKILLED_NURSING_FACILITY): Payer: Medicare Other | Admitting: Adult Health

## 2014-09-11 DIAGNOSIS — E785 Hyperlipidemia, unspecified: Secondary | ICD-10-CM

## 2014-09-11 DIAGNOSIS — D62 Acute posthemorrhagic anemia: Secondary | ICD-10-CM | POA: Diagnosis not present

## 2014-09-11 DIAGNOSIS — R600 Localized edema: Secondary | ICD-10-CM | POA: Diagnosis not present

## 2014-09-11 DIAGNOSIS — I2510 Atherosclerotic heart disease of native coronary artery without angina pectoris: Secondary | ICD-10-CM | POA: Diagnosis not present

## 2014-09-11 DIAGNOSIS — I10 Essential (primary) hypertension: Secondary | ICD-10-CM | POA: Diagnosis not present

## 2014-09-11 DIAGNOSIS — M1990 Unspecified osteoarthritis, unspecified site: Secondary | ICD-10-CM | POA: Diagnosis not present

## 2014-09-11 DIAGNOSIS — M1612 Unilateral primary osteoarthritis, left hip: Secondary | ICD-10-CM

## 2014-09-11 NOTE — Progress Notes (Signed)
Patient ID: Tyler [name], male   DOB: 10-27-1928, 81 y.o.   MRN: [id number]   09/11/2014  Facility:  Nursing Home Location:  [place] Room Number: 101-P LEVEL OF CARE:  SNF (31)   Chief Complaint  Patient presents with  . Hospitalization Follow-up    Osteoarthritis S/P left total hip arthroplasty, anemia, hyperlipidemia, CAD, hypertension and edema    HISTORY OF PRESENT ILLNESS:   This is an 81 year old male who has been admitted to [facility] on 09/05/14 from [facility] with osteoarthritis S/P left total hip arthroplasty. He has past medical history of AAA, CVA, CABG, GERD, hyperlipidemia and hypertension. He has been admitted for a short-term rehabilitation.  PAST MEDICAL HISTORY:  Past Medical History  Diagnosis Date  . CAD (coronary artery disease)     a. 06/2013 VF Arrest/Cath: 3VD, EF 50%;  b. 06/2013 CABG x 4: LIMA->LAD, VG->PDA->RPL, VG->OM.  [name] Osteoarthritis   . Essential hypertension   . Hyperlipidemia   . Erectile dysfunction   . CVA (cerebral infarction)   . GERD (gastroesophageal reflux disease)   . H/O hiatal hernia   . AAA (abdominal aortic aneurysm)     a. 05/2014 s/p stent grafting [facility]).  . Stroke   . HOH (hard of hearing)     wears hearing aid in right ear  . Bronchitis   . Pneumonia     YRS AGO    CURRENT MEDICATIONS: Reviewed per MAR/see medication list  No Known Allergies   REVIEW OF SYSTEMS:  GENERAL: no change in appetite, no fatigue, no weight changes, no fever, chills or weakness RESPIRATORY: no cough, SOB, DOE, wheezing, hemoptysis CARDIAC: no chest pain, or palpitations GI: no abdominal pain, diarrhea, constipation, heart burn, nausea or vomiting  PHYSICAL EXAMINATION  GENERAL: no acute distress, obese EYES: conjunctivae normal, sclerae normal, normal eye lids NECK: supple, trachea midline, no neck masses, no thyroid tenderness, no thyromegaly LYMPHATICS: no LAN in the neck, no  supraclavicular LAN RESPIRATORY: breathing is even & unlabored, BS CTAB CARDIAC: RRR, no murmur,no extra heart sounds, BLE edema 2+ GI: abdomen soft, normal BS, no masses, no tenderness, no hepatomegaly, no splenomegaly EXTREMITIES: Able to move 4 extremities PSYCHIATRIC: the patient is alert & oriented to person, affect & behavior appropriate  LABS/RADIOLOGY: Labs reviewed: Basic Metabolic Panel:  Recent Labs  09/02/14 0629 09/04/14 0715 09/05/14 0635  NA 136 137 138  K 4.3 4.6 4.3  CL 106 106 108  CO2 21 26 25   GLUCOSE 144* 106* 96  BUN 18 16 15   CREATININE 1.25 1.26 1.27  CALCIUM 8.0* 7.9* 7.9*  MG  --  2.5  --    CBC:  Recent Labs  08/22/14 1621 09/02/14 0629 09/03/14 0601 09/04/14 0715  WBC 6.2 7.6 8.8 8.2  NEUTROABS 3.6  --   --   --   HGB 9.9* 8.1* 8.4* 8.1*  HCT 31.9* 26.3* 26.6* 25.2*  MCV 82.6 83.5 80.9 82.1  PLT 180 154 161 168   Dg Chest 2 View  08/22/2014   CLINICAL DATA:  Pre op examination, surgery Monday AM, hx of SOB and weakness  EXAM: CHEST  2 VIEW  COMPARISON:  09/04/2013  FINDINGS: Changes from CABG surgery are stable. Cardiac silhouette is normal in size and configuration. No mediastinal or hilar masses or adenopathy. Clear lungs. No pleural effusion or pneumothorax.  Bony thorax is demineralized but intact.  IMPRESSION: No acute cardiopulmonary disease.   Electronically Signed  By: Lajean Manes M.D.   On: 08/22/2014 17:30   Dg Abd Portable 2v  09/03/2014   CLINICAL DATA:  Abdominal pain for 1 day  EXAM: PORTABLE ABDOMEN - 2 VIEW  COMPARISON:  Chest radiograph 08/22/2014  FINDINGS: Gas-filled loop of transverse colon measures 8 cm. There is stool in the ascending colon. Gas in the rectum. No evidence of intraperitoneal free air on the lateral decubitus view. No dilated loops of small bowel. Abdominal aortic stent graft is noted  IMPRESSION: Gas distended colon with gas extending to the rectum. Findings suggest colonic ileus.   Electronically  Signed   By: Suzy Bouchard M.D.   On: 09/03/2014 18:37    ASSESSMENT/PLAN:  Osteoarthritis S/P left total hip arthroplasty - for rehabilitation; continue aspirin enteric-coated 325 mg 1 by mouth twice a day for DVT prophylaxis; Norco 5/325 mg 1 tab by mouth every 6 hours when necessary and Ultram 50 mg 1 tab by mouth every 6 hours when necessary for pain; Zanaflex 2 mg 1 by mouth 3 times a day for muscle spasm  Hyperlipidemia - continue Lipitor 10 mg by mouth daily and Fish oil 1000 mg by mouth daily  CAD - stable; continue Plavix 75 mg by mouth daily and nitroglycerin when necessary  Hypertension - well controlled; continue Lopressor 12.5 mg by mouth twice a day  Bilateral lower extremity edema - start Lasix 20 mg 1 tab by mouth daily 3 days and bilateral TED hose, knee-high, on in a.m. and off at at bedtime  Anemia, acute blood loss - start ferrous sulfate 325 mg 1 tab by mouth daily   Goals of care:  Short-term rehabilitation   Labs/test ordered:  CBC in 1 week ; patient will arrange @ PCP's office   Spent 50 minutes in patient care.   [facility], NP [employer] [phone]

## 2014-09-12 ENCOUNTER — Non-Acute Institutional Stay (SKILLED_NURSING_FACILITY): Payer: Medicare Other | Admitting: Internal Medicine

## 2014-09-12 DIAGNOSIS — M1612 Unilateral primary osteoarthritis, left hip: Secondary | ICD-10-CM

## 2014-09-12 DIAGNOSIS — R600 Localized edema: Secondary | ICD-10-CM | POA: Diagnosis not present

## 2014-09-12 DIAGNOSIS — I10 Essential (primary) hypertension: Secondary | ICD-10-CM | POA: Diagnosis not present

## 2014-09-12 DIAGNOSIS — I2510 Atherosclerotic heart disease of native coronary artery without angina pectoris: Secondary | ICD-10-CM | POA: Diagnosis not present

## 2014-09-12 DIAGNOSIS — D62 Acute posthemorrhagic anemia: Secondary | ICD-10-CM

## 2014-09-12 DIAGNOSIS — E785 Hyperlipidemia, unspecified: Secondary | ICD-10-CM

## 2014-09-12 DIAGNOSIS — M1990 Unspecified osteoarthritis, unspecified site: Secondary | ICD-10-CM

## 2014-09-12 DIAGNOSIS — J209 Acute bronchitis, unspecified: Secondary | ICD-10-CM

## 2014-09-12 DIAGNOSIS — R300 Dysuria: Secondary | ICD-10-CM | POA: Diagnosis not present

## 2014-09-12 NOTE — Progress Notes (Signed)
Patient ID: Tyler [name], male   DOB: Feb 20, 1929, 81 y.o.   MRN: [id number]    [facility]  Chief Complaint  Patient presents with  . New Admit To SNF   No Known Allergies  Code status: full code  HPI 81 y/o patient is here for STR post hospital admission from 09/01/14-09/05/14 with left hip OA. He underwent left total hip arthroplasty. He has PMH of CVA, CAD, GERD, HTN, HLD, AAA among others. He is seen in his room today. He complaints of cough with yellow mucus, runny nose for 2 days. He feels stuffed up in his chest and nose. Denies fever or chills but feels fatigued. He complaints of dull ache in left hip area and has muscle spasm. Current zanaflex and pain regimen has been helpful. He also complaints of burning with urination. Denies abdominal and flank pain. Denies hematuria.  Review of Systems  Constitutional: Negative for fever, chills,diaphoresis.  HENT: Negative for hearing loss and sore throat.   Eyes: Negative for blurred vision, double vision and discharge.  Respiratory: Negative for shortness of breath and wheezing.   Cardiovascular: Negative for chest pain, palpitations Gastrointestinal: Negative for heartburn, nausea, vomiting, abdominal pain. Last bowel movement yesterday Musculoskeletal: Negative for back pain, falls  Skin: Negative for itching and rash.  Neurological: Negative for dizziness, tingling, focal weakness and headaches.  Psychiatric/Behavioral: Negative for depression     Past Medical History  Diagnosis Date  . CAD (coronary artery disease)     a. 06/2013 VF Arrest/Cath: 3VD, EF 50%;  b. 06/2013 CABG x 4: LIMA->LAD, VG->PDA->RPL, VG->OM.  [name] Osteoarthritis   . Essential hypertension   . Hyperlipidemia   . Erectile dysfunction   . CVA (cerebral infarction)   . GERD (gastroesophageal reflux disease)   . H/O hiatal hernia   . AAA (abdominal aortic aneurysm)     a. 05/2014 s/p stent grafting [facility]).  . Stroke     . HOH (hard of hearing)     wears hearing aid in right ear  . Bronchitis   . Pneumonia     YRS AGO   Past Surgical History  Procedure Laterality Date  . Coronary artery bypass graft N/A 06/27/2013    Procedure: CORONARY ARTERY BYPASS GRAFTING (CABG);  Surgeon: Ivin Poot, MD;  Location: [place];  Service: Open Heart Surgery;  Laterality: N/A;  times four utilizing the left internal mammary artery and the right greater saphenous vein harvested endoscopically  . Tee without cardioversion N/A 06/27/2013    Procedure: TRANSESOPHAGEAL ECHOCARDIOGRAM (TEE);  Surgeon: Ivin Poot, MD;  Location: [place];  Service: Open Heart Surgery;  Laterality: N/A;  . Endovein harvest of greater saphenous vein Right 06/27/2013    Procedure: ENDOVEIN HARVEST OF GREATER SAPHENOUS VEIN;  Surgeon: Ivin Poot, MD;  Location: [place];  Service: Open Heart Surgery;  Laterality: Right;  . Inner ear surgery Left 1982  . Left heart catheterization with coronary angiogram N/A 06/27/2013    Procedure: LEFT HEART CATHETERIZATION WITH CORONARY ANGIOGRAM;  Surgeon: Sinclair Grooms, MD;  Location: [facility] CATH LAB;  Service: Cardiovascular;  Laterality: N/A;  iabp insertion, cpr, intubation, emergent cvts  . Tonsillectomy    . Inner ear surgery      severed a nerve in his left ear.   . Eye surgery      bilateral cataracts  . Total hip arthroplasty Left 09/01/2014    Procedure: TOTAL HIP ARTHROPLASTY;  Surgeon: Kathalene Frames  Mayer Camel, MD;  Location: [place];  Service: Orthopedics;  Laterality: Left;   Medication reviewed. See MAR  Family History  Problem Relation Age of Onset  . Heart disease Sister   . Heart disease Brother   . Colon cancer Neg Hx    History   Social History  . Marital Status: Married    Spouse Name: N/A  . Number of Children: N/A  . Years of Education: N/A   Occupational History  . Not on file.   Social History Main Topics  . Smoking status: Never Smoker   . Smokeless tobacco: Never Used  . Alcohol  Use: Yes     Comment: Rare  . Drug Use: No  . Sexual Activity: Not on file   Other Topics Concern  . Not on file   Social History Narrative   Physical exam BP 118/60 mmHg  Pulse 88  Temp(Src) 98.5 F (36.9 C)  Resp 20  SpO2 96%  General- elderly male in no acute distress Head- atraumatic, normocephalic Eyes- PERRLA, EOMI, no pallor, no icterus Neck- no lymphadenopathy Cardiovascular- normal s1,s2, no murmurs, 1+ leg edema Respiratory- bilateral decreased air entry, rhonchi present, no wheeze, no crackles Abdomen- bowel sounds present, soft, non tender Musculoskeletal- able to move all 4 extremities, left hip ROM limited, using walker Neurological- no focal deficit Psychiatry- alert and oriented to person, place and time, normal mood and affect Skin- surgical incision healing well in left hip area  Labs reviewed  Assessment/plan  Osteoarthritis  S/P left total hip arthroplasty. Will have patient work with PT/OT as tolerated to regain strength and restore function.  Fall precautions are in place. Continue aspirin 325 mg bid for DVT prophylaxis. continue Norco 5/325 mg every 6 hours prn and Ultram 50 mg 1every 6 hours prn for pain. Continue Zanaflex 2 mg tid for muscle spasm. Has f/u with orthopedics. Add ted hose to help with leg edema. Monitor bowel movement  Acute bronchitis Start z pack 5 days course, add incentive spirometer for pulmonary toileting  Dysuria Send urine for culture, encourage hydration, monitor clinically  Lower extremity edema Continue lasix 20 mg daily, add ted hose  HTN bp stable, continue lopressor 12.5 mg bid  Hyperlipidemia continue Lipitor 10 mg daily  CAD  Remains chest pain free. continue aspirin, plavix, lopressor and prn NTG  Anemia, acute blood loss Continue ferrous sulfate 325 mg daily, monitor h&h  Goals of care- short term rehabilitation  Family/ staff Communication: reviewed care plan with patient and nursing  supervisor  Blanchie Serve, MD  [place] [phone] (Monday-Friday 8 am - 5 pm) [phone] (afterhours)

## 2014-09-17 ENCOUNTER — Encounter: Payer: Self-pay | Admitting: Adult Health

## 2014-09-17 ENCOUNTER — Non-Acute Institutional Stay (SKILLED_NURSING_FACILITY): Payer: Medicare Other | Admitting: Adult Health

## 2014-09-17 DIAGNOSIS — D62 Acute posthemorrhagic anemia: Secondary | ICD-10-CM | POA: Diagnosis not present

## 2014-09-17 DIAGNOSIS — R600 Localized edema: Secondary | ICD-10-CM | POA: Diagnosis not present

## 2014-09-17 DIAGNOSIS — E785 Hyperlipidemia, unspecified: Secondary | ICD-10-CM | POA: Diagnosis not present

## 2014-09-17 DIAGNOSIS — M1990 Unspecified osteoarthritis, unspecified site: Secondary | ICD-10-CM | POA: Diagnosis not present

## 2014-09-17 DIAGNOSIS — I10 Essential (primary) hypertension: Secondary | ICD-10-CM | POA: Diagnosis not present

## 2014-09-17 DIAGNOSIS — M1612 Unilateral primary osteoarthritis, left hip: Secondary | ICD-10-CM

## 2014-09-17 DIAGNOSIS — I2510 Atherosclerotic heart disease of native coronary artery without angina pectoris: Secondary | ICD-10-CM

## 2014-09-17 NOTE — Progress Notes (Signed)
Patient ID: Tyler [name], male   DOB: 06/19/1929, 81 y.o.   MRN: [id number]   09/17/2014  Facility:  [facility]ursing Home Location:  [place] Room Number: 101-P LEVEL OF CARE:  SNF (31)   Chief Complaint  Patient presents with  . Discharge Note    Osteoarthritis S/P left total hip arthroplasty, anemia, hyperlipidemia, CAD, hypertension and edema    HISTORY OF PRESENT ILLNESS:   This is an 81 year old male who is for discharge home with Home health PT and OT. He has been admitted to [facility] on 09/05/14 from [facility] with osteoarthritis S/P left total hip arthroplasty. He has past medical history of AAA, CVA, CABG, GERD, hyperlipidemia and hypertension. Patient was admitted to this facility for short-term rehabilitation after the patient's recent hospitalization.  Patient has completed SNF rehabilitation and therapy has cleared the patient for discharge.  PAST MEDICAL HISTORY:  Past Medical History  Diagnosis Date  . CAD (coronary artery disease)     a. 06/2013 VF Arrest/Cath: 3VD, EF 50%;  b. 06/2013 CABG x 4: LIMA->LAD, VG->PDA->RPL, VG->OM.  [name] Osteoarthritis   . Essential hypertension   . Hyperlipidemia   . Erectile dysfunction   . CVA (cerebral infarction)   . GERD (gastroesophageal reflux disease)   . H/O hiatal hernia   . AAA (abdominal aortic aneurysm)     a. 05/2014 s/p stent grafting [facility]).  . Stroke   . HOH (hard of hearing)     wears hearing aid in right ear  . Bronchitis   . Pneumonia     YRS AGO    CURRENT MEDICATIONS: Reviewed per MAR/see medication list  No Known Allergies   REVIEW OF SYSTEMS:  GENERAL: no change in appetite, no fatigue, no weight changes, no fever, chills or weakness RESPIRATORY: no cough, SOB, DOE, wheezing, hemoptysis CARDIAC: no chest pain, or palpitations GI: no abdominal pain, diarrhea, constipation, heart burn, nausea or vomiting  PHYSICAL EXAMINATION  GENERAL: no acute  distress, obese NECK: supple, trachea midline, no neck masses, no thyroid tenderness, no thyromegaly LYMPHATICS: no LAN in the neck, no supraclavicular LAN RESPIRATORY: breathing is even & unlabored, BS CTAB CARDIAC: RRR, no murmur,no extra heart sounds, BLE edema 2+ GI: abdomen soft, normal BS, no masses, no tenderness, no hepatomegaly, no splenomegaly EXTREMITIES: Able to move 4 extremities PSYCHIATRIC: the patient is alert & oriented to person, affect & behavior appropriate  LABS/RADIOLOGY: Labs reviewed: Basic Metabolic Panel:  Recent Labs  09/02/14 0629 09/04/14 0715 09/05/14 0635  NA 136 137 138  K 4.3 4.6 4.3  CL 106 106 108  CO2 21 26 25   GLUCOSE 144* 106* 96  BUN 18 16 15   CREATININE 1.25 1.26 1.27  CALCIUM 8.0* 7.9* 7.9*  MG  --  2.5  --    CBC:  Recent Labs  08/22/14 1621 09/02/14 0629 09/03/14 0601 09/04/14 0715  WBC 6.2 7.6 8.8 8.2  NEUTROABS 3.6  --   --   --   HGB 9.9* 8.1* 8.4* 8.1*  HCT 31.9* 26.3* 26.6* 25.2*  MCV 82.6 83.5 80.9 82.1  PLT 180 154 161 168   Dg Chest 2 View  08/22/2014   CLINICAL DATA:  Pre op examination, surgery Monday AM, hx of SOB and weakness  EXAM: CHEST  2 VIEW  COMPARISON:  09/04/2013  FINDINGS: Changes from CABG surgery are stable. Cardiac silhouette is normal in size and configuration. No mediastinal or hilar masses or adenopathy. Clear lungs.  No pleural effusion or pneumothorax.  Bony thorax is demineralized but intact.  IMPRESSION: No acute cardiopulmonary disease.   Electronically Signed   By: Lajean Manes M.D.   On: 08/22/2014 17:30   Dg Abd Portable 2v  09/03/2014   CLINICAL DATA:  Abdominal pain for 1 day  EXAM: PORTABLE ABDOMEN - 2 VIEW  COMPARISON:  Chest radiograph 08/22/2014  FINDINGS: Gas-filled loop of transverse colon measures 8 cm. There is stool in the ascending colon. Gas in the rectum. No evidence of intraperitoneal free air on the lateral decubitus view. No dilated loops of small bowel. Abdominal aortic  stent graft is noted  IMPRESSION: Gas distended colon with gas extending to the rectum. Findings suggest colonic ileus.   Electronically Signed   By: Suzy Bouchard M.D.   On: 09/03/2014 18:37    ASSESSMENT/PLAN:  Osteoarthritis S/P left total hip arthroplasty - for rehabilitation; continue aspirin enteric-coated 325 mg 1 by mouth twice a day for DVT prophylaxis; Norco 5/325 mg 1 tab by mouth every 6 hours when necessary and Ultram 50 mg 1 tab by mouth every 6 hours when necessary for pain; Zanaflex 2 mg 1 by mouth 3 times a day for muscle spasm  Hyperlipidemia - continue Lipitor 10 mg by mouth daily and Fish oil 1000 mg by mouth daily  CAD - stable; continue Plavix 75 mg by mouth daily and nitroglycerin when necessary  Hypertension - well controlled; continue Lopressor 12.5 mg by mouth twice a day  Bilateral lower extremity edema - add Lasix 20 mgt 1 tab PO Q D X 2 days and bilateral TED hose, knee-high, on in a.m. and off at at bedtime  Anemia, acute blood loss - hgb 8.1; continue ferrous sulfate 325 mg 1 tab by mouth daily   I have filled out patient's discharge paperwork and written prescriptions.  Patient will receive home health PT and OT.  Total discharge time: Greater than 30 minutes  Discharge time involved coordination of the discharge process with social worker, nursing staff and therapy department. Medical justification for home health services verified.    [facility], NP [employer] [phone]

## 2015-01-05 IMAGING — CR DG CHEST 2V
2 series · 2 of 2 positions shown · non-contrast
Comparison: None.

CLINICAL DATA: Chest pain.  Coronary atherosclerotic disease

EXAM:
CHEST  2 VIEW

[w chest pa]
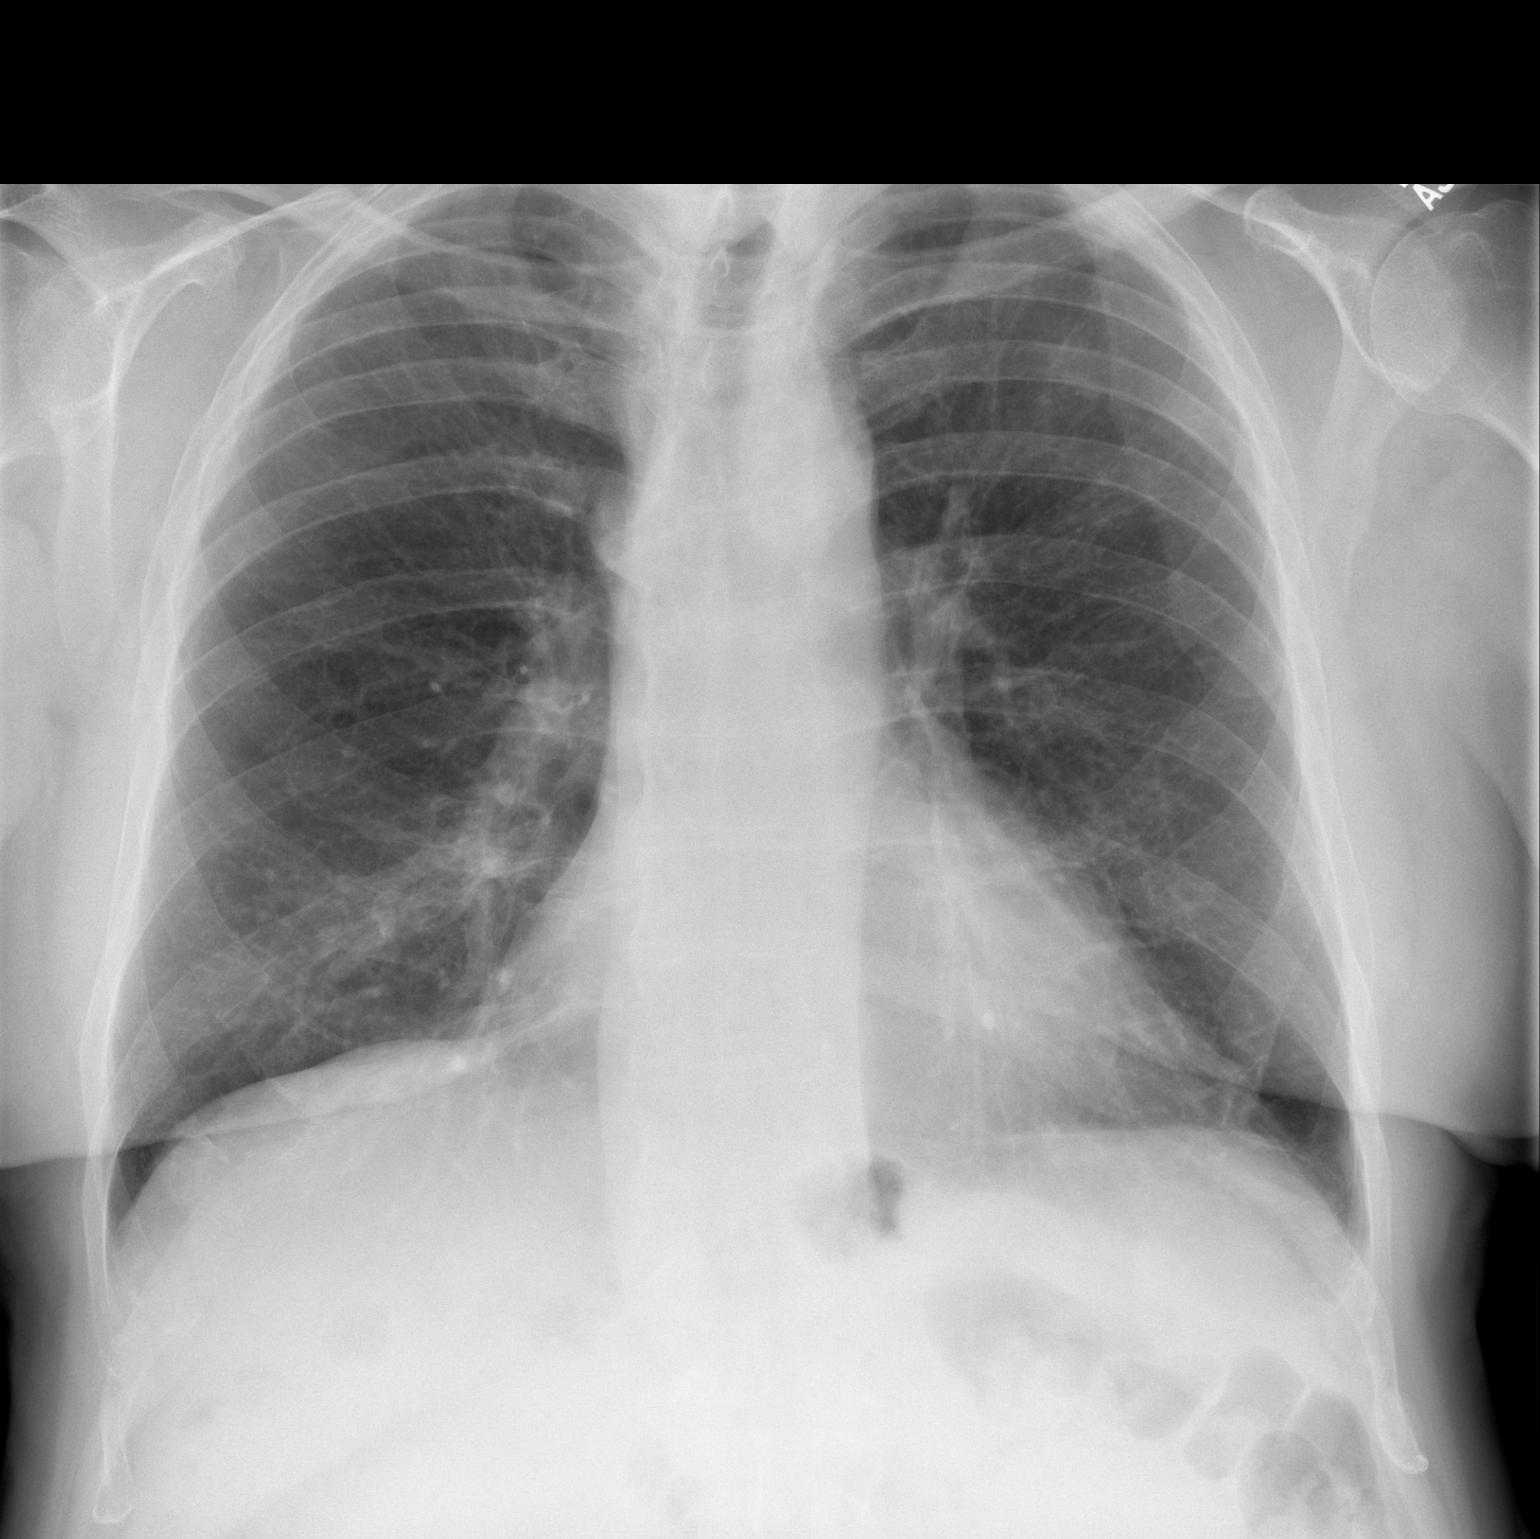

[w chest lat]
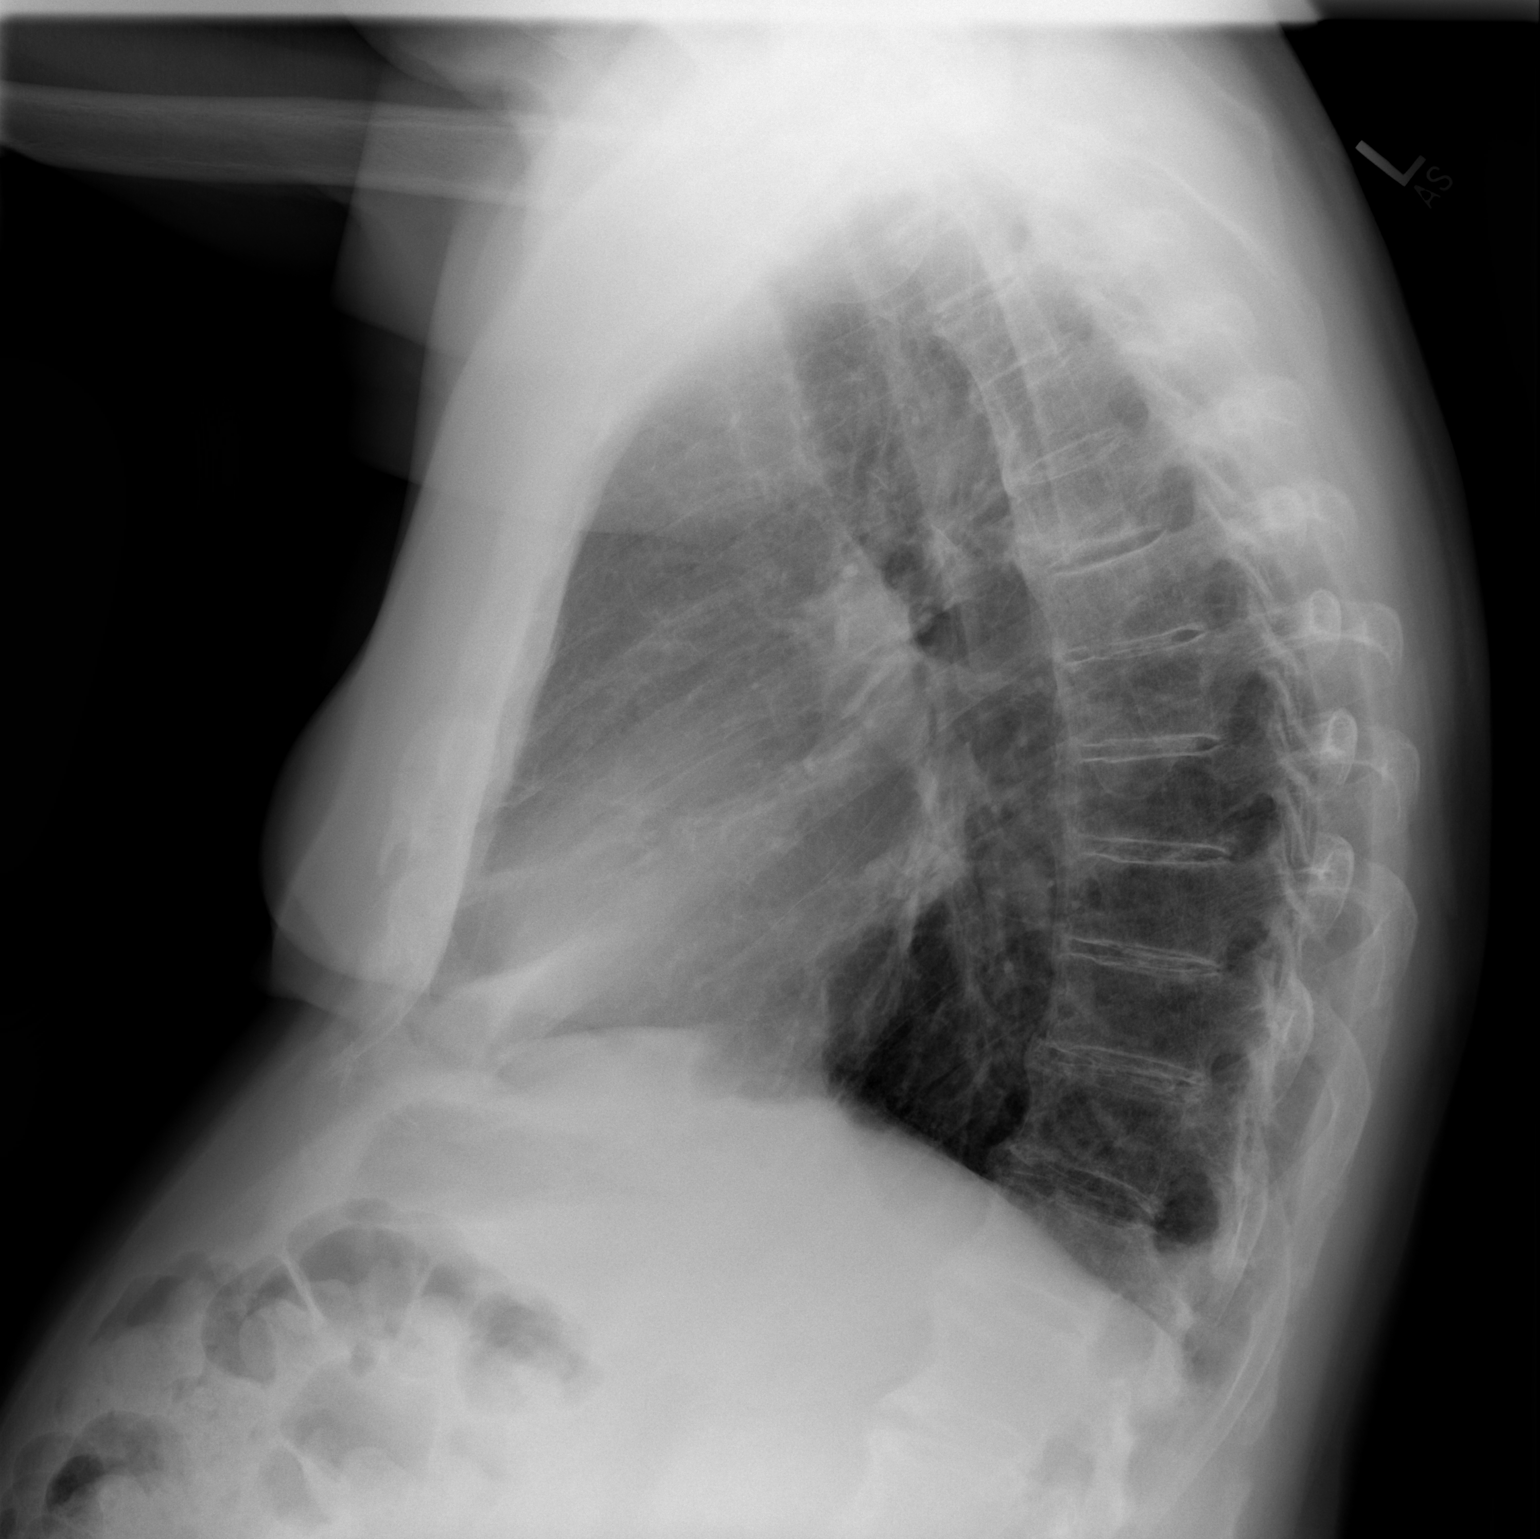

[2 of 2 positions shown; findings below may reference images not displayed]

FINDINGS: Mild hyperinflation. Negative for heart failure. Negative for
infiltrate effusion or mass. Negative for pneumonia.
IMPRESSION: No active cardiopulmonary disease.

## 2015-01-08 IMAGING — CR DG CHEST 1V PORT
1 series · 1 of 1 positions shown · non-contrast
Comparison: Preoperative study June 24, 2013.

CLINICAL DATA: Status post CABG

EXAM:
PORTABLE CHEST - 1 VIEW

[AP]
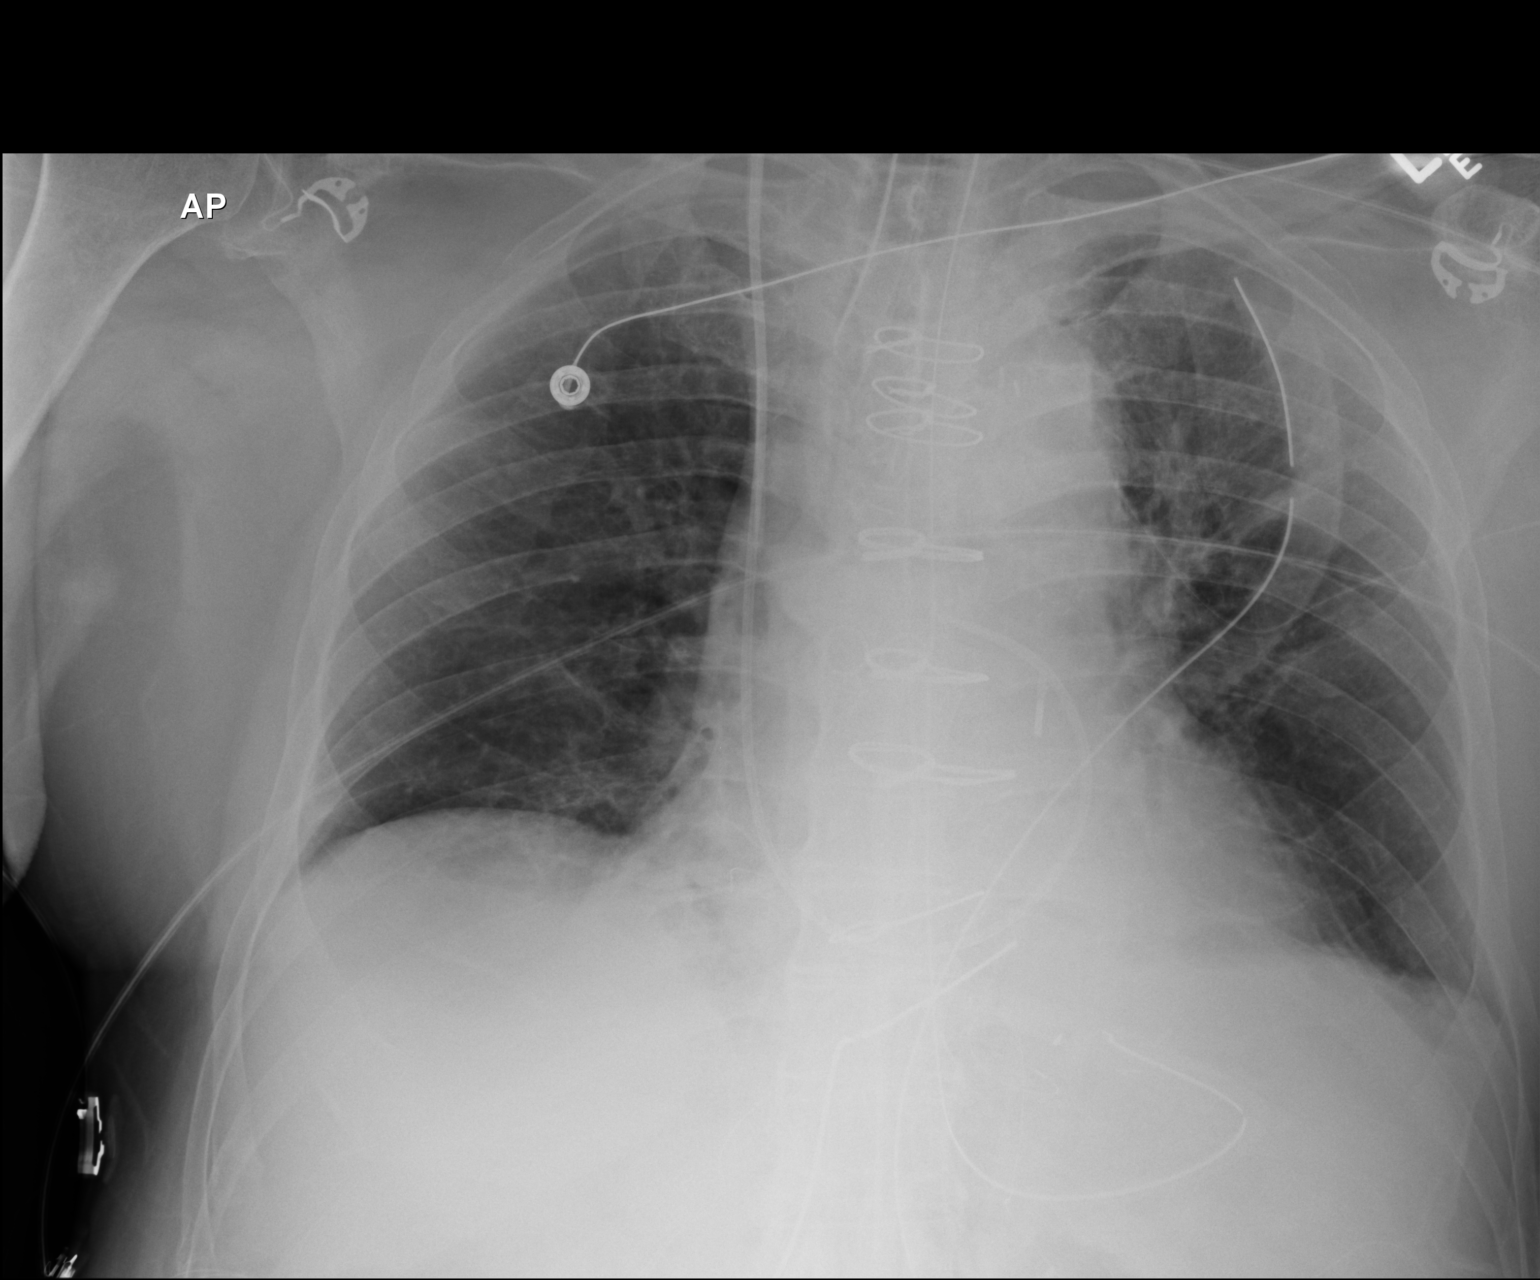

[1 of 1 positions shown; findings below may reference images not displayed]

FINDINGS: The lungs are mildly hypoinflated. There is no focal infiltrate or
significant pleural effusion. Minimal prominence of the upper lobe
pulmonary vascularity on the left is noted. There is no
pneumothorax. A large caliber left-sided chest tube is in place. The
endotracheal tube tip approximately 3.5 cm above the crotch of the
carina. A mediastinal drain is present inferiorly. The
esophagogastric tube tip and proximal port lie in the region of the
gastric cardia. The cardiopericardial silhouette is normal in size.
Six intact sternal wires are demonstrated. The pulmonary vascularity
does not appear engorged. The marker of the intra-aortic balloon
pump lies approximately 8.5 cm inferior to the cranial-most aspect
of the aortic arch.
IMPRESSION: 1. The patient is status post CABG. There is no evidence of
pulmonary alveolar interstitial edema. Minimal subsegmental
atelectasis in the left upper lobe may be present. There is no
pneumothorax.
2. The intra-aortic balloon pump metallic marker lies approximately
8.5 cm below the superior aspect of the aortic arch. Correlation
with the adequacy of the function of the device at this position is
recommended.

## 2015-04-08 ENCOUNTER — Ambulatory Visit: Payer: Medicare Other | Admitting: Interventional Cardiology

## 2015-06-08 ENCOUNTER — Ambulatory Visit (INDEPENDENT_AMBULATORY_CARE_PROVIDER_SITE_OTHER): Payer: Medicare Other | Admitting: Physician Assistant

## 2015-06-08 ENCOUNTER — Encounter: Payer: Self-pay | Admitting: Physician Assistant

## 2015-06-08 VITALS — BP 112/82 | HR 62 | Ht 70.0 in | Wt 224.0 lb

## 2015-06-08 DIAGNOSIS — E785 Hyperlipidemia, unspecified: Secondary | ICD-10-CM

## 2015-06-08 DIAGNOSIS — I10 Essential (primary) hypertension: Secondary | ICD-10-CM | POA: Diagnosis not present

## 2015-06-08 DIAGNOSIS — I2510 Atherosclerotic heart disease of native coronary artery without angina pectoris: Secondary | ICD-10-CM

## 2015-06-08 NOTE — Patient Instructions (Signed)
Medication Instructions:  Stop taking Plavix-all other medications remain the same  Labwork: None  Testing/Procedures: None  Follow-Up: Your physician wants you to follow-up in: 1 year. You will receive a reminder letter in the mail two months in advance. If you don't receive a letter, please call our office to schedule the follow-up appointment.      If you need a refill on your cardiac medications before your next appointment, please call your pharmacy.

## 2015-06-08 NOTE — Assessment & Plan Note (Signed)
Patient with ECABG in 2014. Patient is doing well without cardiac symptoms. He is exercising regularly. Discussed this patient in detail with Dr. Tamala Julian who says that the patient can come off his Plavix. As he ages his risk of bleeding becomes higher. Continue aspirin 81 mg daily. Follow up in one year.

## 2015-06-08 NOTE — Assessment & Plan Note (Signed)
Due for blood work in January. Managed by primary care.

## 2015-06-08 NOTE — Assessment & Plan Note (Signed)
Blood Pressure controlled 

## 2015-06-08 NOTE — Progress Notes (Signed)
Cardiology Office Note   Date:  06/08/2015   ID:  Tyler [name], DOB 04/19/1929, MRN [id number]  PCP: Dr. Carolynn Sayers: [facility] Cardiologist:  Dr. Daneen Schick Chief Complaint: arthritis    History of Present Illness: Tyler [name] is a 81 y.o. male who presents for yearly follow-up. He has a history of CAD status post ECABG 06/2013 for V. fib arrest and ostial left main stenosis with a LIMA to the LAD, SVG to the PDA/PL 8, SVG to the OM.  His EF was 50%. He also has hypertension, hyperlipidemia, and prior stroke. He underwent stent grafting of his AAA and [facility] 05/2014. He most recently underwent left hip replacement in 08/2014.  Patient comes in today accompanied by his wife. He has recovered well from his hip replacement. He lives in [place] 10 months out of the year. He belongs to a gym and does a lot of water aerobics and exercises. He denies any chest pain, palpitations, dyspnea, dyspnea on exertion, dizziness or presyncope. His biggest problem is arthritis. He is having to take Aleve twice a day. He has been on Plavix since his CABG.       Past Medical History  Diagnosis Date  . CAD (coronary artery disease)     a. 06/2013 VF Arrest/Cath: 3VD, EF 50%;  b. 06/2013 CABG x 4: LIMA->LAD, VG->PDA->RPL, VG->OM.  [name] Osteoarthritis   . Essential hypertension   . Hyperlipidemia   . Erectile dysfunction   . CVA (cerebral infarction)   . GERD (gastroesophageal reflux disease)   . H/O hiatal hernia   . AAA (abdominal aortic aneurysm) ([place])     a. 05/2014 s/p stent grafting [facility]).  . Stroke ([place])   . HOH (hard of hearing)     wears hearing aid in right ear  . Bronchitis   . Pneumonia     YRS AGO    Past Surgical History  Procedure Laterality Date  . Coronary artery bypass graft N/A 06/27/2013    Procedure: CORONARY ARTERY BYPASS GRAFTING (CABG);  Surgeon: Ivin Poot, MD;  Location: [place];  Service: Open Heart Surgery;  Laterality: N/A;  times four  utilizing the left internal mammary artery and the right greater saphenous vein harvested endoscopically  . Tee without cardioversion N/A 06/27/2013    Procedure: TRANSESOPHAGEAL ECHOCARDIOGRAM (TEE);  Surgeon: Ivin Poot, MD;  Location: [place];  Service: Open Heart Surgery;  Laterality: N/A;  . Endovein harvest of greater saphenous vein Right 06/27/2013    Procedure: ENDOVEIN HARVEST OF GREATER SAPHENOUS VEIN;  Surgeon: Ivin Poot, MD;  Location: [place];  Service: Open Heart Surgery;  Laterality: Right;  . Inner ear surgery Left 1982  . Left heart catheterization with coronary angiogram N/A 06/27/2013    Procedure: LEFT HEART CATHETERIZATION WITH CORONARY ANGIOGRAM;  Surgeon: Sinclair Grooms, MD;  Location: [facility] CATH LAB;  Service: Cardiovascular;  Laterality: N/A;  iabp insertion, cpr, intubation, emergent cvts  . Tonsillectomy    . Inner ear surgery      severed a nerve in his left ear.   . Eye surgery      bilateral cataracts  . Total hip arthroplasty Left 09/01/2014    Procedure: TOTAL HIP ARTHROPLASTY;  Surgeon: Kerin Salen, MD;  Location: [place];  Service: Orthopedics;  Laterality: Left;     Current Outpatient Prescriptions  Medication Sig Dispense Refill  . aspirin EC 325 MG tablet Take 1 tablet (325 mg total) by mouth  2 (two) times daily. 30 tablet 0  . atorvastatin (LIPITOR) 10 MG tablet Take 10 mg by mouth daily.    . Cholecalciferol (VITAMIN D-3) 1000 UNITS CAPS Take 1 capsule by mouth daily.     . folic acid (FOLVITE) [id number] MCG tablet Take 800 mcg by mouth daily.     [name] HYDROcodone-acetaminophen (NORCO/VICODIN) 5-325 MG per tablet Take 1 tablet by mouth every 6 (six) hours as needed for moderate pain. 60 tablet 0  . metoprolol tartrate (LOPRESSOR) 25 MG tablet Take 0.5 tablets (12.5 mg total) by mouth 2 (two) times daily. 90 tablet 3  . Multiple Vitamin (MULTIVITAMIN) tablet Take 1 tablet by mouth daily.    . naproxen sodium (ANAPROX) 220 MG tablet Take 220 mg by mouth 2  (two) times daily with a meal.    . nitroGLYCERIN (NITROSTAT) 0.4 MG SL tablet Place 1 tablet (0.4 mg total) under the tongue every 5 (five) minutes as needed for chest pain. 25 tablet 3  . Omega-3 Fatty Acids (FISH OIL PO) Take 1,000 Units by mouth daily.     . tizanidine (ZANAFLEX) 2 MG capsule Take 1 capsule (2 mg total) by mouth 3 (three) times daily. 60 capsule 0  . traMADol (ULTRAM) 50 MG tablet Take 1 tablet (50 mg total) by mouth every 6 (six) hours as needed for moderate pain. 30 tablet 0   No current facility-administered medications for this visit.    Allergies:   Review of patient's allergies indicates no known allergies.    Social History:  The patient  reports that he has never smoked. He has never used smokeless tobacco. He reports that he drinks alcohol. He reports that he does not use illicit drugs.   Family History:  The patient's family history includes Heart attack in his brother and sister; Heart disease in his brother and sister. There is no history of Colon cancer, Hypertension, or Stroke.    ROS:  Please see the history of present illness.   Otherwise, review of systems are positive for back pain.   All other systems are reviewed and negative.    PHYSICAL EXAM: VS:  BP 112/82 mmHg  Pulse 62  Ht 5\' 10"  (1.778 m)  Wt 224 lb (101.606 kg)  BMI 32.14 kg/m2 , BMI Body mass index is 32.14 kg/(m^2). GEN: Well nourished, well developed, in no acute distress Neck: no JVD, HJR, carotid bruits, or masses Cardiac: RRR; as of S4, 2/6 systolic murmur at the left sternal border, no rubs, thrill or heave,  Respiratory:  clear to auscultation bilaterally, normal work of breathing GI: soft, nontender, nondistended, + BS MS: no deformity or atrophy Extremities: without cyanosis, clubbing, edema, good distal pulses bilaterally.  Skin: warm and dry, no rash Neuro:  Strength and sensation are intact    EKG:  EKG is ordered today. The ekg ordered today demonstrates sinus  rhythm, nonspecific ST-T wave changes inferior lateral, no acute change  Recent Labs: 09/04/2014: Hemoglobin 8.1*; Magnesium 2.5; Platelets 168 09/05/2014: BUN 15; Creatinine, Ser 1.27; Potassium 4.3; Sodium 138    Lipid Panel    Component Value Date/Time   CHOL 137 08/29/2013 0752   TRIG 80.0 08/29/2013 0752   HDL 44.30 08/29/2013 0752   CHOLHDL 3 08/29/2013 0752   VLDL 16.0 08/29/2013 0752   LDLCALC 77 08/29/2013 0752      Wt Readings from Last 3 Encounters:  06/08/15 224 lb (101.606 kg)  09/17/14 224 lb 3.2 oz (101.696 kg)  09/11/14 224 lb  3.2 oz (101.696 kg)      Other studies Reviewed: Additional studies/ records that were reviewed today include and review of the records demonstrates:  ANGIOGRAPHIC DATA:     The left main coronary artery is 95% calcified proximal to ostial left main stenosis..  The left anterior descending artery is 80-90% mid LAD after the first septal perforator.  The left circumflex artery is proximal 80-90% obstruction.  The right coronary artery is ostial 70% with a large distal PDA and 99% stenosis proximal to the origin of a large left ventricular. The patient is right dominant.[name]   LEFT VENTRICULOGRAM:  Left ventricular angiogram was done in the 30 RAO projection and revealed inferoapical hypokinesis. EF 50%   IMPRESSIONS:  1. Severe three-vessel coronary disease including ostial left main demonstrated by catheterization  2. Clinical evidence of left main closure post catheterization exhibited by dramatic ST segment depression on monitor and development of shock.  3. Reversal of shock state with intra-aortic balloon pump, less than 1 minute of CPR, and improved oxygenation. Did not have ventricular arrhythmia.   RECOMMENDATION:  Emergency coronary artery bypass grafting from the Cath Lab straight to the OR.                ASSESSMENT AND PLAN:  CAD (coronary artery disease) Patient with ECABG in 2014. Patient is doing well without  cardiac symptoms. He is exercising regularly. Discussed this patient in detail with Dr. Tamala Julian who says that the patient can come off his Plavix. As he ages his risk of bleeding becomes higher. Continue aspirin 81 mg daily. Follow up in one year.  Essential hypertension Blood Pressure controlled  Hyperlipidemia Due for blood work in January. Managed by primary care.    Sumner Boast, PA-C  06/08/2015 8:58 AM    [facility] [place], [place], [place]  [address] Phone: [phone]; Fax: [phone]

## 2016-03-04 IMAGING — CR DG CHEST 2V
2 series · 2 of 2 positions shown · non-contrast
Comparison: 09/04/2013

CLINICAL DATA: Pre op examination, surgery [REDACTED] AM, hx of SOB and
weakness

EXAM:
CHEST  2 VIEW

[w chest pa]
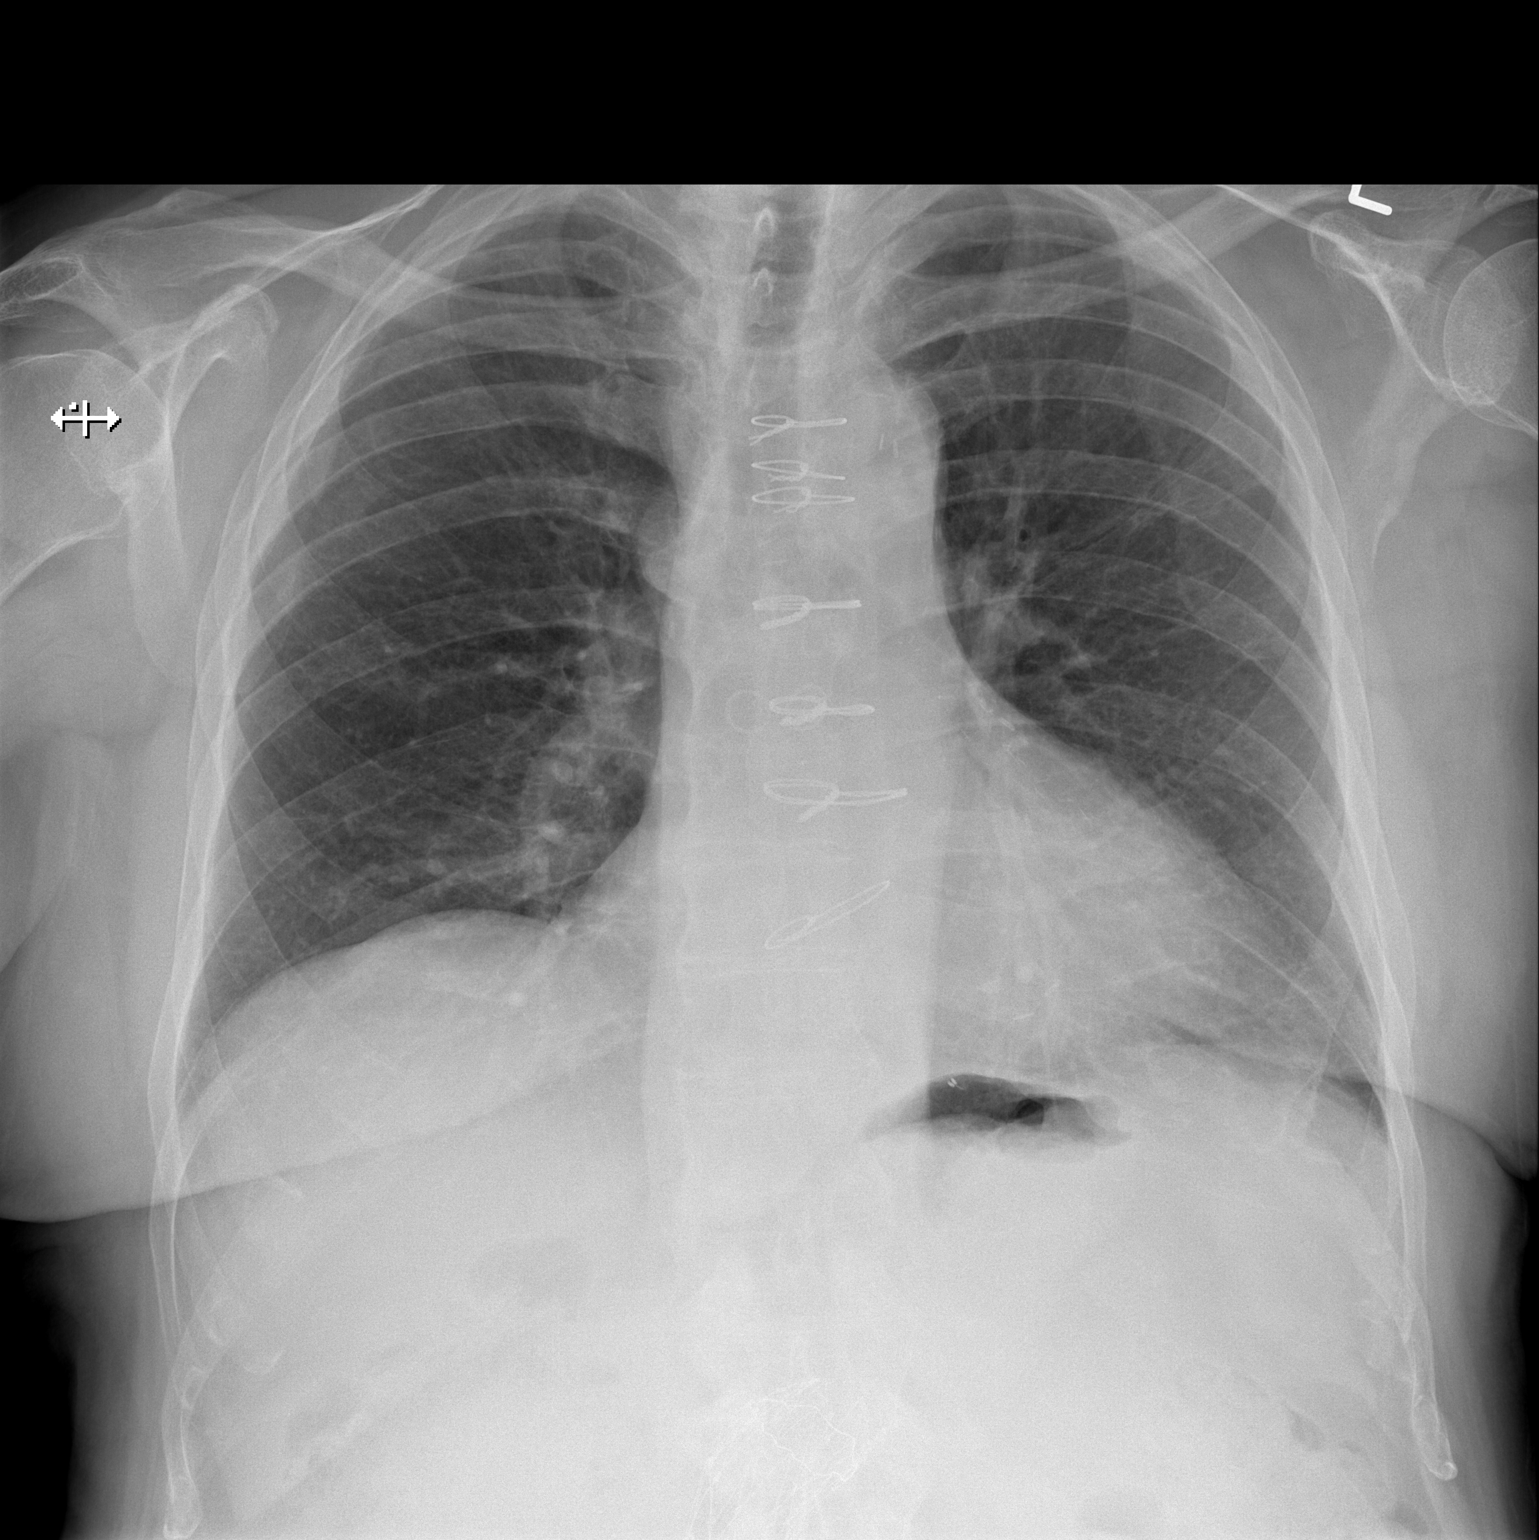

[w chest lat]
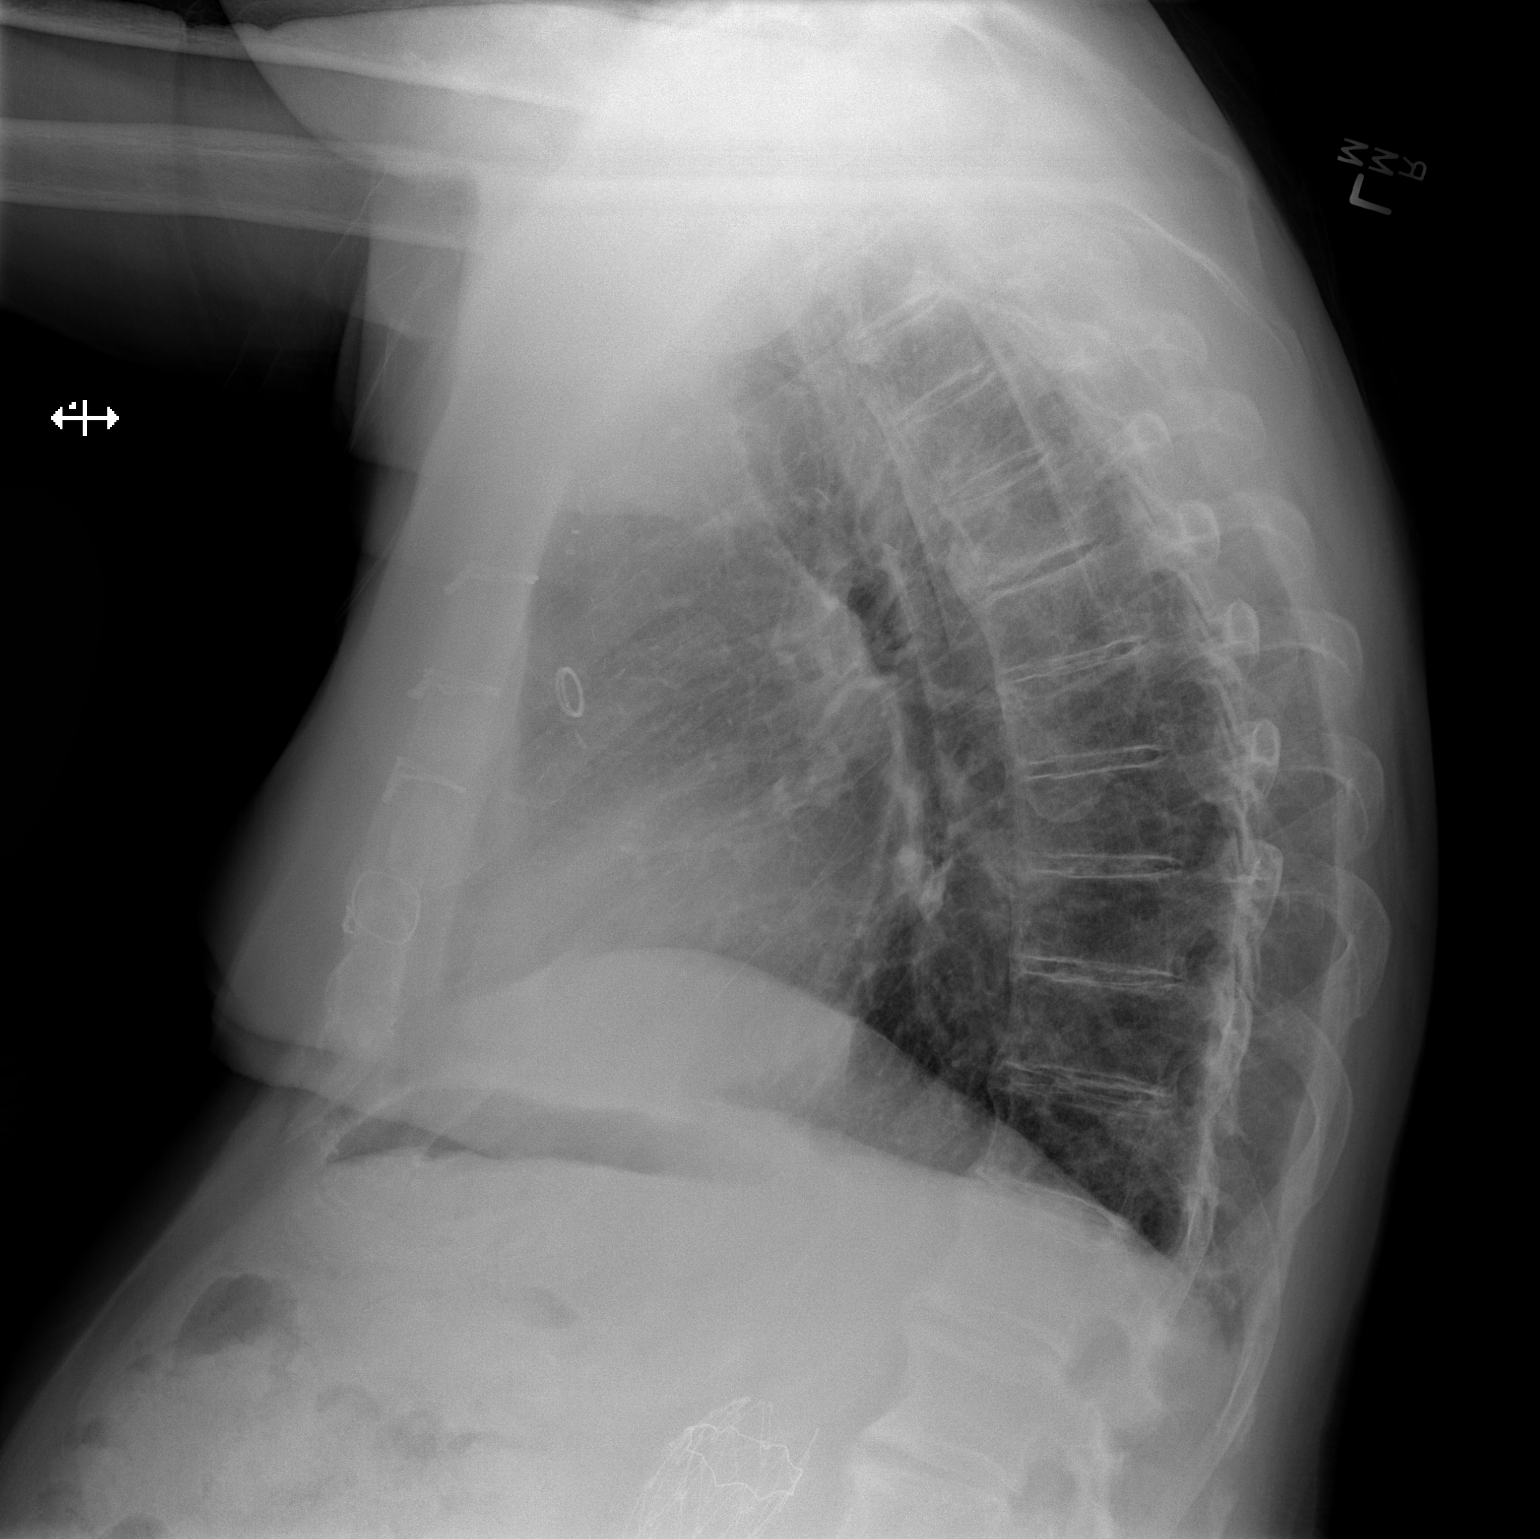

[2 of 2 positions shown; findings below may reference images not displayed]

FINDINGS: Changes from CABG surgery are stable. Cardiac silhouette is normal
in size and configuration. No mediastinal or hilar masses or
adenopathy. Clear lungs. No pleural effusion or pneumothorax.

Bony thorax is demineralized but intact.
IMPRESSION: No acute cardiopulmonary disease.

## 2016-06-23 ENCOUNTER — Encounter: Payer: Self-pay | Admitting: Interventional Cardiology

## 2016-06-23 ENCOUNTER — Ambulatory Visit (INDEPENDENT_AMBULATORY_CARE_PROVIDER_SITE_OTHER): Payer: Medicare Other | Admitting: Interventional Cardiology

## 2016-06-23 VITALS — BP 116/66 | HR 59 | Ht 70.0 in | Wt 219.0 lb

## 2016-06-23 DIAGNOSIS — I10 Essential (primary) hypertension: Secondary | ICD-10-CM

## 2016-06-23 DIAGNOSIS — E785 Hyperlipidemia, unspecified: Secondary | ICD-10-CM | POA: Diagnosis not present

## 2016-06-23 DIAGNOSIS — Z951 Presence of aortocoronary bypass graft: Secondary | ICD-10-CM

## 2016-06-23 DIAGNOSIS — I2510 Atherosclerotic heart disease of native coronary artery without angina pectoris: Secondary | ICD-10-CM | POA: Diagnosis not present

## 2016-06-23 NOTE — Progress Notes (Signed)
Cardiology Office Note    Date:  06/23/2016   ID:  Tyler [name], DOB 1929/04/06, MRN [id number]  PCP:  PROVIDER NOT IN SYSTEM  Cardiologist: Tyler Grooms, MD   Chief Complaint  Patient presents with  . Coronary Artery Disease    History of Present Illness:  Tyler [name] is a 82 y.o. male who presents for yearly follow-up. He has a history of CAD status post ECABG 06/2013 for V. fib arrest and ostial left main stenosis with a LIMA to the LAD, SVG to the PDA/PL 8, SVG to the OM. His EF was 50%. He also has hypertension, hyperlipidemia, and prior stroke. He underwent stent grafting of his AAA and [facility] 05/2014. He most recently underwent left hip replacement in 08/2014.  He denies chest pain. No orthopnea, PND, palpitations, or other complaints. His major problem is back discomfort.    Past Medical History:  Diagnosis Date  . AAA (abdominal aortic aneurysm) ([place])    a. 05/2014 s/p stent grafting [facility]).  . Bronchitis   . CAD (coronary artery disease)    a. 06/2013 VF Arrest/Cath: 3VD, EF 50%;  b. 06/2013 CABG x 4: LIMA->LAD, VG->PDA->RPL, VG->OM.  [name] CVA (cerebral infarction)   . Erectile dysfunction   . Essential hypertension   . GERD (gastroesophageal reflux disease)   . H/O hiatal hernia   . HOH (hard of hearing)    wears hearing aid in right ear  . Hyperlipidemia   . Osteoarthritis   . Pneumonia    YRS AGO  . Stroke [facility])     Past Surgical History:  Procedure Laterality Date  . CORONARY ARTERY BYPASS GRAFT N/A 06/27/2013   Procedure: CORONARY ARTERY BYPASS GRAFTING (CABG);  Surgeon: Tyler Poot, MD;  Location: [place];  Service: Open Heart Surgery;  Laterality: N/A;  times four utilizing the left internal mammary artery and the right greater saphenous vein harvested endoscopically  . ENDOVEIN HARVEST OF GREATER SAPHENOUS VEIN Right 06/27/2013   Procedure: ENDOVEIN HARVEST OF GREATER SAPHENOUS VEIN;  Surgeon: Tyler Poot, MD;  Location: [place];  Service: Open Heart Surgery;  Laterality: Right;  . EYE SURGERY     bilateral cataracts  . INNER EAR SURGERY Left 1982  . INNER EAR SURGERY     severed a nerve in his left ear.   [name] LEFT HEART CATHETERIZATION WITH CORONARY ANGIOGRAM N/A 06/27/2013   Procedure: LEFT HEART CATHETERIZATION WITH CORONARY ANGIOGRAM;  Surgeon: Tyler Grooms, MD;  Location: [facility] CATH LAB;  Service: Cardiovascular;  Laterality: N/A;  iabp insertion, cpr, intubation, emergent cvts  . TEE WITHOUT CARDIOVERSION N/A 06/27/2013   Procedure: TRANSESOPHAGEAL ECHOCARDIOGRAM (TEE);  Surgeon: Tyler Poot, MD;  Location: [place];  Service: Open Heart Surgery;  Laterality: N/A;  . TONSILLECTOMY    . TOTAL HIP ARTHROPLASTY Left 09/01/2014   Procedure: TOTAL HIP ARTHROPLASTY;  Surgeon: Tyler Salen, MD;  Location: [place];  Service: Orthopedics;  Laterality: Left;    Current Medications: Outpatient Medications Prior to Visit  Medication Sig Dispense Refill  . atorvastatin (LIPITOR) 10 MG tablet Take 10 mg by mouth daily.    . metoprolol tartrate (LOPRESSOR) 25 MG tablet Take 0.5 tablets (12.5 mg total) by mouth 2 (two) times daily. 90 tablet 3  . nitroGLYCERIN (NITROSTAT) 0.4 MG SL tablet Place 1 tablet (0.4 mg total) under the tongue every 5 (five) minutes as needed for chest pain. 25 tablet 3  . aspirin EC 325  MG tablet Take 1 tablet (325 mg total) by mouth 2 (two) times daily. (Patient not taking: Reported on 06/23/2016) 30 tablet 0  . Cholecalciferol (VITAMIN D-3) 1000 UNITS CAPS Take 1 capsule by mouth daily.     . folic acid (FOLVITE) Q000111Q MCG tablet Take 800 mcg by mouth daily.     [name] HYDROcodone-acetaminophen (NORCO/VICODIN) 5-325 MG per tablet Take 1 tablet by mouth every 6 (six) hours as needed for moderate pain. (Patient not taking: Reported on 06/23/2016) 60 tablet 0  . Multiple Vitamin (MULTIVITAMIN) tablet Take 1 tablet by mouth daily.    . naproxen sodium (ANAPROX) 220 MG tablet Take 220 mg by mouth 2 (two) times  daily with a meal.    . Omega-3 Fatty Acids (FISH OIL PO) Take 1,000 Units by mouth daily.     . tizanidine (ZANAFLEX) 2 MG capsule Take 1 capsule (2 mg total) by mouth 3 (three) times daily. (Patient not taking: Reported on 06/23/2016) 60 capsule 0  . traMADol (ULTRAM) 50 MG tablet Take 1 tablet (50 mg total) by mouth every 6 (six) hours as needed for moderate pain. (Patient not taking: Reported on 06/23/2016) 30 tablet 0   No facility-administered medications prior to visit.      Allergies:   Patient has no known allergies.   Social History   Social History  . Marital status: Married    Spouse name: N/A  . Number of children: N/A  . Years of education: N/A   Social History Main Topics  . Smoking status: Never Smoker  . Smokeless tobacco: Never Used  . Alcohol use Yes     Comment: Rare  . Drug use: No  . Sexual activity: Not Asked   Other Topics Concern  . None   Social History Narrative  . None     Family History:  The patient's family history includes Heart attack in his brother and sister; Heart disease in his brother and sister.   ROS:   Please see the history of present illness.    Low back pain, hearing loss, otherwise okay except for some memory loss.  All other systems reviewed and are negative.   PHYSICAL EXAM:   VS:  BP 116/66 (BP Location: Right Arm)   Pulse (!) 59   Ht 5\' 10"  (1.778 m)   Wt 219 lb (99.3 kg)   BMI 31.42 kg/m    GEN: Well nourished, well developed, in no acute distress . Marked obesity. HEENT: normal  Neck: no JVD, carotid bruits, or masses Cardiac: RRR; no murmurs, rubs, or gallops,no edema  Respiratory:  clear to auscultation bilaterally, normal work of breathing GI: soft, nontender, nondistended, + BS MS: no deformity or atrophy  Skin: warm and dry, no rash Neuro:  Alert and Oriented x 3, Strength and sensation are intact Psych: euthymic mood, full affect  Wt Readings from Last 3 Encounters:  06/23/16 219 lb (99.3 kg)    06/08/15 224 lb (101.6 kg)  09/17/14 224 lb 3.2 oz (101.7 kg)      Studies/Labs Reviewed:   EKG:  EKG  Normal sinus rhythm, nonspecific ST abnormality, no change compared to prior tracing. Evidence of old anterior infarct. Unchanged from prior.  Recent Labs: No results found for requested labs within last 8760 hours.   Lipid Panel    Component Value Date/Time   CHOL 137 08/29/2013 0752   TRIG 80.0 08/29/2013 0752   HDL 44.30 08/29/2013 0752   CHOLHDL 3 08/29/2013 0752  VLDL 16.0 08/29/2013 0752   LDLCALC 77 08/29/2013 0752    Additional studies/ records that were reviewed today include:  No new data    ASSESSMENT:    1. Coronary artery disease involving native coronary artery of native heart without angina pectoris   2. Essential hypertension   3. S/P CABG x 4   4. Hyperlipidemia, unspecified hyperlipidemia type      PLAN:  In order of problems listed above:  1. Stable without recurrent angina following bypass surgery in 2015. 2. Excellent control. 3. Presumed still effective. 4. Followed by his primary care. Continue current therapy.  Overall plan aerobic activity to keep weight down. He should call if angina. One-year follow-up.    Medication Adjustments/Labs and Tests Ordered: Current medicines are reviewed at length with the patient today.  Concerns regarding medicines are outlined above.  Medication changes, Labs and Tests ordered today are listed in the Patient Instructions below. There are no Patient Instructions on file for this visit.   Signed, Tyler Grooms, MD  06/23/2016 12:42 PM    [facility] [address], [place], [place]  [address] Phone: [phone]; Fax: [phone]

## 2016-06-23 NOTE — Patient Instructions (Signed)

## 2017-07-05 ENCOUNTER — Ambulatory Visit: Payer: Medicare Other | Admitting: Interventional Cardiology

## 2017-07-10 NOTE — Progress Notes (Signed)
Cardiology Office Note    Date:  07/11/2017   ID:  Tyler [name], DOB 30-Jan-1929, MRN [id number]  PCP:  System, Provider Not In  Cardiologist: Sinclair Grooms, MD   Chief Complaint  Patient presents with  . Coronary Artery Disease    History of Present Illness:  Tyler [name] is a 83 y.o. male who presents for yearly follow-up. He has a history of CAD status post ECABG 06/2013 for V. fib arrest and ostial left main stenosis with a LIMA to the LAD, SVG to the PDA/PL 8, SVG to the OM. His EF was 50%. He also has hypertension, hyperlipidemia, and prior stroke. He underwent stent grafting of his AAA and [facility] 05/2014. He most recently underwent left hip replacement in 08/2014.  He is doing relatively well since coronary bypass surgery in December 2014.  He is also status post left hip replacement surgery in 2016.  Denies chest pain, orthopnea, PND.  Difficulty with balance.  Difficulty ambulating.  No nitroglycerin use.   Past Medical History:  Diagnosis Date  . AAA (abdominal aortic aneurysm) ([place])    a. 05/2014 s/p stent grafting [facility]).  . Bronchitis   . CAD (coronary artery disease)    a. 06/2013 VF Arrest/Cath: 3VD, EF 50%;  b. 06/2013 CABG x 4: LIMA->LAD, VG->PDA->RPL, VG->OM.  [name] CVA (cerebral infarction)   . Erectile dysfunction   . Essential hypertension   . GERD (gastroesophageal reflux disease)   . H/O hiatal hernia   . HOH (hard of hearing)    wears hearing aid in right ear  . Hyperlipidemia   . Osteoarthritis   . Pneumonia    YRS AGO  . Stroke [facility])     Past Surgical History:  Procedure Laterality Date  . CORONARY ARTERY BYPASS GRAFT N/A 06/27/2013   Procedure: CORONARY ARTERY BYPASS GRAFTING (CABG);  Surgeon: Ivin Poot, MD;  Location: [place];  Service: Open Heart Surgery;  Laterality: N/A;  times four utilizing the left internal mammary artery and the right greater saphenous vein harvested endoscopically  . ENDOVEIN HARVEST OF GREATER  SAPHENOUS VEIN Right 06/27/2013   Procedure: ENDOVEIN HARVEST OF GREATER SAPHENOUS VEIN;  Surgeon: Ivin Poot, MD;  Location: [place];  Service: Open Heart Surgery;  Laterality: Right;  . EYE SURGERY     bilateral cataracts  . INNER EAR SURGERY Left 1982  . INNER EAR SURGERY     severed a nerve in his left ear.   [name] LEFT HEART CATHETERIZATION WITH CORONARY ANGIOGRAM N/A 06/27/2013   Procedure: LEFT HEART CATHETERIZATION WITH CORONARY ANGIOGRAM;  Surgeon: Sinclair Grooms, MD;  Location[facility] CATH LAB;  Service: Cardiovascular;  Laterality: N/A;  iabp insertion, cpr, intubation, emergent cvts  . TEE WITHOUT CARDIOVERSION N/A 06/27/2013   Procedure: TRANSESOPHAGEAL ECHOCARDIOGRAM (TEE);  Surgeon: Ivin Poot, MD;  Location: [place];  Service: Open Heart Surgery;  Laterality: N/A;  . TONSILLECTOMY    . TOTAL HIP ARTHROPLASTY Left 09/01/2014   Procedure: TOTAL HIP ARTHROPLASTY;  Surgeon: Kerin Salen, MD;  Location: [place];  Service: Orthopedics;  Laterality: Left;    Current Medications: Outpatient Medications Prior to Visit  Medication Sig Dispense Refill  . aspirin 81 MG EC tablet Take 81 mg by mouth daily. Swallow whole.    [name] atorvastatin (LIPITOR) 10 MG tablet Take 10 mg by mouth daily.    [name] lisinopril (PRINIVIL,ZESTRIL) 5 MG tablet Take 5 mg by mouth daily.    [name]  metoprolol tartrate (LOPRESSOR) 25 MG tablet Take 0.5 tablets (12.5 mg total) by mouth 2 (two) times daily. 90 tablet 3  . nitroGLYCERIN (NITROSTAT) 0.4 MG SL tablet Place 1 tablet (0.4 mg total) under the tongue every 5 (five) minutes as needed for chest pain. 25 tablet 3  . terazosin (HYTRIN) 5 MG capsule Take 5 mg by mouth at bedtime.     No facility-administered medications prior to visit.      Allergies:   Patient has no known allergies.   Social History   Socioeconomic History  . Marital status: Married    Spouse name: None  . Number of children: None  . Years of education: None  . Highest education level: None    Social Needs  . Financial resource strain: None  . Food insecurity - worry: None  . Food insecurity - inability: None  . Transportation needs - medical: None  . Transportation needs - non-medical: None  Occupational History  . None  Tobacco Use  . Smoking status: Never Smoker  . Smokeless tobacco: Never Used  Substance and Sexual Activity  . Alcohol use: Yes    Comment: Rare  . Drug use: No  . Sexual activity: None  Other Topics Concern  . None  Social History Narrative  . None     Family History:  The patient's family history includes Heart attack in his brother and sister; Heart disease in his brother and sister.   ROS:   Please see the history of present illness.    Back discomfort and difficulty with ambulation.  Decreased memory. All other systems reviewed and are negative.   PHYSICAL EXAM:   VS:  BP 108/60   Pulse 74   Ht 5\' 10"  (1.778 m)   Wt 224 lb 12.8 oz (102 kg)   BMI 32.26 kg/m    GEN: Well nourished, well developed, in no acute distress.  Elderly. HEENT: normal  Neck: no JVD, carotid bruits, or masses Cardiac: RRR; no murmurs, rubs, or gallops,no edema  Respiratory:  clear to auscultation bilaterally, normal work of breathing GI: soft, nontender, nondistended, + BS MS: no deformity or atrophy.  Impaired gait due to prior hip replacement. Skin: warm and dry, no rash Neuro:  Alert and Oriented x 3, Strength and sensation are intact Psych: euthymic mood, full affect  Wt Readings from Last 3 Encounters:  07/11/17 224 lb 12.8 oz (102 kg)  06/23/16 219 lb (99.3 kg)  06/08/15 224 lb (101.6 kg)      Studies/Labs Reviewed:   EKG:  EKG sinus rhythm with nonspecific T wave abnormality 2 3 aVF and V4 through V6.  No significant change when compared to prior.  Recent Labs: No results found for requested labs within last 8760 hours.   Lipid Panel    Component Value Date/Time   CHOL 137 08/29/2013 0752   TRIG 80.0 08/29/2013 0752   HDL 44.30  08/29/2013 0752   CHOLHDL 3 08/29/2013 0752   VLDL 16.0 08/29/2013 0752   LDLCALC 77 08/29/2013 0752    Additional studies/ records that were reviewed today include:  No new data.  All laboratory data is done in [place] by primary care. Abdominal aortic aneurysm ultrasound performed April 09, 2014: 5 cm x 5.1 cm x 4.7 cm.   ASSESSMENT:    1. Coronary artery disease involving native coronary artery of native heart without angina pectoris   2. Abdominal aortic aneurysm (AAA) without rupture ([place])   3. Essential hypertension  4. S/P CABG x 4   5. 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 Patient is doing well.  Does not have angina.  Denies dyspnea. 2. Not assessed in several years.  Need to have a repeat abdominal aortic ultrasound. 3. Adequate control. 4. Stable status post bypass surgery in 2014.   We will contact patient and have an abdominal ultrasound done to reassess aortic aneurysm.  5.5 cm would be a metric for consideration of stent graft.    Medication Adjustments/Labs and Tests Ordered: Current medicines are reviewed at length with the patient today.  Concerns regarding medicines are outlined above.  Medication changes, Labs and Tests ordered today are listed in the Patient Instructions below. Patient Instructions  Medication Instructions:  Your physician recommends that you continue on your current medications as directed. Please refer to the Current Medication list given to you today.  Labwork: None  Testing/Procedures: None  Follow-Up: Your physician wants you to follow-up in: 1 year with Dr. Tamala Julian.  You will receive a reminder letter in the mail two months in advance. If you don't receive a letter, please call our office to schedule the follow-up appointment.   Any Other Special Instructions Will Be Listed Below (If Applicable).     If  you need a refill on your cardiac medications before your next appointment, please call your pharmacy.      Signed, Sinclair Grooms, MD  07/11/2017 4:05 PM    [facility] [address], [place], [place]  [address] Phone: [phone]; Fax: [phone]

## 2017-07-11 ENCOUNTER — Encounter: Payer: Self-pay | Admitting: Interventional Cardiology

## 2017-07-11 ENCOUNTER — Ambulatory Visit (INDEPENDENT_AMBULATORY_CARE_PROVIDER_SITE_OTHER): Payer: Medicare Other | Admitting: Interventional Cardiology

## 2017-07-11 VITALS — BP 108/60 | HR 74 | Ht 70.0 in | Wt 224.8 lb

## 2017-07-11 DIAGNOSIS — I63239 Cerebral infarction due to unspecified occlusion or stenosis of unspecified carotid arteries: Secondary | ICD-10-CM

## 2017-07-11 DIAGNOSIS — I714 Abdominal aortic aneurysm, without rupture, unspecified: Secondary | ICD-10-CM

## 2017-07-11 DIAGNOSIS — I2510 Atherosclerotic heart disease of native coronary artery without angina pectoris: Secondary | ICD-10-CM

## 2017-07-11 DIAGNOSIS — Z951 Presence of aortocoronary bypass graft: Secondary | ICD-10-CM | POA: Diagnosis not present

## 2017-07-11 DIAGNOSIS — I10 Essential (primary) hypertension: Secondary | ICD-10-CM | POA: Diagnosis not present

## 2017-07-11 NOTE — Patient Instructions (Signed)

## 2017-07-11 NOTE — Addendum Note (Signed)
Addended by: Loren Racer on: 07/11/2017 04:15 PM   Modules accepted: Orders

## 2017-07-27 ENCOUNTER — Ambulatory Visit (HOSPITAL_COMMUNITY)
Admission: RE | Admit: 2017-07-27 | Discharge: 2017-07-27 | Disposition: A | Discharge status: Home / Self Care | Payer: Medicare Other | Source: Ambulatory Visit | Attending: Cardiovascular Disease | Admitting: Cardiovascular Disease

## 2017-07-27 DIAGNOSIS — I723 Aneurysm of iliac artery: Secondary | ICD-10-CM | POA: Diagnosis not present

## 2017-07-27 DIAGNOSIS — I714 Abdominal aortic aneurysm, without rupture, unspecified: Secondary | ICD-10-CM

## 2017-07-31 ENCOUNTER — Other Ambulatory Visit: Payer: Self-pay | Admitting: Interventional Cardiology

## 2018-08-09 ENCOUNTER — Encounter: Payer: Self-pay | Admitting: Interventional Cardiology

## 2018-08-26 NOTE — Progress Notes (Signed)
Cardiology Office Note:    Date:  08/27/2018   ID:  Tyler [name], DOB Apr 30, 1929, MRN [id number]  PCP:  System, Provider Not In  Cardiologist:  Sinclair Grooms, MD   Referring MD: No ref. provider found   Chief Complaint  Patient presents with  . Coronary Artery Disease    History of Present Illness:    Tyler [name] is a 85 y.o. male with a hx of CAD status post ECABG 06/2013 for V. fib arrest and ostial left main stenosis with a LIMA to the LAD, SVG to the PDA/PL 8, SVG to the OM. His EF was 50%. He also has hypertension, hyperlipidemia, and prior stroke. He underwent stent grafting of his AAA and [facility] 05/2014. He most recently underwent left hip replacement in 08/2014.  Tyler [name] is doing relatively well.  Not much physical activity because of significant back discomfort.  This prevents much walking.  He is not having chest pain, angina, or associated dyspnea.  He does have bilateral lower extremity swelling that he does not recognize.  He denies orthopnea.  He has not had syncope or falls.  No neurological complaints, prolonged palpitations, or other cardiac symptoms.  Past Medical History:  Diagnosis Date  . AAA (abdominal aortic aneurysm) ([place])    a. 05/2014 s/p stent grafting [facility]).  . Bronchitis   . CAD (coronary artery disease)    a. 06/2013 VF Arrest/Cath: 3VD, EF 50%;  b. 06/2013 CABG x 4: LIMA->LAD, VG->PDA->RPL, VG->OM.  [name] CVA (cerebral infarction)   . Erectile dysfunction   . Essential hypertension   . GERD (gastroesophageal reflux disease)   . H/O hiatal hernia   . HOH (hard of hearing)    wears hearing aid in right ear  . Hyperlipidemia   . Osteoarthritis   . Pneumonia    YRS AGO  . Stroke [facility])     Past Surgical History:  Procedure Laterality Date  . CORONARY ARTERY BYPASS GRAFT N/A 06/27/2013   Procedure: CORONARY ARTERY BYPASS GRAFTING (CABG);  Surgeon: Ivin Poot, MD;  Location: [place];  Service: Open Heart Surgery;  Laterality:  N/A;  times four utilizing the left internal mammary artery and the right greater saphenous vein harvested endoscopically  . ENDOVEIN HARVEST OF GREATER SAPHENOUS VEIN Right 06/27/2013   Procedure: ENDOVEIN HARVEST OF GREATER SAPHENOUS VEIN;  Surgeon: Ivin Poot, MD;  Location: [place];  Service: Open Heart Surgery;  Laterality: Right;  . EYE SURGERY     bilateral cataracts  . INNER EAR SURGERY Left 1982  . INNER EAR SURGERY     severed a nerve in his left ear.   [name] LEFT HEART CATHETERIZATION WITH CORONARY ANGIOGRAM N/A 06/27/2013   Procedure: LEFT HEART CATHETERIZATION WITH CORONARY ANGIOGRAM;  Surgeon: Sinclair Grooms, MD;  Location: [facility] CATH LAB;  Service: Cardiovascular;  Laterality: N/A;  iabp insertion, cpr, intubation, emergent cvts  . TEE WITHOUT CARDIOVERSION N/A 06/27/2013   Procedure: TRANSESOPHAGEAL ECHOCARDIOGRAM (TEE);  Surgeon: Ivin Poot, MD;  Location[place];  Service: Open Heart Surgery;  Laterality: N/A;  . TONSILLECTOMY    . TOTAL HIP ARTHROPLASTY Left 09/01/2014   Procedure: TOTAL HIP ARTHROPLASTY;  Surgeon: Kerin Salen, MD;  Location: [place];  Service: Orthopedics;  Laterality: Left;    Current Medications: Current Meds  Medication Sig  . aspirin 81 MG EC tablet Take 81 mg by mouth daily. Swallow whole.  [name] atorvastatin (LIPITOR) 10 MG tablet Take 10 mg by  mouth daily.  [name] lisinopril (PRINIVIL,ZESTRIL) 5 MG tablet Take 5 mg by mouth daily.  . metoprolol tartrate (LOPRESSOR) 25 MG tablet Take 0.5 tablets (12.5 mg total) by mouth 2 (two) times daily.  . nitroGLYCERIN (NITROSTAT) 0.4 MG SL tablet Place 1 tablet (0.4 mg total) under the tongue every 5 (five) minutes as needed for chest pain.  [name] terazosin (HYTRIN) 5 MG capsule Take 5 mg by mouth at bedtime.     Allergies:   Patient has no known allergies.   Social History   Socioeconomic History  . Marital status: Married    Spouse name: Not on file  . Number of children: Not on file  . Years of education: Not  on file  . Highest education level: Not on file  Occupational History  . Not on file  Social Needs  . Financial resource strain: Not on file  . Food insecurity:    Worry: Not on file    Inability: Not on file  . Transportation needs:    Medical: Not on file    Non-medical: Not on file  Tobacco Use  . Smoking status: Never Smoker  . Smokeless tobacco: Never Used  Substance and Sexual Activity  . Alcohol use: Yes    Comment: Rare  . Drug use: No  . Sexual activity: Not on file  Lifestyle  . Physical activity:    Days per week: Not on file    Minutes per session: Not on file  . Stress: Not on file  Relationships  . Social connections:    Talks on phone: Not on file    Gets together: Not on file    Attends religious service: Not on file    Active member of club or organization: Not on file    Attends meetings of clubs or organizations: Not on file    Relationship status: Not on file  Other Topics Concern  . Not on file  Social History Narrative  . Not on file     Family History: The patient's family history includes Heart attack in his brother and sister; Heart disease in his brother and sister. There is no history of Colon cancer, Hypertension, or Stroke.  ROS:   Please see the history of present illness.    Abdominal aortic aneurysm.  He has not not been back for follow-up.  Has history of stent graft done 2016 near [place].  Chronic back discomfort that is near debilitating.  All other systems reviewed and are negative.  EKGs/Labs/Other Studies Reviewed:    The following studies were reviewed today: No new or recent imaging data or functional data.  EKG:  EKG performed on August 27, 2018 demonstrates nonspecific T wave abnormality, poor R wave progression, normal PR interval.  No change when compared to prior.  The current tracing is compared to 06/2017.  Recent Labs: No results found for requested labs within last 8760 hours.  Recent Lipid Panel      Component Value Date/Time   CHOL 137 08/29/2013 0752   TRIG 80.0 08/29/2013 0752   HDL 44.30 08/29/2013 0752   CHOLHDL 3 08/29/2013 0752   VLDL 16.0 08/29/2013 0752   LDLCALC 77 08/29/2013 0752    Physical Exam:    VS:  BP 106/64   Pulse 67   Ht 5\' 10"  (1.778 m)   Wt 227 lb 12.8 oz (103.3 kg)   SpO2 97%   BMI 32.69 kg/m     Wt Readings from Last 3 Encounters:  08/27/18 227 lb 12.8 oz (103.3 kg)  07/11/17 224 lb 12.8 oz (102 kg)  06/23/16 219 lb (99.3 kg)     GEN: Somewhat pale, obese for his age.. No acute distress HEENT: Normal NECK: No JVD. LYMPHATICS: No lymphadenopathy CARDIAC: RRR.  No murmur, no gallop, 1-2+ bilateral ankle to knee edema VASCULAR: 2+ bilateral radial and carotid pulses, no bruits RESPIRATORY:  Clear to auscultation without rales, wheezing or rhonchi  ABDOMEN: Soft, non-tender, non-distended, No pulsatile mass, MUSCULOSKELETAL: No deformity  SKIN: Warm and dry NEUROLOGIC:  Alert and oriented x 3 PSYCHIATRIC:  Normal affect   ASSESSMENT:    1. S/P CABG x 4   2. Cerebral infarction due to thrombosis of precerebral artery ([place])   3. Mixed hyperlipidemia   4. Essential hypertension   5. Coronary artery disease involving native coronary artery of native heart without angina pectoris   6. Abdominal aortic aneurysm (AAA) without rupture (H[facility])    PLAN:    In order of problems listed above:  1. No angina.  Aggressive secondary risk prevention is discussed. 2. No new neurological complaints. 3. LDL target should be less than 70.  No recent blood work.  This will be done by primary care doctor [place] in [place].  Last data available to be reviewed is from 2018. 4. Blood pressure is adequately controlled and perhaps relatively to well controlled for his age at 938 mmHg systolic.  He is on agents that could influence blood pressure including Hytrin, Lopressor, Prinivil. 5. Abdominal aortic aneurysm, not currently being followed.  He is status post  stent graft.  Not currently fasting, planning to head back to the [place] area this afternoon.  Unable to draw blood work.  I recommend that the follow-up be done closer to home.  Plan to continue current medical regimen but consider discontinuation of ACE inhibitor therapy.  Continue low-dose beta-blocker therapy.  Needs to visit his vascular surgeon to have follow-up on stent graft.  Follow-up in 1 year.   Medication Adjustments/Labs and Tests Ordered: Current medicines are reviewed at length with the patient today.  Concerns regarding medicines are outlined above.  Orders Placed This Encounter  Procedures  . EKG 12-Lead   No orders of the defined types were placed in this encounter.   Patient Instructions  Medication Instructions:  Your physician recommends that you continue on your current medications as directed. Please refer to the Current Medication list given to you today.  If you need a refill on your cardiac medications before your next appointment, please call your pharmacy.   Lab work: None Ordered    Testing/Procedures: None Ordered   Follow-Up: At [facility], you and your health needs are our priority.  As part of our continuing mission to provide you with exceptional heart care, we have created designated Provider Care Teams.  These Care Teams include your primary Cardiologist (physician) and Advanced Practice Providers (APPs -  Physician Assistants and Nurse Practitioners) who all work together to provide you with the care you need, when you need it. You will need a follow up appointment in 1 years.  Please call our office 2 months in advance to schedule this appointment.  You may see Sinclair Grooms, MD  or one of the following Advanced Practice Providers on your designated Care Team:   Truitt Merle, NP Cecilie Kicks, NP . Kathyrn Drown, NP      Signed, Sinclair Grooms, MD  08/27/2018 4:51 PM  [facility]

## 2018-08-27 ENCOUNTER — Ambulatory Visit (INDEPENDENT_AMBULATORY_CARE_PROVIDER_SITE_OTHER): Payer: Medicare Other | Admitting: Interventional Cardiology

## 2018-08-27 ENCOUNTER — Encounter: Payer: Self-pay | Admitting: Interventional Cardiology

## 2018-08-27 VITALS — BP 106/64 | HR 67 | Ht 70.0 in | Wt 227.8 lb

## 2018-08-27 DIAGNOSIS — I6300 Cerebral infarction due to thrombosis of unspecified precerebral artery: Secondary | ICD-10-CM

## 2018-08-27 DIAGNOSIS — I10 Essential (primary) hypertension: Secondary | ICD-10-CM

## 2018-08-27 DIAGNOSIS — I714 Abdominal aortic aneurysm, without rupture, unspecified: Secondary | ICD-10-CM

## 2018-08-27 DIAGNOSIS — E782 Mixed hyperlipidemia: Secondary | ICD-10-CM | POA: Diagnosis not present

## 2018-08-27 DIAGNOSIS — Z951 Presence of aortocoronary bypass graft: Secondary | ICD-10-CM | POA: Diagnosis not present

## 2018-08-27 NOTE — Patient Instructions (Signed)
Medication Instructions:  Your physician recommends that you continue on your current medications as directed. Please refer to the Current Medication list given to you today.  If you need a refill on your cardiac medications before your next appointment, please call your pharmacy.   Lab work: None Ordered    Testing/Procedures: None Ordered   Follow-Up: At [facility], you and your health needs are our priority.  As part of our continuing mission to provide you with exceptional heart care, we have created designated Provider Care Teams.  These Care Teams include your primary Cardiologist (physician) and Advanced Practice Providers (APPs -  Physician Assistants and Nurse Practitioners) who all work together to provide you with the care you need, when you need it. You will need a follow up appointment in 1 years.  Please call our office 2 months in advance to schedule this appointment.  You may see Sinclair Grooms, MD  or one of the following Advanced Practice Providers on your designated Care Team:   Truitt Merle, NP Cecilie Kicks, NP . Kathyrn Drown, NP

## 2018-12-10 NOTE — Progress Notes (Signed)
Virtual Visit via Telephone Note   This visit type was conducted due to national recommendations for restrictions regarding the COVID-19 Pandemic (e.g. social distancing) in an effort to limit this patient's exposure and mitigate transmission in our community.  Due to his co-morbid illnesses, this patient is at least at moderate risk for complications without adequate follow up.  This format is felt to be most appropriate for this patient at this time.  The patient did not have access to video technology/had technical difficulties with video requiring transitioning to audio format only (telephone).  All issues noted in this document were discussed and addressed.  No physical exam could be performed with this format.  Please refer to the patient's chart for his  consent to telehealth for [facility].   Date:  12/14/2018   ID:  [name], DOB [birth year], MRN [id number]  Patient Location: Home Provider Location: Home  PCP:  System, Provider Not In  Cardiologist:  Sinclair Grooms, MD   Evaluation Performed:  Follow-Up Visit  Chief Complaint: Hospital follow up, seen for Dr. Tamala Julian  History of Present Illness:    Tyler [name] is a 85 y.o. male with a history of CAD s/p CABG 06/2013 for Vfib arrest found to have ostial LM stenosis with LIMA to LAD, SVG to PDA/PL and SVG to OM.  Also with a history of hypertension, hyperlipidemia, CVA, and stent grafting of AAA in [place], [place] in 05/2014.   Tyler [name] was last seen by Dr. Tamala Julian 08/27/2018 in follow-up and was noted to be doing relatively well.  He was having significant back discomfort and therefore was not very physically active.  He had no chest pain or other anginal symptoms.  He was noted to have BLE however denied orthopnea  Today he presents for hospital follow up.  He spends much of his time split between [facility] and here.  Apparently, last month he had a mechanical fall in which he was sitting in a chair  and tripped over his foot while standing.  He fell, landing on top of his ankle.  He was seen at a [facility] in which it was found he had a fracture.  He underwent surgery with pinning and plating.  He is now back in [place] and is staying at his home with his wife and assistance from a CMA.  His plans are to stay here for the majority of his time.  He states that once he is healed from his ankle surgery, he is to undergo a hip replacement.  From a cardiac perspective, he denies anginal symptoms, shortness of breath, LE swelling, dizziness or syncope.  He has been compliant with his medications.  His physical activity continues to be limited secondary to his orthopedic issues.  BP is well controlled today.  He is needing assistance with finding a primary care provider here in the area.  Apparently after hospital discharge, they wanted him to see someone here in [place] however he does not currently have a PCP.  We will help him with this.  Otherwise, he has no specific complaints today.    The patient does not have symptoms concerning for COVID-19 infection (fever, chills, cough, or new shortness of breath).   Past Medical History:  Diagnosis Date  . AAA (abdominal aortic aneurysm) ([place])    a. 05/2014 s/p stent grafting [facility]).  . Bronchitis   . CAD (coronary artery disease)    a. 06/2013 VF Arrest/Cath:  3VD, EF 50%;  b. 06/2013 CABG x 4: LIMA->LAD, VG->PDA->RPL, VG->OM.  [name] CVA (cerebral infarction)   . Erectile dysfunction   . Essential hypertension   . GERD (gastroesophageal reflux disease)   . H/O hiatal hernia   . HOH (hard of hearing)    wears hearing aid in right ear  . Hyperlipidemia   . Osteoarthritis   . Pneumonia    YRS AGO  . Stroke [facility])    Past Surgical History:  Procedure Laterality Date  . CORONARY ARTERY BYPASS GRAFT N/A 06/27/2013   Procedure: CORONARY ARTERY BYPASS GRAFTING (CABG);  Surgeon: Ivin Poot, MD;  Location: [place];  Service: Open  Heart Surgery;  Laterality: N/A;  times four utilizing the left internal mammary artery and the right greater saphenous vein harvested endoscopically  . ENDOVEIN HARVEST OF GREATER SAPHENOUS VEIN Right 06/27/2013   Procedure: ENDOVEIN HARVEST OF GREATER SAPHENOUS VEIN;  Surgeon: Ivin Poot, MD;  Location: [place];  Service: Open Heart Surgery;  Laterality: Right;  . EYE SURGERY     bilateral cataracts  . INNER EAR SURGERY Left 1982  . INNER EAR SURGERY     severed a nerve in his left ear.   [name] LEFT HEART CATHETERIZATION WITH CORONARY ANGIOGRAM N/A 06/27/2013   Procedure: LEFT HEART CATHETERIZATION WITH CORONARY ANGIOGRAM;  Surgeon: Sinclair Grooms, MD;  Location: [facility] CATH LAB;  Service: Cardiovascular;  Laterality: N/A;  iabp insertion, cpr, intubation, emergent cvts  . TEE WITHOUT CARDIOVERSION N/A 06/27/2013   Procedure: TRANSESOPHAGEAL ECHOCARDIOGRAM (TEE);  Surgeon: Ivin Poot, MD;  Location: [place];  Service: Open Heart Surgery;  Laterality: N/A;  . TONSILLECTOMY    . TOTAL HIP ARTHROPLASTY Left 09/01/2014   Procedure: TOTAL HIP ARTHROPLASTY;  Surgeon: Kerin Salen, MD;  Location: [place];  Service: Orthopedics;  Laterality: Left;     Current Meds  Medication Sig  . aspirin 81 MG EC tablet Take 81 mg by mouth daily. Swallow whole.  [name] atorvastatin (LIPITOR) 10 MG tablet Take 10 mg by mouth daily.  [name] lisinopril (PRINIVIL,ZESTRIL) 5 MG tablet Take 5 mg by mouth daily.  . metoprolol tartrate (LOPRESSOR) 25 MG tablet Take 0.5 tablets (12.5 mg total) by mouth 2 (two) times daily.  . nitroGLYCERIN (NITROSTAT) 0.4 MG SL tablet Place 1 tablet (0.4 mg total) under the tongue every 5 (five) minutes as needed for chest pain.  [name] terazosin (HYTRIN) 5 MG capsule Take 5 mg by mouth at bedtime.     Allergies:   Patient has no known allergies.   Social History   Tobacco Use  . Smoking status: Never Smoker  . Smokeless tobacco: Never Used  Substance Use Topics  . Alcohol use: Yes    Comment:  Rare  . Drug use: No     Family Hx: The patient's family history includes Heart attack in his brother and sister; Heart disease in his brother and sister. There is no history of Colon cancer, Hypertension, or Stroke.  ROS:   Please see the history of present illness.     All other systems reviewed and are negative.  Prior CV studies:   The following studies were reviewed today:  Vascular AAA duplex complete 07/27/2017: Indications: Follow up exam for known AAA. In 03/2014, an aorta duplex perfomed              at [facility] showed a distal AAA, measuring 5.0 x  5.1 x 4.7 cm. In 05/2014, a CT performed at [place]              showed maximum deminsions of AAA, measuring 4.9 cm and no evidence              of endoleaks.  Labs/Other Tests and Data Reviewed:    EKG:  An ECG dated 08/27/2018 was personally reviewed today and demonstrated:  NSR with nonspecific T wave abnormalities  Recent Labs: No results found for requested labs within last 8760 hours.   Recent Lipid Panel Lab Results  Component Value Date/Time   CHOL 137 08/29/2013 07:52 AM   TRIG 80.0 08/29/2013 07:52 AM   HDL 44.30 08/29/2013 07:52 AM   CHOLHDL 3 08/29/2013 07:52 AM   LDLCALC 77 08/29/2013 07:52 AM    Wt Readings from Last 3 Encounters:  12/14/18 200 lb (90.7 kg)  08/27/18 227 lb 12.8 oz (103.3 kg)  07/11/17 224 lb 12.8 oz (102 kg)    Objective:    Vital Signs:  BP 122/77 Comment: bp for thurs  Pulse 67   Resp 16   Ht 5\' 10"  (1.778 m)   Wt 200 lb (90.7 kg)   BMI 28.70 kg/m    VITAL SIGNS:  reviewed GEN:  no acute distress RESPIRATORY:  normal respiratory effort, symmetric expansion NEURO:  alert and oriented x 3, no obvious focal deficit PSYCH:  normal affect  ASSESSMENT & PLAN:    1.  CAD s/p CABG times 10/2012: -No angina or dyspnea -Aggressive risk factor prevention with adequate blood pressure, glucose and lipid control -Continue ASA 81,  atorvastatin, lisinopril, metoprolol   2.  History of CVA: -No neurologic complaints  3.  Essential hypertension: -Stable, 122/77 -Continue current regimen  4.  Hyperlipidemia: -LDL goal 70 mg/DL -Continue atorvastatin  5.  AAA status post repair 2015: -Stable, no symptoms  -Needs vascular surgeon follow-up>>place referral   6. S/p ankle fracture with repair: -As above, had a mechanical fall while in [facility] in which he underwent ankle fracture repair -Now living in [place]>>> follows with [facility] -Plan for hip replacement 6 weeks after ankle healed  -Asking for PCP referral    COVID-19 Education: The signs and symptoms of COVID-19 were discussed with the patient and how to seek care for testing (follow up with PCP or arrange E-visit). The importance of social distancing was discussed today.  Time:   Today, I have spent 20 minutes with the patient with telehealth technology discussing the above problems.     Medication Adjustments/Labs and Tests Ordered: Current medicines are reviewed at length with the patient today.  Concerns regarding medicines are outlined above.   Tests Ordered: Orders Placed This Encounter  Procedures  . Ambulatory referral to [facility]    Medication Changes: No orders of the defined types were placed in this encounter.   Disposition:  Follow up Dr. Tamala Julian in 1 year or sooner if needed  Signed, Kathyrn Drown, NP  12/14/2018 11:02 AM    [place]

## 2018-12-12 ENCOUNTER — Telehealth: Payer: Self-pay | Admitting: Interventional Cardiology

## 2018-12-12 NOTE — Telephone Encounter (Signed)
New Message   Patient's wife calling stating she needs help with her husband at home, and I advised her to try speaking to a social worker at the rehab her husband just came from.  Patient's wife would like to speak to a nurse about any recommendations we have for assistance at home.

## 2018-12-12 NOTE — Telephone Encounter (Addendum)
Spoke with wife, DPR on file.  Pt recently released for nursing and rehab facility.  Wife states she needs assistance with getting him up daily and getting him to appts.  Wife reached out to ortho office and they recommended [facility].  Wife contacted them and they can come out twice a week.  They are to come soon for a consult. Wife asked if there was any other places that could help. Mentioned [facility] and gave wife the number.  Wife will contact them to see if they can help and they plan to keep appt with Kathyrn Drown, NP on Friday.

## 2018-12-13 ENCOUNTER — Telehealth: Payer: Self-pay | Admitting: *Deleted

## 2018-12-13 ENCOUNTER — Telehealth: Payer: Self-pay | Admitting: Cardiology

## 2018-12-13 NOTE — Telephone Encounter (Signed)
Spoke with patient and wife who confirmed all demographics. Patient would like to have a phone only visit, no video.  I sent an e-mail with the My Chart link, but, the wife said that she was not sure if they are going to sign up for My Chart right now. Will have vitals ready for visit.    Virtual Visit Pre-Appointment Phone Call  "(Name), I am calling you today to discuss your upcoming appointment. We are currently trying to limit exposure to the virus that causes COVID-19 by seeing patients at home rather than in the office."  1. "What is the BEST phone number to call the day of the visit?" - include this in appointment notes  2. Do you have or have access to (through a family member/friend) a smartphone with video capability that we can use for your visit?" a. If yes - list this number in appt notes as cell (if different from BEST phone #) and list the appointment type as a VIDEO visit in appointment notes b. If no - list the appointment type as a PHONE visit in appointment notes  Confirm consent - "In the setting of the current Covid19 crisis, you are scheduled for a (phone or video) visit with your provider on (date) at (time).  Just as we do with many in-office visits, in order for you to participate in this visit, we must obtain consent.  If you'd like, I can send this to your mychart (if signed up) or email for you to review.  Otherwise, I can obtain your verbal consent now.  All virtual visits are billed to your insurance company just like a normal visit would be.  By agreeing to a virtual visit, we'd like you to understand that the technology does not allow for your provider to perform an examination, and thus may limit your provider's ability to fully assess your condition. If your provider identifies any concerns that need to be evaluated in person, we will make arrangements to do so.  Finally, though the technology is pretty good, we cannot assure that it will always work on either your  or our end, and in the setting of a video visit, we may have to convert it to a phone-only visit.  In either situation, we cannot ensure that we have a secure connection.  Are you willing to proceed?"  Patient said "yes".  3. Advise patient to be prepared - "Two hours prior to your appointment, go ahead and check your blood pressure, pulse, oxygen saturation, and your weight (if you have the equipment to check those) and write them all down. When your visit starts, your provider will ask you for this information. If you have an Apple Watch or Kardia device, please plan to have heart rate information ready on the day of your appointment. Please have a pen and paper handy nearby the day of the visit as well."  4. Give patient instructions for MyChart download to smartphone OR Doximity/Doxy.me as below if video visit (depending on what platform provider is using)  5. Inform patient they will receive a phone call 15 minutes prior to their appointment time (may be from unknown caller ID) so they should be prepared to answer    TELEPHONE CALL NOTE  Tyler [name] has been deemed a candidate for a follow-up tele-health visit to limit community exposure during the Covid-19 pandemic. I spoke with the patient via phone to ensure availability of phone/video source, confirm preferred email & phone number, and discuss  instructions and expectations.  I reminded Tyler [name] to be prepared with any vital sign and/or heart rhythm information that could potentially be obtained via home monitoring, at the time of his visit. I reminded Tyler [name] to expect a phone call prior to his visit.  Tyler [name] 12/13/2018 3:55 PM   INSTRUCTIONS FOR DOWNLOADING THE MYCHART APP TO SMARTPHONE  - The patient must first make sure to have activated MyChart and know their login information - If Apple, go to [employer] and type in MyChart in the search bar and download the app. If Android, ask patient to go to [employer] and type in [place] in the search bar and download the app. The app is free but as with any other app downloads, their phone may require them to verify saved payment information or Apple/Android password.  - The patient will need to then log into the app with their MyChart username and password, and select [facility] as their healthcare provider to link the account. When it is time for your visit, go to the MyChart app, find appointments, and click Begin Video Visit. Be sure to Select Allow for your device to access the Microphone and Camera for your visit. You will then be connected, and your provider will be with you shortly.  **If they have any issues connecting, or need assistance please contact MyChart service desk [phone])**  **If using a computer, in order to ensure the best quality for their visit they will need to use either of the following Internet Browsers: [employer], or Google Chrome**  IF USING DOXIMITY or DOXY.ME - The patient will receive a link just prior to their visit by text.     FULL LENGTH CONSENT FOR TELE-HEALTH VISIT   I hereby voluntarily request, consent and authorize [facility] and its employed or contracted physicians, physician assistants, nurse practitioners or other licensed health care professionals (the Practitioner), to provide me with telemedicine health care services (the Services") as deemed necessary by the treating Practitioner. I acknowledge and consent to receive the Services by the Practitioner via telemedicine. I understand that the telemedicine visit will involve communicating with the Practitioner through live audiovisual communication technology and the disclosure of certain medical information by electronic transmission. I acknowledge that I have been given the opportunity to request an in-person assessment or other available alternative prior to the telemedicine visit and am voluntarily participating in the telemedicine  visit.  I understand that I have the right to withhold or withdraw my consent to the use of telemedicine in the course of my care at any time, without affecting my right to future care or treatment, and that the Practitioner or I may terminate the telemedicine visit at any time. I understand that I have the right to inspect all information obtained and/or recorded in the course of the telemedicine visit and may receive copies of available information for a reasonable fee.  I understand that some of the potential risks of receiving the Services via telemedicine include:   Delay or interruption in medical evaluation due to technological equipment failure or disruption;  Information transmitted may not be sufficient (e.g. poor resolution of images) to allow for appropriate medical decision making by the Practitioner; and/or   In rare instances, security protocols could fail, causing a breach of personal health information.  Furthermore, I acknowledge that it is my responsibility to provide information about my medical history, conditions and care that is complete and accurate to the  best of my ability. I acknowledge that Practitioner's advice, recommendations, and/or decision may be based on factors not within their control, such as incomplete or inaccurate data provided by me or distortions of diagnostic images or specimens that may result from electronic transmissions. I understand that the practice of medicine is not an exact science and that Practitioner makes no warranties or guarantees regarding treatment outcomes. I acknowledge that I will receive a copy of this consent concurrently upon execution via email to the email address I last provided but may also request a printed copy by calling the office of [facility].    I understand that my insurance will be billed for this visit.   I have read or had this consent read to me.  I understand the contents of this consent, which adequately explains  the benefits and risks of the Services being provided via telemedicine.   I have been provided ample opportunity to ask questions regarding this consent and the Services and have had my questions answered to my satisfaction.  I give my informed consent for the services to be provided through the use of telemedicine in my medical care  By participating in this telemedicine visit I agree to the above.

## 2018-12-13 NOTE — Telephone Encounter (Signed)
Called pt to get consent for virtual visit scheduled for 12/14/2018.  Left a message for pt to call back. Whoever gets call can just get consent and document it. Thanks!

## 2018-12-14 ENCOUNTER — Other Ambulatory Visit: Payer: Self-pay

## 2018-12-14 ENCOUNTER — Encounter: Payer: Self-pay | Admitting: Cardiology

## 2018-12-14 ENCOUNTER — Telehealth (INDEPENDENT_AMBULATORY_CARE_PROVIDER_SITE_OTHER): Payer: Medicare Other | Admitting: Cardiology

## 2018-12-14 VITALS — BP 122/77 | HR 67 | Resp 16 | Ht 70.0 in | Wt 200.0 lb

## 2018-12-14 DIAGNOSIS — Z951 Presence of aortocoronary bypass graft: Secondary | ICD-10-CM

## 2018-12-14 DIAGNOSIS — I714 Abdominal aortic aneurysm, without rupture, unspecified: Secondary | ICD-10-CM

## 2018-12-14 DIAGNOSIS — I2510 Atherosclerotic heart disease of native coronary artery without angina pectoris: Secondary | ICD-10-CM

## 2018-12-14 DIAGNOSIS — S82843A Displaced bimalleolar fracture of unspecified lower leg, initial encounter for closed fracture: Secondary | ICD-10-CM

## 2018-12-14 DIAGNOSIS — I10 Essential (primary) hypertension: Secondary | ICD-10-CM

## 2018-12-14 DIAGNOSIS — E782 Mixed hyperlipidemia: Secondary | ICD-10-CM

## 2018-12-14 NOTE — Patient Instructions (Addendum)
Medication Instructions:  Your physician recommends that you continue on your current medications as directed. Please refer to the Current Medication list given to you today.  If you need a refill on your cardiac medications before your next appointment, please call your pharmacy.   Lab work: NONE If you have labs (blood work) drawn today and your tests are completely normal, you will receive your results only by: [name] MyChart Message (if you have MyChart) OR . A paper copy in the mail If you have any lab test that is abnormal or we need to change your treatment, we will call you to review the results.  Testing/Procedures: NONE  Follow-Up: At [facility], you and your health needs are our priority.  As part of our continuing mission to provide you with exceptional heart care, we have created designated Provider Care Teams.  These Care Teams include your primary Cardiologist (physician) and Advanced Practice Providers (APPs -  Physician Assistants and Nurse Practitioners) who all work together to provide you with the care you need, when you need it. You will need a follow up appointment in 12 months.  Please call our office 2 months in advance to schedule this appointment.  You may see Sinclair Grooms, MD or one of the following Advanced Practice Providers on your designated Care Team:   Truitt Merle, NP Cecilie Kicks, NP . Kathyrn Drown, NP  YOU HAVE BE REFERRED TO [facility]. THEY WILL CALL YOU TO SCHEDULE AN APPOINTMENT. YOU HAVE ALSO BEEN REFEREED TO A VASCULAR SURGEON. THEY WILL CALL YOU TO SCHEDULE AN APPOINTMENT.

## 2019-02-02 ENCOUNTER — Other Ambulatory Visit: Payer: Self-pay

## 2019-02-02 ENCOUNTER — Encounter (HOSPITAL_COMMUNITY): Payer: Self-pay | Admitting: Emergency Medicine

## 2019-02-02 ENCOUNTER — Emergency Department (HOSPITAL_COMMUNITY)
Admission: EM | Admit: 2019-02-02 | Discharge: 2019-02-02 | Disposition: A | Discharge status: Home / Self Care | Payer: Medicare Other | Attending: Emergency Medicine | Admitting: Emergency Medicine

## 2019-02-02 DIAGNOSIS — I2510 Atherosclerotic heart disease of native coronary artery without angina pectoris: Secondary | ICD-10-CM | POA: Diagnosis not present

## 2019-02-02 DIAGNOSIS — Z79899 Other long term (current) drug therapy: Secondary | ICD-10-CM | POA: Insufficient documentation

## 2019-02-02 DIAGNOSIS — Z7982 Long term (current) use of aspirin: Secondary | ICD-10-CM | POA: Diagnosis not present

## 2019-02-02 DIAGNOSIS — I10 Essential (primary) hypertension: Secondary | ICD-10-CM | POA: Insufficient documentation

## 2019-02-02 DIAGNOSIS — N3001 Acute cystitis with hematuria: Secondary | ICD-10-CM | POA: Diagnosis not present

## 2019-02-02 DIAGNOSIS — R300 Dysuria: Secondary | ICD-10-CM

## 2019-02-02 DIAGNOSIS — R350 Frequency of micturition: Secondary | ICD-10-CM | POA: Diagnosis present

## 2019-02-02 LAB — CBC WITH DIFFERENTIAL/PLATELET
Abs Immature Granulocytes: 0.02 10*3/uL (ref 0.00–0.07)
Basophils Absolute: 0.1 10*3/uL (ref 0.0–0.1)
Basophils Relative: 1 %
Eosinophils Absolute: 0.1 10*3/uL (ref 0.0–0.5)
Eosinophils Relative: 2 %
HCT: 36.1 % — ABNORMAL LOW (ref 39.0–52.0)
Hemoglobin: 11.6 g/dL — ABNORMAL LOW (ref 13.0–17.0)
Immature Granulocytes: 0 %
Lymphocytes Relative: 24 %
Lymphs Abs: 1.8 10*3/uL (ref 0.7–4.0)
MCH: 31.2 pg (ref 26.0–34.0)
MCHC: 32.1 g/dL (ref 30.0–36.0)
MCV: 97 fL (ref 80.0–100.0)
Monocytes Absolute: 0.6 10*3/uL (ref 0.1–1.0)
Monocytes Relative: 8 %
Neutro Abs: 4.9 10*3/uL (ref 1.7–7.7)
Neutrophils Relative %: 65 %
Platelets: 141 10*3/uL — ABNORMAL LOW (ref 150–400)
RBC: 3.72 MIL/uL — ABNORMAL LOW (ref 4.22–5.81)
RDW: 15.8 % — ABNORMAL HIGH (ref 11.5–15.5)
WBC: 7.6 10*3/uL (ref 4.0–10.5)
nRBC: 0 % (ref 0.0–0.2)

## 2019-02-02 LAB — COMPREHENSIVE METABOLIC PANEL
ALT: 11 U/L (ref 0–44)
AST: 15 U/L (ref 15–41)
Albumin: 3.2 g/dL — ABNORMAL LOW (ref 3.5–5.0)
Alkaline Phosphatase: 47 U/L (ref 38–126)
Anion gap: 10 (ref 5–15)
BUN: 23 mg/dL (ref 8–23)
CO2: 24 mmol/L (ref 22–32)
Calcium: 8.8 mg/dL — ABNORMAL LOW (ref 8.9–10.3)
Chloride: 107 mmol/L (ref 98–111)
Creatinine, Ser: 1.3 mg/dL — ABNORMAL HIGH (ref 0.61–1.24)
GFR calc Af Amer: 57 mL/min — ABNORMAL LOW (ref >60)
GFR calc non Af Amer: 49 mL/min — ABNORMAL LOW (ref >60)
Glucose, Bld: 95 mg/dL (ref 70–99)
Potassium: 3.8 mmol/L (ref 3.5–5.1)
Sodium: 141 mmol/L (ref 135–145)
Total Bilirubin: 1 mg/dL (ref 0.3–1.2)
Total Protein: 6 g/dL — ABNORMAL LOW (ref 6.5–8.1)

## 2019-02-02 LAB — URINALYSIS, ROUTINE W REFLEX MICROSCOPIC
Bilirubin Urine: NEGATIVE
Glucose, UA: NEGATIVE mg/dL
Ketones, ur: NEGATIVE mg/dL
Nitrite: NEGATIVE
Protein, ur: NEGATIVE mg/dL
Specific Gravity, Urine: 1.011 (ref 1.005–1.030)
WBC, UA: >50 WBC/hpf — ABNORMAL HIGH (ref 0–5)
pH: 6 (ref 5.0–8.0)

## 2019-02-02 MED ORDER — SULFAMETHOXAZOLE-TRIMETHOPRIM 800-160 MG PO TABS: 1.0000 | ORAL_TABLET | Freq: Two times a day (BID) | ORAL | 0 refills | Status: AC

## 2019-02-02 MED ORDER — SULFAMETHOXAZOLE-TRIMETHOPRIM 800-160 MG PO TABS
1.0000 | ORAL_TABLET | Freq: Once | ORAL | Status: AC
  Administered 2019-02-02: 1 via ORAL
  Filled 2019-02-02: qty 1

## 2019-02-02 MED ORDER — SODIUM CHLORIDE 0.9 % IV SOLN: 1.0000 g | Freq: Once | INTRAVENOUS | Status: DC

## 2019-02-02 NOTE — ED Triage Notes (Signed)
Per PT[facility]- pt here from home for complaint of dysuria, pt is ambulatory.

## 2019-02-02 NOTE — Discharge Instructions (Addendum)
You have been seen today for a urinary tract infection. Please read and follow all provided instructions.   1. Medications: bactrim (antibiotic), usual home medications 2. Treatment: rest, drink plenty of fluids 3. Follow Up: Please follow up with your primary doctor in 2 days for discussion of your diagnoses and further evaluation after today's visit; if you do not have a primary care doctor use the resource guide provided to find one; Please return to the ER for any new or worsening symptoms. Please obtain all of your results from medical records or have your doctors office obtain the results - share them with your doctor - you should be seen at your doctors office. Call today to arrange your follow up.   Take medications as prescribed. Please review all of the medicines and only take them if you do not have an allergy to them. Return to the emergency room for worsening condition or new concerning symptoms. Follow up with your regular doctor. If you don't have a regular doctor use one of the numbers below to establish a primary care doctor.  Please be aware that if you are taking birth control pills, taking other prescriptions, ESPECIALLY ANTIBIOTICS may make the birth control ineffective - if this is the case, either do not engage in sexual activity or use alternative methods of birth control such as condoms until you have finished the medicine and your family doctor says it is OK to restart them. If you are on a blood thinner such as COUMADIN, be aware that any other medicine that you take may cause the coumadin to either work too much, or not enough - you should have your coumadin level rechecked in next 7 days if this is the case.  ?  It is also a possibility that you have an allergic reaction to any of the medicines that you have been prescribed - Everybody reacts differently to medications and while MOST people have no trouble with most medicines, you may have a reaction such as nausea, vomiting,  rash, swelling, shortness of breath. If this is the case, please stop taking the medicine immediately and contact your physician.  ?  You should return to the ER if you develop severe or worsening symptoms.   Emergency Department Resource Guide 1) Find a Doctor and Pay Out of Pocket Although you won't have to find out who is covered by your insurance plan, it is a good idea to ask around and get recommendations. You will then need to call the office and see if the doctor you have chosen will accept you as a new patient and what types of options they offer for patients who are self-pay. Some doctors offer discounts or will set up payment plans for their patients who do not have insurance, but you will need to ask so you aren't surprised when you get to your appointment.  2) Contact Your Local Health Department Not all health departments have doctors that can see patients for sick visits, but many do, so it is worth a call to see if yours does. If you don't know where your local health department is, you can check in your phone book. The CDC also has a tool to help you locate your state's health department, and many state websites also have listings of all of their local health departments.  3) Find a [facility] If your illness is not likely to be very severe or complicated, you may want to try a walk in clinic. These are  popping up all over the country in pharmacies, drugstores, and shopping centers. They're usually staffed by nurse practitioners or physician assistants that have been trained to treat common illnesses and complaints. They're usually fairly quick and inexpensive. However, if you have serious medical issues or chronic medical problems, these are probably not your best option.  No Primary Care Doctor: Call Health Connect at  [phone] - they can help you locate a primary care doctor that  accepts your insurance, provides certain services, etc. Physician Referral Service-  [phone]  Chronic Pain Problems: Organization         Address  Phone   Notes  [facility]  [phone] Patients need to be referred by their primary care doctor.   Medication Assistance: Organization         Address  Phone   Notes  [facility] Medication [facility] [address] [place], [place] [address] [phone] --Must be a resident of [facility] -- Must have NO insurance coverage whatsoever (no Medicaid/ Medicare, etc.) -- The pt. MUST have a primary care doctor that directs their care regularly and follows them in the community   [facility]  [phone]   [employer]  [phone]    Agencies that provide inexpensive medical care: Organization         Address  Phone   Notes  [place]  [phone]   [facility]    [phone]   [facility] [place], [place] [address] [phone]   [place] [address], [place] [phone]   [facility]anned [facility]    [phone]   [facility]    [phone]   [place] [facility] [address], [place] Phone:  [phone], Fax:  [phone] Hours of Operation:  9 am - 6 pm, M-F.  Also accepts Medicaid/Medicare and self-pay.  [facility] for [place] [address], [address], [place] Phone: [phone], Fax: [phone]. Hours of Operation:  8:30 am - 5:30 pm, M-F.  Also accepts Medicaid and self-pay.  [facility] [address] [place] Phone: [phone]   [place], [place], [place] [phone], Ext. [phone] Mondays & Thursdays: 7-9 AM.  First 15 patients are seen on a first come, first serve basis.    [place] Providers:  Organization         Address  Phone   Notes  [facility] [address],  [place] [phone] Also accepts self-pay patients.  [facility] [address], [place]  [phone]   [place], [address], [place] [phone]   [facility] [address] [place] [phone]   Lucianne Lei [address], [place]   [phone] Only accepts [place] patients after they have their name applied to their card.   Self-Pay (no insurance) in [facility]:  Organization         Address  Phone   Notes  Sickle Cell Patients, [facility] [place] [phone]   [facility] [place] [phone]   [facility]  [address] [place]  [address], [place] [phone]   [facility]/Dr. Osei-Bonsu  [address] [place] or [address], [place] [phone] Phone number for both [place] and [place] locations is the same.  [facility] [address], [place] [phone]   [facility] [address] [place] or [address] [phone]   [facility] [address] [place] [phone], phone; [phone], fax Sees patients 1st and 3rd Saturday of every month.  Must not qualify for public or private insurance (i.e. Medicaid, Medicare, [place] Health Choice, Veterans' Benefits)  Household income should be no more than 200% of the poverty level The clinic cannot treat you if you are pregnant or think you are pregnant  Sexually transmitted diseases are not treated at the clinic.

## 2019-02-02 NOTE — ED Notes (Signed)
Got patient undress on the monitor patient is resting with call bell in reach 

## 2019-02-02 NOTE — ED Provider Notes (Addendum)
[place] EMERGENCY DEPARTMENT Provider Note   CSN: [id number] Arrival date & time: 02/02/19  1037    History   Chief Complaint Chief Complaint  Patient presents with   Dysuria    HPI Tyler [name] is a 85 y.o. male with a PMH of AAA s/p stent grafting in 2015, CAD, CVA, and HTN presents due to hypotension and urinary frequency. [facility] reports patient had dysuria. Patient reports he has had urinary frequency for months. Patient denies dysuria, hematuria, abdominal pain, nausea, vomiting, or fever. Patient denies cough, congestion, sick exposures, or recent travel. Patient reports chronic bilateral low back pain for years and it is at baseline.  Denies numbness, tingling, weakness, incontinence to bowel/bladder, or IV drug use. Patient reports ice alleviates his back pain and nothing makes his back pain worse. Patient is able to ambulate without difficulty. Patient reports he broke his ankle in April. Patient denies taking any medications prior to arrival. Patient denies shortness of breath, chest pain, or syncope.      HPI  Past Medical History:  Diagnosis Date   AAA (abdominal aortic aneurysm) ([place])    a. 05/2014 s/p stent grafting [facility]).   Bronchitis    CAD (coronary artery disease)    a. 06/2013 VF Arrest/Cath: 3VD, EF 50%;  b. 06/2013 CABG x 4: LIMA->LAD, VG->PDA->RPL, VG->OM.   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/27/2013   Hyperlipidemia    Cerebral infarction [facility])     Past Surgical History:  Procedure Laterality Date   CORONARY ARTERY BYPASS GRAFT N/A 06/27/2013   Procedure: CORONARY ARTERY BYPASS GRAFTING (CABG);  Surgeon: Ivin Poot, MD;  Location: [place];  Service: Open Heart Surgery;  Laterality: N/A;  times four utilizing the left internal mammary artery and the right greater saphenous vein harvested endoscopically   ENDOVEIN HARVEST OF GREATER SAPHENOUS VEIN Right 06/27/2013   Procedure: ENDOVEIN HARVEST OF GREATER SAPHENOUS VEIN;  Surgeon: Ivin Poot, MD;  Location: [place];  Service: Open Heart Surgery;  Laterality: Right;   EYE SURGERY     bilateral cataracts   INNER EAR SURGERY Left 1982   INNER EAR SURGERY     severed a nerve in his left ear.    LEFT HEART CATHETERIZATION WITH CORONARY ANGIOGRAM N/A 06/27/2013   Procedure: LEFT HEART CATHETERIZATION WITH CORONARY ANGIOGRAM;  Surgeon: Sinclair Grooms, MD;  Location: [facility] CATH LAB;  Service: Cardiovascular;  Laterality: N/A;  iabp insertion, cpr, intubation, emergent cvts   TEE WITHOUT CARDIOVERSION N/A 06/27/2013   Procedure: TRANSESOPHAGEAL ECHOCARDIOGRAM (TEE);  Surgeon: Ivin Poot, MD;  Location: [place];  Service: Open Heart Surgery;  Laterality: N/A;   TONSILLECTOMY     TOTAL HIP ARTHROPLASTY Left 09/01/2014   Procedure: TOTAL HIP ARTHROPLASTY;  Surgeon: Kerin Salen, MD;  Location: [place];  Service: Orthopedics;  Laterality: Left;        Home Medications    Prior to Admission medications   Medication Sig Start Date End Date Taking? Authorizing Provider  aspirin 81 MG EC tablet Take 81 mg by mouth daily. Swallow whole.    [provider]  atorvastatin (LIPITOR) 10 MG tablet Take 10 mg by mouth daily.    [provider]  lisinopril (PRINIVIL,ZESTRIL) 5 MG tablet Take 5 mg by mouth daily.    [provider]   metoprolol tartrate (LOPRESSOR) 25 MG tablet Take 0.5 tablets (12.5 mg total) by mouth 2 (two) times daily. 08/29/13   Belva Crome, MD  nitroGLYCERIN (NITROSTAT) 0.4 MG SL tablet Place 1 tablet (0.4 mg total) under the tongue every 5 (five) minutes as needed for chest pain. 08/29/13   Belva Crome, MD  sulfamethoxazole-trimethoprim (BACTRIM DS) 800-160 MG tablet Take 1 tablet by mouth 2 (two) times daily for 7 days. 02/02/19 02/09/19  Darlin Drop P, PA-C  terazosin (HYTRIN) 5 MG capsule Take 5 mg by mouth at bedtime.    [provider]    Family History Family History  Problem Relation Age of Onset   Heart disease Sister    Heart disease Brother    Heart attack Sister    Heart attack Brother    Colon cancer Neg Hx    Hypertension Neg Hx    Stroke Neg Hx     Social History Social History   Tobacco Use   Smoking status: Never Smoker   Smokeless tobacco: Never Used  Substance Use Topics   Alcohol use: Yes    Comment: Rare   Drug use: No     Allergies   Patient has no known allergies.   Review of Systems Review of Systems  Constitutional: Negative for activity change, appetite change, chills and fever.  HENT: Negative for congestion, rhinorrhea and sore throat.   Respiratory: Negative for cough and shortness of breath.   Cardiovascular: Negative for chest pain, palpitations and leg swelling.  Gastrointestinal: Negative for abdominal pain, constipation, diarrhea, nausea and vomiting.  Genitourinary: Positive for frequency. Negative for dysuria, flank pain, genital sores, hematuria, penile pain, penile swelling, scrotal swelling and testicular pain.  Musculoskeletal: Positive for back pain. Negative for gait problem, myalgias and neck pain.  Skin: Negative for rash and wound.  Neurological: Negative for dizziness, weakness and numbness.     Physical Exam Updated Vital Signs BP 116/66    Pulse 86    Temp (S) 98.5 F (36.9 C) (Rectal)    Resp 15     SpO2 97%   Physical Exam Vitals signs and nursing note reviewed.  Constitutional:      General: He is not in acute distress.    Appearance: He is well-developed. He is not diaphoretic.  HENT:     Head: Normocephalic and atraumatic.     Mouth/Throat:     Mouth: Mucous membranes are moist.     Pharynx: No oropharyngeal exudate or posterior oropharyngeal erythema.  Eyes:     Extraocular Movements: Extraocular movements intact.     Conjunctiva/sclera: Conjunctivae normal.     Pupils: Pupils are equal, round, and reactive to light.  Neck:     Musculoskeletal: Normal range of motion.  Cardiovascular:     Rate and Rhythm: Normal rate and regular rhythm.     Heart sounds: Normal heart sounds. No murmur. No friction  rub. No gallop.   Pulmonary:     Effort: Pulmonary effort is normal. No respiratory distress.     Breath sounds: Normal breath sounds. No wheezing or rales.  Abdominal:     Palpations: Abdomen is soft.     Tenderness: There is no abdominal tenderness. There is no guarding.  Musculoskeletal: Normal range of motion.     Cervical back: He exhibits normal range of motion, no tenderness and no bony tenderness.     Thoracic back: He exhibits normal range of motion and no bony tenderness.     Lumbar back: He exhibits tenderness. He exhibits normal range of motion, no bony tenderness, no swelling, no edema and no deformity.     Comments: Bilateral lumbar paraspinal tenderness to palpation. No midline tenderness to palpation. Sensation and strength intact. Patient is able to ambulate without difficulty.   Skin:    General: Skin is warm.     Findings: No erythema or rash.  Neurological:     Mental Status: He is alert and oriented to person, place, and time.    Mental Status:  Alert, oriented, thought content appropriate, able to give a coherent history. Speech fluent without evidence of aphasia. Able to follow 2 step commands without difficulty.  Cranial Nerves:  II:  Peripheral  visual fields grossly normal, pupils equal, round, reactive to light III,IV, VI: ptosis not present, extra-ocular motions intact bilaterally  V,VII: smile symmetric, facial light touch sensation equal VIII: hearing grossly normal to voice  IX,X: symmetric elevation of soft palate, uvula elevates symmetrically  XI: bilateral shoulder shrug symmetric and strong XII: midline tongue extension without fassiculations Motor:  Normal tone. 5/5 in upper and lower extremities bilaterally including strong and equal grip strength and dorsiflexion/plantar flexion Sensory: light touch normal in all extremities.  Cerebellar: normal finger-to-nose with bilateral upper extremities Gait: normal gait and balance.  CV: distal pulses palpable throughout     ED Treatments / Results  Labs (all labs ordered are listed, but only abnormal results are displayed) Labs Reviewed  URINALYSIS, ROUTINE W REFLEX MICROSCOPIC - Abnormal; Notable for the following components:      Result Value   APPearance CLOUDY (*)    Hgb urine dipstick MODERATE (*)    Leukocytes,Ua LARGE (*)    WBC, UA >50 (*)    Bacteria, UA RARE (*)    All other components within normal limits  COMPREHENSIVE METABOLIC PANEL - Abnormal; Notable for the following components:   Creatinine, Ser 1.30 (*)    Calcium 8.8 (*)    Total Protein 6.0 (*)    Albumin 3.2 (*)    GFR calc non Af Amer 49 (*)    GFR calc Af Amer 57 (*)    All other components within normal limits  CBC WITH DIFFERENTIAL/PLATELET - Abnormal; Notable for the following components:   RBC 3.72 (*)    Hemoglobin 11.6 (*)    HCT 36.1 (*)    RDW 15.8 (*)    Platelets 141 (*)    All other components within normal limits  URINE CULTURE    EKG None  Radiology No results found.  Procedures Procedures (including critical care time)  Medications Ordered in ED Medications  sulfamethoxazole-trimethoprim (BACTRIM DS) 800-160 MG per tablet 1 tablet (1 tablet Oral Given 02/02/19  1305)     Initial Impression / Assessment and Plan / ED Course  I have reviewed the triage vital signs and the nursing notes.  Pertinent labs & imaging results that  were available during my care of the patient were reviewed by me and considered in my medical decision making (see chart for details).  Clinical Course as of Feb 02 1304  Sat Feb 02, 2019  1127 WBCs are within normal limits. Hemoglobin low at 11.6 but this appears to have improved from previous values.  WBC: 7.6 [AH]  1151 UA reveals large leukocytes, WBCs, moderate Hgb, and rare bacteria.   Leukocytes,Ua(!): LARGE [AH]    Clinical Course User Index [AH] Arville Lime, PA-C      Patient presents with hypotension and urinary frequency. Patient has not been hypotensive with EMS or while in the ER. Patient is afebrile and WBCs are within normal limits. No abdominal pain on exam. Patient has back pain, but appears to be chronic. UA reveals Hgb, large leukocytes, WBCs, and bacteria. Urine culture ordered. Provided antibiotics while in the ER. Prescribed antibiotics to take for UTI. Patient is able to eat and drink without difficulty. Patient is stable in no acute distress. Patient is requesting to be discharged. Discussed return precautions with patient. Patient states he understands and agrees with plan.   Findings and plan of care discussed with supervising physician Dr. Wilson Singer.   Final Clinical Impressions(s) / ED Diagnoses   Final diagnoses:  Dysuria  Acute cystitis with hematuria    ED Discharge Orders         Ordered    sulfamethoxazole-trimethoprim (BACTRIM DS) 800-160 MG tablet  2 times daily     02/02/19 [place], [place] P, [place] 02/02/19 1305    Arville Lime, PA-C 02/02/19 1305    Virgel Manifold, MD 02/03/19 [phone]

## 2019-02-02 NOTE — ED Notes (Signed)
Offered pt food but only requesting apple juice, apple juice given to pt.

## 2019-02-02 NOTE — ED Notes (Signed)
Pt no longer wants to wait for PTAR. Pt wife is in route, wife states if we can help him get into the car that she will be able to get him out of the car. Pt acknowledges understanding of DC instructions.

## 2019-02-04 LAB — URINE CULTURE: Culture: >=100000 — AB

## 2019-02-05 ENCOUNTER — Telehealth: Payer: Self-pay

## 2019-02-05 NOTE — Telephone Encounter (Signed)
Post ED Visit - Positive Culture Follow-up  Culture report reviewed by antimicrobial stewardship pharmacist: [place] Team []  Elenor Quinones, Pharm.D. []  Heide Guile, Pharm.D., BCPS AQ-ID [x]  Parks Neptune, Pharm.D., BCPS []  Alycia Rossetti, Pharm.D., BCPS []  [place], Pharm.D., BCPS, AAHIVP []  Legrand Como, Pharm.D., BCPS, AAHIVP []  Salome Arnt, PharmD, BCPS []  Johnnette Gourd, PharmD, BCPS []  Hughes Better, PharmD, BCPS []  Leeroy Cha, PharmD []  Laqueta Linden, PharmD, BCPS []  Albertina Parr, PharmD  [place] Team []  Leodis Sias, PharmD []  Lindell Spar, PharmD []  Royetta Asal, PharmD []  Graylin Shiver, Rph []  Rema Fendt) Glennon Mac, PharmD []  Arlyn Dunning, PharmD []  Netta Cedars, PharmD []  Dia Sitter, PharmD []  Leone Haven, PharmD []  Gretta Arab, PharmD []  Theodis Shove, PharmD []  Peggyann Juba, PharmD []  Reuel Boom, PharmD   Positive urine culture Treated with BactrimDS, organism sensitive to the same and no further patient follow-up is required at this time.  Genia Del 02/05/2019, 11:51 AM

## 2019-02-21 ENCOUNTER — Other Ambulatory Visit (HOSPITAL_COMMUNITY): Payer: Medicare Other

## 2019-02-21 ENCOUNTER — Encounter: Payer: Medicare Other | Admitting: Vascular Surgery

## 2019-03-04 ENCOUNTER — Other Ambulatory Visit: Payer: Self-pay

## 2019-03-04 DIAGNOSIS — I714 Abdominal aortic aneurysm, without rupture, unspecified: Secondary | ICD-10-CM

## 2019-03-07 ENCOUNTER — Ambulatory Visit (INDEPENDENT_AMBULATORY_CARE_PROVIDER_SITE_OTHER): Payer: Medicare Other | Admitting: Vascular Surgery

## 2019-03-07 ENCOUNTER — Encounter: Payer: Self-pay | Admitting: Vascular Surgery

## 2019-03-07 ENCOUNTER — Other Ambulatory Visit: Payer: Self-pay

## 2019-03-07 ENCOUNTER — Ambulatory Visit (HOSPITAL_COMMUNITY)
Admission: RE | Admit: 2019-03-07 | Discharge: 2019-03-07 | Disposition: A | Discharge status: Home / Self Care | Payer: Medicare Other | Source: Ambulatory Visit | Attending: Vascular Surgery | Admitting: Vascular Surgery

## 2019-03-07 VITALS — BP 103/59 | HR 77 | Temp 97.8°F | Resp 20 | Ht 70.0 in | Wt 200.0 lb

## 2019-03-07 DIAGNOSIS — I714 Abdominal aortic aneurysm, without rupture, unspecified: Secondary | ICD-10-CM

## 2019-03-07 DIAGNOSIS — I6300 Cerebral infarction due to thrombosis of unspecified precerebral artery: Secondary | ICD-10-CM

## 2019-03-07 NOTE — Progress Notes (Signed)
Referring Physician: Kathyrn Drown NP  Patient name: Tyler [name] MRN: [id number] DOB: 1928/12/22 Sex: male  REASON FOR CONSULT: Abdominal aortic aneurysm HPI: Tyler [name] is a 85 y.o. male, status post stent graft repair of abdominal aortic aneurysm at [facility] 2015.  He has no abdominal or back pain.  He did break his right ankle recently and has been minimally ambulatory since that time.  He is in a wheelchair most of the time.  He is able to walk some with a walker.  Other medical problems include coronary artery disease, reflux, hyperlipidemia all of which are currently stable.  Past Medical History:  Diagnosis Date  . AAA (abdominal aortic aneurysm) ([place])    a. 05/2014 s/p stent grafting [facility]).  . Bronchitis   . CAD (coronary artery disease)    a. 06/2013 VF Arrest/Cath: 3VD, EF 50%;  b. 06/2013 CABG x 4: LIMA->LAD, VG->PDA->RPL, VG->OM.  [name] CVA (cerebral infarction)   . Erectile dysfunction   . Essential hypertension   . GERD (gastroesophageal reflux disease)   . H/O hiatal hernia   . HOH (hard of hearing)    wears hearing aid in right ear  . Hyperlipidemia   . Osteoarthritis   . Pneumonia    YRS AGO  . Stroke [facility])    Past Surgical History:  Procedure Laterality Date  . CORONARY ARTERY BYPASS GRAFT N/A 06/27/2013   Procedure: CORONARY ARTERY BYPASS GRAFTING (CABG);  Surgeon: Ivin Poot, MD;  Location: [place];  Service: Open Heart Surgery;  Laterality: N/A;  times four utilizing the left internal mammary artery and the right greater saphenous vein harvested endoscopically  . ENDOVEIN HARVEST OF GREATER SAPHENOUS VEIN Right 06/27/2013   Procedure: ENDOVEIN HARVEST OF GREATER SAPHENOUS VEIN;  Surgeon: Ivin Poot, MD;  Location: [place];  Service: Open Heart Surgery;  Laterality: Right;  . EYE SURGERY     bilateral cataracts  . INNER EAR SURGERY Left 1982  . INNER EAR SURGERY     severed a nerve in his left ear.   [name] LEFT HEART CATHETERIZATION WITH  CORONARY ANGIOGRAM N/A 06/27/2013   Procedure: LEFT HEART CATHETERIZATION WITH CORONARY ANGIOGRAM;  Surgeon: Sinclair Grooms, MD;  Location: [facility] CATH LAB;  Service: Cardiovascular;  Laterality: N/A;  iabp insertion, cpr, intubation, emergent cvts  . TEE WITHOUT CARDIOVERSION N/A 06/27/2013   Procedure: TRANSESOPHAGEAL ECHOCARDIOGRAM (TEE);  Surgeon: Ivin Poot, MD;  Location: [place];  Service: Open Heart Surgery;  Laterality: N/A;  . TONSILLECTOMY    . TOTAL HIP ARTHROPLASTY Left 09/01/2014   Procedure: TOTAL HIP ARTHROPLASTY;  Surgeon: Kerin Salen, MD;  Location: [place];  Service: Orthopedics;  Laterality: Left;    Family History  Problem Relation Age of Onset  . Heart disease Sister   . Heart disease Brother   . Heart attack Sister   . Heart attack Brother   . Colon cancer Neg Hx   . Hypertension Neg Hx   . Stroke Neg Hx     SOCIAL HISTORY: Social History   Socioeconomic History  . Marital status: Married    Spouse name: Not on file  . Number of children: Not on file  . Years of education: Not on file  . Highest education level: Not on file  Occupational History  . Not on file  Social Needs  . Financial resource strain: Not on file  . Food insecurity    Worry: Not on file  Inability: Not on file  . Transportation needs    Medical: Not on file    Non-medical: Not on file  Tobacco Use  . Smoking status: Never Smoker  . Smokeless tobacco: Never Used  Substance and Sexual Activity  . Alcohol use: Yes    Comment: Rare  . Drug use: No  . Sexual activity: Not on file  Lifestyle  . Physical activity    Days per week: Not on file    Minutes per session: Not on file  . Stress: Not on file  Relationships  . Social Herbalist on phone: Not on file    Gets together: Not on file    Attends religious service: Not on file    Active member of club or organization: Not on file    Attends meetings of clubs or organizations: Not on file    Relationship status:  Not on file  . Intimate partner violence    Fear of current or ex partner: Not on file    Emotionally abused: Not on file    Physically abused: Not on file    Forced sexual activity: Not on file  Other Topics Concern  . Not on file  Social History Narrative  . Not on file    No Known Allergies  Current Outpatient Medications  Medication Sig Dispense Refill  . aspirin 81 MG EC tablet Take 81 mg by mouth daily. Swallow whole.    [name] atorvastatin (LIPITOR) 10 MG tablet Take 10 mg by mouth daily.    [name] lisinopril (PRINIVIL,ZESTRIL) 5 MG tablet Take 5 mg by mouth daily.    . metoprolol tartrate (LOPRESSOR) 25 MG tablet Take 0.5 tablets (12.5 mg total) by mouth 2 (two) times daily. 90 tablet 3  . nitroGLYCERIN (NITROSTAT) 0.4 MG SL tablet Place 1 tablet (0.4 mg total) under the tongue every 5 (five) minutes as needed for chest pain. 25 tablet 3  . terazosin (HYTRIN) 5 MG capsule Take 5 mg by mouth at bedtime.     No current facility-administered medications for this visit.     ROS:   General:  No weight loss, Fever, chills  HEENT: No recent headaches, no nasal bleeding, no visual changes, no sore throat  Neurologic: No dizziness, blackouts, seizures. No recent symptoms of stroke or mini- stroke. No recent episodes of slurred speech, or temporary blindness.  Cardiac: No recent episodes of chest pain/pressure, no shortness of breath at rest.  +shortness of breath with exertion.  Denies history of atrial fibrillation or irregular heartbeat  Vascular: No history of rest pain in feet.  No history of claudication.  No history of non-healing ulcer, No history of DVT   Pulmonary: No home oxygen, no productive cough, no hemoptysis,  No asthma or wheezing  Musculoskeletal:  [X]  Arthritis, [ ]  Low back pain,  [X]  Joint pain  Hematologic:No history of hypercoagulable state.  No history of easy bleeding.  No history of anemia  Gastrointestinal: No hematochezia or melena,  No gastroesophageal  reflux, no trouble swallowing  Urinary: [ ]  chronic Kidney disease, [ ]  on HD - [ ]  MWF or [ ]  TTHS, [ ]  Burning with urination, [ ]  Frequent urination, [ ]  Difficulty urinating;   Skin: No rashes  Psychological: No history of anxiety,  No history of depression   Physical Examination  Vitals:   03/07/19 1037  BP: (!) 103/59  Pulse: 77  Resp: 20  Temp: 97.8 F (36.6 C)  SpO2: 95%  Weight: 200 lb (90.7 kg)  Height: 5\' 10"  (1.778 m)    Body mass index is 28.7 kg/m.  General:  Alert and oriented, no acute distress HEENT: Normal Cardiac: Regular Rate and Rhythm  Abdomen: Soft, non-tender, non-distended, no mass, obese Skin: No rash Extremity Pulses:  2+ radial, brachial, femoral, absent dorsalis pedis, posterior tibial pulses bilaterally Musculoskeletal: No deformity bilateral pretibial edema  Neurologic: Upper and lower extremity motor 5/5 and symmetric  DATA:  Patient had a duplex ultrasound of his aorta which showed 5.4 cm diameter slightly increased from 5 cm diameter a week ago.  This is probably within limits of error measuring.  There was no evidence of endoleak.  ASSESSMENT: 5.4 cm abdominal aortic aneurysm status post stent graft repair in [facility] 5 years ago.  Overall appears stable no evidence of endoleak.  Patient is overall very deconditioned and is not really ambulatory since his ankle fracture several months ago.  Hopefully this will improve with time.   PLAN: Patient will follow-up in 1 year with an aorto iliac ultrasound for his aneurysm stent graft  He will be seen in our APP clinic at that visit.  Ruta Hinds, MD [facility] [place] Office: [phone] Pager: [phone]

## 2019-08-06 NOTE — Progress Notes (Signed)
Cardiology Office Note:    Date:  08/07/2019   ID:  Tyler [name], DOB [birth year], MRN [id number]  PCP:  System, Provider Not In  Cardiologist:  Sinclair Grooms, MD   Referring MD: No ref. provider found   Chief Complaint  Patient presents with  . Coronary Artery Disease  . Advice Only    Fall risk    History of Present Illness:    Tyler [name] is a 86 y.o. male with a hx of CAD status post ECABG 06/2013 for V. fib arrest and ostial left main stenosis with a LIMA to the LAD, SVG to the PDA/PL 8, SVG to the OM. His EF was 50%. He also has hypertension, hyperlipidemia, and prior stroke. He underwent stent grafting of his AAA and [facility] 05/2014. He most recently underwent left hip replacement in 08/2014.  Tyler [name] has difficult time communicating because of hearing impairment.  He has had several falls recently, the most severe in April 2020 led to severe fracture of his lower extremity.  His wife complains that he has a shuffling gait at times.  She also states that he has occasional tremor.  He denies chest pain, orthopnea, PND, angina, and nitroglycerin use.  He notes lower extremity edema.  Past Medical History:  Diagnosis Date  . AAA (abdominal aortic aneurysm) ([place])    a. 05/2014 s/p stent grafting [facility]).  . Bronchitis   . CAD (coronary artery disease)    a. 06/2013 VF Arrest/Cath: 3VD, EF 50%;  b. 06/2013 CABG x 4: LIMA->LAD, VG->PDA->RPL, VG->OM.  [name] CVA (cerebral infarction)   . Erectile dysfunction   . Essential hypertension   . GERD (gastroesophageal reflux disease)   . H/O hiatal hernia   . HOH (hard of hearing)    wears hearing aid in right ear  . Hyperlipidemia   . Osteoarthritis   . Pneumonia    YRS AGO  . Stroke [facility])     Past Surgical History:  Procedure Laterality Date  . CORONARY ARTERY BYPASS GRAFT N/A 06/27/2013   Procedure: CORONARY ARTERY BYPASS GRAFTING (CABG);  Surgeon: Ivin Poot, MD;  Location: [place];  Service: Open Heart  Surgery;  Laterality: N/A;  times four utilizing the left internal mammary artery and the right greater saphenous vein harvested endoscopically  . ENDOVEIN HARVEST OF GREATER SAPHENOUS VEIN Right 06/27/2013   Procedure: ENDOVEIN HARVEST OF GREATER SAPHENOUS VEIN;  Surgeon: Ivin Poot, MD;  Location: [place];  Service: Open Heart Surgery;  Laterality: Right;  . EYE SURGERY     bilateral cataracts  . INNER EAR SURGERY Left 1982  . INNER EAR SURGERY     severed a nerve in his left ear.   [name] LEFT HEART CATHETERIZATION WITH CORONARY ANGIOGRAM N/A 06/27/2013   Procedure: LEFT HEART CATHETERIZATION WITH CORONARY ANGIOGRAM;  Surgeon: Sinclair Grooms, MD;  [facility] C[facility] LAB;  Service: Cardiovascular;  Laterality: N/A;  iabp insertion, cpr, intubation, emergent cvts  . TEE WITHOUT CARDIOVERSION N/A 06/27/2013   Procedure: TRANSESOPHAGEAL ECHOCARDIOGRAM (TEE);  Surgeon: Ivin Poot, MD;  Location: [place];  Service: Open Heart Surgery;  Laterality: N/A;  . TONSILLECTOMY    . TOTAL HIP ARTHROPLASTY Left 09/01/2014   Procedure: TOTAL HIP ARTHROPLASTY;  Surgeon: Kerin Salen, MD;  Location: [place];  Service: Orthopedics;  Laterality: Left;    Current Medications: Current Meds  Medication Sig  . aspirin 81 MG EC tablet Take 81 mg by mouth daily. Swallow whole.  [name]  atorvastatin (LIPITOR) 10 MG tablet Take 10 mg by mouth daily.  . metoprolol tartrate (LOPRESSOR) 25 MG tablet Take 0.5 tablets (12.5 mg total) by mouth 2 (two) times daily.  . nitroGLYCERIN (NITROSTAT) 0.4 MG SL tablet Place 1 tablet (0.4 mg total) under the tongue every 5 (five) minutes as needed for chest pain.  [name] terazosin (HYTRIN) 5 MG capsule Take 5 mg by mouth at bedtime.  . [DISCONTINUED] lisinopril (PRINIVIL,ZESTRIL) 5 MG tablet Take 5 mg by mouth daily.  . [DISCONTINUED] nitroGLYCERIN (NITROSTAT) 0.4 MG SL tablet Place 1 tablet (0.4 mg total) under the tongue every 5 (five) minutes as needed for chest pain.     Allergies:    Patient has no known allergies.   Social History   Socioeconomic History  . Marital status: Married    Spouse name: Not on file  . Number of children: Not on file  . Years of education: Not on file  . Highest education level: Not on file  Occupational History  . Not on file  Tobacco Use  . Smoking status: Never Smoker  . Smokeless tobacco: Never Used  Substance and Sexual Activity  . Alcohol use: Yes    Comment: Rare  . Drug use: No  . Sexual activity: Not on file  Other Topics Concern  . Not on file  Social History Narrative  . Not on file   Social Determinants of Health   Financial Resource Strain:   . Difficulty of Paying Living Expenses: Not on file  Food Insecurity:   . Worried About Charity fundraiser in the Last Year: Not on file  . Ran Out of Food in the Last Year: Not on file  Transportation Needs:   . Lack of Transportation (Medical): Not on file  . Lack of Transportation (Non-Medical): Not on file  Physical Activity:   . Days of Exercise per Week: Not on file  . Minutes of Exercise per Session: Not on file  Stress:   . Feeling of Stress : Not on file  Social Connections:   . Frequency of Communication with Friends and Family: Not on file  . Frequency of Social Gatherings with Friends and Family: Not on file  . Attends Religious Services: Not on file  . Active Member of Clubs or Organizations: Not on file  . Attends Archivist Meetings: Not on file  . Marital Status: Not on file     Family History: The patient's family history includes Heart attack in his brother and sister; Heart disease in his brother and sister. There is no history of Colon cancer, Hypertension, or Stroke.  ROS:   Please see the history of present illness.    Lower extremity swelling.  Decreased memory.  Severe hearing impairment.  Has [facility] physician in [place].  [name], MD.  All other systems reviewed and are  negative.  EKGs/Labs/Other Studies Reviewed:    The following studies were reviewed today: No recent laboratory data or imaging Abdominal duplex August 2020 Summary: Abdominal Aorta: The largest aortic measurement is 5.4 cm. Patent endovascular aneurysm repair with no evidence of endoleak. The largest aortic diameter remains essentially unchanged compared to prior exam. Previous diameter measurement was 5.0 cm  obtained on 07/27/2017.   *See table(s) above for measurements and observations.  EKG:  EKG demonstrates vertical axis, sinus rhythm, first-degree AV block, poor R wave progression V1 through V4.  Recent Labs: 02/02/2019: ALT 11; BUN 23; Creatinine, Ser 1.30; Hemoglobin  11.6; Platelets 141; Potassium 3.8; Sodium 141  Recent Lipid Panel    Component Value Date/Time   CHOL 137 08/29/2013 0752   TRIG 80.0 08/29/2013 0752   HDL 44.30 08/29/2013 0752   CHOLHDL 3 08/29/2013 0752   VLDL 16.0 08/29/2013 0752   LDLCALC 77 08/29/2013 0752    Physical Exam:    VS:  BP (!) 104/58   Pulse 65   Ht 5\' 10"  (1.778 m)   Wt 177 lb 6.4 oz (80.5 kg)   SpO2 96%   BMI 25.45 kg/m     Wt Readings from Last 3 Encounters:  08/07/19 177 lb 6.4 oz (80.5 kg)  03/07/19 200 lb (90.7 kg)  12/14/18 200 lb (90.7 kg)     GEN: Obese.  Pink face.. No acute distress HEENT: Normal NECK: No JVD. LYMPHATICS: No lymphadenopathy CARDIAC:  RRR without murmur, gallop, or edema. VASCULAR:  Normal Pulses. No bruits. RESPIRATORY:  Clear to auscultation without rales, wheezing or rhonchi  ABDOMEN: Soft, non-tender, non-distended, No pulsatile mass, MUSCULOSKELETAL: No deformity  SKIN: Warm and dry NEUROLOGIC:  Alert and oriented x 3 PSYCHIATRIC:  Normal affect   ASSESSMENT:    1. S/P CABG x 4   2. Abdominal aortic aneurysm (AAA) without rupture ([place])   3. Essential hypertension   4. Mixed hyperlipidemia   5. Bilateral lower extremity edema   6. Educated about COVID-19 virus infection   7. Coronary  atherosclerosis of native coronary artery    PLAN:    In order of problems listed above:  1. Stable without angina.  Secondary prevention briefly discussed. 2. We did not discuss abdominal aortic aneurysm.  This is followed by Dr. Oneida Alar and was last evaluated in August.  Diameter 5.3 cm. 3. 105/60 mmHg is too low at his age.  Discontinue lisinopril.  If blood pressure remains chronically less than 0000000 mmHg systolic, I would recommend also discontinuing the low-dose beta-blocker, especially in light of first-degree AV block on EKG.  I will leave this to Dr. Sherral Hammers at the [facility] in [place]. 4. Lipids are being followed at the [facility] 5. Bilateral lower extremity edema felt to be related to venous insufficiency.  Recommended bilateral knee-high support stockings.  He also wants to manage this through the [place]. 6. COVID-19 vaccine discussed.  3W's discussed.   Medication Adjustments/Labs and Tests Ordered: Current medicines are reviewed at length with the patient today.  Concerns regarding medicines are outlined above.  Orders Placed This Encounter  Procedures  . EKG 12-Lead   Meds ordered this encounter  Medications  . nitroGLYCERIN (NITROSTAT) 0.4 MG SL tablet    Sig: Place 1 tablet (0.4 mg total) under the tongue every 5 (five) minutes as needed for chest pain.    Dispense:  25 tablet    Refill:  3    Patient Instructions  Medication Instructions:  1) DISCONTINUE Lisinopril  *If you need a refill on your cardiac medications before your next appointment, please call your pharmacy*  Lab Work: None If you have labs (blood work) drawn today and your tests are completely normal, you will receive your results only by: [name] MyChart Message (if you have MyChart) OR . A paper copy in the mail If you have any lab test that is abnormal or we need to change your treatment, we will call you to review the results.  Testing/Procedures: None  Follow-Up: At [facility], you and your health needs are our priority.  As part  of our continuing mission to provide you with exceptional heart care, we have created designated Provider Care Teams.  These Care Teams include your primary Cardiologist (physician) and Advanced Practice Providers (APPs -  Physician Assistants and Nurse Practitioners) who all work together to provide you with the care you need, when you need it.  Your next appointment:   12 month(s)  The format for your next appointment:   In Person  Provider:   You may see Sinclair Grooms, MD or one of the following Advanced Practice Providers on your designated Care Team:    Truitt Merle, NP  Cecilie Kicks, NP  Kathyrn Drown, NP   Other Instructions      Signed, Sinclair Grooms, MD  08/07/2019 10:07 AM    [place]

## 2019-08-07 ENCOUNTER — Encounter: Payer: Self-pay | Admitting: Interventional Cardiology

## 2019-08-07 ENCOUNTER — Ambulatory Visit (INDEPENDENT_AMBULATORY_CARE_PROVIDER_SITE_OTHER): Payer: Medicare Other | Admitting: Interventional Cardiology

## 2019-08-07 ENCOUNTER — Other Ambulatory Visit: Payer: Self-pay

## 2019-08-07 VITALS — BP 104/58 | HR 65 | Ht 70.0 in | Wt 177.4 lb

## 2019-08-07 DIAGNOSIS — I25118 Atherosclerotic heart disease of native coronary artery with other forms of angina pectoris: Secondary | ICD-10-CM | POA: Diagnosis not present

## 2019-08-07 DIAGNOSIS — Z7189 Other specified counseling: Secondary | ICD-10-CM

## 2019-08-07 DIAGNOSIS — I714 Abdominal aortic aneurysm, without rupture, unspecified: Secondary | ICD-10-CM

## 2019-08-07 DIAGNOSIS — R600 Localized edema: Secondary | ICD-10-CM

## 2019-08-07 DIAGNOSIS — Z951 Presence of aortocoronary bypass graft: Secondary | ICD-10-CM

## 2019-08-07 DIAGNOSIS — E782 Mixed hyperlipidemia: Secondary | ICD-10-CM

## 2019-08-07 DIAGNOSIS — I10 Essential (primary) hypertension: Secondary | ICD-10-CM

## 2019-08-07 MED ORDER — NITROGLYCERIN 0.4 MG SL SUBL: 0.4000 mg | SUBLINGUAL_TABLET | SUBLINGUAL | 3 refills | Status: DC | PRN

## 2019-08-07 NOTE — Patient Instructions (Signed)
Medication Instructions:  1) DISCONTINUE Lisinopril  *If you need a refill on your cardiac medications before your next appointment, please call your pharmacy*  Lab Work: None If you have labs (blood work) drawn today and your tests are completely normal, you will receive your results only by: [name] MyChart Message (if you have MyChart) OR . A paper copy in the mail If you have any lab test that is abnormal or we need to change your treatment, we will call you to review the results.  Testing/Procedures: None  Follow-Up: At [facility], you and your health needs are our priority.  As part of our continuing mission to provide you with exceptional heart care, we have created designated Provider Care Teams.  These Care Teams include your primary Cardiologist (physician) and Advanced Practice Providers (APPs -  Physician Assistants and Nurse Practitioners) who all work together to provide you with the care you need, when you need it.  Your next appointment:   12 month(s)  The format for your next appointment:   In Person  Provider:   You may see Sinclair Grooms, MD or one of the following Advanced Practice Providers on your designated Care Team:    Truitt Merle, NP  Cecilie Kicks, NP  Kathyrn Drown, NP   Other Instructions

## 2020-06-01 ENCOUNTER — Telehealth: Payer: Self-pay | Admitting: Interventional Cardiology

## 2020-06-01 NOTE — Telephone Encounter (Signed)
Spoke with wife, DPR on file.  States pt's legs are swollen but she hadn't really noticed.  A friend of there's was over today and noticed it and told her she should call because her husband had the same problem.  Denies lightheadedness, dizziness, or CP.  Does have DOE that resolves with rest.  Asked wife if swelling improved with elevation and she said she doesn't really think so.  Denies increased salt intake.  Doesn't weigh and hasn't checked vitals.  Scheduled pt to see Truitt Merle, NP on Wednesday.  Wife appreciative for call.

## 2020-06-01 NOTE — Telephone Encounter (Signed)
° ° °  Pt c/o swelling: STAT is pt has developed SOB within 24 hours  1) How much weight have you gained and in what time span?   2) If swelling, where is the swelling located? legs  3) Are you currently taking a fluid pill? No  4) Are you currently SOB? Yes  5) Do you have a log of your daily weights (if so, list)?   6) Have you gained 3 pounds in a day or 5 pounds in a week?   7) Have you traveled recently? No   Pt's wife calling, she said she never notice until today that pt is having some swelling. She said it look pretty bad, they weren't sure if pt gained weight but she said she feels like pt needs to be seen sooner by Dr. Tamala Julian.

## 2020-06-02 NOTE — Progress Notes (Signed)
CARDIOLOGY OFFICE NOTE  Date:  06/03/2020    Tyler [name] Date of Birth: [birth year] Medical Record #[id number]  PCP:  [name], MD  Cardiologist:  Tamala Julian  Chief Complaint  Patient presents with  . Follow-up    History of Present Illness: Tyler [name] is a 86 y.o. male who presents today for a work in visit. Seen for Dr. Tamala Julian.   He has a history of CAD status post CABG 06/2013 in the setting of a VF arrest and ostial left main stenosis with a LIMA to the LAD, SVG to the PDA/PL and SVG to the OM. His EF was 50%. Other issues include HTN, HLD and prior stroke.  He underwent stent grafting of his AAA in [facility] 05/2014.   Was last seen in January by Dr. Tamala Julian - noted trouble communicating due to hearing impairment. Had had several falls - with injury. He has had chronic edema of the lower extremities noted. BP was low and his medicines were cut back.   Phone call earlier this week - "Spoke with wife, DPR on file.  States pt's legs are swollen but she hadn't really noticed.  A friend of there's was over today and noticed it and told her she should call because her husband had the same problem.  Denies lightheadedness, dizziness, or CP.  Does have DOE that resolves with rest.  Asked wife if swelling improved with elevation and she said she doesn't really think so.  Denies increased salt intake.  Doesn't weigh and hasn't checked vitals.  Scheduled pt to see Truitt Merle, NP on Wednesday.  Wife appreciative for call"  Thus added to my schedule for today.   Comes in today. Here with his wife - she helps augment the history. His last 3 weights in the system are very questionable and not reliable. Really only up by 3 pounds from last time he saw Dr. Tamala Julian in 2020. Stopped his aspirin on his own. He has swelling in both legs.  She notes more swelling that is pitting now. Seems worse recently. No real increase in salt use. He sits all day with his legs down. Not able to really  elevate. She has bought a wedge to put in the bed. She notes his breathing seems congested at times and he pants with moving around. He has gotten very sedentary. No chest pain. He denies being short of breath.   Past Medical History:  Diagnosis Date  . AAA (abdominal aortic aneurysm) ([place])    a. 05/2014 s/p stent grafting [facility]).  . Bronchitis   . CAD (coronary artery disease)    a. 06/2013 VF Arrest/Cath: 3VD, EF 50%;  b. 06/2013 CABG x 4: LIMA->LAD, VG->PDA->RPL, VG->OM.  [name] CVA (cerebral infarction)   . Erectile dysfunction   . Essential hypertension   . GERD (gastroesophageal reflux disease)   . H/O hiatal hernia   . HOH (hard of hearing)    wears hearing aid in right ear  . Hyperlipidemia   . Osteoarthritis   . Pneumonia    YRS AGO  . Stroke [facility])     Past Surgical History:  Procedure Laterality Date  . CORONARY ARTERY BYPASS GRAFT N/A 06/27/2013   Procedure: CORONARY ARTERY BYPASS GRAFTING (CABG);  Surgeon: Ivin Poot, MD;  Location: [place];  Service: Open Heart Surgery;  Laterality: N/A;  times four utilizing the left internal mammary artery and the right greater saphenous vein harvested endoscopically  . ENDOVEIN HARVEST OF GREATER  SAPHENOUS VEIN Right 06/27/2013   Procedure: ENDOVEIN HARVEST OF GREATER SAPHENOUS VEIN;  Surgeon: Ivin Poot, MD;  Location: [place];  Service: Open Heart Surgery;  Laterality: Right;  . EYE SURGERY     bilateral cataracts  . INNER EAR SURGERY Left 1982  . INNER EAR SURGERY     severed a nerve in his left ear.   [name] LEFT HEART CATHETERIZATION WITH CORONARY ANGIOGRAM N/A 06/27/2013   Procedure: LEFT HEART CATHETERIZATION WITH CORONARY ANGIOGRAM;  Surgeon: Sinclair Grooms, MD;  Location: [facility] CATH LAB;  Service: Cardiovascular;  Laterality: N/A;  iabp insertion, cpr, intubation, emergent cvts  . TEE WITHOUT CARDIOVERSION N/A 06/27/2013   Procedure: TRANSESOPHAGEAL ECHOCARDIOGRAM (TEE);  Surgeon: Ivin Poot, MD;  Location: [place];   Service: Open Heart Surgery;  Laterality: N/A;  . TONSILLECTOMY    . TOTAL HIP ARTHROPLASTY Left 09/01/2014   Procedure: TOTAL HIP ARTHROPLASTY;  Surgeon: Kerin Salen, MD;  Location: [place];  Service: Orthopedics;  Laterality: Left;     Medications: Current Meds  Medication Sig  . atorvastatin (LIPITOR) 10 MG tablet Take 10 mg by mouth daily.  . metoprolol tartrate (LOPRESSOR) 25 MG tablet Take 0.5 tablets (12.5 mg total) by mouth 2 (two) times daily.  . nitroGLYCERIN (NITROSTAT) 0.4 MG SL tablet Place 1 tablet (0.4 mg total) under the tongue every 5 (five) minutes as needed for chest pain.  [name] terazosin (HYTRIN) 5 MG capsule Take 5 mg by mouth at bedtime.  . [DISCONTINUED] aspirin 81 MG EC tablet Take 81 mg by mouth daily. Swallow whole.  . [DISCONTINUED] carbamide peroxide (DEBROX) 6.5 % OTIC solution INSTILL 5-10 DROPS IN BOTH EARS TWICE A DAY  . [DISCONTINUED] lidocaine (LIDODERM) 5 % APPLY 1 PATCH TO SKIN ONCE DAILY (APPLY FOR 12 HOURS, THEN REMOVE FOR 12 HOURS)     Allergies: No Known Allergies  Social History: The patient  reports that he has never smoked. He has never used smokeless tobacco. He reports current alcohol use. He reports that he does not use drugs.   Family History: The patient's family history includes Heart attack in his brother and sister; Heart disease in his brother and sister.   Review of Systems: Please see the history of present illness.   All other systems are reviewed and negative.   Physical Exam: VS:  BP 130/62 (BP Location: Left Arm, Patient Position: Sitting, Cuff Size: Normal)   Pulse 76   Ht 5\' 10"  (1.778 m)   Wt 230 lb 12.8 oz (104.7 kg)   SpO2 96% Comment: at rest  BMI 33.12 kg/m  .  BMI Body mass index is 33.12 kg/m.  Wt Readings from Last 3 Encounters:  06/03/20 230 lb 12.8 oz (104.7 kg)  08/07/19 177 lb 6.4 oz (80.5 kg)  03/07/19 200 lb (90.7 kg)    General: Elderly. Alert and in no acute distress. He weighed 227 in February of  2020. Jokes a lot. Obese.    HEENT: Normal. Hard of hearing.  Neck: Supple, no JVD, carotid bruits, or masses noted.  Cardiac: Regular rate and rhythm. Heart tones are distant. He has 1+ pitting edema - legs are quite dry and scaly. Small dime size ulcer on the outer ankle on the right.   Respiratory:  Lungs initially coarse but clears with coughing - not congested.   GI: Soft and nontender.  MS: No deformity or atrophy. Gait and ROM intact.  Skin: Warm and dry. Color is normal.  Neuro:  Strength and sensation are intact and no gross focal deficits noted.  Psych: Alert, appropriate and with normal affect.   LABORATORY DATA:  EKG:  EKG is ordered today.  Personally reviewed by me. This demonstrates sinus - diffuse T wave changes, septal Q's - unchanged - HR is 76.  Lab Results  Component Value Date   WBC 7.6 02/02/2019   HGB 11.6 (L) 02/02/2019   HCT 36.1 (L) 02/02/2019   PLT 141 (L) 02/02/2019   GLUCOSE 95 02/02/2019   CHOL 137 08/29/2013   TRIG 80.0 08/29/2013   HDL 44.30 08/29/2013   LDLCALC 77 08/29/2013   ALT 11 02/02/2019   AST 15 02/02/2019   NA 141 02/02/2019   K 3.8 02/02/2019   CL 107 02/02/2019   CREATININE 1.30 (H) 02/02/2019   BUN 23 02/02/2019   CO2 24 02/02/2019   INR 1.05 08/22/2014   HGBA1C 6.0 (H) 07/01/2013     BNP (last 3 results) No results for input(s): BNP in the last 8760 hours.  ProBNP (last 3 results) No results for input(s): PROBNP in the last 8760 hours.   Other Studies Reviewed Today:    ASSESSMENT & PLAN:    1. Lower extremity edema - seems like this is a chronic problem - may be worse - very sedentary and sitting all day - most likely with some degree of venous insufficiency - will try low dose Lasix for a week and then prn. Lab today. Would hold on echo for now - favor conservative management.   2. Known CAD with prior CABG x 4 in 2014 - no chest pain noted - favor conservative management. Would resume aspirin.   3. AAA - prior  stent graft repair in 2015 - followed by Dr. Oneida Alar - not discussed.   4. HTN - BP is fine on his current regimen. Remains off ACE from last visit. BP is fine.   5. HLD - remains on statin - lab today.    Current medicines are reviewed with the patient today.  The patient does not have concerns regarding medicines other than what has been noted above.  The following changes have been made:  See above.  Labs/ tests ordered today include:    Orders Placed This Encounter  Procedures  . Basic metabolic panel  . CBC  . Hepatic function panel  . Lipid panel  . TSH  . EKG 12-Lead     Disposition:   FU with Dr. Tamala Julian for recheck in about a month.     Patient is agreeable to this plan and will call if any problems develop in the interim.   SignedTruitt Merle, NP  06/03/2020 3:58 PM  [place] [address] [place], [place]  [address] Phone: [phone] Fax: [phone]

## 2020-06-03 ENCOUNTER — Encounter: Payer: Self-pay | Admitting: Nurse Practitioner

## 2020-06-03 ENCOUNTER — Other Ambulatory Visit: Payer: Self-pay

## 2020-06-03 ENCOUNTER — Ambulatory Visit (INDEPENDENT_AMBULATORY_CARE_PROVIDER_SITE_OTHER): Payer: Medicare Other | Admitting: Nurse Practitioner

## 2020-06-03 VITALS — BP 130/62 | HR 76 | Ht 70.0 in | Wt 230.8 lb

## 2020-06-03 DIAGNOSIS — Z951 Presence of aortocoronary bypass graft: Secondary | ICD-10-CM

## 2020-06-03 DIAGNOSIS — E782 Mixed hyperlipidemia: Secondary | ICD-10-CM | POA: Diagnosis not present

## 2020-06-03 DIAGNOSIS — I10 Essential (primary) hypertension: Secondary | ICD-10-CM | POA: Diagnosis not present

## 2020-06-03 DIAGNOSIS — I25118 Atherosclerotic heart disease of native coronary artery with other forms of angina pectoris: Secondary | ICD-10-CM

## 2020-06-03 DIAGNOSIS — R600 Localized edema: Secondary | ICD-10-CM | POA: Diagnosis not present

## 2020-06-03 MED ORDER — ASPIRIN EC 81 MG PO TBEC: 81.0000 mg | DELAYED_RELEASE_TABLET | Freq: Every day | ORAL | 3 refills | Status: DC

## 2020-06-03 MED ORDER — FUROSEMIDE 20 MG PO TABS: 20.0000 mg | ORAL_TABLET | ORAL | 3 refills | Status: DC

## 2020-06-03 NOTE — Patient Instructions (Addendum)
After Visit Summary:  We will be checking the following labs today - BMET, CBC, HPF, Lipids and TSH   Medication Instructions:    Continue with your current medicines.   Ok to resume baby aspirin one a day  I am sending in Lasix 20 mg to take every day for one week and then only as needed for swelling. This is at your pharmacy.    If you need a refill on your cardiac medications before your next appointment, please call your pharmacy.     Testing/Procedures To Be Arranged:  N/A  Follow-Up:   See Dr. Tamala Julian in about a month for recheck and then as planned in February.     At [facility], you and your health needs are our priority.  As part of our continuing mission to provide you with exceptional heart care, we have created designated Provider Care Teams.  These Care Teams include your primary Cardiologist (physician) and Advanced Practice Providers (APPs -  Physician Assistants and Nurse Practitioners) who all work together to provide you with the care you need, when you need it.  Special Instructions:  . Stay safe, wash your hands for at least 20 seconds and wear a mask when needed.  . It was good to talk with you today.    Call the [place] office at [phone] if you have any questions, problems or concerns.

## 2020-06-04 LAB — LIPID PANEL
Chol/HDL Ratio: 4.6 ratio (ref 0.0–5.0)
Cholesterol, Total: 160 mg/dL (ref 100–199)
HDL: 35 mg/dL — ABNORMAL LOW (ref >39)
LDL Chol Calc (NIH): 100 mg/dL — ABNORMAL HIGH (ref 0–99)
Triglycerides: 143 mg/dL (ref 0–149)
VLDL Cholesterol Cal: 25 mg/dL (ref 5–40)

## 2020-06-04 LAB — CBC
Hematocrit: 40.1 % (ref 37.5–51.0)
Hemoglobin: 13.2 g/dL (ref 13.0–17.7)
MCH: 30.3 pg (ref 26.6–33.0)
MCHC: 32.9 g/dL (ref 31.5–35.7)
MCV: 92 fL (ref 79–97)
Platelets: 177 10*3/uL (ref 150–450)
RBC: 4.35 x10E6/uL (ref 4.14–5.80)
RDW: 14.3 % (ref 11.6–15.4)
WBC: 8.1 10*3/uL (ref 3.4–10.8)

## 2020-06-04 LAB — BASIC METABOLIC PANEL
BUN/Creatinine Ratio: 13 (ref 10–24)
BUN: 20 mg/dL (ref 8–27)
CO2: 22 mmol/L (ref 20–29)
Calcium: 8.8 mg/dL (ref 8.6–10.2)
Chloride: 102 mmol/L (ref 96–106)
Creatinine, Ser: 1.52 mg/dL — ABNORMAL HIGH (ref 0.76–1.27)
GFR calc Af Amer: 46 mL/min/{1.73_m2} — ABNORMAL LOW (ref >59)
GFR calc non Af Amer: 40 mL/min/{1.73_m2} — ABNORMAL LOW (ref >59)
Glucose: 116 mg/dL — ABNORMAL HIGH (ref 65–99)
Potassium: 4.1 mmol/L (ref 3.5–5.2)
Sodium: 139 mmol/L (ref 134–144)

## 2020-06-04 LAB — HEPATIC FUNCTION PANEL
ALT: 12 IU/L (ref 0–44)
AST: 17 IU/L (ref 0–40)
Albumin: 4.1 g/dL (ref 3.6–4.6)
Alkaline Phosphatase: 59 IU/L (ref 44–121)
Bilirubin Total: 0.5 mg/dL (ref 0.0–1.2)
Bilirubin, Direct: 0.18 mg/dL (ref 0.00–0.40)
Total Protein: 6.5 g/dL (ref 6.0–8.5)

## 2020-06-04 LAB — TSH: TSH: 3.46 u[IU]/mL (ref 0.450–4.500)

## 2020-08-13 NOTE — Progress Notes (Signed)
Cardiology Office Note:    Date:  08/17/2020   ID:  Tyler [name], DOB 1928-11-06, MRN [id number]  PCP:  [name], MD  Cardiologist:  Sinclair Grooms, MD   Referring MD: [name], MD   Chief Complaint  Patient presents with  . Coronary Artery Disease  . Hyperlipidemia  . Hypertension    History of Present Illness:    Tyler [name] is a 87 y.o. male with a hx of CAD status post ECABG 06/2013 for V. fib arrest and ostial left main stenosis with a LIMA to the LAD, SVG to the PDA/PL 8, SVG to the OM. His EF was 50%. He also has hypertension, hyperlipidemia, and prior stroke. He underwent stent grafting of his AAA and [facility] 05/2014. He most recently underwent left hip replacement in 08/2014.  Mr. [name] is back for follow-up of bilateral lower extremity edema, right greater than left leg.  He was given 5 days of furosemide 20 mg and then as needed dosing by Kathrynn Humble in November.  The furosemide did not seem to make any difference.  He is accompanied by his wife today.  He has a difficult time ambulating due to musculoskeletal issues.  He denies orthopnea, shortness of breath, palpitations, and chest pain.  He has developed some scaling of the lower extremity skin bilaterally right leg greater than left.  Past Medical History:  Diagnosis Date  . AAA (abdominal aortic aneurysm) ([place])    a. 05/2014 s/p stent grafting [facility]).  . Bronchitis   . CAD (coronary artery disease)    a. 06/2013 VF Arrest/Cath: 3VD, EF 50%;  b. 06/2013 CABG x 4: LIMA->LAD, VG->PDA->RPL, VG->OM.  [name] CVA (cerebral infarction)   . Erectile dysfunction   . Essential hypertension   . GERD (gastroesophageal reflux disease)   . H/O hiatal hernia   . HOH (hard of hearing)    wears hearing aid in right ear  . Hyperlipidemia   . Osteoarthritis   . Pneumonia    YRS AGO  . Stroke [facility])     Past Surgical History:  Procedure Laterality Date  . CORONARY ARTERY BYPASS GRAFT N/A 06/27/2013    Procedure: CORONARY ARTERY BYPASS GRAFTING (CABG);  Surgeon: Ivin Poot, MD;  Location: [place];  Service: Open Heart Surgery;  Laterality: N/A;  times four utilizing the left internal mammary artery and the right greater saphenous vein harvested endoscopically  . ENDOVEIN HARVEST OF GREATER SAPHENOUS VEIN Right 06/27/2013   Procedure: ENDOVEIN HARVEST OF GREATER SAPHENOUS VEIN;  Surgeon: Ivin Poot, MD;  Location: [place];  Service: Open Heart Surgery;  Laterality: Right;  . EYE SURGERY     bilateral cataracts  . INNER EAR SURGERY Left 1982  . INNER EAR SURGERY     severed a nerve in his left ear.   [name] LEFT HEART CATHETERIZATION WITH CORONARY ANGIOGRAM N/A 06/27/2013   Procedure: LEFT HEART CATHETERIZATION WITH CORONARY ANGIOGRAM;  Surgeon: Sinclair Grooms, MD;  Location: [facility] CATH LAB;  Service: Cardiovascular;  Laterality: N/A;  iabp insertion, cpr, intubation, emergent cvts  . TEE WITHOUT CARDIOVERSION N/A 06/27/2013   Procedure: TRANSESOPHAGEAL ECHOCARDIOGRAM (TEE);  Surgeon: Ivin Poot, MD;  Location: [place];  Service: Open Heart Surgery;  Laterality: N/A;  . TONSILLECTOMY    . TOTAL HIP ARTHROPLASTY Left 09/01/2014   Procedure: TOTAL HIP ARTHROPLASTY;  Surgeon: Kerin Salen, MD;  Location: [place];  Service: Orthopedics;  Laterality: Left;    Current Medications:  Current Meds  Medication Sig  . aspirin EC 81 MG tablet Take 1 tablet (81 mg total) by mouth daily. Swallow whole.  [name] atorvastatin (LIPITOR) 10 MG tablet Take 10 mg by mouth daily.  . furosemide (LASIX) 20 MG tablet Take 1 tablet (20 mg total) by mouth as directed. Take one tablet daily for one week and then only as needed.  . metoprolol tartrate (LOPRESSOR) 25 MG tablet Take 0.5 tablets (12.5 mg total) by mouth 2 (two) times daily.  . nitroGLYCERIN (NITROSTAT) 0.4 MG SL tablet Place 1 tablet (0.4 mg total) under the tongue every 5 (five) minutes as needed for chest pain.  [name] terazosin (HYTRIN) 5 MG capsule Take 5 mg by  mouth at bedtime.     Allergies:   Patient has no known allergies.   Social History   Socioeconomic History  . Marital status: Married    Spouse name: Not on file  . Number of children: Not on file  . Years of education: Not on file  . Highest education level: Not on file  Occupational History  . Not on file  Tobacco Use  . Smoking status: Never Smoker  . Smokeless tobacco: Never Used  Vaping Use  . Vaping Use: Never used  Substance and Sexual Activity  . Alcohol use: Yes    Comment: Rare  . Drug use: No  . Sexual activity: Not on file  Other Topics Concern  . Not on file  Social History Narrative  . Not on file   Social Determinants of Health   Financial Resource Strain: Not on file  Food Insecurity: Not on file  Transportation Needs: Not on file  Physical Activity: Not on file  Stress: Not on file  Social Connections: Not on file     Family History: The patient's family history includes Heart attack in his brother and sister; Heart disease in his brother and sister. There is no history of Colon cancer, Hypertension, or Stroke.  ROS:   Please see the history of present illness.    Decreased memory.  No shortness of breath.  No pain.  Difficulty ambulating.  All other systems reviewed and are negative.  EKGs/Labs/Other Studies Reviewed:    The following studies were reviewed today: No new cardiac data.  EKG:  EKG not performed today.  On 06/04/2020, normal sinus rhythm with right axis deviation was noted.  Recent Labs: 06/03/2020: ALT 12; BUN 20; Creatinine, Ser 1.52; Hemoglobin 13.2; Platelets 177; Potassium 4.1; Sodium 139; TSH 3.460  Recent Lipid Panel    Component Value Date/Time   CHOL 160 06/03/2020 1603   TRIG 143 06/03/2020 1603   HDL 35 (L) 06/03/2020 1603   CHOLHDL 4.6 06/03/2020 1603   CHOLHDL 3 08/29/2013 0752   VLDL 16.0 08/29/2013 0752   LDLCALC 100 (H) 06/03/2020 1603    Physical Exam:    VS:  BP 90/60 (BP Location: Left Arm,  Patient Position: Sitting, Cuff Size: Normal)   Pulse 82   Ht 5\' 10"  (1.778 m)   Wt 235 lb (106.6 kg)   SpO2 94%   BMI 33.72 kg/m     Wt Readings from Last 3 Encounters:  08/17/20 235 lb (106.6 kg)  06/03/20 230 lb 12.8 oz (104.7 kg)  08/07/19 177 lb 6.4 oz (80.5 kg)     GEN: Elderly and overweight. No acute distress HEENT: Normal NECK: No JVD. LYMPHATICS: No lymphadenopathy CARDIAC: No murmur. RRR no gallop, or edema. VASCULAR:  Normal Pulses. No bruits. RESPIRATORY:  Clear to auscultation without rales, wheezing or rhonchi  ABDOMEN: Soft, non-tender, non-distended, No pulsatile mass, MUSCULOSKELETAL: No deformity  SKIN: Warm and dry NEUROLOGIC:  Alert and oriented x 3 PSYCHIATRIC:  Normal affect   ASSESSMENT:    1. Coronary artery disease involving native coronary artery of native heart without angina pectoris   2. Mixed hyperlipidemia   3. Essential hypertension   4. Abdominal aortic aneurysm (AAA) without rupture ([place])   5. Educated about COVID-19 virus infection    PLAN:    In order of problems listed above:  1. Stable without angina.  Secondary prevention is being adhered to.  He is on low-dose Lipitor for LDL control and Lopressor with Hytrin has low normal blood pressures. 2. Continue atorvastatin 10 mg/day most recent LDL is 100. 3. Blood pressure is low normal. 4. He is status post abdominal aortic aneurysm endovascular stenting. 5. Vaccinated and practicing social distancing.  In review of records from November, including the EKG which shows right axis deviation, I feel that an echocardiogram should be done to rule out cor pulmonale/right heart failure in setting of bilateral edema.  Otherwise will need to be seen in 1 year.  Furosemide did not help edema, possibly because of the low dose.  Blood pressure is also soft and will be difficult to treat with diuretics on a chronic basis.   Medication Adjustments/Labs and Tests Ordered: Current medicines are  reviewed at length with the patient today.  Concerns regarding medicines are outlined above.  No orders of the defined types were placed in this encounter.  No orders of the defined types were placed in this encounter.   Patient Instructions  Medication Instructions:  Your physician recommends that you continue on your current medications as directed. Please refer to the Current Medication list given to you today.  *If you need a refill on your cardiac medications before your next appointment, please call your pharmacy*   Lab Work: None If you have labs (blood work) drawn today and your tests are completely normal, you will receive your results only by: [name] MyChart Message (if you have MyChart) OR . A paper copy in the mail If you have any lab test that is abnormal or we need to change your treatment, we will call you to review the results.   Testing/Procedures: None   Follow-Up: At [facility], you and your health needs are our priority.  As part of our continuing mission to provide you with exceptional heart care, we have created designated Provider Care Teams.  These Care Teams include your primary Cardiologist (physician) and Advanced Practice Providers (APPs -  Physician Assistants and Nurse Practitioners) who all work together to provide you with the care you need, when you need it.  We recommend signing up for the patient portal called "MyChart".  Sign up information is provided on this After Visit Summary.  MyChart is used to connect with patients for Virtual Visits (Telemedicine).  Patients are able to view lab/test results, encounter notes, upcoming appointments, etc.  Non-urgent messages can be sent to your provider as well.   To learn more about what you can do with MyChart, go to [email or web address].    Your next appointment:   1 year(s)  The format for your next appointment:   In Person  Provider:   You may see Sinclair Grooms, MD or one of the following  Advanced Practice Providers on your designated Care Team:    Cecilie Kicks, NP  Sharee Pimple  Glenford Peers, NP    Other Instructions      Signed, Sinclair Grooms, MD  08/17/2020 2:44 PM    [facility]

## 2020-08-17 ENCOUNTER — Ambulatory Visit (INDEPENDENT_AMBULATORY_CARE_PROVIDER_SITE_OTHER): Payer: Medicare Other | Admitting: Interventional Cardiology

## 2020-08-17 ENCOUNTER — Other Ambulatory Visit: Payer: Self-pay

## 2020-08-17 ENCOUNTER — Encounter: Payer: Self-pay | Admitting: Interventional Cardiology

## 2020-08-17 VITALS — BP 90/60 | HR 82 | Ht 70.0 in | Wt 235.0 lb

## 2020-08-17 DIAGNOSIS — E782 Mixed hyperlipidemia: Secondary | ICD-10-CM | POA: Diagnosis not present

## 2020-08-17 DIAGNOSIS — R600 Localized edema: Secondary | ICD-10-CM

## 2020-08-17 DIAGNOSIS — I2510 Atherosclerotic heart disease of native coronary artery without angina pectoris: Secondary | ICD-10-CM

## 2020-08-17 DIAGNOSIS — Z7189 Other specified counseling: Secondary | ICD-10-CM

## 2020-08-17 DIAGNOSIS — I714 Abdominal aortic aneurysm, without rupture, unspecified: Secondary | ICD-10-CM

## 2020-08-17 DIAGNOSIS — I10 Essential (primary) hypertension: Secondary | ICD-10-CM

## 2020-08-17 NOTE — Patient Instructions (Signed)
Medication Instructions:  Your physician recommends that you continue on your current medications as directed. Please refer to the Current Medication list given to you today.  *If you need a refill on your cardiac medications before your next appointment, please call your pharmacy*   Lab Work: None If you have labs (blood work) drawn today and your tests are completely normal, you will receive your results only by: . MyChart Message (if you have MyChart) OR . A paper copy in the mail If you have any lab test that is abnormal or we need to change your treatment, we will call you to review the results.   Testing/Procedures: None   Follow-Up: At [facility], you and your health needs are our priority.  As part of our continuing mission to provide you with exceptional heart care, we have created designated Provider Care Teams.  These Care Teams include your primary Cardiologist (physician) and Advanced Practice Providers (APPs -  Physician Assistants and Nurse Practitioners) who all work together to provide you with the care you need, when you need it.  We recommend signing up for the patient portal called "MyChart".  Sign up information is provided on this After Visit Summary.  MyChart is used to connect with patients for Virtual Visits (Telemedicine).  Patients are able to view lab/test results, encounter notes, upcoming appointments, etc.  Non-urgent messages can be sent to your provider as well.   To learn more about what you can do with MyChart, go to [email or web address].    Your next appointment:   1 year(s)  The format for your next appointment:   In Person  Provider:   You may see Henry W Smith III, MD or one of the following Advanced Practice Providers on your designated Care Team:    Laura Ingold, NP  Jill McDaniel, NP    Other Instructions   

## 2020-08-17 NOTE — Addendum Note (Signed)
Addended by: Loren Racer on: 08/17/2020 02:53 PM   Modules accepted: Orders

## 2020-09-16 ENCOUNTER — Ambulatory Visit (HOSPITAL_COMMUNITY): Payer: Medicare Other | Attending: Cardiology

## 2020-09-17 ENCOUNTER — Encounter (HOSPITAL_COMMUNITY): Payer: Self-pay | Admitting: Interventional Cardiology

## 2020-09-18 ENCOUNTER — Ambulatory Visit: Payer: Medicare Other | Admitting: Interventional Cardiology

## 2020-10-20 ENCOUNTER — Ambulatory Visit (HOSPITAL_COMMUNITY): Payer: Medicare Other | Attending: Internal Medicine

## 2020-10-20 ENCOUNTER — Other Ambulatory Visit: Payer: Self-pay

## 2020-10-20 DIAGNOSIS — I2510 Atherosclerotic heart disease of native coronary artery without angina pectoris: Secondary | ICD-10-CM | POA: Insufficient documentation

## 2020-10-20 DIAGNOSIS — R600 Localized edema: Secondary | ICD-10-CM | POA: Diagnosis present

## 2020-10-20 LAB — ECHOCARDIOGRAM COMPLETE
AR max vel: 2.57 cm2
AV Area VTI: 2.58 cm2
AV Area mean vel: 2.62 cm2
AV Mean grad: 6 mmHg
AV Peak grad: 10.7 mmHg
Ao pk vel: 1.64 m/s
Area-P 1/2: 3.12 cm2
S' Lateral: 3 cm

## 2020-10-20 MED ORDER — PERFLUTREN LIPID MICROSPHERE
1.0000 mL | INTRAVENOUS | Status: AC | PRN
  Administered 2020-10-20: 1 mL via INTRAVENOUS

## 2021-03-24 ENCOUNTER — Emergency Department (HOSPITAL_COMMUNITY): Payer: Medicare Other

## 2021-03-24 ENCOUNTER — Other Ambulatory Visit: Payer: Self-pay

## 2021-03-24 ENCOUNTER — Inpatient Hospital Stay (HOSPITAL_COMMUNITY)
Admission: EM | Admit: 2021-03-24 | Discharge: 2021-03-30 | DRG: 069 | Disposition: A | Discharge status: Skilled Nursing Facility | Payer: Medicare Other | Attending: Student in an Organized Health Care Education/Training Program | Admitting: Student in an Organized Health Care Education/Training Program

## 2021-03-24 ENCOUNTER — Encounter (HOSPITAL_COMMUNITY): Payer: Self-pay

## 2021-03-24 ENCOUNTER — Observation Stay (HOSPITAL_COMMUNITY): Payer: Medicare Other

## 2021-03-24 DIAGNOSIS — Z7982 Long term (current) use of aspirin: Secondary | ICD-10-CM

## 2021-03-24 DIAGNOSIS — H9190 Unspecified hearing loss, unspecified ear: Secondary | ICD-10-CM | POA: Diagnosis present

## 2021-03-24 DIAGNOSIS — E669 Obesity, unspecified: Secondary | ICD-10-CM | POA: Diagnosis present

## 2021-03-24 DIAGNOSIS — I6521 Occlusion and stenosis of right carotid artery: Secondary | ICD-10-CM | POA: Insufficient documentation

## 2021-03-24 DIAGNOSIS — G459 Transient cerebral ischemic attack, unspecified: Secondary | ICD-10-CM | POA: Diagnosis not present

## 2021-03-24 DIAGNOSIS — R5381 Other malaise: Secondary | ICD-10-CM | POA: Diagnosis present

## 2021-03-24 DIAGNOSIS — I639 Cerebral infarction, unspecified: Secondary | ICD-10-CM | POA: Diagnosis not present

## 2021-03-24 DIAGNOSIS — I2510 Atherosclerotic heart disease of native coronary artery without angina pectoris: Secondary | ICD-10-CM | POA: Diagnosis present

## 2021-03-24 DIAGNOSIS — N529 Male erectile dysfunction, unspecified: Secondary | ICD-10-CM | POA: Diagnosis present

## 2021-03-24 DIAGNOSIS — Z8249 Family history of ischemic heart disease and other diseases of the circulatory system: Secondary | ICD-10-CM

## 2021-03-24 DIAGNOSIS — Z6832 Body mass index (BMI) 32.0-32.9, adult: Secondary | ICD-10-CM

## 2021-03-24 DIAGNOSIS — E785 Hyperlipidemia, unspecified: Secondary | ICD-10-CM | POA: Diagnosis present

## 2021-03-24 DIAGNOSIS — R29705 NIHSS score 5: Secondary | ICD-10-CM | POA: Diagnosis present

## 2021-03-24 DIAGNOSIS — Z96642 Presence of left artificial hip joint: Secondary | ICD-10-CM | POA: Diagnosis present

## 2021-03-24 DIAGNOSIS — Z79899 Other long term (current) drug therapy: Secondary | ICD-10-CM

## 2021-03-24 DIAGNOSIS — R29810 Facial weakness: Secondary | ICD-10-CM | POA: Diagnosis present

## 2021-03-24 DIAGNOSIS — I714 Abdominal aortic aneurysm, without rupture: Secondary | ICD-10-CM | POA: Diagnosis present

## 2021-03-24 DIAGNOSIS — Z951 Presence of aortocoronary bypass graft: Secondary | ICD-10-CM

## 2021-03-24 DIAGNOSIS — I878 Other specified disorders of veins: Secondary | ICD-10-CM

## 2021-03-24 DIAGNOSIS — U071 COVID-19: Secondary | ICD-10-CM | POA: Diagnosis present

## 2021-03-24 DIAGNOSIS — Z974 Presence of external hearing-aid: Secondary | ICD-10-CM

## 2021-03-24 DIAGNOSIS — R0600 Dyspnea, unspecified: Secondary | ICD-10-CM

## 2021-03-24 DIAGNOSIS — K219 Gastro-esophageal reflux disease without esophagitis: Secondary | ICD-10-CM | POA: Diagnosis present

## 2021-03-24 DIAGNOSIS — I69354 Hemiplegia and hemiparesis following cerebral infarction affecting left non-dominant side: Secondary | ICD-10-CM

## 2021-03-24 DIAGNOSIS — I10 Essential (primary) hypertension: Secondary | ICD-10-CM | POA: Diagnosis present

## 2021-03-24 LAB — COMPREHENSIVE METABOLIC PANEL
ALT: 10 U/L (ref 0–44)
AST: 14 U/L — ABNORMAL LOW (ref 15–41)
Albumin: 2.7 g/dL — ABNORMAL LOW (ref 3.5–5.0)
Alkaline Phosphatase: 45 U/L (ref 38–126)
Anion gap: 5 (ref 5–15)
BUN: 26 mg/dL — ABNORMAL HIGH (ref 8–23)
CO2: 25 mmol/L (ref 22–32)
Calcium: 7.8 mg/dL — ABNORMAL LOW (ref 8.9–10.3)
Chloride: 107 mmol/L (ref 98–111)
Creatinine, Ser: 1.53 mg/dL — ABNORMAL HIGH (ref 0.61–1.24)
GFR, Estimated: 43 mL/min — ABNORMAL LOW (ref >60)
Glucose, Bld: 79 mg/dL (ref 70–99)
Potassium: 3.8 mmol/L (ref 3.5–5.1)
Sodium: 137 mmol/L (ref 135–145)
Total Bilirubin: 0.9 mg/dL (ref 0.3–1.2)
Total Protein: 5.4 g/dL — ABNORMAL LOW (ref 6.5–8.1)

## 2021-03-24 LAB — I-STAT CHEM 8, ED
BUN: 39 mg/dL — ABNORMAL HIGH (ref 8–23)
Calcium, Ion: 0.97 mmol/L — ABNORMAL LOW (ref 1.15–1.40)
Chloride: 110 mmol/L (ref 98–111)
Creatinine, Ser: 1.3 mg/dL — ABNORMAL HIGH (ref 0.61–1.24)
Glucose, Bld: 63 mg/dL — ABNORMAL LOW (ref 70–99)
HCT: 32 % — ABNORMAL LOW (ref 39.0–52.0)
Hemoglobin: 10.9 g/dL — ABNORMAL LOW (ref 13.0–17.0)
Potassium: 5.7 mmol/L — ABNORMAL HIGH (ref 3.5–5.1)
Sodium: 140 mmol/L (ref 135–145)
TCO2: 22 mmol/L (ref 22–32)

## 2021-03-24 LAB — DIFFERENTIAL
Abs Immature Granulocytes: 0.02 10*3/uL (ref 0.00–0.07)
Basophils Absolute: 0.1 10*3/uL (ref 0.0–0.1)
Basophils Relative: 1 %
Eosinophils Absolute: 0.1 10*3/uL (ref 0.0–0.5)
Eosinophils Relative: 2 %
Immature Granulocytes: 0 %
Lymphocytes Relative: 31 %
Lymphs Abs: 2.2 10*3/uL (ref 0.7–4.0)
Monocytes Absolute: 0.8 10*3/uL (ref 0.1–1.0)
Monocytes Relative: 11 %
Neutro Abs: 4 10*3/uL (ref 1.7–7.7)
Neutrophils Relative %: 55 %

## 2021-03-24 LAB — CBC
HCT: 34.9 % — ABNORMAL LOW (ref 39.0–52.0)
Hemoglobin: 11.2 g/dL — ABNORMAL LOW (ref 13.0–17.0)
MCH: 30.9 pg (ref 26.0–34.0)
MCHC: 32.1 g/dL (ref 30.0–36.0)
MCV: 96.1 fL (ref 80.0–100.0)
Platelets: 182 10*3/uL (ref 150–400)
RBC: 3.63 MIL/uL — ABNORMAL LOW (ref 4.22–5.81)
RDW: 14.1 % (ref 11.5–15.5)
WBC: 7.1 10*3/uL (ref 4.0–10.5)
nRBC: 0 % (ref 0.0–0.2)

## 2021-03-24 LAB — APTT: aPTT: 31 seconds (ref 24–36)

## 2021-03-24 LAB — PROTIME-INR
INR: 1.1 (ref 0.8–1.2)
Prothrombin Time: 14.5 seconds (ref 11.4–15.2)

## 2021-03-24 LAB — CBG MONITORING, ED: Glucose-Capillary: 71 mg/dL (ref 70–99)

## 2021-03-24 MED ORDER — CLOPIDOGREL BISULFATE 75 MG PO TABS
75.0000 mg | ORAL_TABLET | Freq: Every day | ORAL | Status: DC
  Administered 2021-03-24 – 2021-03-30 (×7): 75 mg via ORAL
  Filled 2021-03-24 (×7): qty 1

## 2021-03-24 MED ORDER — ASPIRIN 325 MG PO TABS
325.0000 mg | ORAL_TABLET | Freq: Every day | ORAL | Status: DC
  Administered 2021-03-24: 325 mg via ORAL
  Filled 2021-03-24: qty 1

## 2021-03-24 MED ORDER — SODIUM CHLORIDE 0.9% FLUSH
3.0000 mL | Freq: Once | INTRAVENOUS | Status: AC
  Administered 2021-03-24: 3 mL via INTRAVENOUS

## 2021-03-24 MED ORDER — ENOXAPARIN SODIUM 40 MG/0.4ML IJ SOSY
40.0000 mg | PREFILLED_SYRINGE | INTRAMUSCULAR | Status: DC
  Administered 2021-03-24 – 2021-03-29 (×6): 40 mg via SUBCUTANEOUS
  Filled 2021-03-24 (×6): qty 0.4

## 2021-03-24 MED ORDER — IOHEXOL 350 MG/ML SOLN
75.0000 mL | Freq: Once | INTRAVENOUS | Status: AC | PRN
  Administered 2021-03-24: 75 mL via INTRAVENOUS

## 2021-03-24 MED ORDER — ACETAMINOPHEN 160 MG/5ML PO SOLN: 650.0000 mg | ORAL | Status: DC | PRN

## 2021-03-24 MED ORDER — ACETAMINOPHEN 650 MG RE SUPP: 650.0000 mg | RECTAL | Status: DC | PRN

## 2021-03-24 MED ORDER — SENNOSIDES-DOCUSATE SODIUM 8.6-50 MG PO TABS: 1.0000 | ORAL_TABLET | Freq: Every evening | ORAL | Status: DC | PRN

## 2021-03-24 MED ORDER — STROKE: EARLY STAGES OF RECOVERY BOOK
Freq: Once | Status: DC
  Filled 2021-03-24: qty 1

## 2021-03-24 MED ORDER — ACETAMINOPHEN 325 MG PO TABS: 650.0000 mg | ORAL_TABLET | ORAL | Status: DC | PRN

## 2021-03-24 NOTE — ED Triage Notes (Signed)
Pt BIB EMS from home C/O L facial droop, LLE and LUE weakness, slurred speech. LKW 1125 today.

## 2021-03-24 NOTE — ED Notes (Signed)
Attempted to call report X 1. 

## 2021-03-24 NOTE — Code Documentation (Signed)
Stroke Response Nurse Documentation Code Documentation  Tyler [name] is a 87 y.o. male arriving to [place] ED via [facility] on 03/24/21 with past medical hx of essential hypertension, hyperlipidemia, stroke, abdominal aortic aneurysm and hard of hearing. Per wife, he tested positive COVID (+) 8/25. Code stroke was activated by EMS. Patient from home where he was LKW at 1125 and now complaining of left facial droop and arm/leg weakness.   Stroke team at the bedside on patient arrival. Labs drawn and patient cleared for CT by Dr. Ronnald Nian. Patient to CT with team. NIHSS 7, see documentation for details and code stroke times. Patient with disoriented, left facial droop, left arm weakness, Expressive aphasia , and dysarthria  on exam.   The following imaging was completed: CT, CTA head and neck. Delay in IV as patient is a difficult stick. Ultrasound equipment used. Patient is not a candidate for tnk due to improving symptoms. Care/Plan monitor Q15 VS with Q30 neuro until out of the window for tnk at 1600. Bedside handoff with ED RN [place].    Leverne Humbles Stroke Response RN

## 2021-03-24 NOTE — ED Notes (Signed)
Pt hooked and up admitted to bedside cardiac monitor. Bed in lowest position and locked with bed rails up X 2. Call light within reach. Spouse at bedside. No S/S of distress noted.

## 2021-03-24 NOTE — Consult Note (Addendum)
Neurology Consultation  Reason for Consult: Code Stroke for left-sided weakness, left mouth droop, and dysarthria Referring Physician: Dr. Ronnald Nian  CC: Left-sided weakness, dysarthria  History is obtained from: Chart review, EMS, patient  HPI: Tyler [name] is a 87 y.o. male with a medical history significant for remote stroke without deficits, hyperlipidemia, essential hypertension, CAD s/p CABG x 4 in 2014, AAA s/p stent grafting in 2015, osteoarthritis, and GERD who presented to the ED for evaluation of left-sided weakness and slurred speech. Patient's wife witnessed the onset of left facial droop, left hemiparesis, and slurred speech at 11:25 this morning and activated EMS. On EMS arrival, his symptoms had improved from minimal movement of left lower extremity to strength almost at baseline. On arrival to the ED, patient had minimal residual left upper extremity weakness with minimal vertical drift, left facial droop, and mild dysarthria.   At baseline Tyler [name] ambulates with assistance from a walker. His wife manages his medication and helps him to get into the shower. She states that he also needs assistance after using the restroom to get cleaned up.   LKW: 11:25 TNK given?: no, symptoms with rapid improvement with residual deficits too mild to treat. IR Thrombectomy? No,  Modified Rankin Scale: 4-Needs assistance to walk and tend to bodily needs  ROS: A complete ROS was performed and is negative except as noted in the HPI.   Past Medical History:  Diagnosis Date   AAA (abdominal aortic aneurysm) ([place])    a. 05/2014 s/p stent grafting [facility]).   Bronchitis    CAD (coronary artery disease)    a. 06/2013 VF Arrest/Cath: 3VD, EF 50%;  b. 06/2013 CABG x 4: LIMA->LAD, VG->PDA->RPL, VG->OM.   CVA (cerebral infarction)    Erectile dysfunction    Essential hypertension    GERD (gastroesophageal reflux disease)    H/O hiatal hernia    HOH (hard of hearing)    wears hearing  aid in right ear   Hyperlipidemia    Osteoarthritis    Pneumonia    YRS AGO   Stroke [facility])    Past Surgical History:  Procedure Laterality Date   CORONARY ARTERY BYPASS GRAFT N/A 06/27/2013   Procedure: CORONARY ARTERY BYPASS GRAFTING (CABG);  Surgeon: Ivin Poot, MD;  Location: [place];  Service: Open Heart Surgery;  Laterality: N/A;  times four utilizing the left internal mammary artery and the right greater saphenous vein harvested endoscopically   ENDOVEIN HARVEST OF GREATER SAPHENOUS VEIN Right 06/27/2013   Procedure: ENDOVEIN HARVEST OF GREATER SAPHENOUS VEIN;  Surgeon: Ivin Poot, MD;  Location: [place];  Service: Open Heart Surgery;  Laterality: Right;   EYE SURGERY     bilateral cataracts   INNER EAR SURGERY Left 1982   INNER EAR SURGERY     severed a nerve in his left ear.    LEFT HEART CATHETERIZATION WITH CORONARY ANGIOGRAM N/A 06/27/2013   Procedure: LEFT HEART CATHETERIZATION WITH CORONARY ANGIOGRAM;  Surgeon: Sinclair Grooms, MD;  Location: [facility] CATH LAB;  Service: Cardiovascular;  Laterality: N/A;  iabp insertion, cpr, intubation, emergent cvts   TEE WITHOUT CARDIOVERSION N/A 06/27/2013   Procedure: TRANSESOPHAGEAL ECHOCARDIOGRAM (TEE);  Surgeon: Ivin Poot, MD;  Location: [place];  Service: Open Heart Surgery;  Laterality: N/A;   TONSILLECTOMY     TOTAL HIP ARTHROPLASTY Left 09/01/2014   Procedure: TOTAL HIP ARTHROPLASTY;  Surgeon: Kerin Salen, MD;  Location: [place];  Service: Orthopedics;  Laterality: Left;  Family History  Problem Relation Age of Onset   Heart disease Sister    Heart disease Brother    Heart attack Sister    Heart attack Brother    Colon cancer Neg Hx    Hypertension Neg Hx    Stroke Neg Hx    Social History:   reports that he has never smoked. He has never used smokeless tobacco. He reports current alcohol use. He reports that he does not use drugs.  Medications  Current Facility-Administered Medications:    iohexol (OMNIPAQUE) 350  MG/ML injection 75 mL, 75 mL, Intravenous, Once PRN, Derek Jack, MD   sodium chloride flush (NS) 0.9 % injection 3 mL, 3 mL, Intravenous, Once, [place], Adam, DO  Current Outpatient Medications:    aspirin EC 81 MG tablet, Take 1 tablet (81 mg total) by mouth daily. Swallow whole., Disp: 90 tablet, Rfl: 3   atorvastatin (LIPITOR) 10 MG tablet, Take 10 mg by mouth daily., Disp: , Rfl:    furosemide (LASIX) 20 MG tablet, Take 1 tablet (20 mg total) by mouth as directed. Take one tablet daily for one week and then only as needed., Disp: 90 tablet, Rfl: 3   metoprolol tartrate (LOPRESSOR) 25 MG tablet, Take 0.5 tablets (12.5 mg total) by mouth 2 (two) times daily., Disp: 90 tablet, Rfl: 3   nitroGLYCERIN (NITROSTAT) 0.4 MG SL tablet, Place 1 tablet (0.4 mg total) under the tongue every 5 (five) minutes as needed for chest pain., Disp: 25 tablet, Rfl: 3   terazosin (HYTRIN) 5 MG capsule, Take 5 mg by mouth at bedtime., Disp: , Rfl:   Exam: Current vital signs: BP (!) 91/46   Pulse 67   Resp 18   Wt 104 kg   SpO2 98%   BMI 32.90 kg/m  Vital signs in last 24 hours: Pulse Rate:  [67] 67 (08/31 1225) Resp:  [18] 18 (08/31 1225) BP: (91)/(46) 91/46 (08/31 1225) SpO2:  [98 %] 98 % (08/31 1225) Weight:  [104 kg] 104 kg (08/31 1200)  GENERAL: Awake, alert, in no acute distress Psych: Affect appropriate for situation, patient is calm and cooperative with examination Head: Normocephalic and atraumatic, without obvious abnormality EENT: Normal conjunctivae, dry mucous membranes, no OP obstruction LUNGS: Normal respiratory effort. Non-labored breathing on room air CV: Regular rate and rhythm on telemetry ABDOMEN: Soft, non-tender, non-distended Extremities: warm, well perfused, without obvious deformity  NEURO:  Mental Status: Awake, alert, and oriented to person. He incorrectly states that he is 87 years old and he is unable to state the month. He incorrectly states that the year is 2002.   He/She is able to provide a clear and coherent history of present illness. Speech/Language: speech is dysarthric.  Naming is intact and repetition is intact with consistent omission of 1-2 words.  No neglect is noted Cranial Nerves:  II: PERRL 3 mm/brisk. Visual fields full.  III, IV, VI: EOMI without gaze preference, ptosis, or nystagmus.  V: Sensation is intact to light touch and symmetrical to face. Blinks to threat.  VII: Face is asymmetric resting and smiling with left mouth droop  VIII: Hearing is intact to loud voice IX, X: Palate elevation is symmetric. Phonation normal.  XI: Normal sternocleidomastoid and trapezius muscle strength XII: Tongue protrudes midline without fasciculations.   Motor: 5/5 strength present in right upper extremity without vertical drift Left upper extremity has minimal weakness with subtle vertical drift on assessment 4/5 strength Bilateral lower extremities are chronically weak with 4/5  strength but able to withstand antigravity movement without vertical drift on assessment. Tone is normal. Bulk is normal.  Sensation: Intact to light touch bilaterally in all four extremities.  Coordination: FTN and HKS are bradykinetic without dysmetria DTRs: 2+ and symmetric biceps and brachioradialis Gait: Deferred  NIHSS: 1a Level of Conscious.: 0 1b LOC Questions: 2 1c LOC Commands: 0 2 Best Gaze: 0 3 Visual: 0 4 Facial Palsy: 1 5a Motor Arm - left: 1 5b Motor Arm - Right: 0 6a Motor Leg - Left: 0 6b Motor Leg - Right: 0 7 Limb Ataxia: 0 8 Sensory: 0 9 Best Language: 0 10 Dysarthria: 1 11 Extinct. and Inatten.: 0 TOTAL: 5  Labs I have reviewed labs in epic and the results pertinent to this consultation are: CBC    Component Value Date/Time   WBC 7.1 03/24/2021 1210   RBC 3.63 (L) 03/24/2021 1210   HGB 10.9 (L) 03/24/2021 1217   HGB 13.2 06/03/2020 1603   HCT 32.0 (L) 03/24/2021 1217   HCT 40.1 06/03/2020 1603   PLT 182 03/24/2021 1210   PLT  177 06/03/2020 1603   MCV 96.1 03/24/2021 1210   MCV 92 06/03/2020 1603   MCH 30.9 03/24/2021 1210   MCHC 32.1 03/24/2021 1210   RDW 14.1 03/24/2021 1210   RDW 14.3 06/03/2020 1603   LYMPHSABS 2.2 03/24/2021 1210   MONOABS 0.8 03/24/2021 1210   EOSABS 0.1 03/24/2021 1210   BASOSABS 0.1 03/24/2021 1210   CMP     Component Value Date/Time   NA 140 03/24/2021 1217   NA 139 06/03/2020 1603   K 5.7 (H) 03/24/2021 1217   CL 110 03/24/2021 1217   CO2 22 06/03/2020 1603   GLUCOSE 63 (L) 03/24/2021 1217   BUN 39 (H) 03/24/2021 1217   BUN 20 06/03/2020 1603   CREATININE 1.30 (H) 03/24/2021 1217   CALCIUM 8.8 06/03/2020 1603   PROT 6.5 06/03/2020 1603   ALBUMIN 4.1 06/03/2020 1603   AST 17 06/03/2020 1603   ALT 12 06/03/2020 1603   ALKPHOS 59 06/03/2020 1603   BILITOT 0.5 06/03/2020 1603   GFRNONAA 40 (L) 06/03/2020 1603   GFRAA 46 (L) 06/03/2020 1603   Lipid Panel     Component Value Date/Time   CHOL 160 06/03/2020 1603   TRIG 143 06/03/2020 1603   HDL 35 (L) 06/03/2020 1603   CHOLHDL 4.6 06/03/2020 1603   CHOLHDL 3 08/29/2013 0752   VLDL 16.0 08/29/2013 0752   LDLCALC 100 (H) 06/03/2020 1603   Imaging I have reviewed the images obtained:  CT-scan of the brain 03/24/2021: There is no acute intracranial hemorrhage or evidence of acute infarction. ASPECT score is 10. Chronic infarcts, chronic microvascular ischemic changes, and generalized parenchymal volume loss.  CT angio head and neck wwo contrast 03/24/2021: Occlusion of the cervical right internal carotid artery at its origin with thin string of contrast through the midportion and partial reconstitution distally. Intracranially, there is diminished flow with greater reconstitution at the level of the cavernous segment. Mixed but primarily noncalcified plaque along the proximal left ICA causes 75% stenosis. Suspected high-grade stenosis at the right vertebral origin. Moderate to marked focal stenosis proximal right A1  ACA.  Assessment: 87 y.o. male who presented from home via EMS as a Code Stroke for left hemiparesis, left mouth droop, and dysarthria.  - Examination reveals patient with largely resolved left hemiparesis with minimal left upper extremity weakness, mild dysarthria, and left mouth droop who is not oriented  to month, year, or age. NIHSS of 5.  - CTH imaging is without acute intracranial abnormalities but reveals chronic infarcts, chronic microvascular ischemic changes, and generalized parenchymal volume loss.  - CT angio imaging revealed occlusion of the right internal carotid artery at its origin with a thin string of contrast through the midportion and partial reconstitution distally. Presentation etiology of left sided deficits likely 2/2 right ICA occlusion with reconstitution. Additionally, angio imaging reveals plaque along the proximal left ICA with 75% stenosis, high grade stenosis of the right vertebral origin, and moderate to marked focal stenosis at the proximal right A1 ACA.  - The decision was made to not give TNK due to rapidly improving symptoms and residual symptom of left mouth droop, subtle left arm weakness, and dysarthria (baseline per patient's wife at bedside) felt to be too mild to treat, however, with frequent neurologic reassessment while in thrombolytic therapy window for potential administration with any symptom worsening. With rapid improvement in symptom presentation, NIHSS of 5, mRS of 4, and CT angio imaging revealing partial reconstitution of right ICA occlusion, patient is not a candidate for IR thrombectomy at this time.  - Stroke risk factors include patient's advanced age, history of strokes, history of hyperlipidemia, hypertension, and CAD requiring CABG.   Impression: Left hemiparesis- improving Worsening dysarthria from baseline- resolved and speech is at baseline per patient's wife Left facial droop Right ICA occlusion with distal  reconstitution  Recommendations: - HgbA1c, fasting lipid panel - MRI of the brain without contrast - Frequent neuro checks - Echocardiogram - Permissive hypertension for 24-48 hours from symptom onset, treat SBP > 220  - Prophylactic therapy- Antiplatelet med: DAPT ASA 325 mg PO now followed by ASA 81 mg PO daily in addition to clopidogrel 300 mg PO once and 75 mg PO daily for 21 days.  (After outside of window)  - Risk factor modification - Telemetry monitoring; consider 30 day event monitor if no arrhythmia captured while inpatient - PT consult, OT consult, Speech consult - Stroke team to follow - Appreciate vascular recommendation regarding whether a nonemergent angiogram of R ICA to determine if completely occluded and if not whether intervention would be warranted this hospitalization.  Anibal Henderson, AGAC-NP [facility] Pager: [phone]  Neurology Attending Attestation   I examined the patient and discussed plan with Anibal Henderson. Above note has been edited by me to reflect my findings and recommendations. I was present throughout the stroke code and made all significant decisions and personally reviewed CNS imaging and also discussed CTA results with radiologist by phone.    This patient is critically ill and at significant risk of neurological worsening, death and care requires constant monitoring of vital signs, hemodynamics,respiratory and cardiac monitoring, neurological assessment, discussion with family, other specialists and medical decision making of high complexity. I spent 60 minutes of neurocritical care time  in the care of  this patient. This was time spent independent of any time provided by nurse practitioner or PA.   Su Monks, MD [facility] [phone]   If 7pm- 7am, please page neurology on call as listed in [place].  Attending Addendum 03/24/21 @ 1600  D/w Dr. Dr. Karenann Cai of Neuro[facility]. If patient's  condition deteriorates intervention could potentially be considered inpatient at that time. If he remains stable then he should be referred to the neurointerventional team for outpatient evaluation of potential carotid revascularization.  Su Monks, MD [facility] [phone]  If 7pm- 7am, please page neurology on call  as listed in [place].

## 2021-03-24 NOTE — ED Notes (Signed)
Neuro NP at bedside updating pt and spouse on plan of care.

## 2021-03-24 NOTE — ED Provider Notes (Signed)
[facility] EMERGENCY DEPARTMENT Provider Note   CSN: [id number] Arrival date & time: 03/24/21  1207  An emergency department physician performed an initial assessment on this suspected stroke patient at 1209.  History Chief Complaint  Patient presents with   Code Stroke    Tyler [name] is a 87 y.o. male.  Patient with left-sided weakness that occurred just prior to arrival.  Left facial droop, left arm weakness, left leg weakness.  Had difficulty with speech.  EMS states that this has been improving rapidly.  Still has some left-sided facial weakness.  History of stroke not on blood thinners.  History of CAD.  Last known normal seems to be around 1130  The history is provided by the patient and the EMS personnel.  Neurologic Problem This is a new problem. The current episode started less than 1 hour ago. The problem has been rapidly improving. Pertinent negatives include no chest pain, no abdominal pain, no headaches and no shortness of breath. Nothing aggravates the symptoms. Nothing relieves the symptoms. He has tried nothing for the symptoms. The treatment provided no relief.      Past Medical History:  Diagnosis Date   AAA (abdominal aortic aneurysm) ([place])    a. 05/2014 s/p stent grafting [facility]).   Bronchitis    CAD (coronary artery disease)    a. 06/2013 VF Arrest/Cath: 3VD, EF 50%;  b. 06/2013 CABG x 4: LIMA->LAD, VG->PDA->RPL, VG->OM.   CVA (cerebral infarction)    Erectile dysfunction    Essential hypertension    GERD (gastroesophageal reflux disease)    H/O hiatal hernia    HOH (hard of hearing)    wears hearing aid in right ear   Hyperlipidemia    Osteoarthritis    Pneumonia    YRS AGO   Stroke [facility])     Patient Active Problem List   Diagnosis Date Noted   Abdominal distension 09/03/2014   Abdominal pain 09/03/2014   Arthritis of left hip 09/01/2014   AAA (abdominal aortic aneurysm) ([facility])    Essential hypertension    CAD  (coronary artery disease)    Cellulitis 07/08/2013   S/P CABG x 4 06/27/2013   Hyperlipidemia    Cerebral infarction [facility])     Past Surgical History:  Procedure Laterality Date   CORONARY ARTERY BYPASS GRAFT N/A 06/27/2013   Procedure: CORONARY ARTERY BYPASS GRAFTING (CABG);  Surgeon: Ivin Poot, MD;  Location: [place];  Service: Open Heart Surgery;  Laterality: N/A;  times four utilizing the left internal mammary artery and the right greater saphenous vein harvested endoscopically   ENDOVEIN HARVEST OF GREATER SAPHENOUS VEIN Right 06/27/2013   Procedure: ENDOVEIN HARVEST OF GREATER SAPHENOUS VEIN;  Surgeon: Ivin Poot, MD;  Location: [place];  Service: Open Heart Surgery;  Laterality: Right;   EYE SURGERY     bilateral cataracts   INNER EAR SURGERY Left 1982   INNER EAR SURGERY     severed a nerve in his left ear.    LEFT HEART CATHETERIZATION WITH CORONARY ANGIOGRAM N/A 06/27/2013   Procedure: LEFT HEART CATHETERIZATION WITH CORONARY ANGIOGRAM;  Surgeon: Sinclair Grooms, MD;  Location: [facility] CATH LAB;  Service: Cardiovascular;  Laterality: N/A;  iabp insertion, cpr, intubation, emergent cvts   TEE WITHOUT CARDIOVERSION N/A 06/27/2013   Procedure: TRANSESOPHAGEAL ECHOCARDIOGRAM (TEE);  Surgeon: Ivin Poot, MD;  Location: [place];  Service: Open Heart Surgery;  Laterality: N/A;   TONSILLECTOMY     TOTAL HIP  ARTHROPLASTY Left 09/01/2014   Procedure: TOTAL HIP ARTHROPLASTY;  Surgeon: Kerin Salen, MD;  Location: [place];  Service: Orthopedics;  Laterality: Left;       Family History  Problem Relation Age of Onset   Heart disease Sister    Heart disease Brother    Heart attack Sister    Heart attack Brother    Colon cancer Neg Hx    Hypertension Neg Hx    Stroke Neg Hx     Social History   Tobacco Use   Smoking status: Never   Smokeless tobacco: Never  Vaping Use   Vaping Use: Never used  Substance Use Topics   Alcohol use: Yes    Comment: Rare   Drug use: No     Home Medications Prior to Admission medications   Medication Sig Start Date End Date Taking? Authorizing Provider  aspirin EC 81 MG tablet Take 1 tablet (81 mg total) by mouth daily. Swallow whole. 06/03/20   Burtis Junes, NP  atorvastatin (LIPITOR) 10 MG tablet Take 10 mg by mouth daily.    [provider]  furosemide (LASIX) 20 MG tablet Take 1 tablet (20 mg total) by mouth as directed. Take one tablet daily for one week and then only as needed. 06/03/20 09/01/20  Burtis Junes, NP  metoprolol tartrate (LOPRESSOR) 25 MG tablet Take 0.5 tablets (12.5 mg total) by mouth 2 (two) times daily. 08/29/13   Belva Crome, MD  nitroGLYCERIN (NITROSTAT) 0.4 MG SL tablet Place 1 tablet (0.4 mg total) under the tongue every 5 (five) minutes as needed for chest pain. 08/07/19   Belva Crome, MD  terazosin (HYTRIN) 5 MG capsule Take 5 mg by mouth at bedtime.    [provider]    Allergies    Patient has no known allergies.  Review of Systems   Review of Systems  Constitutional:  Negative for chills and fever.  HENT:  Negative for ear pain and sore throat.   Eyes:  Negative for pain and visual disturbance.  Respiratory:  Negative for cough and shortness of breath.   Cardiovascular:  Negative for chest pain and palpitations.  Gastrointestinal:  Negative for abdominal pain and vomiting.  Genitourinary:  Negative for dysuria and hematuria.  Musculoskeletal:  Negative for arthralgias and back pain.  Skin:  Negative for color change and rash.  Neurological:  Positive for facial asymmetry, speech difficulty and weakness. Negative for dizziness, seizures, syncope and headaches.  All other systems reviewed and are negative.  Physical Exam Updated Vital Signs BP 137/81   Pulse (!) 58   Temp 97.8 F (36.6 C) (Oral)   Resp (!) 8   Wt 104 kg   SpO2 95%   BMI 32.90 kg/m   Physical Exam Vitals and nursing note reviewed.  Constitutional:      General: He is not in acute  distress.    Appearance: He is well-developed. He is not ill-appearing.  HENT:     Head: Normocephalic and atraumatic.     Nose: Nose normal.     Mouth/Throat:     Mouth: Mucous membranes are moist.  Eyes:     Extraocular Movements: Extraocular movements intact.     Conjunctiva/sclera: Conjunctivae normal.     Pupils: Pupils are equal, round, and reactive to light.  Cardiovascular:     Rate and Rhythm: Normal rate and regular rhythm.     Heart sounds: No murmur heard. Pulmonary:     Effort:  Pulmonary effort is normal. No respiratory distress.     Breath sounds: Normal breath sounds.  Abdominal:     Palpations: Abdomen is soft.     Tenderness: There is no abdominal tenderness.  Musculoskeletal:     Cervical back: Neck supple.  Skin:    General: Skin is warm and dry.  Neurological:     Mental Status: He is alert.     Comments: Still with some mild left facial droop may be trace left-sided weakness but otherwise strength intact, sensation appears intact, speech appears to be clear with no obvious dysarthria, may be mild drift in the left upper extremity, no visual field deficits  Psychiatric:        Mood and Affect: Mood normal.    ED Results / Procedures / Treatments   Labs (all labs ordered are listed, but only abnormal results are displayed) Labs Reviewed  CBC - Abnormal; Notable for the following components:      Result Value   RBC 3.63 (*)    Hemoglobin 11.2 (*)    HCT 34.9 (*)    All other components within normal limits  COMPREHENSIVE METABOLIC PANEL - Abnormal; Notable for the following components:   BUN 26 (*)    Creatinine, Ser 1.53 (*)    Calcium 7.8 (*)    Total Protein 5.4 (*)    Albumin 2.7 (*)    AST 14 (*)    GFR, Estimated 43 (*)    All other components within normal limits  I-STAT CHEM 8, ED - Abnormal; Notable for the following components:   Potassium 5.7 (*)    BUN 39 (*)    Creatinine, Ser 1.30 (*)    Glucose, Bld 63 (*)    Calcium, Ion 0.97  (*)    Hemoglobin 10.9 (*)    HCT 32.0 (*)    All other components within normal limits  DIFFERENTIAL  APTT  PROTIME-INR  CBG MONITORING, ED    EKG EKG Interpretation  Date/Time:  Wednesday March 24 2021 13:06:58 EDT Ventricular Rate:  64 PR Interval:  209 QRS Duration: 105 QT Interval:  490 QTC Calculation: 506 R Axis:   75 Text Interpretation: Sinus rhythm Borderline low voltage, extremity leads Prolonged QT interval Confirmed by Lennice Sites ([id number]) on 03/24/2021 1:57:17 PM  Radiology CT HEAD CODE STROKE WO CONTRAST  Result Date: 03/24/2021 CLINICAL DATA:  Code stroke.  Left facial droop EXAM: CT HEAD WITHOUT CONTRAST TECHNIQUE: Contiguous axial images were obtained from the base of the skull through the vertex without intravenous contrast. COMPARISON:  None. FINDINGS: Brain: No acute intracranial hemorrhage, mass effect, or edema. Chronic right frontal and bilateral parietal infarcts. No definite acute appearing loss of gray-white differentiation. Additional areas of low-attenuation in the supratentorial white matter are nonspecific but may reflect chronic microvascular ischemic changes. Prominence of the ventricles and sulci reflects generalized parenchymal volume loss. Vascular: No hyperdense vessel. Skull: Unremarkable. Sinuses/Orbits: Paranasal sinus mucosal thickening. No significant orbital abnormality. Other: Prior left mastoidectomy with nonspecific density within the bowl. ASPECTS [facility] Stroke Program Early CT Score) - Ganglionic level infarction (caudate, lentiform nuclei, internal capsule, insula, M1-M3 cortex): - Supraganglionic infarction (M4-M6 cortex): Total score (0-10 with 10 being normal): IMPRESSION: There is no acute intracranial hemorrhage or evidence of acute infarction. ASPECT score is 10. Chronic infarcts, chronic microvascular ischemic changes, and generalized parenchymal volume loss. These results were communicated to Dr. Quinn Axe at 12:30 pm on 03/24/2021 by  text page via the [facility] messaging system. Electronically  Signed   By: Macy Mis M.D.   On: 03/24/2021 12:35   CT ANGIO HEAD NECK W WO CM (CODE STROKE)  Result Date: 03/24/2021 CLINICAL DATA:  Neuro deficit, acute, stroke suspected EXAM: CT ANGIOGRAPHY HEAD AND NECK TECHNIQUE: Multidetector CT imaging of the head and neck was performed using the standard protocol during bolus administration of intravenous contrast. Multiplanar CT image reconstructions and MIPs were obtained to evaluate the vascular anatomy. Carotid stenosis measurements (when applicable) are obtained utilizing NASCET criteria, using the distal internal carotid diameter as the denominator. CONTRAST:  52m OMNIPAQUE IOHEXOL 350 MG/ML SOLN COMPARISON:  None. FINDINGS: CTA NECK Aortic arch: Calcified and irregular noncalcified plaque along the arch. Great vessel origins are patent. Plaque is present along the proximal left greater than right subclavian arteries without high-grade stenosis. Right carotid system: Atherosclerotic wall thickening along the common carotid. There is occlusion of the internal carotid proximally with a thin string of contrast extending through the midportion. Partial reconstitution is present distally. Left carotid system: Patent. Atherosclerotic wall thickening along the common carotid. Mixed but primarily noncalcified plaque along the proximal internal carotid causing approximately 75% stenosis. Vertebral arteries: Patent. Left vertebral artery is dominant. There is plaque at the right vertebral origin with suspected high-grade stenosis. Skeleton: Degenerative changes of the included spine. There is moderate to marked canal stenosis. Other neck: Unremarkable. Upper chest: No apical lung mass. Review of the MIP images confirms the above findings CTA HEAD Anterior circulation: Partially opacified proximal intracranial right internal carotid artery. There is greater reconstitution at the level of the cavernous segment.  Calcified plaque is present along the paraclinoid and supraclinoid portions resulting in superimposed mild to moderate stenosis. Intracranial left internal carotid artery is patent with calcified plaque but no significant stenosis. Anterior and middle cerebral arteries are patent. Moderate to marked stenosis proximal right A1 ACA. Focal calcification is present along a right M2 MCA branch. Posterior circulation: Intracranial vertebral arteries are patent. Basilar artery is posterior cerebral arteries are patent. Patent. Major cerebellar artery origins are patent. Venous sinuses: Patent as allowed by contrast bolus timing. Review of the MIP images confirms the above findings IMPRESSION: Occlusion of the cervical right internal carotid artery at its origin with thin string of contrast through the midportion and partial reconstitution distally. Intracranially, there is diminished flow with greater reconstitution at the level of the cavernous segment. Mixed but primarily noncalcified plaque along the proximal left ICA causes 75% stenosis. Suspected high-grade stenosis at the right vertebral origin. Moderate to marked focal stenosis proximal right A1 ACA. Electronically Signed   By: PMacy MisM.D.   On: 03/24/2021 13:24    Procedures .Critical Care  Date/Time: 03/24/2021 3:09 PM Performed by: CLennice Sites DO Authorized by: CLennice Sites DO   Critical care provider statement:    Critical care time (minutes):  35   Critical care was necessary to treat or prevent imminent or life-threatening deterioration of the following conditions:  CNS failure or compromise   Critical care was time spent personally by me on the following activities:  Blood draw for specimens, development of treatment plan with patient or surrogate, discussions with consultants, discussions with primary provider, evaluation of patient's response to treatment, examination of patient, obtaining history from patient or surrogate,  ordering and performing treatments and interventions, ordering and review of laboratory studies, pulse oximetry, re-evaluation of patient's condition, ordering and review of radiographic studies and review of old charts   Care discussed with: admitting provider  Medications Ordered in ED Medications  sodium chloride flush (NS) 0.9 % injection 3 mL (3 mLs Intravenous Given 03/24/21 1325)  iohexol (OMNIPAQUE) 350 MG/ML injection 75 mL (75 mLs Intravenous Contrast Given 03/24/21 1302)    ED Course  I have reviewed the triage vital signs and the nursing notes.  Pertinent labs & imaging results that were available during my care of the patient were reviewed by me and considered in my medical decision making (see chart for details).    MDM Rules/Calculators/A&P                           Tyler [name] is here as a code stroke with left-sided weakness, slurred speech, facial droop on the left.  History of high cholesterol, AAA, stroke.  Normal vitals upon arrival.  No fever.  Symptoms have significantly improved since EMS first picked up the patient.  Still has some residual left-sided facial droop and may be weakness and left upper arm drift.  May be some mildly slurred speech.  CT head shows no head bleed.  CTA shows occlusion of the cervical right internal carotid artery but there is reconstitution distally.  Some other stenosis in left ICA as well as right vertebral origin and right A1 ACA.  Given quick resolution and improvement of his symptoms patient not offered tPA.  Not a candidate for thrombectomy.  Patient has been observed in the ED with improvement of his symptoms.  He seems to be at his baseline at this time.  Will need frequent neurochecks until around 4 PM which will take him outside of his tPA window.  Will admit to medicine at that time.  Lab work is otherwise unremarkable.  To be admitted for further stroke work-up.  This chart was dictated using voice recognition software.  Despite  best efforts to proofread,  errors can occur which can change the documentation meaning.   Final Clinical Impression(s) / ED Diagnoses Final diagnoses:  TIA (transient ischemic attack)  Stenosis of right carotid artery    Rx / DC Orders ED Discharge Orders     None        Lennice Sites, DO 03/24/21 1518

## 2021-03-24 NOTE — H&P (Addendum)
Date: 03/24/2021               Patient Name:  Tyler [name] MRN: [id number]  DOB: Nov 08, 1928 Age / Sex: 87 y.o., male   PCP: [name], MD              Medical Service: Internal Medicine Teaching Service              Attending Physician: Dr. Evette Doffing, Mallie Mussel, *    First Contact: Heide Spark, MS IV Pager: [phone]  Second Contact: Dr. Collene Gobble Pager: [phone]            After Hours (After 5p/  First Contact Pager: [phone]  weekends / holidays): Second Contact Pager: [phone]   Chief Complaint: LEFT sided facial droop and LEFT sided weakness  History of Present Illness:  Patient is a pleasant 87yo [place] gentleman with pertinent PMH significant for prior CVA 2012, HLD, HTN, CAD s/p CABGx4 in 2014, AAA s/p stent graft 2015, presenting to ED via EMS d/t LEFT sided weakness and slurred speech. He reports brief dizziness lasting <101mn during episode, no syncope. Patient and wife report she noticed his left side of his face drooping down around 1125 today and called neighbor who is a nMarine scientistfor help then called EMS. Noted his symptoms improved from minimal movement of LEFT lower extremity to almost returned to baseline by EMS arrival. Once at ED minimal residual LEFT UE weakness remained, noted to have mild LEFT pronator drift in ED, mild left facial droop and dysarthria. TPA not given due to rapid resolution of symptoms, not a candidate for thrombectomy.   He denies headache, CP, SOB, nausea, vomiting, recent illness or sick symptoms.  Meds:  Current Outpatient Medications  Medication Instructions   aspirin EC 81 mg, Oral, Daily, Swallow whole.   atorvastatin (LIPITOR) 10 mg, Oral, Daily at bedtime   furosemide (LASIX) 20 mg, Oral, As directed, Take one tablet daily for one week and then only as needed.   lisinopril (ZESTRIL) 5 mg, Oral, Daily   metoprolol tartrate (LOPRESSOR) 12.5 mg, Oral, 2 times daily   nitroGLYCERIN (NITROSTAT) 0.4 mg, Sublingual, Every 5 min PRN    terazosin (HYTRIN) 5 mg, Oral, Daily at bedtime     Allergies: Allergies as of 03/24/2021 - Review Complete 03/24/2021  Allergen Reaction Noted   Atorvastatin Other (See Comments) 09/27/2006   Past Medical History:  Diagnosis Date   AAA (abdominal aortic aneurysm) ([place]    a. 05/2014 s/p stent grafting ([facility].   Bronchitis    CAD (coronary artery disease)    a. 06/2013 VF Arrest/Cath: 3VD, EF 50%;  b. 06/2013 CABG x 4: LIMA->LAD, VG->PDA->RPL, VG->OM.   CVA (cerebral infarction)    Erectile dysfunction    Essential hypertension    GERD (gastroesophageal reflux disease)    H/O hiatal hernia    HOH (hard of hearing)    wears hearing aid in right ear   Hyperlipidemia    Osteoarthritis    Pneumonia    YRS AGO   Stroke ([facility]     Family History:  Family History  Problem Relation Age of Onset   Heart disease Sister    Heart disease Brother    Heart attack Sister    Heart attack Brother    Colon cancer Neg Hx    Hypertension Neg Hx    Stroke Neg Hx      Social History:  Reports that he smoked from [age]yo but "never inhaled". Reports alcohol use  but has been minimal if any in the last 10 years.  Prior occupation as Land. Moved to [place] from [place] around age 75.     Review of Systems: A complete ROS was negative except as per HPI.   Physical Exam: Blood pressure 137/81, pulse (!) 58, temperature 97.8 F (36.6 C), temperature source Oral, resp. rate (!) 8, weight 104 kg, SpO2 95 %. Physical Exam Vitals reviewed. Exam conducted with a chaperone present.  Constitutional:      General: He is awake. He is not in acute distress.    Appearance: Normal appearance.  HENT:     Head: Normocephalic and atraumatic.     Comments: No asymmetry noted on exam; previously noted facial droop resolved Eyes:     General: Lids are normal. Gaze aligned appropriately.     Extraocular Movements: Extraocular movements intact.   Neck:     Vascular: No JVD.   Cardiovascular:     Rate and Rhythm: Normal rate and regular rhythm.     Pulses:          Dorsalis pedis pulses are 1+ on the right side and 1+ on the left side.     Heart sounds: Normal heart sounds.    No gallop.  Pulmonary:     Effort: Pulmonary effort is normal. No respiratory distress.     Breath sounds: Normal breath sounds. No decreased air movement.  Abdominal:     General: Bowel sounds are normal.     Tenderness: There is no abdominal tenderness. There is no guarding.  Musculoskeletal:     Right lower leg: No edema.     Left lower leg: No edema.  Skin:    General: Skin is warm and dry.     Findings: Lesion present.     Comments: Lesions and dry scales on B/L lower extremities  Neurological:     General: No focal deficit present.     Mental Status: He is alert and oriented to person, place, and time.     Cranial Nerves: No cranial nerve deficit, dysarthria or facial asymmetry.     Sensory: Sensation is intact.     Motor: Motor function is intact. No pronator drift.     Comments: No asymmetry on strength testing  Psychiatric:        Attention and Perception: Attention normal.        Mood and Affect: Mood and affect normal.        Speech: Speech normal.        Behavior: Behavior is cooperative.     Imaging: personally reviewed  CT Head w/o contrast 03/24/21: no acute intracranial hemorrhage, mass effect, or edema. Chronic right frontal and B/L parietal infarcts, chronic microvascular ischemic changes, and generalized parenchymal loss seen.  CTA Head Neck w/wo contrast 03/24/21: Occlusion of the cervical right internal carotid artery at its origin with thin string of contrast through the midportion and partial reconstitution distally. Intracranially, there is diminished flow with greater reconstitution at the level of the cavernous segment. Mixed but primarily noncalcified plaque along the proximal left ICA causes 75% stenosis. Suspected high-grade stenosis at the right  vertebral origin. Moderate to marked focal stenosis proximal right A1 ACA  Assessment & Plan by Problem:  Possible cerebral infarction versus TIA Rapidly resolving clinical picture in conjunction with imaging findings makes TIA likely diagnosis. Further studies pending. - Glucose 71 - Lipid panel pending - Q4H neurochecks - EKG shows no arrhythmias - Echocardiogram pending - Not  currently taking anticoagulants at home - Begin ASA 325, plavix - HOLD home metoprolol at this time - Allowing permissive HTN at this time  Hypertension Hyperlipidemia Coronary Artery Disease - Allowing permissive HTN at this time - HOLD home metoprolol at this time - Continue ASA - need to verify if taking statin or not  Dispo: Admit patient to Inpatient with expected length of stay greater than 2 midnights.  Signed: Stevan Born, Medical Student 03/24/2021, 4:03 PM  Pager: [phone]  Attestation for Student Documentation:  I personally was present and performed or re-performed the history, physical exam and medical decision-making activities of this service and have verified that the service and findings are accurately documented in the student's note.  Lelyn Okonek is 87yo person living with CAD s/p CABG, AAA s/p stenting, previous CVA, HLD, HTN who presented to ED w/ left sided weakness. This occurred ~30 minutes prior to arrival. At the time of symptom onset, patient was in his usual state of health, sitting on a chair. He denies any recent chest pain, dyspnea, syncope, recent illness, focal weakness/numbness/paresthesias. Denies previous CVA, although per chart review previous CVA in 2012. He does report remote tobacco history >50 years ago. Upon arrival to ED, symptoms had improved, almost back to baseline.   On exam, patient was afebrile, hemodynamically stable. Regular rate, rhythm without murmurs. Lungs clear to auscultation bilaterally. Neurologically, mild left facial droop, otherwise  no cranial nerve deficits. Strength and sensation normal bilateral upper and lower extremities.   CTA revealed occlusion of cervical right internal carotid artery and partial reconstitution distally. Moderate-marked focal stenosis proximal right A1 ACA.  Overall, clinical picture concerning for TIA given resolving neurological symptoms. We will initiate DAPT, obtain Echo, and allow for permissive hypertension. Appreciate neurology recommendations. Pending lipid panel, A1c for further risk management.  Sanjuan Dame, MD 03/24/2021, 6:37 PM [phone]

## 2021-03-24 NOTE — ED Notes (Signed)
Attempted X 2 to call report.

## 2021-03-25 ENCOUNTER — Observation Stay (HOSPITAL_COMMUNITY): Payer: Medicare Other

## 2021-03-25 DIAGNOSIS — E785 Hyperlipidemia, unspecified: Secondary | ICD-10-CM | POA: Diagnosis present

## 2021-03-25 DIAGNOSIS — K219 Gastro-esophageal reflux disease without esophagitis: Secondary | ICD-10-CM | POA: Diagnosis present

## 2021-03-25 DIAGNOSIS — I6523 Occlusion and stenosis of bilateral carotid arteries: Secondary | ICD-10-CM | POA: Diagnosis not present

## 2021-03-25 DIAGNOSIS — Z96642 Presence of left artificial hip joint: Secondary | ICD-10-CM | POA: Diagnosis present

## 2021-03-25 DIAGNOSIS — H9190 Unspecified hearing loss, unspecified ear: Secondary | ICD-10-CM | POA: Diagnosis present

## 2021-03-25 DIAGNOSIS — G459 Transient cerebral ischemic attack, unspecified: Secondary | ICD-10-CM | POA: Diagnosis present

## 2021-03-25 DIAGNOSIS — Z6832 Body mass index (BMI) 32.0-32.9, adult: Secondary | ICD-10-CM | POA: Diagnosis not present

## 2021-03-25 DIAGNOSIS — Z974 Presence of external hearing-aid: Secondary | ICD-10-CM | POA: Diagnosis not present

## 2021-03-25 DIAGNOSIS — R29810 Facial weakness: Secondary | ICD-10-CM | POA: Diagnosis present

## 2021-03-25 DIAGNOSIS — I10 Essential (primary) hypertension: Secondary | ICD-10-CM | POA: Diagnosis present

## 2021-03-25 DIAGNOSIS — U071 COVID-19: Secondary | ICD-10-CM | POA: Diagnosis present

## 2021-03-25 DIAGNOSIS — I714 Abdominal aortic aneurysm, without rupture: Secondary | ICD-10-CM | POA: Diagnosis present

## 2021-03-25 DIAGNOSIS — I2510 Atherosclerotic heart disease of native coronary artery without angina pectoris: Secondary | ICD-10-CM | POA: Diagnosis present

## 2021-03-25 DIAGNOSIS — R29705 NIHSS score 5: Secondary | ICD-10-CM | POA: Diagnosis present

## 2021-03-25 DIAGNOSIS — R5381 Other malaise: Secondary | ICD-10-CM | POA: Diagnosis present

## 2021-03-25 DIAGNOSIS — Z7982 Long term (current) use of aspirin: Secondary | ICD-10-CM | POA: Diagnosis not present

## 2021-03-25 DIAGNOSIS — E669 Obesity, unspecified: Secondary | ICD-10-CM | POA: Diagnosis present

## 2021-03-25 DIAGNOSIS — N529 Male erectile dysfunction, unspecified: Secondary | ICD-10-CM | POA: Diagnosis present

## 2021-03-25 DIAGNOSIS — Z8249 Family history of ischemic heart disease and other diseases of the circulatory system: Secondary | ICD-10-CM | POA: Diagnosis not present

## 2021-03-25 DIAGNOSIS — Z951 Presence of aortocoronary bypass graft: Secondary | ICD-10-CM | POA: Diagnosis not present

## 2021-03-25 DIAGNOSIS — I69354 Hemiplegia and hemiparesis following cerebral infarction affecting left non-dominant side: Secondary | ICD-10-CM | POA: Diagnosis not present

## 2021-03-25 DIAGNOSIS — Z79899 Other long term (current) drug therapy: Secondary | ICD-10-CM | POA: Diagnosis not present

## 2021-03-25 LAB — LIPID PANEL
Cholesterol: 126 mg/dL (ref 0–200)
HDL: 26 mg/dL — ABNORMAL LOW (ref >40)
LDL Cholesterol: 75 mg/dL (ref 0–99)
Total CHOL/HDL Ratio: 4.8 RATIO
Triglycerides: 127 mg/dL (ref <150)
VLDL: 25 mg/dL (ref 0–40)

## 2021-03-25 LAB — ECHOCARDIOGRAM COMPLETE
AR max vel: 2.39 cm2
AV Area VTI: 2.37 cm2
AV Area mean vel: 2.13 cm2
AV Mean grad: 7 mmHg
AV Peak grad: 13.1 mmHg
Ao pk vel: 1.81 m/s
Area-P 1/2: 2.66 cm2
Height: 70 in
S' Lateral: 3.2 cm
Weight: 3668.45 oz

## 2021-03-25 LAB — CBC
HCT: 36.2 % — ABNORMAL LOW (ref 39.0–52.0)
Hemoglobin: 12.3 g/dL — ABNORMAL LOW (ref 13.0–17.0)
MCH: 31.5 pg (ref 26.0–34.0)
MCHC: 34 g/dL (ref 30.0–36.0)
MCV: 92.8 fL (ref 80.0–100.0)
Platelets: 142 10*3/uL — ABNORMAL LOW (ref 150–400)
RBC: 3.9 MIL/uL — ABNORMAL LOW (ref 4.22–5.81)
RDW: 13.7 % (ref 11.5–15.5)
WBC: 7.6 10*3/uL (ref 4.0–10.5)
nRBC: 0 % (ref 0.0–0.2)

## 2021-03-25 LAB — BASIC METABOLIC PANEL
Anion gap: 7 (ref 5–15)
BUN: 23 mg/dL (ref 8–23)
CO2: 25 mmol/L (ref 22–32)
Calcium: 8.4 mg/dL — ABNORMAL LOW (ref 8.9–10.3)
Chloride: 107 mmol/L (ref 98–111)
Creatinine, Ser: 1.52 mg/dL — ABNORMAL HIGH (ref 0.61–1.24)
GFR, Estimated: 44 mL/min — ABNORMAL LOW (ref >60)
Glucose, Bld: 139 mg/dL — ABNORMAL HIGH (ref 70–99)
Potassium: 3.8 mmol/L (ref 3.5–5.1)
Sodium: 139 mmol/L (ref 135–145)

## 2021-03-25 LAB — SARS CORONAVIRUS 2 (TAT 6-24 HRS): SARS Coronavirus 2: POSITIVE — AB

## 2021-03-25 LAB — HEMOGLOBIN A1C
Hgb A1c MFr Bld: 5.9 % — ABNORMAL HIGH (ref 4.8–5.6)
Mean Plasma Glucose: 122.63 mg/dL

## 2021-03-25 MED ORDER — DEXTROMETHORPHAN POLISTIREX ER 30 MG/5ML PO SUER
15.0000 mg | Freq: Two times a day (BID) | ORAL | Status: DC
  Administered 2021-03-25 – 2021-03-30 (×11): 15 mg via ORAL
  Filled 2021-03-25 (×12): qty 5

## 2021-03-25 MED ORDER — ROSUVASTATIN CALCIUM 20 MG PO TABS
20.0000 mg | ORAL_TABLET | Freq: Every day | ORAL | Status: DC
  Administered 2021-03-25 – 2021-03-30 (×6): 20 mg via ORAL
  Filled 2021-03-25 (×6): qty 1

## 2021-03-25 MED ORDER — ATORVASTATIN CALCIUM 40 MG PO TABS
40.0000 mg | ORAL_TABLET | Freq: Every day | ORAL | Status: DC
  Administered 2021-03-25: 40 mg via ORAL
  Filled 2021-03-25: qty 1

## 2021-03-25 MED ORDER — ASPIRIN 81 MG PO CHEW
81.0000 mg | CHEWABLE_TABLET | Freq: Every day | ORAL | Status: DC
  Administered 2021-03-25 – 2021-03-30 (×6): 81 mg via ORAL
  Filled 2021-03-25 (×6): qty 1

## 2021-03-25 MED ORDER — PERFLUTREN LIPID MICROSPHERE
1.0000 mL | INTRAVENOUS | Status: AC | PRN
  Administered 2021-03-25: 2 mL via INTRAVENOUS
  Filled 2021-03-25: qty 10

## 2021-03-25 NOTE — Progress Notes (Addendum)
   Subjective: Patient interviewed at bedside this morning. He complains of dry cough which he states now that he has had for a few days. He also reports feeling very weak. He denies HA, vision/aud changes, chest pain, SOB, nausea. He tested positive for COVID PCR during this admission, per a prior nursing note the wife reports he tested positive for covid 8/25. Now on supplemental O2.   Objective:  Vital signs in last 24 hours: Vitals:   03/24/21 2358 03/25/21 0340 03/25/21 0809 03/25/21 1227  BP: 121/67 109/60 136/65 99/60  Pulse: 77 65 74 76  Resp: '17 19 20 18  '$ Temp: (!) 97.3 F (36.3 C) 98.2 F (36.8 C) 98 F (36.7 C) 97.8 F (36.6 C)  TempSrc: Oral  Oral Oral  SpO2: 96% 97% 94%   Weight:      Height:        Intake/Output Summary (Last 24 hours) at 03/25/2021 1346 Last data filed at 03/25/2021 0340 Gross per 24 hour  Intake --  Output 550 ml  Net -550 ml   Physical Exam Vitals and nursing note reviewed.  Constitutional:      General: He is not in acute distress.    Comments: Oriented to self, somewhat to situation but cannot recall the full details.  Eyes:     Extraocular Movements: Extraocular movements intact.  Pulmonary:     Effort: No respiratory distress.     Breath sounds: No stridor. No wheezing, rhonchi or rales.  Neurological:     General: No focal deficit present.     Motor: Weakness (overall weakness likely 2/2 deconditioning) present.  Psychiatric:        Attention and Perception: Attention normal.        Mood and Affect: Mood normal.        Speech: Speech normal.        Cognition and Memory: Memory is impaired.      Assessment/Plan:  Principal Problem:   Transient ischemic attack Active Problems:   Essential hypertension   CAD (coronary artery disease)   COVID-19   Physical deconditioning  TIA Hypertension Hyperlipidemia Coronary Artery Disease History, s/p CABG x4 Patient is back to baseline with no FND and facial droop resolved. CTA  head/neck shows right internal carotid occlusion, which could be a contributing factor. His cardiac and CVA history are also of concern, and medical management has been started with DAPT consisting of ASA and Plavix, as well as high intensity rosuvastatin. At time of admission, neuro was not recommending endarterectomy. Will reassess to see if prevention of future strokes could be improved with surgery. Lipid panel shows LDL 75. He is on Atorvastatin '10mg'$  at home, for which we are changing to Rosuvastatin '20mg'$  daily. Loading doses of ASA and Plavix given yesterday, now switched to maintenance dosing noted below. - Continue ASA '81mg'$  daily, Plavix '75mg'$  - MR brain pending - Cardiac TTE pending - Continue to hold home metoprolol at this time due to soft BP - Evaluated by PT/OT. Recommending d/c to SNF for rehab to improve functionality as he is at risk for falls and further deconditioning from baseline.  Covid (+) Nursing note reports wife states patient tested +Cov on 8/25. He tested positive here on PCR testing 03/25/21. He has a dry cough, minimal SOB, and remains afebrile. - On supplemental O2 as needed, monitor requirements - Dextromethorphan (DELSYM) '15mg'$  PO BID for cough   LOS: 0 days   Bernadette Gores, Jon Gills, Medical Student 03/25/2021, 1:46 PM

## 2021-03-25 NOTE — NC FL2 (Signed)
[place] LEVEL OF CARE SCREENING TOOL     IDENTIFICATION  Patient Name: Tyler [name] Birthdate: Dec 06, 1928 Sex: male Admission Date (Current Location): 03/24/2021  [facility] and [place] Number:  Herbalist and Address:  [facility], [place] [address], [place], [place] [address]      Provider Number: [id number]  Attending Physician Name and Address:  Axel Filler, *  Relative Name and Phone Number:       Current Level of Care: Hospital Recommended Level of Care: [place] Prior Approval Number:    Date Approved/Denied:   PASRR Number: [id number]  Discharge Plan: SNF    Current Diagnoses: Patient Active Problem List   Diagnosis Date Noted   COVID-19 03/25/2021   Physical deconditioning 03/25/2021   Transient ischemic attack 03/24/2021   Stenosis of right carotid artery    Abdominal distension 09/03/2014   Abdominal pain 09/03/2014   Arthritis of left hip 09/01/2014   AAA (abdominal aortic aneurysm) ([place])    Essential hypertension    CAD (coronary artery disease)    Cellulitis 07/08/2013   S/P CABG x 4 06/27/2013   Hyperlipidemia     Orientation RESPIRATION BLADDER Height & Weight     Self, Time, Situation, Place  Normal Continent Weight: 104 kg Height:  '5\' 10"'$  (177.8 cm)  BEHAVIORAL SYMPTOMS/MOOD NEUROLOGICAL BOWEL NUTRITION STATUS      Continent Diet (heart healthy with thin liquids)  AMBULATORY STATUS COMMUNICATION OF NEEDS Skin   Limited Assist Verbally Normal                       Personal Care Assistance Level of Assistance  Bathing, Feeding, Dressing Bathing Assistance: Maximum assistance Feeding assistance: Limited assistance Dressing Assistance: Maximum assistance     Functional Limitations Info  Sight, Hearing, Speech Sight Info: Impaired Hearing Info: Impaired Speech Info: Adequate    SPECIAL CARE FACTORS FREQUENCY  PT (By licensed PT), OT (By licensed OT)      PT Frequency: 5x/wk OT Frequency: 5x/wk            Contractures Contractures Info: Not present    Additional Factors Info  Code Status, Allergies Code Status Info: Full Allergies Info: atorvastatin           Current Medications (03/25/2021):  This is the current hospital active medication list Current Facility-Administered Medications  Medication Dose Route Frequency Provider Last Rate Last Admin    stroke: mapping our early stages of recovery book   Does not apply Once Sanjuan Dame, MD       acetaminophen (TYLENOL) tablet 650 mg  650 mg Oral Q4H PRN Sanjuan Dame, MD       Or   acetaminophen (TYLENOL) 160 MG/5ML solution 650 mg  650 mg Per Tube Q4H PRN Sanjuan Dame, MD       Or   acetaminophen (TYLENOL) suppository 650 mg  650 mg Rectal Q4H PRN Sanjuan Dame, MD       aspirin chewable tablet 81 mg  81 mg Oral Daily Sanjuan Dame, MD   81 mg at 03/25/21 1242   clopidogrel (PLAVIX) tablet 75 mg  75 mg Oral Daily Sanjuan Dame, MD   75 mg at 03/25/21 1242   dextromethorphan (DELSYM) 30 MG/5ML liquid 15 mg  15 mg Oral BID Sanjuan Dame, MD       enoxaparin (LOVENOX) injection 40 mg  40 mg Subcutaneous Q24H Sanjuan Dame, MD   40 mg  at 03/24/21 1654   rosuvastatin (CRESTOR) tablet 20 mg  20 mg Oral Daily Sanjuan Dame, MD       senna-docusate (Senokot-S) tablet 1 tablet  1 tablet Oral QHS PRN Sanjuan Dame, MD         Discharge Medications: Please see discharge summary for a list of discharge medications.  Relevant Imaging Results:  Relevant Lab Results:   Additional Information SS#: [id number]  Pollie Friar, RN

## 2021-03-25 NOTE — Plan of Care (Signed)

## 2021-03-25 NOTE — Evaluation (Signed)
Physical Therapy Evaluation Patient Details Name: Marquece Thoreson MRN: [id number] DOB: 02/09/1929 Today's Date: 03/25/2021   History of Present Illness  87 y.o. male who presented to the ED for evaluation of left-sided weakness and slurred speech on 03/24/21.[place] imaging is without acute intracranial abnormalities but reveals chronic infarcts. CTA reveals occlusion of cervical R ICA.  Stroke work up underway.  Medical history significant for remote stroke without deficits, hyperlipidemia, essential hypertension, CAD s/p CABG x 4 in 2014, AAA s/p stent grafting in 2015, osteoarthritis, and GERD.  Clinical Impression  Pt admitted with above diagnosis. At baseline, pt lives with wife and ambulates in the home with RW.  He has DME at home.  Today, pt requiring increased assistance for transfers (min-mod A) with decreased balance and generalized weakness.  He required assist for balance to prevent falls during evaluation.  Pt is below his baseline - recommending SNF.  Pt currently with functional limitations due to the deficits listed below (see PT Problem List). Pt will benefit from skilled PT to increase their independence and safety with mobility to allow discharge to the venue listed below.       Follow Up Recommendations SNF    Equipment Recommendations  None recommended by PT    Recommendations for Other Services       Precautions / Restrictions Precautions Precautions: Fall      Mobility  Bed Mobility Overal bed mobility: Needs Assistance Bed Mobility: Supine to Sit     Supine to sit: Mod assist     General bed mobility comments: In chair at arrival; was mod A with OT    Transfers Overall transfer level: Needs assistance Equipment used: Rolling walker (2 wheeled) Transfers: Sit to/from Stand Sit to Stand: Mod assist         General transfer comment: assist to power up; initial posteiror bias  Ambulation/Gait Ambulation/Gait assistance: Min assist Gait Distance (Feet):  40 Feet Assistive device: Rolling walker (2 wheeled) Gait Pattern/deviations: Step-to pattern;Decreased stride length;Shuffle Gait velocity: decreased   General Gait Details: Min A to steady at times; cues for RW use with turns  Financial trader Rankin (Stroke Patients Only) Modified Rankin (Stroke Patients Only) Pre-Morbid Rankin Score: Moderate disability Modified Rankin: Moderately severe disability     Balance Overall balance assessment: Needs assistance   Sitting balance-Leahy Scale: Good     Standing balance support: Bilateral upper extremity supported Standing balance-Leahy Scale: Poor Standing balance comment: requiring UE support at RW                             Pertinent Vitals/Pain Pain Assessment: No/denies pain    Home Living Family/patient expects to be discharged to:: Private residence Living Arrangements: Spouse/significant other Available Help at Discharge: Available 24 hours/day Type of Home: House Home Access: Stairs to enter Entrance Stairs-Rails: Right Entrance Stairs-Number of Steps: 3 Home Layout: One level Home Equipment: Walker - 2 wheels;Cane - single point;Bedside commode;Shower seat;Grab bars - toilet;Grab bars - tub/shower;Wheelchair - manual      Prior Function Level of Independence: Needs assistance   Gait / Transfers Assistance Needed: Uses a lift chair but able to ambulate to the bathroom independently with RW; uses a rail in shower to pull self up  ADL's / Homemaking Assistance Needed: wife provides S with transfers; pt baths himself; wife assists with pericare and LB dressing;  wears depends        Hand Dominance   Dominant Hand: Right    Extremity/Trunk Assessment   Upper Extremity Assessment Upper Extremity Assessment: Defer to OT evaluation    Lower Extremity Assessment Lower Extremity Assessment: LLE deficits/detail;RLE deficits/detail RLE Deficits / Details: ROM  WFL; MMT 4+/5 RLE Sensation: WNL RLE Coordination: WNL LLE Deficits / Details: ROM WFL; MMT 4+/5 LLE Sensation: WNL LLE Coordination: WNL    Cervical / Trunk Assessment Cervical / Trunk Assessment: Kyphotic  Communication   Communication: HOH  Cognition Arousal/Alertness: Awake/alert Behavior During Therapy: WFL for tasks assessed/performed Overall Cognitive Status: Within Functional Limits for tasks assessed                                 General Comments: most likely at baseline      General Comments General comments (skin integrity, edema, etc.): VSS; wife present    Exercises Other Exercises Other Exercises: incentive spirometer - able to pull 724m   Assessment/Plan    PT Assessment Patient needs continued PT services  PT Problem List Decreased strength;Decreased mobility;Decreased safety awareness;Decreased activity tolerance;Decreased balance;Decreased knowledge of use of DME       PT Treatment Interventions DME instruction;Therapeutic activities;Gait training;Therapeutic exercise;Patient/family education;Balance training;Functional mobility training    PT Goals (Current goals can be found in the Care Plan section)  Acute Rehab PT Goals Patient Stated Goal: to get stronger and not fall PT Goal Formulation: With patient Time For Goal Achievement: 04/08/21 Potential to Achieve Goals: Good    Frequency Min 3X/week   Barriers to discharge        Co-evaluation               AM-PAC PT "6 Clicks" Mobility  Outcome Measure Help needed turning from your back to your side while in a flat bed without using bedrails?: A Little Help needed moving from lying on your back to sitting on the side of a flat bed without using bedrails?: A Lot Help needed moving to and from a bed to a chair (including a wheelchair)?: A Little Help needed standing up from a chair using your arms (e.g., wheelchair or bedside chair)?: A Lot Help needed to walk in hospital  room?: A Little Help needed climbing 3-5 steps with a railing? : Total 6 Click Score: 14    End of Session Equipment Utilized During Treatment: Gait belt Activity Tolerance: Patient tolerated treatment well Patient left: with chair alarm set;in chair;with call bell/phone within reach Nurse Communication: Mobility status PT Visit Diagnosis: Unsteadiness on feet (R26.81);Muscle weakness (generalized) (M62.81)    Time: [id number] Time Calculation (min) (ACUTE ONLY): 20 min   Charges:   PT Evaluation $PT Eval Low Complexity: 1 Low          Rosendo Couser, PT Acute Rehab Services Pager [phone]esRehab [place]9/07/2020, 2:00 PM

## 2021-03-25 NOTE — Progress Notes (Addendum)
Stroke Team Progress Note  SUBJECTIVE Patient is sitting up in a bedside chair.  His wife is at the bedside.  He states his symptoms have resolved completely.  Denies any prior history of strokes or known history of carotid disease. MRI scan of the brain is motion degraded but shows no acute infarct.  Remote age infarcts noted bilaterally in the frontal and parietal lobes.  CT angiogram showed occlusion of the right internal carotid artery in the neck at its origin with a string sign to the midportion with partial reconstitution distally.  75% proximal left ICA stenosis with a mixed plaque.  Suspect high-grade stenosis at the right vertebral artery origin. OBJECTIVE Most recent Vital Signs: Temp: 97.8 F (36.6 C) (09/01 1227) Temp Source: Oral (09/01 1227) BP: 99/60 (09/01 1227) Pulse Rate: 76 (09/01 1227) Respiratory Rate: 18 O2 Saturdation: 94%  CBG (last 3)  Recent Labs    03/24/21 1211  GLUCAP 71       Studies:  CT Head no acute abnormality MRI no acute infarct.  Bilateral chronic infarcts in frontal and parietal lobes CTA brain and neck high-grade proximal right ICA stenosis with string sign with partial reconstitution distally.  75% left extracranial proximal carotid stenosis.  Suspect high-grade right vertebral artery origin stenosis ECHO left ventricular ejection fraction 60 to 65%. LDL 75 mg percent HbA1c 5.9  Physical Exam:   Obese elderly Caucasian male not in distress. Extremely hard of hearing. . Afebrile. Head is nontraumatic. Neck is supple without bruit.    Cardiac exam no murmur or gallop. Lungs are clear to auscultation. Distal pulses are well felt.  Neurological Exam ;  Awake  Alert oriented x 3. Normal speech and language.eye movements full without nystagmus.fundi were not visualized. Vision acuity and fields appear normal. Hearing is significantly diminished bilaterally left more than right.. Palatal movements are normal. Face symmetric. Tongue midline. Normal  strength, tone, reflexes and coordination. Normal sensation. Gait deferred.  ASSESSMENT [name] is a 87 y.o. male with a transient left hemiparesis, secondary to symptomatic high-grade right extracranial carotid stenosis with string sign versus occlusion likely due to right hemispheric TIA.,  Multiple vascular risk factors of significant extracranial stenosis, obesity, hyperlipidemia, COVID infection    Hospital day # 0  TREATMENT/PLAN Patient has presented with right hemispheric TIA likely due to symptomatic severe high-grade right extracranial carotid stenosis.  Remains at risk for recurrent TIAs and strokes. Recommend dual antiplatelet therapy aspirin and Plavix for 3 months and aggressive risk factor modification. Patient advised to quit smoking completely.  Statin therapy. Consult vascular surgery for elective carotid revascularization.  Discussed with Dr. Marthenia Rolling out of bed therapy consults. Treatment for COVID infection as per primary team   I spent 35  minutes in total face-to-face time with the patient, more than 50% of which was spent in counseling and coordination of care, reviewing test results, reviewing medication and discussing or reviewing the diagnosis of    , the prognosis and treatment options. Stroke team will sign off.  Kindly call for questions.  Follow-up as outpatient stroke clinic in 2 months Antony Contras, MD Medical Director [facility] Pager: [phone] 03/25/2021 5:02 PM

## 2021-03-25 NOTE — Consult Note (Signed)
[facility] [place]  ASSESSMENT / PLAN: 87 y.o. male with symptomatic right carotid artery occlusion; asymptomatic left carotid artery stenosis (75%).   The patient should continue best medical therapy for carotid artery stenosis including: Complete cessation from all tobacco products. Blood glucose control with goal A1c < 7%. Blood pressure control with goal blood pressure < 140/90 mmHg. Lipid reduction therapy with goal LDL-C <100 mg/dL (<70 if symptomatic from carotid artery stenosis).  Aspirin '81mg'$  PO QD.  Clopidogrel '75mg'$  PO QD. Atorvastatin 40-'80mg'$  PO QD (or other "high intensity" statin therapy).  No role for revascularization for occluded right carotid artery. Left carotid artery needs surveillance which can be done as an outpatient. Follow up with me in 1 month with carotid duplex. Please call for questions.  CHIEF COMPLAINT: Left-sided weakness  HISTORY OF PRESENT ILLNESS: Tyler [name] is a 87 y.o. male admitted to the hospital for left-sided weakness beginning yesterday.  The patient reports he was in his typical state of health yesterday when he noticed left-sided weakness, and facial drooping.  He reports some persistent weakness in his left upper extremity and left lower extremity.  His facial droop has resolved.  He has been deconditioned and has had hard time taking care of himself at home.  He lives independently with his wife.  VASCULAR SURGICAL HISTORY: EVAR 2015.  VASCULAR RISK FACTORS: Positive history of stroke / transient ischemic attack. Positive history of coronary artery disease. +history of CABG.  Negative history of diabetes mellitus. Last A1c 5.9. Negative history of smoking. Not actively smoking. Positive history of hypertension.  Positive history of chronic kidney disease.  Last GFR 44.  Negative history of chronic obstructive pulmonary disease.  FUNCTIONAL STATUS: ECOG performance status: (2) Ambulatory and capable of self  care, unable to carry out work activity, up and about > 50% or waking hours Ambulatory status: Minimally ambulatory (e.g. about the home only)  Past Medical History:  Diagnosis Date   AAA (abdominal aortic aneurysm) ([place])    a. 05/2014 s/p stent grafting [facility]).   Bronchitis    CAD (coronary artery disease)    a. 06/2013 VF Arrest/Cath: 3VD, EF 50%;  b. 06/2013 CABG x 4: LIMA->LAD, VG->PDA->RPL, VG->OM.   CVA (cerebral infarction)    Erectile dysfunction    Essential hypertension    GERD (gastroesophageal reflux disease)    H/O hiatal hernia    HOH (hard of hearing)    wears hearing aid in right ear   Hyperlipidemia    Osteoarthritis    Pneumonia    YRS AGO   Stroke [facility])     Past Surgical History:  Procedure Laterality Date   CORONARY ARTERY BYPASS GRAFT N/A 06/27/2013   Procedure: CORONARY ARTERY BYPASS GRAFTING (CABG);  Surgeon: Ivin Poot, MD;  Location: [place];  Service: Open Heart Surgery;  Laterality: N/A;  times four utilizing the left internal mammary artery and the right greater saphenous vein harvested endoscopically   ENDOVEIN HARVEST OF GREATER SAPHENOUS VEIN Right 06/27/2013   Procedure: ENDOVEIN HARVEST OF GREATER SAPHENOUS VEIN;  Surgeon: Ivin Poot, MD;  Location: [place];  Service: Open Heart Surgery;  Laterality: Right;   EYE SURGERY     bilateral cataracts   INNER EAR SURGERY Left 1982   INNER EAR SURGERY     severed a nerve in his left ear.    LEFT HEART CATHETERIZATION WITH CORONARY ANGIOGRAM N/A 06/27/2013   Procedure: LEFT HEART CATHETERIZATION WITH CORONARY ANGIOGRAM;  Surgeon: Mallie Mussel  Carlye Grippe, MD;  Location[facility] C[facility] LAB;  Service: Cardiovascular;  Laterality: N/A;  iabp insertion, cpr, intubation, emergent cvts   TEE WITHOUT CARDIOVERSION N/A 06/27/2013   Procedure: TRANSESOPHAGEAL ECHOCARDIOGRAM (TEE);  Surgeon: Ivin Poot, MD;  Location: [place];  Service: Open Heart Surgery;  Laterality: N/A;   TONSILLECTOMY     TOTAL HIP  ARTHROPLASTY Left 09/01/2014   Procedure: TOTAL HIP ARTHROPLASTY;  Surgeon: Kerin Salen, MD;  Location: [place];  Service: Orthopedics;  Laterality: Left;    Family History  Problem Relation Age of Onset   Heart disease Sister    Heart disease Brother    Heart attack Sister    Heart attack Brother    Colon cancer Neg Hx    Hypertension Neg Hx    Stroke Neg Hx     Social History   Socioeconomic History   Marital status: Married    Spouse name: Not on file   Number of children: Not on file   Years of education: Not on file   Highest education level: Not on file  Occupational History   Not on file  Tobacco Use   Smoking status: Never   Smokeless tobacco: Never  Vaping Use   Vaping Use: Never used  Substance and Sexual Activity   Alcohol use: Yes    Comment: Rare   Drug use: No   Sexual activity: Not on file  Other Topics Concern   Not on file  Social History Narrative   Not on file   Social Determinants of Health   Financial Resource Strain: Not on file  Food Insecurity: Not on file  Transportation Needs: Not on file  Physical Activity: Not on file  Stress: Not on file  Social Connections: Not on file  Intimate Partner Violence: Not on file    Allergies  Allergen Reactions   Atorvastatin Other (See Comments)    Muscle pain    Current Facility-Administered Medications  Medication Dose Route Frequency Provider Last Rate Last Admin    stroke: mapping our early stages of recovery book   Does not apply Once Sanjuan Dame, MD       acetaminophen (TYLENOL) tablet 650 mg  650 mg Oral Q4H PRN Sanjuan Dame, MD       Or   acetaminophen (TYLENOL) 160 MG/5ML solution 650 mg  650 mg Per Tube Q4H PRN Sanjuan Dame, MD       Or   acetaminophen (TYLENOL) suppository 650 mg  650 mg Rectal Q4H PRN Sanjuan Dame, MD       aspirin chewable tablet 81 mg  81 mg Oral Daily Sanjuan Dame, MD   81 mg at 03/25/21 1242   clopidogrel (PLAVIX) tablet 75 mg  75 mg  Oral Daily Sanjuan Dame, MD   75 mg at 03/25/21 1242   dextromethorphan (DELSYM) 30 MG/5ML liquid 15 mg  15 mg Oral BID Sanjuan Dame, MD       enoxaparin (LOVENOX) injection 40 mg  40 mg Subcutaneous Q24H Sanjuan Dame, MD   40 mg at 03/24/21 1654   rosuvastatin (CRESTOR) tablet 20 mg  20 mg Oral Daily Sanjuan Dame, MD       senna-docusate (Senokot-S) tablet 1 tablet  1 tablet Oral QHS PRN Sanjuan Dame, MD        REVIEW OF SYSTEMS:  '[X]'$  denotes positive finding, '[ ]'$  denotes negative finding Cardiac  Comments:  Chest pain or chest pressure:    Shortness of breath upon exertion:  Short of breath when lying flat:    Irregular heart rhythm:        Vascular    Pain in calf, thigh, or hip brought on by ambulation:    Pain in feet at night that wakes you up from your sleep:     Blood clot in your veins:    Leg swelling:         Pulmonary    Oxygen at home:    Productive cough:     Wheezing:         Neurologic    Sudden weakness in arms or legs:  x   Sudden numbness in arms or legs:     Sudden onset of difficulty speaking or slurred speech:    Temporary loss of vision in one eye:     Problems with dizziness:         Gastrointestinal    Blood in stool:     Vomited blood:         Genitourinary    Burning when urinating:     Blood in urine:        Psychiatric    Major depression:         Hematologic    Bleeding problems:    Problems with blood clotting too easily:        Skin    Rashes or ulcers:        Constitutional    Fever or chills:      PHYSICAL EXAM Vitals:   03/24/21 2358 03/25/21 0340 03/25/21 0809 03/25/21 1227  BP: 121/67 109/60 136/65 99/60  Pulse: 77 65 74 76  Resp: '17 19 20 18  '$ Temp: (!) 97.3 F (36.3 C) 98.2 F (36.8 C) 98 F (36.7 C) 97.8 F (36.6 C)  TempSrc: Oral  Oral Oral  SpO2: 96% 97% 94%   Weight:      Height:        Constitutional: Elderly, frail appearing. No distress. Appears well nourished.  Neurologic:  CN intact. 5/5 RUE strength. 5/5 LUE strength. 5/5 RLE strength. 4/5 LLE strength. Psychiatric:  Mood and affect symmetric and appropriate. Eyes:  No icterus. No conjunctival pallor. Ears, nose, throat:  mucous membranes moist. Midline trachea.  Cardiac: regular rate and rhythm.  Respiratory:  unlabored. Abdominal:  soft, non-tender, non-distended.  Extremity: no edema. no cyanosis. no pallor.  Skin: no gangrene. no ulceration.  Lymphatic: no Stemmer's sign. no palpable lymphadenopathy.  PERTINENT LABORATORY AND RADIOLOGIC DATA  Most recent CBC CBC Latest Ref Rng & Units 03/25/2021 03/24/2021 03/24/2021  WBC 4.0 - 10.5 K/uL 7.6 - 7.1  Hemoglobin 13.0 - 17.0 g/dL 12.3(L) 10.9(L) 11.2(L)  Hematocrit 39.0 - 52.0 % 36.2(L) 32.0(L) 34.9(L)  Platelets 150 - 400 K/uL 142(L) - 182     Most recent CMP CMP Latest Ref Rng & Units 03/25/2021 03/24/2021 03/24/2021  Glucose 70 - 99 mg/dL 139(H) 79 63(L)  BUN 8 - 23 mg/dL 23 26(H) 39(H)  Creatinine 0.61 - 1.24 mg/dL 1.52(H) 1.53(H) 1.30(H)  Sodium 135 - 145 mmol/L 139 137 140  Potassium 3.5 - 5.1 mmol/L 3.8 3.8 5.7(H)  Chloride 98 - 111 mmol/L 107 107 110  CO2 22 - 32 mmol/L 25 25 -  Calcium 8.9 - 10.3 mg/dL 8.4(L) 7.8(L) -  Total Protein 6.5 - 8.1 g/dL - 5.4(L) -  Total Bilirubin 0.3 - 1.2 mg/dL - 0.9 -  Alkaline Phos 38 - 126 U/L - 45 -  AST 15 - 41 U/L -  14(L) -  ALT 0 - 44 U/L - 10 -    Renal function Estimated Creatinine Clearance: 39.8 mL/min (A) (by C-G formula based on SCr of 1.52 mg/dL (H)).  Hgb A1c MFr Bld (%)  Date Value  03/25/2021 5.9 (H)    LDL Chol Calc (NIH)  Date Value Ref Range Status  06/03/2020 100 (H) 0 - 99 mg/dL Final   LDL Cholesterol  Date Value Ref Range Status  03/25/2021 75 0 - 99 mg/dL Final    Comment:           Total Cholesterol/HDL:CHD Risk Coronary Heart Disease Risk Table                     Men   Women  1/2 Average Risk   3.4   3.3  Average Risk       5.0   4.4  2 X Average Risk   9.6   7.1  3  X Average Risk  23.4   11.0        Use the calculated Patient Ratio above and the CHD Risk Table to determine the patient's CHD Risk.        ATP III CLASSIFICATION (LDL):  <100     mg/dL   Optimal  100-129  mg/dL   Near or Above                    Optimal  130-159  mg/dL   Borderline  160-189  mg/dL   High  >190     mg/dL   Very High Performed at [facility] [address], [place], [place] [address]      Vascular Imaging: CT angiogram of head and neck reviewed in detail. Right proximal ICA is occluded.  There is reconstitution of the distal, cervical internal carotid artery likely via retrograde flow from the circle of Willis.  Left internal carotid artery has significant (75% by NASCET criteria) stenosis.  Yevonne Aline. Stanford Breed, MD [facility] Phone Number: [phone] 03/25/2021 2:40 PM  Total time spent on preparing this encounter including chart review, data review, collecting history, examining the patient, coordinating care for this established patient, 40 minutes.  Portions of this report may have been transcribed using voice recognition software.  Every effort has been made to ensure accuracy; however, inadvertent computerized transcription errors may still be present.

## 2021-03-25 NOTE — TOC Initial Note (Signed)
Transition of Care [facility]) - Initial/Assessment Note    Patient Details  Name: Tyler [name] MRN: [id number] Date of Birth: November 10, 1928  Transition of Care [facility]) CM/SW Contact:    Pollie Friar, RN Phone Number: 03/25/2021, 2:26 PM  Clinical Narrative:                 Patient lives at home with his spouse. He was ambulatory at home with a walker.  Recommendations are for SNF rehab. CM met with the patients wife and she was agreeable and asked that he be faxed out in the [facility] area.  Pt is covid +. Per spouse he tested positive at urgent care on 03/18/2021.  TOC following.  Expected Discharge Plan: Skilled Nursing Facility Barriers to Discharge: Continued Medical Work up   Patient Goals and CMS Choice   CMS Medicare.gov Compare Post Acute Care list provided to:: Patient Represenative (must comment) Choice offered to / list presented to : Spouse  Expected Discharge Plan and Services Expected Discharge Plan: Skilled Nursing Facility In-house Referral: Clinical Social Work Discharge Planning Services: CM Consult Post Acute Care Choice: [place] Living arrangements for the past 2 months: [place]                                      Prior Living Arrangements/Services Living arrangements for the past 2 months: Single Family Home Lives with:: Spouse Patient language and need for interpreter reviewed:: Yes Do you feel safe going back to the place where you live?: Yes      Need for Family Participation in Patient Care: Yes (Comment) Care giver support system in place?: Yes (comment)   Criminal Activity/Legal Involvement Pertinent to Current Situation/Hospitalization: No - Comment as needed  Activities of Daily Living      Permission Sought/Granted                  Emotional Assessment Appearance:: Appears stated age     Orientation: : Oriented to Self, Oriented to Place, Oriented to  Time, Oriented to Situation   Psych  Involvement: No (comment)  Admission diagnosis:  TIA (transient ischemic attack) [G45.9] Transient ischemic attack [G45.9] Stenosis of right carotid artery [I65.21] Poor venous access [I87.8] Patient Active Problem List   Diagnosis Date Noted   COVID-19 03/25/2021   Physical deconditioning 03/25/2021   Transient ischemic attack 03/24/2021   Stenosis of right carotid artery    Abdominal distension 09/03/2014   Abdominal pain 09/03/2014   Arthritis of left hip 09/01/2014   AAA (abdominal aortic aneurysm) ([place])    Essential hypertension    CAD (coronary artery disease)    Cellulitis 07/08/2013   S/P CABG x 4 06/27/2013   Hyperlipidemia    PCP:  [name], MD Pharmacy:   [place], [place] - [address] [place] [place] [address] Phone: [phone] Fax: [place], [place] - [address] [place] [address] Phone: [phone] Fax: [phone]     Social Determinants of Health (SDOH) Interventions    Readmission Risk Interventions No flowsheet data found.

## 2021-03-25 NOTE — Progress Notes (Signed)
SLP Cancellation Note  Patient Details Name: Tyler [name] MRN: [id number] DOB: [birth year]   Cancelled treatment:       Reason Eval/Treat Not Completed: Patient at procedure or test/unavailable; Pt leaving unit for MRI at this time. Will continue efforts    [place], [place]   03/25/2021, 3:30 PM

## 2021-03-25 NOTE — Evaluation (Signed)
Occupational Therapy Evaluation Patient Details Name: Tyler [name] MRN: [id number] DOB: 09/29/1928 Today's Date: 03/25/2021    History of Present Illness 87 y.o. male who presented to the ED for evaluation of left-sided weakness and slurred speech. who presented to the ED for evaluation of left-sided weakness and slurred speech. [place] imaging is without acute intracranial abnormalities but reveals chronic infarcts. Stroke work up underway.  Medical history significant for remote stroke without deficits, hyperlipidemia, essential hypertension, CAD s/p CABG x 4 in 2014, AAA s/p stent grafting in 2015, osteoarthritis, and GERD.   Clinical Impression   PTA pt lives at home with his wife, who assists with LB ADL and pericare after toileting. At baseline, pt modified independent with mobility @ RW level and spends time by himself during the day. Pt required Mod A with mobility today and demonstrates generalized weakness. Pt states he feels "a little weaker" on the L. High fall risk. Wife and pt interested in rehab at SNF prior to return home. Feel pt would greatly benefit from reahb at [facility]. Pt fatigues easily and has a productive cough. Educated on use of incentive spirometer. Will notify MD.     Follow Up Recommendations  SNF    Equipment Recommendations  None recommended by OT    Recommendations for Other Services       Precautions / Restrictions Precautions Precautions: Fall      Mobility Bed Mobility Overal bed mobility: Needs Assistance Bed Mobility: Supine to Sit     Supine to sit: Mod assist     General bed mobility comments: uses a rial at home; Assist required with [facility] elevated    Transfers Overall transfer level: Needs assistance   Transfers: Sit to/from Stand Sit to Stand: Mod assist         General transfer comment: assist to power up; initial posteiror bias    Balance Overall balance assessment: Needs assistance   Sitting balance-Leahy Scale: Fair        Standing balance-Leahy Scale: Poor                             ADL either performed or assessed with clinical judgement   ADL Overall ADL's : Needs assistance/impaired Eating/Feeding: Set up   Grooming: Set up;Sitting   Upper Body Bathing: Set up;Sitting   Lower Body Bathing: Maximal assistance;Sit to/from stand   Upper Body Dressing : Minimal assistance;Sitting   Lower Body Dressing: Maximal assistance;Sit to/from stand   Toilet Transfer: Moderate assistance;Ambulation Toilet Transfer Details (indicate cue type and reason): unsafe; assist to prevent fall; impulsively rushing to commode Toileting- Clothing Manipulation and Hygiene: Total assistance       Functional mobility during ADLs: Moderate assistance;Rolling walker;Cueing for safety;Cueing for sequencing General ADL Comments: pulls up on RW at baseline; pulls on rails; wife states he has a "fear of falling"     Vision Baseline Vision/History: 1 Wears glasses Patient Visual Report: No change from baseline Additional Comments: appears at baseline     Perception Perception Comments: cues at times for orientation in RW; will further assess   Praxis Praxis Praxis tested?: Within functional limits    Pertinent Vitals/Pain Pain Assessment: No/denies pain     Hand Dominance Right   Extremity/Trunk Assessment Upper Extremity Assessment Upper Extremity Assessment: Generalized weakness   Lower Extremity Assessment Lower Extremity Assessment: Defer to PT evaluation   Cervical / Trunk Assessment Cervical / Trunk Assessment: Kyphotic (forward head)   Communication  Communication Communication: HOH   Cognition Arousal/Alertness: Awake/alert Behavior During Therapy: WFL for tasks assessed/performed Overall Cognitive Status: Within Functional Limits for tasks assessed; most likely baseline; VC for safety                                 General Comments:joking with therapist throughout  session  General Comments       Exercises Exercises: Other exercises Other Exercises Other Exercises: incentive spirometer - able to pull 773m   Shoulder Instructions      Home Living Family/patient expects to be discharged to:: Private residence Living Arrangements: Spouse/significant other Available Help at Discharge: Available 24 hours/day (wife does leave throughout the day; has a PCA @ 1 week/month) Type of Home: House Home Access: Stairs to enter [employer] Steps: 3 Entrance Stairs-Rails: Right Home Layout: One level     Bathroom Shower/Tub: WOccupational psychologist Standard Bathroom Accessibility: Yes How Accessible: Accessible via walker Home Equipment: [place]- 2 wheels;Cane - single point;Bedside commode;Shower seat;Grab bars - toilet;Grab bars - tub/shower;Wheelchair - manual          Prior Functioning/Environment Level of Independence: Needs assistance  Gait / Transfers Assistance Needed: Uses a lift chair but able to ambulate to the bathroom independently with RW; uses a rail in shower to pull self up ADL's / Homemaking Assistance Needed: wife provides S with transfers; pt baths himself; wife assists with pericare and LB dressing; wears depends            OT Problem List: Decreased strength;Decreased range of motion;Decreased activity tolerance;Impaired balance (sitting and/or standing);Decreased safety awareness;Decreased knowledge of use of DME or AE;Cardiopulmonary status limiting activity;Obesity      OT Treatment/Interventions: Self-care/ADL training;Therapeutic exercise;Energy conservation;DME and/or AE instruction;Therapeutic activities;Patient/family education;Balance training    OT Goals(Current goals can be found in the care plan section) Acute Rehab OT Goals Patient Stated Goal: to get stronger and not fall OT Goal Formulation: With patient/family Time For Goal Achievement: 04/08/21 Potential to Achieve Goals: Good   OT Frequency: Min 2X/week   Barriers to D/C:            Co-evaluation              AM-PAC OT "6 Clicks" Daily Activity     Outcome Measure Help from another person eating meals?: A Little Help from another person taking care of personal grooming?: A Little Help from another person toileting, which includes using toliet, bedpan, or urinal?: Total Help from another person bathing (including washing, rinsing, drying)?: A Lot Help from another person to put on and taking off regular upper body clothing?: A Little Help from another person to put on and taking off regular lower body clothing?: A Lot 6 Click Score: 14   End of Session Equipment Utilized During Treatment: Gait belt;Rolling walker Nurse Communication: Mobility status  Activity Tolerance: Patient tolerated treatment well Patient left: in chair;with call bell/phone within reach;with chair alarm set;with family/visitor present  OT Visit Diagnosis: Unsteadiness on feet (R26.81);Other abnormalities of gait and mobility (R26.89);Muscle weakness (generalized) (M62.81)                Time: 1049-1130 OT Time Calculation (min): 41 min Charges:  OT General Charges $OT Visit: 1 Visit OT Evaluation $OT Eval Moderate Complexity: 1 Mod OT Treatments $Self Care/Home Management : 23-37 mins  HMaurie Boettcher OT/L   Acute OT Clinical Specialist Acute Rehabilitation Services  Pager [phone] Office [phone]   Jesilyn Easom,HILLARY 03/25/2021, 1:26 PM

## 2021-03-26 DIAGNOSIS — G459 Transient cerebral ischemic attack, unspecified: Secondary | ICD-10-CM | POA: Diagnosis not present

## 2021-03-26 NOTE — TOC Progression Note (Signed)
Transition of Care [facility]) - Progression Note    Patient Details  Name: Joon Callison MRN: [id number] Date of Birth: [birth year]  [facility] CM/SW Contact  Pollie Friar, RN Phone Number: 03/26/2021, 11:33 AM  Clinical Narrative:    CM has provided the patients wife with bed offers.  TOC following.   Expected Discharge Plan: [place] Barriers to Discharge: Continued Medical Work up  Expected Discharge Plan and Services Expected Discharge Plan: [place] In-house Referral: Clinical Social Work Discharge Planning Services: CM Consult Post Acute Care Choice: [place] arrangements for the past 2 months: Single Family Home                                       Social Determinants of Health (SDOH) Interventions    Readmission Risk Interventions No flowsheet data found.

## 2021-03-26 NOTE — Progress Notes (Signed)
SLP Cancellation Note  Patient Details Name: Jovel Buckalew MRN: [id number] DOB: 10/10/1928   Cancelled treatment:       Reason Eval/Treat Not Completed: SLP screened, no needs identified, will sign off Given no acute infarct and resolution of symptoms.    Jeryl Umholtz, Katherene Ponto 03/26/2021, 7:24 AM

## 2021-03-26 NOTE — Progress Notes (Addendum)
Subjective: Interviewed at bedside. Patient is pleasant, alert and awake, has trouble remembering why he is in the hospital but memory is triggered with prompting and he can then give more information. He has no complaints this morning, and reports his cough is improving with the dextromethorphan. He denies chest pain, SOB, dyspnea, nausea, headache.   Objective:  Vital signs in last 24 hours: Vitals:   03/25/21 1227 03/25/21 1708 03/25/21 2000 03/26/21 0920  BP: 99/60 (!) 161/74 122/63 (!) 145/73  Pulse: 76 67 75 76  Resp: '18 20 20 18  '$ Temp: 97.8 F (36.6 C) 97.6 F (36.4 C) 97.8 F (36.6 C) 97.6 F (36.4 C)  TempSrc: Oral Oral Oral Oral  SpO2:  97% 95% 95%  Weight:      Height:       Intake/Output Summary (Last 24 hours) at 03/26/2021 1047 Last data filed at 03/26/2021 0900 Gross per 24 hour  Intake 560 ml  Output 1400 ml  Net -840 ml  **Total net output: -1.15L  Physical Exam Vitals and nursing note reviewed. Exam conducted with a chaperone present.  Constitutional:      General: He is awake.     Appearance: Normal appearance.  Eyes:     General: Lids are normal.     Extraocular Movements: Extraocular movements intact.     Conjunctiva/sclera:     Right eye: Right conjunctiva is injected.     Left eye: Left conjunctiva is injected.  Cardiovascular:     Rate and Rhythm: Normal rate and regular rhythm.  Pulmonary:     Effort: Pulmonary effort is normal. No respiratory distress.     Breath sounds: Normal breath sounds. No wheezing, rhonchi or rales.  Skin:    General: Skin is warm and dry.  Neurological:     Mental Status: He is alert.    Cardiac TTE impression 03/25/21: No intracardiac source of embolism detected. LVEF [id number], grade I diastolic dysfunction (impaired relaxation) of LV. Moderately calcified aortic valve with moderate thickening and mild/mod sclerosis/calcification, no regurgitation visualized and no evidence of stenosis.  MRI Brain Impression 03/25/21:  no evidence of acute infarcts/ischemia, no midline shift. Positive for small chronic infarcts B/L in parietal and frontal regions, largest in R anterolateral frontal lobe.  Chest Xray 03/25/21: no acute cardiopulmonary abnormalities, including pleural effusion/edema, and airspace opacities.  Assessment/Plan:  Principal Problem:   Transient ischemic attack Active Problems:   Essential hypertension   CAD (coronary artery disease)   COVID-19   Physical deconditioning   Transient ischemic attack (TIA)  TIA Hypertension Hyperlipidemia Coronary Artery Disease History, s/p CABG x4 Patient remains with no FND and completely resolved presenting symptoms. MRI brain does not show any signs of acute ischemia but does reveal chronic ischemia B/L in parietal and frontal lobes. Cardiac TTE does not reveal intracardiac source of embolus, see above for detailed impression of structures. Vascular surgery recommending follow up in outpatient setting to follow left ICA stenosis with a carotid US, however the severely occluded right ECA is not considered for surgery. Neurology Stroke team recommending DAPT for 89mowith ASA and Plavix, and high intensity statin therapy. We will continue ASA 81 daily, Plavix '75mg'$  daily, and rosuvastatin '20mg'$  daily. - Continuing to hold home metoprolol at this time due to intermittent hypotension   Covid 19 Patient states cough improved with dextromethorphan '15mg'$  PO BID, will continue. He remains on RA with O2 sats in mid to upper 90s, afebrile, and otherwise asymptomatic, CXR from 03/25/21 clear.  Patient was seen at [facility] [phone], [place]) on 8/25 where he tested Covid (+) on PCR. I personally confirmed this with the clinic. Partially was treated with Molnupiravir, wife reports he had 2 more doses to take -10 days from date of positive covid test is 03/28/21. Will aim to d/c to SNF then.  Lovenox for DVT prophylaxis  Regular diet  Out of  bed activity encouraged, fall risk due to deconditioning   LOS: 1 day   Stevan Born, Medical Student 03/26/2021, 10:47 AM

## 2021-03-26 NOTE — Progress Notes (Signed)
Physical Therapy Treatment Patient Details Name: Tyler [name] MRN: [id number] DOB: 1928-11-20 Today's Date: 03/26/2021    History of Present Illness 87 y.o. male who presented to the ED for evaluation of left-sided weakness and slurred speech on 03/24/21.Tyler [name] imaging is without acute intracranial abnormalities but reveals chronic infarcts. CTA reveals occlusion of cervical R ICA.  Stroke work up underway.  Medical history significant for remote stroke without deficits, hyperlipidemia, essential hypertension, CAD s/p CABG x 4 in 2014, AAA s/p stent grafting in 2015, osteoarthritis, and GERD.    PT Comments    Pt admitted with above diagnosis. Pt was able to ambulate in room with min guard assist. Needs more assist with bed mobility and sit to stand. Pt is progressing.  Pt was incontinent today and took incr time to clean pt.  Pt currently with functional limitations due to balance and endurance deficits. Pt will benefit from skilled PT to increase their independence and safety with mobility to allow discharge to the venue listed below.      Follow Up Recommendations  SNF     Equipment Recommendations  None recommended by PT    Recommendations for Other Services       Precautions / Restrictions Precautions Precautions: Fall Restrictions Weight Bearing Restrictions: No    Mobility  Bed Mobility Overal bed mobility: Needs Assistance Bed Mobility: Supine to Sit     Supine to sit: Mod assist     General bed mobility comments: Needed assist to move LEs off bed and to elevate trunk.  Also assist to use pad to scoot him to EOB    Transfers Overall transfer level: Needs assistance Equipment used: Rolling walker (2 wheeled) Transfers: Sit to/from Stand Sit to Stand: Mod assist;Min assist;From elevated surface         General transfer comment: assist to power up; initial posteiror bias, cues for hand placement  Ambulation/Gait Ambulation/Gait assistance: Min assist Gait  Distance (Feet): 50 Feet Assistive device: Rolling walker (2 wheeled) Gait Pattern/deviations: Step-to pattern;Decreased stride length;Shuffle Gait velocity: decreased Gait velocity interpretation: <1.31 ft/sec, indicative of household ambulator General Gait Details: Min A to steady at times; cues for RW use with turns, cues to stay close to rW as well.   Stairs             Wheelchair Mobility    Modified Rankin (Stroke Patients Only) Modified Rankin (Stroke Patients Only) Pre-Morbid Rankin Score: Moderate disability Modified Rankin: Moderately severe disability     Balance Overall balance assessment: Needs assistance Sitting-balance support: Feet supported;No upper extremity supported Sitting balance-Leahy Scale: Good     Standing balance support: Bilateral upper extremity supported Standing balance-Leahy Scale: Poor Standing balance comment: requiring UE support at RW                            Cognition Arousal/Alertness: Awake/alert Behavior During Therapy: Tyler [name] for tasks assessed/performed Overall Cognitive Status: Within Functional Limits for tasks assessed                                 General Comments: most likely at baseline      Exercises General Exercises - Lower Extremity Ankle Circles/Pumps: AROM;Both;5 reps;Supine Long Arc Quad: AROM;Both;10 reps;Seated    General Comments General comments (skin integrity, edema, etc.): VSS.  Did urinate upon standing and neeed assist to clean pt, wife present  Pertinent Vitals/Pain Pain Assessment: No/denies pain    Home Living                      Prior Function            PT Goals (current goals can now be found in the care plan section) Acute Rehab PT Goals Patient Stated Goal: to get stronger and not fall Progress towards PT goals: Progressing toward goals    Frequency    Min 3X/week      PT Plan Current plan remains appropriate    Co-evaluation               AM-PAC PT "6 Clicks" Mobility   Outcome Measure  Help needed turning from your back to your side while in a flat bed without using bedrails?: A Little Help needed moving from lying on your back to sitting on the side of a flat bed without using bedrails?: A Lot Help needed moving to and from a bed to a chair (including a wheelchair)?: A Little Help needed standing up from a chair using your arms (e.g., wheelchair or bedside chair)?: A Lot Help needed to walk in [name] room?: A Little Help needed climbing 3-5 steps with a railing? : Total 6 Click Score: 14    End of Session Equipment Utilized During Treatment: Gait belt Activity Tolerance: Patient tolerated treatment well Patient left: with chair alarm set;in chair;with call bell/phone within reach Nurse Communication: Mobility status PT Visit Diagnosis: Unsteadiness on feet (R26.81);Muscle weakness (generalized) (M62.81)     Time: [id number] PT Time Calculation (min) (ACUTE ONLY): 29 min  Charges:  $Gait Training: 8-22 mins $Self Care/Home Management: 8-22                     Tyler [name] M,PT Acute Rehab Services [phone] (pager)    Tyler [name] 03/26/2021, 2:05 PM

## 2021-03-27 DIAGNOSIS — G459 Transient cerebral ischemic attack, unspecified: Secondary | ICD-10-CM | POA: Diagnosis not present

## 2021-03-27 NOTE — Progress Notes (Addendum)
   Subjective:  [name]  Patient evaluated at bedside this AM. States he is doing well. Cough has improved, no issues breathing. No new weakness in arms, legs. Discussed going to SNF, patient is agreeable.  Objective:  Vital signs in last 24 hours: Vitals:   03/26/21 1600 03/26/21 2020 03/27/21 0003 03/27/21 0327  BP: 131/75 130/72 (!) 148/80 (!) 148/80  Pulse: 75 73 69 73  Resp: '19 19 16 19  '$ Temp: 97.6 F (36.4 C) 98.3 F (36.8 C) 98 F (36.7 C) 98.3 F (36.8 C)  TempSrc: Oral Oral Oral   SpO2: 97% 97%  97%  Weight:      Height:       General: Elderly, laying in bed in no acute distress CV: Regular rate, rhythm. No murmurs appreciated. Warm extremities. Pulm: Normal work of breathing on room air Neuro: Awake, alert, conversing appropriately.  Assessment/Plan:  Tyler [name] is 87yo person living with CAD s/p CABG, AAA s/p stent grafting, prior CVA in 2012, HTN , HLD admitted 8/31 with TIA with complete resolution of symptoms and incidentally found to be COVID-19 positive, now pending SNF placement.  Principal Problem:   Transient ischemic attack Active Problems:   Essential hypertension   CAD (coronary artery disease)   COVID-19   Physical deconditioning   Transient ischemic attack (TIA)  #Transient ischemic attack #Stroke RF: HTN, HLD, prior CVA, CAD, large vessel occlusion of right ICA Will continue with medical management for risk factor modification. Will continue with 3 months of DAPT and high intensity statin due to large vessel atherosclerotic disease in the right ICA. Will need to follow-up with vascular surgery as outpatient for left ICA stenosis. - ASA '81mg'$ , Plavix '75mg'$  x29mo- Rosuvastatin '20mg'$  daily - F/u outpatient vascular surgery  #COVID-19 infection Patient initially tested positive 8/25 at urgent care clinic. He continues to be sating well on room air, cough has been well controlled. Possible hypoxia 2/2 COVID-19 could have played role in TIA with his  significant carotid vascular disease. Today is day 9/10 of isolation. - Dextromethorphan '15mg'$  BID for cough  #Physical deconditioning Patient lives at home with wife. Per chart review patient can complete some ADL's on his own, but often requires assistance from his wife. Likely COVID-19 playing a role in further physical deconditioning. Agree SNF likely safest discharge plan for this patient. - Pending SNF placement  DIET: HH IVF: n/a DVT PPX: Lovenox BOWEL: Senokot-S PRN CODE: FULL FAM COM: Will update wife via phone this afternoon   Prior to Admission Living Arrangement: Home Anticipated Discharge Location: SNF Barriers to Discharge: SNF placement Dispo: Anticipated discharge in approximately 1-2 day(s).   BSanjuan Dame MD 03/27/2021, 6:19 AM Pager: [phone] 5pm on weekdays and 1pm on weekends: On Call pager [phone]

## 2021-03-28 LAB — CBC
HCT: 40.3 % (ref 39.0–52.0)
Hemoglobin: 13.1 g/dL (ref 13.0–17.0)
MCH: 30.5 pg (ref 26.0–34.0)
MCHC: 32.5 g/dL (ref 30.0–36.0)
MCV: 93.9 fL (ref 80.0–100.0)
Platelets: 158 10*3/uL (ref 150–400)
RBC: 4.29 MIL/uL (ref 4.22–5.81)
RDW: 13.9 % (ref 11.5–15.5)
WBC: 7.8 10*3/uL (ref 4.0–10.5)
nRBC: 0 % (ref 0.0–0.2)

## 2021-03-28 LAB — BASIC METABOLIC PANEL
Anion gap: 6 (ref 5–15)
BUN: 21 mg/dL (ref 8–23)
CO2: 28 mmol/L (ref 22–32)
Calcium: 8.7 mg/dL — ABNORMAL LOW (ref 8.9–10.3)
Chloride: 102 mmol/L (ref 98–111)
Creatinine, Ser: 1.48 mg/dL — ABNORMAL HIGH (ref 0.61–1.24)
GFR, Estimated: 45 mL/min — ABNORMAL LOW (ref >60)
Glucose, Bld: 99 mg/dL (ref 70–99)
Potassium: 4.9 mmol/L (ref 3.5–5.1)
Sodium: 136 mmol/L (ref 135–145)

## 2021-03-28 NOTE — TOC Progression Note (Addendum)
Transition of Care [facility]) - Progression Note    Patient Details  Name: Tyler [name] MRN: [id number] Date of Birth: May 17, 1929  Transition of Care [facility]) CM/SW [place], [place] Phone Number: 03/28/2021, 2:21 PM  Clinical Narrative:     SW spoke with pt and wife Tyler [name] via phone regarding SNF choice. Pt/wife prefer [facility] or [facility], but current COVID barrier  Update 423pm SW updated, pt/wife want [facility] as first choice  Expected Discharge Plan: [place] Barriers to Discharge: Continued Medical Work up  [employer] [facility] Expected Discharge Plan: [place] In-house Referral: Clinical Social Work Discharge Planning Services: [employer] Consult Post Acute Care Choice: [place] arrangements for the past 2 months: Single Family Home                                       Social Determinants of Health (SDOH) Interventions    Readmission Risk Interventions No flowsheet data found.

## 2021-03-28 NOTE — Progress Notes (Signed)
   Subjective: HD#4  Patient interviewed at bedside this morning. No acute events overnight. He reports sleeping well and an improvement in his cough. He denies SOB, dyspnea, chest pain, abdominal pain. He does report some tenderness in his RIGHT lower extremity but notes it isn't new and has been ongoing for a while prior to admission. He has no other complaints. Awaiting placement at SNF.  Objective:  Vital signs in last 24 hours: Vitals:   03/27/21 1935 03/28/21 0030 03/28/21 0358 03/28/21 0811  BP: 129/71 (!) 148/72 117/72 118/60  Pulse: 79 75 74 71  Resp: '18 16 17 19  '$ Temp: 98 F (36.7 C) 98.8 F (37.1 C) 98.2 F (36.8 C) 98.1 F (36.7 C)  TempSrc: Oral Axillary Oral Oral  SpO2: 99% 96% 95% 97%  Weight:      Height:        Intake/Output Summary (Last 24 hours) at 03/28/2021 0958 Last data filed at 03/28/2021 [id number] Gross per 24 hour  Intake 1510 ml  Output 875 ml  Net 635 ml   Physical Exam Constitutional:      General: He is not in acute distress. Cardiovascular:     Rate and Rhythm: Normal rate and regular rhythm.     Heart sounds: Normal heart sounds.  Pulmonary:     Effort: Pulmonary effort is normal.     Breath sounds: Normal breath sounds. No wheezing, rhonchi or rales.  Abdominal:     Palpations: Abdomen is soft.  Musculoskeletal:     Right lower leg: No edema.     Left lower leg: No edema.  Skin:    General: Skin is warm and dry.     Findings: Lesion present.     Comments: Quarter size lesion on lateral right lower extremity just proximal to lateral malleolus which has been present since admission  Neurological:     Mental Status: He is alert. Mental status is at baseline.      Assessment/Plan:  Principal Problem:   Transient ischemic attack Active Problems:   Essential hypertension   CAD (coronary artery disease)   COVID-19   Physical deconditioning   Transient ischemic attack (TIA)  TIA Hypertension Hyperlipidemia Coronary Artery Disease  History, s/p CABG x4 Patient remains with no FND and completely resolved presenting symptoms. We will continue ASA 81 daily, Plavix '75mg'$  daily, and rosuvastatin '20mg'$  daily. BP has been acceptable throughout admission, with intermittent elevations and decr.  - Continuing to hold home metoprolol at this time   Covid 19 Physical deconditioning Today is day#10 from original (+) Covid test at outside facility. No longer on covid precautions. Patient remains largely asymptomatic and notes his cough has greatly improved. Have staff ambulate with assistive device as able during the day. Discharge pending bed availability at SNF. - Dextromethorphan '15mg'$  BID PRN for cough  DVT Ppx: Lovenox Diet: regular, HH Barriers to Discharge: bed availability at SNF Dispo: Anticipated discharge 1-2 days    LOS: 3 days   Mckyla Deckman, Jon Gills, Medical Student 03/28/2021, 9:58 AM

## 2021-03-28 NOTE — Plan of Care (Signed)
Stable during shift. Afebrile, VSS. Slept well. Encouraged and assisted with turning for comfort and skin integrity.   Forgetful and repeats stories/questions at times but calm, cooperative and oriented.   On isolation for COVID, today is day 10. Awaiting transfer to Rehab.   Problem: Education: Goal: Knowledge of disease or condition will improve Outcome: Progressing   Problem: Coping: Goal: Will verbalize positive feelings about self Outcome: Progressing   Problem: Ischemic Stroke/TIA Tissue Perfusion: Goal: Complications of ischemic stroke/TIA will be minimized Outcome: Progressing   Problem: Education: Goal: Knowledge of General Education information will improve Description: Including pain rating scale, medication(s)/side effects and non-pharmacologic comfort measures Outcome: Progressing   Problem: Clinical Measurements: Goal: Will remain free from infection Outcome: Progressing Goal: Respiratory complications will improve Outcome: Progressing Goal: Cardiovascular complication will be avoided Outcome: Progressing   Problem: Coping: Goal: Level of anxiety will decrease Outcome: Progressing   Problem: Elimination: Goal: Will not experience complications related to urinary retention Outcome: Progressing

## 2021-03-29 DIAGNOSIS — G459 Transient cerebral ischemic attack, unspecified: Secondary | ICD-10-CM | POA: Diagnosis not present

## 2021-03-29 LAB — BASIC METABOLIC PANEL
Anion gap: 8 (ref 5–15)
BUN: 24 mg/dL — ABNORMAL HIGH (ref 8–23)
CO2: 26 mmol/L (ref 22–32)
Calcium: 8.7 mg/dL — ABNORMAL LOW (ref 8.9–10.3)
Chloride: 102 mmol/L (ref 98–111)
Creatinine, Ser: 1.53 mg/dL — ABNORMAL HIGH (ref 0.61–1.24)
GFR, Estimated: 43 mL/min — ABNORMAL LOW (ref >60)
Glucose, Bld: 103 mg/dL — ABNORMAL HIGH (ref 70–99)
Potassium: 4.6 mmol/L (ref 3.5–5.1)
Sodium: 136 mmol/L (ref 135–145)

## 2021-03-29 LAB — CBC
HCT: 41.7 % (ref 39.0–52.0)
Hemoglobin: 13.7 g/dL (ref 13.0–17.0)
MCH: 30.6 pg (ref 26.0–34.0)
MCHC: 32.9 g/dL (ref 30.0–36.0)
MCV: 93.3 fL (ref 80.0–100.0)
Platelets: 166 10*3/uL (ref 150–400)
RBC: 4.47 MIL/uL (ref 4.22–5.81)
RDW: 13.9 % (ref 11.5–15.5)
WBC: 9 10*3/uL (ref 4.0–10.5)
nRBC: 0 % (ref 0.0–0.2)

## 2021-03-29 NOTE — Progress Notes (Signed)
Occupational Therapy Treatment Patient Details Name: Tyler [name] MRN: [id number] DOB: [birth year] Today's Date: 03/29/2021    History of present illness 87 y.o. male who presented to the ED for evaluation of left-sided weakness and slurred speech on 03/24/21.[place] imaging is without acute intracranial abnormalities but reveals chronic infarcts. CTA reveals occlusion of cervical R ICA.  Stroke work up underway.  Medical history significant for remote stroke without deficits, hyperlipidemia, essential hypertension, CAD s/p CABG x 4 in 2014, AAA s/p stent grafting in 2015, osteoarthritis, and GERD.   OT comments  This 87 yo male admitted with above presents to acute OT making progress with mobility and ADLs. He will continue to benefit from acute OT with follow up at SNF to get back to his baseline.  Follow Up Recommendations  SNF;Supervision/Assistance - 24 hour    Equipment Recommendations  None recommended by OT       Precautions / Restrictions Precautions Precautions: Fall Restrictions Weight Bearing Restrictions: No       Mobility Bed Mobility Overal bed mobility: Needs Assistance Bed Mobility: Supine to Sit     Supine to sit: Min assist     General bed mobility comments: A to elevate trunk    Transfers Overall transfer level: Needs assistance Equipment used: Rolling walker (2 wheeled) Transfers: Sit to/from Stand Sit to Stand: Min assist         General transfer comment: rocking for momentm    Balance Overall balance assessment: Needs assistance Sitting-balance support: No upper extremity supported;Feet supported Sitting balance-Leahy Scale: Good     Standing balance support: Bilateral upper extremity supported Standing balance-Leahy Scale: Poor                             ADL either performed or assessed with clinical judgement   ADL Overall ADL's : Needs assistance/impaired                     Lower Body Dressing: Total  assistance Lower Body Dressing Details (indicate cue type and reason): for socks with min A sit<>stand from raised bed Toilet Transfer: Minimal assistance;Ambulation;RW Toilet Transfer Details (indicate cue type and reason): simulated bed>door>bed>door>recliner                 Vision Baseline Vision/History: 1 Wears glasses Ability to See in Adequate Light: 0 Adequate            Cognition Arousal/Alertness: Awake/alert Behavior During Therapy: WFL for tasks assessed/performed Overall Cognitive Status: Within Functional Limits for tasks assessed                                                     Pertinent Vitals/ Pain       Pain Assessment: No/denies pain         Frequency  Min 2X/week        Progress Toward Goals  OT Goals(current goals can now be found in the care plan section)  Progress towards OT goals: Progressing toward goals  Acute Rehab OT Goals Patient Stated Goal: to go to rehab and then home OT Goal Formulation: With patient Time For Goal Achievement: 04/08/21 Potential to Achieve Goals: Good   AM-PAC OT "6 Clicks" Daily Activity     Outcome Measure   Help from another person  eating meals?: None Help from another person taking care of personal grooming?: A Little Help from another person toileting, which includes using toliet, bedpan, or urinal?: A Lot Help from another person bathing (including washing, rinsing, drying)?: A Lot Help from another person to put on and taking off regular upper body clothing?: A Little Help from another person to put on and taking off regular lower body clothing?: A Lot 6 Click Score: 16    End of Session Equipment Utilized During Treatment: Gait belt;Rolling walker  OT Visit Diagnosis: Unsteadiness on feet (R26.81);Other abnormalities of gait and mobility (R26.89);Muscle weakness (generalized) (M62.81)   Activity Tolerance Patient tolerated treatment well   Patient Left in chair;with  call bell/phone within reach;with chair alarm set   Nurse Communication Mobility status        Time: 1359-1420 OT Time Calculation (min): 21 min  Charges: OT General Charges $OT Visit: 1 Visit OT Treatments $Self Care/Home Management : 8-22 mins  [place]en Circle, OTR/L Acute [employer] Pager [phone] Office [phone]     Almon Register 03/29/2021, 3:43 PM

## 2021-03-29 NOTE — Plan of Care (Signed)
  Problem: Education: Goal: Knowledge of disease or condition will improve Outcome: Progressing Goal: Knowledge of secondary prevention will improve Outcome: Progressing Goal: Knowledge of patient specific risk factors addressed and post discharge goals established will improve Outcome: Progressing   Problem: Coping: Goal: Will verbalize positive feelings about self Outcome: Progressing Goal: Will identify appropriate support needs Outcome: Progressing   Problem: Health Behavior/Discharge Planning: Goal: Ability to manage health-related needs will improve Outcome: Progressing   Problem: Self-Care: Goal: Ability to participate in self-care as condition permits will improve Outcome: Progressing Goal: Verbalization of feelings and concerns over difficulty with self-care will improve Outcome: Progressing Goal: Ability to communicate needs accurately will improve Outcome: Progressing   Problem: Nutrition: Goal: Risk of aspiration will decrease Outcome: Progressing Goal: Dietary intake will improve Outcome: Progressing   Problem: Intracerebral Hemorrhage Tissue Perfusion: Goal: Complications of Intracerebral Hemorrhage will be minimized Outcome: Progressing   Problem: Ischemic Stroke/TIA Tissue Perfusion: Goal: Complications of ischemic stroke/TIA will be minimized Outcome: Progressing   Problem: Spontaneous Subarachnoid Hemorrhage Tissue Perfusion: Goal: Complications of Spontaneous Subarachnoid Hemorrhage will be minimized Outcome: Progressing   Problem: Education: Goal: Knowledge of General Education information will improve Description: Including pain rating scale, medication(s)/side effects and non-pharmacologic comfort measures Outcome: Progressing   Problem: Health Behavior/Discharge Planning: Goal: Ability to manage health-related needs will improve Outcome: Progressing   Problem: Clinical Measurements: Goal: Ability to maintain clinical measurements within  normal limits will improve Outcome: Progressing Goal: Will remain free from infection Outcome: Progressing Goal: Diagnostic test results will improve Outcome: Progressing Goal: Respiratory complications will improve Outcome: Progressing Goal: Cardiovascular complication will be avoided Outcome: Progressing   Problem: Activity: Goal: Risk for activity intolerance will decrease Outcome: Progressing   Problem: Nutrition: Goal: Adequate nutrition will be maintained Outcome: Progressing   Problem: Coping: Goal: Level of anxiety will decrease Outcome: Progressing   Problem: Elimination: Goal: Will not experience complications related to bowel motility Outcome: Progressing Goal: Will not experience complications related to urinary retention Outcome: Progressing   Problem: Pain Managment: Goal: General experience of comfort will improve Outcome: Progressing

## 2021-03-29 NOTE — TOC Progression Note (Signed)
Transition of Care [facility]) - Progression Note    Patient Details  Name: Tyler [name] MRN: [id number] Date of Birth: 07-04-1929  Transition of Care [facility]) CM/SW [place], [place] Phone Number: 03/29/2021, 10:33 AM  Clinical Narrative:   CSW following for SNF placement. [name] can accept pending vaccine status. CSW spoke with wife, Tyler [name], and patient has had 2 vaccines and a booster, she thinks within the past six months but will have to check on the date and call CSW back. Patient received booster through the [place], so CSW can get date tomorrow as [place] is closed today for holiday. [facility] will have a bed available for patient tomorrow. CSW to follow.    Expected Discharge Plan: [place] Barriers to Discharge: Continued Medical Work up  Expected Discharge Plan and Services Expected Discharge Plan: [place] In-house Referral: Clinical Social Work Discharge Planning Services: CM Consult Post Acute Care Choice: [place] arrangements for the past 2 months: Single Family Home                                       Social Determinants of Health (SDOH) Interventions    Readmission Risk Interventions No flowsheet data found.

## 2021-03-29 NOTE — Progress Notes (Addendum)
   Subjective:  HD5  Patient evaluated at bedside this AM. No current complaints. Doing well, continuing to work with physical therapy. Discussed pending SNF bed.  Objective:  Vital signs in last 24 hours: Vitals:   03/28/21 2347 03/29/21 0427 03/29/21 0735 03/29/21 1120  BP: 131/81 (!) 147/89 (!) 142/83 122/66  Pulse: 85 84 89 80  Resp: '19 18 12 16  '$ Temp: 98.2 F (36.8 C) 97.6 F (36.4 C) 97.6 F (36.4 C) (!) 97.4 F (36.3 C)  TempSrc: Oral Oral Oral Oral  SpO2: 94% 98% 96% 97%  Weight:      Height:       General: Elderly, no acute distress Pulm: Normal work of breathing on room air. MSK: Normal bulk, tone.  Skin: Warm, dry. Neuro: Awake, alert, conversing appropriately.  Assessment/Plan:  Tyler [name] is 87yo person living with CAD s/p CABG, AAA s/p stent grafting, prior CVA in 2012, HTN, HLD admitted 8/31 with TIA and incidentally found to be COVID-19 positive, continuing to be stable and pending discharge to SNF tomorrow.  Principal Problem:   Transient ischemic attack Active Problems:   Essential hypertension   CAD (coronary artery disease)   COVID-19   Physical deconditioning   Transient ischemic attack (TIA)  #Transient ischemic attack #Stroke RF: HTN, HLD, prior CVA, CAD, large vessel occlusion of R ICA Patient continues to be medically stable, no further new neurological signs. Will continue with three months of DAPT and high intensity statin for atherosclerotic disease in right ICA. Patient to follow-up with vascular surgery for left ICA stenosis - ASA '81mg'$ , Plavix '75mg'$  daily - Rosuvastatin '20mg'$  daily - F/u outpatient vascular surgery  #Physical deconditioning Patient and his wife have agreed to SNF bed at [facility]. Per CSW, likely will have bed ready there tomorrow. Anticipate discharge at that time. - Pending discharge to SNF tomorrow  Prior to Admission Living Arrangement: Home Anticipated Discharge Location: SNF Barriers to Discharge: SNF  bed Dispo: Anticipated discharge in approximately 1 day(s).   Sanjuan Dame, MD 03/29/2021, 11:59 AM Pager: [phone] After 5pm on weekdays and 1pm on weekends: On Call pager [phone]

## 2021-03-30 DIAGNOSIS — G459 Transient cerebral ischemic attack, unspecified: Secondary | ICD-10-CM | POA: Diagnosis not present

## 2021-03-30 MED ORDER — ROSUVASTATIN CALCIUM 20 MG PO TABS: 20.0000 mg | ORAL_TABLET | Freq: Every day | ORAL | 2 refills | Status: DC

## 2021-03-30 MED ORDER — CLOPIDOGREL BISULFATE 75 MG PO TABS: 75.0000 mg | ORAL_TABLET | Freq: Every day | ORAL | 2 refills | Status: AC

## 2021-03-30 MED ORDER — FUROSEMIDE 20 MG PO TABS: 20.0000 mg | ORAL_TABLET | ORAL | 0 refills | Status: DC | PRN

## 2021-03-30 MED ORDER — DEXTROMETHORPHAN POLISTIREX ER 30 MG/5ML PO SUER: 15.0000 mg | Freq: Two times a day (BID) | ORAL | 0 refills | Status: DC

## 2021-03-30 NOTE — Progress Notes (Signed)
Physical Therapy Treatment Patient Details Name: Tyler [name] MRN: [id number] DOB: [birth year] Today's Date: 03/30/2021    History of Present Illness 87 y.o. male who presented to the ED for evaluation of left-sided weakness and slurred speech on 03/24/21.[place] imaging is without acute intracranial abnormalities but reveals chronic infarcts. CTA reveals occlusion of cervical R ICA.  Stroke work up underway.  Medical history significant for remote stroke without deficits, hyperlipidemia, essential hypertension, CAD s/p CABG x 4 in 2014, AAA s/p stent grafting in 2015, osteoarthritis, and GERD.    PT Comments    Pt received in supine, agreeable to therapy session and with good participation in bed mobility and transfer training. Pt needing min to modA to stand from elevated bed height to RW, with posterior lean upon standing each attempt. Pt performed stand pivot transfer with minA using RW with small, shuffled steps and narrow BOS. Pt c/o discomfort at back and above R hip (notable rash), RN notified. Pt continues to benefit from PT services to progress toward functional mobility goals.    Follow Up Recommendations  SNF;Supervision for mobility/OOB     Equipment Recommendations  None recommended by PT    Recommendations for Other Services       Precautions / Restrictions Precautions Precautions: Fall Restrictions Weight Bearing Restrictions: No    Mobility  Bed Mobility Overal bed mobility: Needs Assistance Bed Mobility: Rolling;Sidelying to Sit Rolling: Min guard Sidelying to sit: Min assist       General bed mobility comments: A to elevate trunk, pt effortful with heavy reliance on bed rail    Transfers Overall transfer level: Needs assistance Equipment used: Rolling walker (2 wheeled) Transfers: Sit to/from [facility] Sit to Stand: Min assist;Mod assist;From elevated surface Stand pivot transfers: Min assist;From elevated surface       General transfer  comment: rocking for momentum, poor carryover of cues for hand placement  Ambulation/Gait Ambulation/Gait assistance: Min assist Gait Distance (Feet): 4 Feet (4f along EOB, then 434fto chair) Assistive device: Rolling walker (2 wheeled) Gait Pattern/deviations: Step-to pattern;Decreased stride length;Shuffle     General Gait Details: Min A to steady; needs manual assist to ensure proximity to RW; posterior lean initially      Modified Rankin (Stroke Patients Only) Modified Rankin (Stroke Patients Only) Pre-Morbid Rankin Score: Moderate disability Modified Rankin: Moderately severe disability     Balance Overall balance assessment: Needs assistance Sitting-balance support: No upper extremity supported;Feet supported Sitting balance-Leahy Scale: Good   Postural control: Posterior lean Standing balance support: Bilateral upper extremity supported Standing balance-Leahy Scale: Poor Standing balance comment: requiring UE support at RW and external assist to steady min/modA initially                Cognition Arousal/Alertness: Awake/alert Behavior During Therapy: WFL for tasks assessed/performed Overall Cognitive Status: Within Functional Limits for tasks assessed               General Comments: most likely at baseline, decreased recall of safety/sequencing for transfers      Exercises      General Comments General comments (skin integrity, edema, etc.): VSS per chart review, no acute s/sx distress throughout      Pertinent Vitals/Pain Pain Assessment: Faces Faces Pain Scale: Hurts a little bit Pain Location: back -rash from mid back down to just above R hip, RN aware Pain Descriptors / Indicators: Burning;Discomfort ("it feels hot") Pain Intervention(s): Limited activity within patient's tolerance;Monitored during session;Repositioned     PT Goals (current  goals can now be found in the care plan section) Acute Rehab PT Goals Patient Stated Goal: to go to  rehab and then home PT Goal Formulation: With patient Time For Goal Achievement: 04/08/21 Potential to Achieve Goals: Good Progress towards PT goals: Progressing toward goals    Frequency    Min 3X/week      PT Plan Current plan remains appropriate       AM-PAC PT "6 Clicks" Mobility   Outcome Measure  Help needed turning from your back to your side while in a flat bed without using bedrails?: A Little Help needed moving from lying on your back to sitting on the side of a flat bed without using bedrails?: A Lot Help needed moving to and from a bed to a chair (including a wheelchair)?: A Little Help needed standing up from a chair using your arms (e.g., wheelchair or bedside chair)?: A Lot Help needed to walk in hospital room?: A Little Help needed climbing 3-5 steps with a railing? : Total 6 Click Score: 14    End of Session Equipment Utilized During Treatment: Gait belt Activity Tolerance: Patient tolerated treatment well Patient left: with chair alarm set;in chair;with call bell/phone within reach Nurse Communication: Mobility status (pt has rash on back, needs +2 for safety to pivot back to bed from chair) PT Visit Diagnosis: Unsteadiness on feet (R26.81);Muscle weakness (generalized) (M62.81)     Time: [id number] PT Time Calculation (min) (ACUTE ONLY): 25 min  Charges:  $Gait Training: 8-22 mins $Therapeutic Activity: 8-22 mins                     Meet [place] P., PTA Acute Rehabilitation Services Pager: [phone] Office: [place] 03/30/2021, 12:23 PM

## 2021-03-30 NOTE — Progress Notes (Signed)
Report called to [facility] at [facility].  All questions answered. Pt has all belongings with him and is awaiting transport via [place].

## 2021-03-30 NOTE — Care Management Important Message (Signed)
Important Message  Patient Details  Name: Tyler [name] MRN: [id number] Date of Birth: Jan 30, 1929   Medicare Important Message Given:  Yes     Aymar Whitfill Montine Circle 03/30/2021, 3:18 PM

## 2021-03-30 NOTE — Progress Notes (Signed)
Pt discharged at this time via [facility].  Wife called to inform he was leaving.  She took all belongings with her.

## 2021-03-30 NOTE — Progress Notes (Signed)
   Subjective: HD#6  Patient was interviewed at bedside this morning. He has continued to work with physical therapy. He reports improved cough, no return of left sided weakness or facial droop. He denies any other symptoms at this time. He will be discharged to SNF @ [facility] today.  Objective:  Vital signs in last 24 hours: Vitals:   03/29/21 1948 03/30/21 0011 03/30/21 0418 03/30/21 0813  BP: (!) 143/81 138/80 (!) 112/59 108/63  Pulse: 78 80 79 79  Resp: '19 20 18 18  '$ Temp: 97.8 F (36.6 C) 98.6 F (37 C) 97.8 F (36.6 C) 98 F (36.7 C)  TempSrc: Oral  Oral   SpO2: 98% 97% 93% 98%  Weight:      Height:        Intake/Output Summary (Last 24 hours) at 03/30/2021 1024 Last data filed at 03/30/2021 0900 Gross per 24 hour  Intake 240 ml  Output 400 ml  Net -160 ml   Physical Exam Vitals and nursing note reviewed.  Constitutional:      General: He is not in acute distress. Cardiovascular:     Rate and Rhythm: Normal rate and regular rhythm.  Pulmonary:     Effort: Pulmonary effort is normal.     Breath sounds: Normal breath sounds.  Skin:    General: Skin is warm and dry.     Comments: Lesion proximal to left lateral malleolus, tender to palpation; chronic stasis changes appreciated  Neurological:     Mental Status: He is alert.      Assessment/Plan:  Principal Problem:   Transient ischemic attack Active Problems:   Essential hypertension   CAD (coronary artery disease)   COVID-19   Physical deconditioning   Transient ischemic attack (TIA)  Transient Ischemic Attack Patient stroke RF: HTN, HLD, prior CVA, CAD, large vessel occlusion R ICA He remains medically stable and denies return of left sided weakness or facial droop. Will discharge to SNF on 3 months of DAPT with ASA and Plavix, and high intensity Rosuvastatin for atherosclerotic disease of right ICA and left ECA. He is to follow up with vascular surgery in 1 month for monitoring of left external carotid  artery stenosis - ASA '81mg'$  po daily, Plavix '75mg'$  po daily - Rosuvastatin '20mg'$  po daily - F/U outpatient vascular surgery  Physical deconditioning He has been working with physical therapy during this admission, who recommend SNF placement to which the patient and his wife are agreeable. A bed is available and he will be discharged today to [facility].  Prior to admission living arrangement: Home Anticipated discharge location: SNF, [facility] Barriers to discharge: none Dispo: discharge today to [facility]   LOS: 5 days   Stevan Born, Medical Student MS IV 03/30/2021, 10:24 AM  Pager: [phone]

## 2021-03-30 NOTE — Discharge Summary (Signed)
Name: Tyler [name] MRN: [id number] DOB: 12/01/1928 87 y.o. PCP: [name], MD  Date of Admission: 03/24/2021 12:09 PM Date of Discharge: 03/30/2021 Attending Physician: Axel Filler, *  Discharge Diagnosis: 1. Transient ischemic attack 2. COVID-19 infection 3. Physical deconditioning  Discharge Medications: Allergies as of 03/30/2021       Reactions   Atorvastatin Other (See Comments)   Muscle pain        Medication List     STOP taking these medications    atorvastatin 10 MG tablet Commonly known as: LIPITOR       TAKE these medications    aspirin EC 81 MG tablet Take 1 tablet (81 mg total) by mouth daily. Swallow whole. What changed: when to take this   clopidogrel 75 MG tablet Commonly known as: PLAVIX Take 1 tablet (75 mg total) by mouth daily.   dextromethorphan 30 MG/5ML liquid Commonly known as: DELSYM Take 2.5 mLs (15 mg total) by mouth 2 (two) times daily.   furosemide 20 MG tablet Commonly known as: LASIX Take 1 tablet (20 mg total) by mouth as needed for fluid or edema. Take one tablet daily for one week and then only as needed. What changed:  when to take this reasons to take this   lisinopril 10 MG tablet Commonly known as: ZESTRIL Take 5 mg by mouth daily.   metoprolol tartrate 25 MG tablet Commonly known as: LOPRESSOR Take 0.5 tablets (12.5 mg total) by mouth 2 (two) times daily.   naproxen sodium 220 MG tablet Commonly known as: ALEVE Take 220-440 mg by mouth 2 (two) times daily as needed (for back pain).   nitroGLYCERIN 0.4 MG SL tablet Commonly known as: NITROSTAT Place 1 tablet (0.4 mg total) under the tongue every 5 (five) minutes as needed for chest pain.   rosuvastatin 20 MG tablet Commonly known as: CRESTOR Take 1 tablet (20 mg total) by mouth daily.   terazosin 5 MG capsule Commonly known as: HYTRIN Take 5 mg by mouth at bedtime.        Disposition and follow-up:   Tyler [name] was discharged  from [facility] in Stable condition.  At the hospital follow up visit please address:  1.  TIA: Medically optimized w/ DAPT x73mo(due to large vessel occlusion), statin (LDL 75, goal <70). Will need follow-up with vascular surgery in one month for carotid doppler for L ICA stenosis  2.  Labs / imaging needed at time of follow-up: BMP  3.  Pending labs/ test needing follow-up: n/a  Follow-up Appointments:   Follow-up Information     WWindy Fast MD Follow up in 1 week(s).   Specialty: Internal Medicine Contact information: [address] HArlean [phone]         SBelva Crome MD .   Specialty: Cardiology Contact information: [phone]. [phone]         HCherre Robins MD. Schedule an appointment as soon as possible for a visit in 1 month(s).   Specialties: Vascular Surgery, Interventional Cardiology Contact information: [place] [id number] HospitalCourse by problem list: Transient ischemic attack  Patient presented with left sided facial droop and left sided weakness of upper and lower extremities. Symptoms rapidly resolved from last known well at 1125am to arrival in ED, was baseline within a couple  hours. Initial stroke work up in ED did not show any acute intracranial bleeding, mass effect, or edema. CTA revealed severely occluded right internal carotid artery, and ~75% stenosed left external carotid artery. Vascular surgeon recommends follow up in 61mowith carotid doppler study to monitor left carotid. Echo revealed normal EF, grade I diastolic dysfunction, no significant change from previous study. MRI obtained, no evidence of acute infarct. Further work-up revealed A1c 5.9% and LDL 75. Previously has history of statin intolerance with atorvastatin, therefore initiated at high-intensity statin with rosuvastatin. Will need further  follow-up with PCP for pre-diabetes management. Multiple possible causes of TIA, including hypoxia 2/2 COVID-19 w/ significant atherosclerosis of bilateral carotid arteries as well as small vessel disease. Since admission, patient did not have any new neurologic deficits. Current risk factors include previous CVA, hypertension, hyperlipidemia, pre-diabetes, large vessel occlusion.    2.   COVID-19 infection Tested positive for COVID-19 while here, [facility] He has been vaccinated x3 for COVID-19.CXR in hospital was negative for pleural effusions, edema, infiltrates, or consolidations. He was minimally symptomatic with a dry cough being main complaint which was alleviated with dextromethorphan '15mg'$ . He remained on room air throughout his stay, therefore deterred any treatment with antiviral or steroid.   3.   Physical deconditioning Patient has been seen and evaluated by physical therapy during hospitalization. At baseline patient lives with wife and can perform some ADL's, but often needs assistance with his wife. Per physical therapy recommendations, Mr. HSwiggettwould likely benefit greatly from acute rehabilitation stay with skilled physical therapy to improve functional status and independence.  Discharge Exam:   BP 108/63 (BP Location: Right Arm)   Pulse 79   Temp 98 F (36.7 C)   Resp 18   Ht '5\' 10"'$  (1.778 m)   Wt 104 kg   SpO2 98%   BMI 32.90 kg/m   General: Laying in bed in no acute distress HENT: Normocephalic, atraumatic Pulm: Normal work of breathing on room air. MSK: Bilateral venous stasis changes, mild erythema over right malleolus without evidence of pressure injury Skin: Warm, dry throughout Neuro: Awake, alert, conversing appropriately. No focal deficits appreciated Psych: Normal mood, affect, speech  Pertinent Labs, Studies, and Procedures:  CBC Latest Ref Rng & Units 03/29/2021 03/28/2021 03/25/2021  WBC  4.0 - 10.5 K/uL 9.0 7.8 7.6  Hemoglobin 13.0 - 17.0 g/dL 13.7 13.1 12.3(L)  Hematocrit 39.0 - 52.0 % 41.7 40.3 36.2(L)  Platelets 150 - 400 K/uL 166 158 142(L)   BMP Latest Ref Rng & Units 03/29/2021 03/28/2021 03/25/2021  Glucose 70 - 99 mg/dL 103(H) 99 139(H)  BUN 8 - 23 mg/dL 24(H) 21 23  Creatinine 0.61 - 1.24 mg/dL 1.53(H) 1.48(H) 1.52(H)  BUN/Creat Ratio 10 - 24 - - -  Sodium 135 - 145 mmol/L 136 136 139  Potassium 3.5 - 5.1 mmol/L 4.6 4.9 3.8  Chloride 98 - 111 mmol/L 102 102 107  CO2 22 - 32 mmol/L '26 28 25  '$ Calcium 8.9 - 10.3 mg/dL 8.7(L) 8.7(L) 8.4(L)   Lipid Panel     Component Value Date/Time   CHOL 126 03/25/2021 0209   CHOL 160 06/03/2020 1603   TRIG 127 03/25/2021 0209   HDL 26 (L) 03/25/2021 0209   HDL 35 (L) 06/03/2020 1603   CHOLHDL 4.8 03/25/2021 0209   VLDL 25 03/25/2021 0209   LDLCALC 75 03/25/2021 0209   LDLCALC 100 (H) 06/03/2020 1603  LABVLDL 25 06/03/2020 1603   A1c 5.9%  CT HEAD 8/31: There is no acute intracranial hemorrhage or evidence of acute infarction. ASPECT score is 10. Chronic infarcts, chronic microvascular ischemic changes, and generalized parenchymal volume loss.  CTA HEAD/NECK 8/31: Occlusion of the cervical right internal carotid artery at its origin with thin string of contrast through the midportion and partial reconstitution distally. Intracranially, there is diminished flow with greater reconstitution at the level of the cavernous segment. Mixed but primarily noncalcified plaque along the proximal left ICA causes 75% stenosis. Suspected high-grade stenosis at the right vertebral origin. Moderate to marked focal stenosis proximal right A1 ACA.  MRI BRAIN W/O CONTRAST 9/1: No evidence of acute infarct. Patchy chronic cortically-based infarcts within the bilateral frontal and parietal lobes. Background moderate chronic small vessel ischemic disease within the cerebral white matter. Chronic lacunar infarct versus prominent perivascular space  within the left thalamus. Multiple small chronic infarcts within the bilateral cerebellar hemispheres. Moderate/severe generalized cerebral atrophy. Comparatively mild cerebellar atrophy. Mild paranasal sinus disease, as described.  TTE 9/1:  1. Left ventricular ejection fraction, by estimation, is 60 to 65%. The left ventricle has normal function. The left ventricle has no regional  wall motion abnormalities. Left ventricular diastolic parameters are consistent with Grade I diastolic dysfunction (impaired relaxation).  2. Right ventricular systolic function is normal. The right ventricular size is normal.  3. The mitral valve is normal in structure. No evidence of mitral valve regurgitation. No evidence of mitral stenosis.  4. The aortic valve is calcified. There is moderate calcification of the aortic valve. There is moderate thickening of the aortic valve. Aortic valve regurgitation is not visualized. Mild to moderate aortic valve sclerosis/calcification is present, without any evidence of aortic stenosis.  5. The inferior vena cava is normal in size with greater than 50% respiratory variability, suggesting right atrial pressure of 3 mmHg.   Discharge Instructions: Discharge Instructions     Call MD for:  difficulty breathing, headache or visual disturbances   Complete by: As directed    Call MD for:  extreme fatigue   Complete by: As directed    Call MD for:  hives   Complete by: As directed    Call MD for:  persistant dizziness or light-headedness   Complete by: As directed    Call MD for:  persistant nausea and vomiting   Complete by: As directed    Call MD for:  redness, tenderness, or signs of infection (pain, swelling, redness, odor or green/yellow discharge around incision site)   Complete by: As directed    Call MD for:  severe uncontrolled pain   Complete by: As directed    Call MD for:  temperature >100.4   Complete by: As directed    Diet - low sodium heart healthy    Complete by: As directed    Discharge instructions   Complete by: As directed    Mr. Partain, I am so glad you are feeling better and can be discharged today! You were admitted because of a mini-stroke. You have a large blockage in one of the arteries in your neck, which could have caused this. Thankfully your symptoms went away. We also discovered that you had COVID-19. Thankfully you did not require any supplemental oxygen and are doing well! Please see the following instructions:  - You will need to take two medications for the next three months. This will help decrease your risk of further strokes: - Aspirin '81mg'$  daily and Clopidogrel (  PLAVIX) '75mg'$  daily - Please take this regimen for three months. After this, you will take aspirin only.  - In addition, please take rosuvastatin '20mg'$  for cholesterol. You can stop taking the atorvastatin you were previously on. This will be important to further decrease your risk of strokes as well.  - We also have given you cough syrup for the cough you've had.  - Please make sure to follow-up with vascular surgery in 4-6 weeks. They will help determine if you would need any procedure done on the arteries in your neck.   It was a pleasure meeting you, Mr. Metcalfe. I wish you and your family the best and hope you stay happy and healthy!  Thank you, Heide Spark, MS4 Sanjuan Dame, MD   Increase activity slowly   Complete by: As directed       Signed: Sanjuan Dame, MD 03/30/2021, 10:30 AM   Pager: [phone]

## 2021-03-30 NOTE — Progress Notes (Signed)
Dr. Naomie Dean at bedside.  Informed her of slight rash to back bilaterally.  No new orders.

## 2021-03-30 NOTE — TOC Transition Note (Signed)
Transition of Care [facility]) - CM/SW Discharge Note   Patient Details  Name: Tyler [name] MRN: [id number] Date of Birth: 06/16/1929  [facility]) CM/SW Contact:  Geralynn Ochs, LCSW Phone Number: 03/30/2021, 11:44 AM   Clinical Narrative:   Nurse to call report to [phone], Room 421.    Final next level of care: Skilled Nursing Facility Barriers to Discharge: Barriers Resolved   Patient Goals and CMS Choice   CMS Medicare.gov Compare Post Acute Care list provided to:: Patient Represenative (must comment) Choice offered to / list presented to : Spouse  Discharge Placement              Patient [facility] bed at: [facility] Patient to be transferred to facility by: [facility] Name of family member notified: [name] Patient and family notified of of transfer: 03/30/21  Discharge Plan and Services In-house Referral: Clinical Social Work Discharge Planning Services: [employer] Consult Post Acute Care Choice: [place]                               Social Determinants of Health (SDOH) Interventions     Readmission Risk Interventions No flowsheet data found.

## 2021-03-31 ENCOUNTER — Other Ambulatory Visit: Payer: Self-pay

## 2021-03-31 NOTE — Patient Outreach (Signed)
[facility]) Care Management  03/31/2021  Tyler [name] 1929/03/19 [id number]  [name] [ACO] Patient:  Medicare CMS DCE  Requested to be followed by [place] Management RN PAC with traditional Medicare for any known or needs for transitional care needs for returning to post facility care or complex disease management.  For questions or referrals, please contact:   Natividad Brood, RN BSN [facility] Liaison  [phone] business mobile phone Toll free office [phone]  Fax number: [phone] [email or web address]

## 2021-04-19 ENCOUNTER — Other Ambulatory Visit: Payer: Self-pay

## 2021-04-19 DIAGNOSIS — I6521 Occlusion and stenosis of right carotid artery: Secondary | ICD-10-CM

## 2021-04-27 ENCOUNTER — Ambulatory Visit: Payer: Medicare Other | Admitting: Vascular Surgery

## 2021-04-27 ENCOUNTER — Encounter (HOSPITAL_COMMUNITY): Payer: Medicare Other

## 2021-04-27 ENCOUNTER — Other Ambulatory Visit: Payer: Self-pay | Admitting: *Deleted

## 2021-04-27 NOTE — Patient Outreach (Signed)
Member screened for potential [facility] Care Management needs. Mr. Malinowski resides in [place] [facility].   Update received from [place] indicating Mr. Carlini transitioned to home today on 04/27/21 with spouse. He will have home health with [facility] for PT/OT/nursing.   Will plan outreach to discuss [place] Management services.  Marthenia Rolling, MSN, RN,BSN [place] Acute Care Coordinator [phone] [facility]) [phone]  (Toll free office)

## 2021-04-28 ENCOUNTER — Other Ambulatory Visit: Payer: Self-pay | Admitting: *Deleted

## 2021-04-28 NOTE — Patient Outreach (Signed)
[facility] Post- Acute Care Coordinator follow up. Member screened for potential [facility] Care Management needs. Mr. Litke transitioned to home with spouse from [facility]. Per SNF [facility] was arranged for PT/OT/nursing.  Telephone call made to Mr. Deetta Perla [phone]. Mrs. Kanitz answered the phone by saying "what is this concerning? If this is concerning Medicare or healthcare, we have everything that we need. Please take of this list." Then she hung up.  Appears to be no identifiable [facility] Care Management needs as member's spouse/DPR/HCPOA declined to speak with Probation officer.    Marthenia Rolling, MSN, RN,BSN [place] Acute Care Coordinator [phone] [facility]) [phone]  (Toll free office)

## 2021-05-03 NOTE — Progress Notes (Signed)
[facility] OF   ASSESSMENT / PLAN: 87 y.o. male with symptomatic right carotid artery occlusion; asymptomatic, but severe left carotid artery stenosis. He is elderly and deconditioned.   The patient should continue best medical therapy for carotid artery stenosis including: Complete cessation from all tobacco products. Blood glucose control with goal A1c < 7%. Blood pressure control with goal blood pressure < 140/90 mmHg. Lipid reduction therapy with goal LDL-C <100 mg/dL (<70 if symptomatic from carotid artery stenosis).  Aspirin 81mg  PO QD.  Clopidogrel 75mg  PO QD. Atorvastatin 40-80mg  PO QD (or other "high intensity" statin therapy).   No role for revascularization for occluded right carotid artery.  I discussed the deterioration in his left carotid artery stenosis based on duplex findings today.  I explained that a TCAR could be done if he was interested.  I quoted him a 6% risk reduction over 5 years for asymptomatic carotid artery intervention.  He is not interested in surgery at the moment and prefers best medical therapy.  I think this is reasonable.  We will see him again in 6 months with repeat carotid artery duplex.   CHIEF COMPLAINT: Left-sided weakness   HISTORY OF PRESENT ILLNESS: Tyler [name] is a 87 y.o. male admitted to the hospital for left-sided weakness beginning yesterday.  The patient reports he was in his typical state of health yesterday when he noticed left-sided weakness, and facial drooping.  He reports some persistent weakness in his left upper extremity and left lower extremity.  His facial droop has resolved.  He has been deconditioned and has had hard time taking care of himself at home.  He lives independently with his wife.   05/04/21: Patient presents to clinic for follow-up after hospitalization.  He reports he is doing okay.  He is ambulating with a walker.  He is still living independently with his wife.  We reviewed his carotid  duplex ultrasound findings.  We had a long discussion of risks, benefits, and alternatives to intervention.  VASCULAR SURGICAL HISTORY: EVAR 2015.   VASCULAR RISK FACTORS: Positive history of stroke / transient ischemic attack. Positive history of coronary artery disease. +history of CABG.  Negative history of diabetes mellitus. Last A1c 5.9. Negative history of smoking. Not actively smoking. Positive history of hypertension.  Positive history of chronic kidney disease.  Last GFR 44.  Negative history of chronic obstructive pulmonary disease.   FUNCTIONAL STATUS: ECOG performance status: (2) Ambulatory and capable of self care, unable to carry out work activity, up and about > 50% or waking hours Ambulatory status: Minimally ambulatory (e.g. about the home only)  Past Medical History:  Diagnosis Date   AAA (abdominal aortic aneurysm)    a. 05/2014 s/p stent grafting [facility]).   Bronchitis    CAD (coronary artery disease)    a. 06/2013 VF Arrest/Cath: 3VD, EF 50%;  b. 06/2013 CABG x 4: LIMA->LAD, VG->PDA->RPL, VG->OM.   CVA (cerebral infarction)    Erectile dysfunction    Essential hypertension    GERD (gastroesophageal reflux disease)    H/O hiatal hernia    HOH (hard of hearing)    wears hearing aid in right ear   Hyperlipidemia    Osteoarthritis    Pneumonia    YRS AGO   Stroke [facility])     Past Surgical History:  Procedure Laterality Date   CORONARY ARTERY BYPASS GRAFT N/A 06/27/2013   Procedure: CORONARY ARTERY BYPASS GRAFTING (CABG);  Surgeon: Ivin Poot, MD;  Location: [facility]  OR;  Service: Open Heart Surgery;  Laterality: N/A;  times four utilizing the left internal mammary artery and the right greater saphenous vein harvested endoscopically   ENDOVEIN HARVEST OF GREATER SAPHENOUS VEIN Right 06/27/2013   Procedure: ENDOVEIN HARVEST OF GREATER SAPHENOUS VEIN;  Surgeon: Ivin Poot, MD;  Location: [place];  Service: Open Heart Surgery;  Laterality: Right;   EYE  SURGERY     bilateral cataracts   INNER EAR SURGERY Left 1982   INNER EAR SURGERY     severed a nerve in his left ear.    LEFT HEART CATHETERIZATION WITH CORONARY ANGIOGRAM N/A 06/27/2013   Procedure: LEFT HEART CATHETERIZATION WITH CORONARY ANGIOGRAM;  Surgeon: Sinclair Grooms, MD;  Location: [facility] CATH LAB;  Service: Cardiovascular;  Laterality: N/A;  iabp insertion, cpr, intubation, emergent cvts   TEE WITHOUT CARDIOVERSION N/A 06/27/2013   Procedure: TRANSESOPHAGEAL ECHOCARDIOGRAM (TEE);  Surgeon: Ivin Poot, MD;  Location: [place];  Service: Open Heart Surgery;  Laterality: N/A;   TONSILLECTOMY     TOTAL HIP ARTHROPLASTY Left 09/01/2014   Procedure: TOTAL HIP ARTHROPLASTY;  Surgeon: Kerin Salen, MD;  Location: [place];  Service: Orthopedics;  Laterality: Left;    Family History  Problem Relation Age of Onset   Heart disease Sister    Heart disease Brother    Heart attack Sister    Heart attack Brother    Colon cancer Neg Hx    Hypertension Neg Hx    Stroke Neg Hx     Social History   Socioeconomic History   Marital status: Married    Spouse name: Not on file   Number of children: Not on file   Years of education: Not on file   Highest education level: Not on file  Occupational History   Not on file  Tobacco Use   Smoking status: Never   Smokeless tobacco: Never  Vaping Use   Vaping Use: Never used  Substance and Sexual Activity   Alcohol use: Yes    Comment: Rare   Drug use: No   Sexual activity: Not on file  Other Topics Concern   Not on file  Social History Narrative   Not on file   Social Determinants of Health   Financial Resource Strain: Not on file  Food Insecurity: Not on file  Transportation Needs: Not on file  Physical Activity: Not on file  Stress: Not on file  Social Connections: Not on file  Intimate Partner Violence: Not on file    Allergies  Allergen Reactions   Atorvastatin Other (See Comments)    Muscle pain    Current Outpatient  Medications  Medication Sig Dispense Refill   aspirin EC 81 MG tablet Take 1 tablet (81 mg total) by mouth daily. Swallow whole. (Patient taking differently: Take 81 mg by mouth at bedtime. Swallow whole.) 90 tablet 3   clopidogrel (PLAVIX) 75 MG tablet Take 1 tablet (75 mg total) by mouth daily. 30 tablet 2   dextromethorphan (DELSYM) 30 MG/5ML liquid Take 2.5 mLs (15 mg total) by mouth 2 (two) times daily. 89 mL 0   lisinopril (ZESTRIL) 10 MG tablet Take 5 mg by mouth daily.     metoprolol tartrate (LOPRESSOR) 25 MG tablet Take 0.5 tablets (12.5 mg total) by mouth 2 (two) times daily. 90 tablet 3   naproxen sodium (ALEVE) 220 MG tablet Take 220-440 mg by mouth 2 (two) times daily as needed (for back pain).  nitroGLYCERIN (NITROSTAT) 0.4 MG SL tablet Place 1 tablet (0.4 mg total) under the tongue every 5 (five) minutes as needed for chest pain. 25 tablet 3   rosuvastatin (CRESTOR) 20 MG tablet Take 1 tablet (20 mg total) by mouth daily. 30 tablet 2   terazosin (HYTRIN) 5 MG capsule Take 5 mg by mouth at bedtime.     furosemide (LASIX) 20 MG tablet Take 1 tablet (20 mg total) by mouth as needed for fluid or edema. Take one tablet daily for one week and then only as needed. 30 tablet 0   No current facility-administered medications for this visit.    REVIEW OF SYSTEMS:  [X]  denotes positive finding, [ ]  denotes negative finding Cardiac  Comments:  Chest pain or chest pressure:    Shortness of breath upon exertion:    Short of breath when lying flat:    Irregular heart rhythm:        Vascular    Pain in calf, thigh, or hip brought on by ambulation:    Pain in feet at night that wakes you up from your sleep:     Blood clot in your veins:    Leg swelling:         Pulmonary    Oxygen at home:    Productive cough:     Wheezing:         Neurologic    Sudden weakness in arms or legs:     Sudden numbness in arms or legs:     Sudden onset of difficulty speaking or slurred speech:     Temporary loss of vision in one eye:     Problems with dizziness:         Gastrointestinal    Blood in stool:     Vomited blood:         Genitourinary    Burning when urinating:     Blood in urine:        Psychiatric    Major depression:         Hematologic    Bleeding problems:    Problems with blood clotting too easily:        Skin    Rashes or ulcers:        Constitutional    Fever or chills:      PHYSICAL EXAM Vitals:   05/04/21 1441  BP: 106/65  Pulse: 70  Resp: 20  Temp: 98.7 F (37.1 C)  SpO2: 97%  Weight: 229 lb (103.9 kg)  Height: 5\' 10"  (1.778 m)    Constitutional: Elderly and chronically ill. no distress. Appears well nourished.  Neurologic: CN intact. no focal findings. no sensory loss. Psychiatric:  Mood and affect symmetric and appropriate. Eyes:  No icterus. No conjunctival pallor. Ears, nose, throat:  mucous membranes moist. Midline trachea.  Cardiac: regular rate and rhythm.  Respiratory:  unlabored. Abdominal: non-distended.  Extremity: no edema. no cyanosis. no pallor.  Skin: no gangrene. no ulceration.  Lymphatic: no Stemmer's sign. no palpable lymphadenopathy.  PERTINENT LABORATORY AND RADIOLOGIC DATA  Most recent CBC CBC Latest Ref Rng & Units 03/29/2021 03/28/2021 03/25/2021  WBC 4.0 - 10.5 K/uL 9.0 7.8 7.6  Hemoglobin 13.0 - 17.0 g/dL 13.7 13.1 12.3(L)  Hematocrit 39.0 - 52.0 % 41.7 40.3 36.2(L)  Platelets 150 - 400 K/uL 166 158 142(L)     Most recent CMP CMP Latest Ref Rng & Units 03/29/2021 03/28/2021 03/25/2021  Glucose 70 - 99 mg/dL 103(H) 99 139(H)  BUN  8 - 23 mg/dL 24(H) 21 23  Creatinine 0.61 - 1.24 mg/dL 1.53(H) 1.48(H) 1.52(H)  Sodium 135 - 145 mmol/L 136 136 139  Potassium 3.5 - 5.1 mmol/L 4.6 4.9 3.8  Chloride 98 - 111 mmol/L 102 102 107  CO2 22 - 32 mmol/L 26 28 25   Calcium 8.9 - 10.3 mg/dL 8.7(L) 8.7(L) 8.4(L)  Total Protein 6.5 - 8.1 g/dL - - -  Total Bilirubin 0.3 - 1.2 mg/dL - - -  Alkaline Phos 38 - 126 U/L - - -   AST 15 - 41 U/L - - -  ALT 0 - 44 U/L - - -    Renal function CrCl cannot be calculated (Patient's most recent lab result is older than the maximum 21 days allowed.).  Hgb A1c MFr Bld (%)  Date Value  03/25/2021 5.9 (H)    LDL Chol Calc (NIH)  Date Value Ref Range Status  06/03/2020 100 (H) 0 - 99 mg/dL Final   LDL Cholesterol  Date Value Ref Range Status  03/25/2021 75 0 - 99 mg/dL Final    Comment:           Total Cholesterol/HDL:CHD Risk Coronary Heart Disease Risk Table                     Men   Women  1/2 Average Risk   3.4   3.3  Average Risk       5.0   4.4  2 X Average Risk   9.6   7.1  3 X Average Risk  23.4   11.0        Use the calculated Patient Ratio above and the CHD Risk Table to determine the patient's CHD Risk.        ATP III CLASSIFICATION (LDL):  <100     mg/dL   Optimal  100-129  mg/dL   Near or Above                    Optimal  130-159  mg/dL   Borderline  160-189  mg/dL   High  >190     mg/dL   Very High Performed at [facility] [address], [place], [place] [address]     Carotid Arterial Duplex Study   Patient Name:  ALAZAR CHERIAN  Date of Exam:   05/04/2021  Medical Rec #: [id number]      Accession #:    [id number]  Date of Birth: Mar 29, 1929     Patient Gender: M  Patient Age:   43 years  Exam Location:  [facility]  Procedure:      VAS US CAROTID  Referring Phys: Jamelle Haring    ---------------------------------------------------------------------------  -----     Indications:       CVA and Carotid artery disease.  Comparison Study:  CTA 03/24/21: Right: proximal occlusion of the ICA with  distal                     reconstitution. Left: 75% proximal ICA stenosis.   Performing Technologist: Ralene Cork RVT      Examination Guidelines: A complete evaluation includes B-mode imaging,  spectral  Doppler, color Doppler, and power Doppler as needed of all accessible  portions  of each vessel.  Bilateral testing is considered an integral part of a  complete  examination. Limited examinations for reoccurring indications may be  performed  as noted.  Right Carotid Findings:  +----------+--------+--------+--------+------------------+--------------+            PSV cm/sEDV cm/sStenosisPlaque DescriptionComments        +----------+--------+--------+--------+------------------+--------------+  CCA Prox  112     8                                                 +----------+--------+--------+--------+------------------+--------------+  CCA Mid   88      11                                                +----------+--------+--------+--------+------------------+--------------+  CCA Distal63      5                                                 +----------+--------+--------+--------+------------------+--------------+  ICA Prox                  Occluded                                  +----------+--------+--------+--------+------------------+--------------+  ICA Mid   81      19                                                +----------+--------+--------+--------+------------------+--------------+  ICA Distal                                          Not visualized  +----------+--------+--------+--------+------------------+--------------+  ECA       157     14                                                +----------+--------+--------+--------+------------------+--------------+   +----------+--------+-------+----------------+-------------------+            PSV cm/sEDV cmsDescribe        Arm Pressure (mmHG)  +----------+--------+-------+----------------+-------------------+  [id number]             Multiphasic, WNL                     +----------+--------+-------+----------------+-------------------+   +---------+--------+--+--------+-+---------+  VertebralPSV cm/s36EDV cm/s8Antegrade   +---------+--------+--+--------+-+---------+       Left Carotid Findings:  +----------+--------+--------+--------+------------------+--------+            PSV cm/sEDV cm/sStenosisPlaque DescriptionComments  +----------+--------+--------+--------+------------------+--------+  CCA Prox  117     22                                          +----------+--------+--------+--------+------------------+--------+  CCA Mid   111     23                                          +----------+--------+--------+--------+------------------+--------+  CCA Distal60      18                                          +----------+--------+--------+--------+------------------+--------+  ICA Prox  360     130     80-99%  homogeneous                 +----------+--------+--------+--------+------------------+--------+  ICA Mid   180     58                                          +----------+--------+--------+--------+------------------+--------+  ICA Distal100     34                                          +----------+--------+--------+--------+------------------+--------+  ECA       154     0                                           +----------+--------+--------+--------+------------------+--------+   +----------+--------+--------+----------------+-------------------+            PSV cm/sEDV cm/sDescribe        Arm Pressure (mmHG)  +----------+--------+--------+----------------+-------------------+  [id number]             Multiphasic, WNL                     +----------+--------+--------+----------------+-------------------+   +---------+--------+--+--------+-+---------+  VertebralPSV cm/s35EDV cm/s9Antegrade  +---------+--------+--+--------+-+---------+      Summary:  Right Carotid: Evidence consistent with a total occlusion of the right  ICA. Mid                 ICA reconstitution.   Left Carotid: Velocities in the left ICA  are consistent with a 80-99%  stenosis.   Vertebrals:  Bilateral vertebral arteries demonstrate antegrade flow.  Subclavians: Normal flow hemodynamics were seen in bilateral subclavian               arteries.   *See table(s) above for measurements and observations.   Yevonne Aline. Stanford Breed, MD [facility] Phone Number: [phone] 05/04/2021 5:03 PM  Total time spent on preparing this encounter including chart review, data review, collecting history, examining the patient, coordinating care for this established patient, 30 minutes.  Portions of this report may have been transcribed using voice recognition software.  Every effort has been made to ensure accuracy; however, inadvertent computerized transcription errors may still be present.

## 2021-05-04 ENCOUNTER — Encounter: Payer: Self-pay | Admitting: Vascular Surgery

## 2021-05-04 ENCOUNTER — Ambulatory Visit (INDEPENDENT_AMBULATORY_CARE_PROVIDER_SITE_OTHER): Payer: Medicare Other | Admitting: Vascular Surgery

## 2021-05-04 ENCOUNTER — Ambulatory Visit (HOSPITAL_COMMUNITY)
Admission: RE | Admit: 2021-05-04 | Discharge: 2021-05-04 | Disposition: A | Discharge status: Home / Self Care | Payer: Medicare Other | Source: Ambulatory Visit | Attending: Vascular Surgery | Admitting: Vascular Surgery

## 2021-05-04 ENCOUNTER — Other Ambulatory Visit: Payer: Self-pay

## 2021-05-04 VITALS — BP 106/65 | HR 70 | Temp 98.7°F | Resp 20 | Ht 70.0 in | Wt 229.0 lb

## 2021-05-04 DIAGNOSIS — I6523 Occlusion and stenosis of bilateral carotid arteries: Secondary | ICD-10-CM

## 2021-05-04 DIAGNOSIS — I6521 Occlusion and stenosis of right carotid artery: Secondary | ICD-10-CM | POA: Insufficient documentation

## 2021-05-05 ENCOUNTER — Other Ambulatory Visit: Payer: Self-pay

## 2021-05-05 DIAGNOSIS — I6523 Occlusion and stenosis of bilateral carotid arteries: Secondary | ICD-10-CM

## 2021-06-16 ENCOUNTER — Encounter (HOSPITAL_BASED_OUTPATIENT_CLINIC_OR_DEPARTMENT_OTHER): Payer: Self-pay

## 2021-06-16 ENCOUNTER — Emergency Department (HOSPITAL_BASED_OUTPATIENT_CLINIC_OR_DEPARTMENT_OTHER)
Admission: EM | Admit: 2021-06-16 | Discharge: 2021-06-16 | Disposition: A | Discharge status: Home / Self Care | Payer: Medicare Other | Attending: Emergency Medicine | Admitting: Emergency Medicine

## 2021-06-16 ENCOUNTER — Other Ambulatory Visit: Payer: Self-pay

## 2021-06-16 DIAGNOSIS — Z79899 Other long term (current) drug therapy: Secondary | ICD-10-CM | POA: Insufficient documentation

## 2021-06-16 DIAGNOSIS — I2510 Atherosclerotic heart disease of native coronary artery without angina pectoris: Secondary | ICD-10-CM | POA: Insufficient documentation

## 2021-06-16 DIAGNOSIS — Z96642 Presence of left artificial hip joint: Secondary | ICD-10-CM | POA: Diagnosis not present

## 2021-06-16 DIAGNOSIS — Z8616 Personal history of COVID-19: Secondary | ICD-10-CM | POA: Insufficient documentation

## 2021-06-16 DIAGNOSIS — I10 Essential (primary) hypertension: Secondary | ICD-10-CM | POA: Insufficient documentation

## 2021-06-16 DIAGNOSIS — Z7982 Long term (current) use of aspirin: Secondary | ICD-10-CM | POA: Diagnosis not present

## 2021-06-16 DIAGNOSIS — E875 Hyperkalemia: Secondary | ICD-10-CM | POA: Diagnosis not present

## 2021-06-16 DIAGNOSIS — Z955 Presence of coronary angioplasty implant and graft: Secondary | ICD-10-CM | POA: Diagnosis not present

## 2021-06-16 DIAGNOSIS — R531 Weakness: Secondary | ICD-10-CM | POA: Diagnosis present

## 2021-06-16 LAB — CBC
HCT: 37 % — ABNORMAL LOW (ref 39.0–52.0)
Hemoglobin: 12 g/dL — ABNORMAL LOW (ref 13.0–17.0)
MCH: 31 pg (ref 26.0–34.0)
MCHC: 32.4 g/dL (ref 30.0–36.0)
MCV: 95.6 fL (ref 80.0–100.0)
Platelets: 136 10*3/uL — ABNORMAL LOW (ref 150–400)
RBC: 3.87 MIL/uL — ABNORMAL LOW (ref 4.22–5.81)
RDW: 15 % (ref 11.5–15.5)
WBC: 7.8 10*3/uL (ref 4.0–10.5)
nRBC: 0 % (ref 0.0–0.2)

## 2021-06-16 LAB — BASIC METABOLIC PANEL
Anion gap: 9 (ref 5–15)
BUN: 27 mg/dL — ABNORMAL HIGH (ref 8–23)
CO2: 22 mmol/L (ref 22–32)
Calcium: 9.2 mg/dL (ref 8.9–10.3)
Chloride: 104 mmol/L (ref 98–111)
Creatinine, Ser: 1.43 mg/dL — ABNORMAL HIGH (ref 0.61–1.24)
GFR, Estimated: 47 mL/min — ABNORMAL LOW (ref >60)
Glucose, Bld: 95 mg/dL (ref 70–99)
Potassium: 6 mmol/L — ABNORMAL HIGH (ref 3.5–5.1)
Sodium: 135 mmol/L (ref 135–145)

## 2021-06-16 LAB — URINALYSIS, ROUTINE W REFLEX MICROSCOPIC
Bilirubin Urine: NEGATIVE
Glucose, UA: NEGATIVE mg/dL
Ketones, ur: NEGATIVE mg/dL
Nitrite: NEGATIVE
Protein, ur: NEGATIVE mg/dL
Specific Gravity, Urine: 1.009 (ref 1.005–1.030)
pH: 6 (ref 5.0–8.0)

## 2021-06-16 LAB — CBG MONITORING, ED: Glucose-Capillary: 102 mg/dL — ABNORMAL HIGH (ref 70–99)

## 2021-06-16 LAB — POTASSIUM: Potassium: 3.8 mmol/L (ref 3.5–5.1)

## 2021-06-16 MED ORDER — SODIUM CHLORIDE 0.9 % IV BOLUS
500.0000 mL | Freq: Once | INTRAVENOUS | Status: AC
  Administered 2021-06-16: 500 mL via INTRAVENOUS

## 2021-06-16 MED ORDER — SODIUM ZIRCONIUM CYCLOSILICATE 10 G PO PACK
10.0000 g | PACK | Freq: Once | ORAL | Status: AC
  Administered 2021-06-16: 10 g via ORAL
  Filled 2021-06-16: qty 1

## 2021-06-16 NOTE — ED Triage Notes (Signed)
Pt sent here by [facility] and PT for concerns of dehydration and low BP. Spouse at bedside is unsure what test they ran at the house to come to this conclusion. Spouse and pt both deny noticing any areas of concern. Pt is at his baseline, A/O to self and location. Per spouse she is unsure if he is normally altered to time. Pt has not taken his BP meds per instructions of [place] 88/50 and 102/66

## 2021-06-16 NOTE — ED Notes (Signed)
RN provided AVS using Teachback Method. Patient verbalizes understanding of Discharge Instructions. Opportunity for Questioning and Answers were provided by RN. Patient Discharged from ED ambulatory to home via Family.

## 2021-06-16 NOTE — ED Provider Notes (Signed)
[facility] EMERGENCY DEPT Provider Note   CSN: [id number] Arrival date & time: 06/16/21  1357     History Chief Complaint  Patient presents with   Weakness    Tyler [name] is a 87 y.o. male.  Patient presents from home chief complaint of generalized weakness.  He has a home health nurse that comes and checks on him.  Patient's wife states that the nurse felt he was dehydrated and advised him to go to the ER for evaluation.  Patient himself denies any specific pain or discomfort.  No reports of headache or chest pain or abdominal pain no reports of fall.  No reports of fevers cough vomiting or diarrhea.      Past Medical History:  Diagnosis Date   AAA (abdominal aortic aneurysm)    a. 05/2014 s/p stent grafting [facility]).   Bronchitis    CAD (coronary artery disease)    a. 06/2013 VF Arrest/Cath: 3VD, EF 50%;  b. 06/2013 CABG x 4: LIMA->LAD, VG->PDA->RPL, VG->OM.   CVA (cerebral infarction)    Erectile dysfunction    Essential hypertension    GERD (gastroesophageal reflux disease)    H/O hiatal hernia    HOH (hard of hearing)    wears hearing aid in right ear   Hyperlipidemia    Osteoarthritis    Pneumonia    YRS AGO   Stroke [facility])     Patient Active Problem List   Diagnosis Date Noted   COVID-19 03/25/2021   Physical deconditioning 03/25/2021   Transient ischemic attack (TIA) 03/25/2021   Transient ischemic attack 03/24/2021   Stenosis of right carotid artery    Abdominal distension 09/03/2014   Abdominal pain 09/03/2014   Arthritis of left hip 09/01/2014   AAA (abdominal aortic aneurysm)    Essential hypertension    CAD (coronary artery disease)    Cellulitis 07/08/2013   S/P CABG x 4 06/27/2013   Hyperlipidemia     Past Surgical History:  Procedure Laterality Date   CORONARY ARTERY BYPASS GRAFT N/A 06/27/2013   Procedure: CORONARY ARTERY BYPASS GRAFTING (CABG);  Surgeon: Ivin Poot, MD;  Location: [place];  Service: Open Heart  Surgery;  Laterality: N/A;  times four utilizing the left internal mammary artery and the right greater saphenous vein harvested endoscopically   ENDOVEIN HARVEST OF GREATER SAPHENOUS VEIN Right 06/27/2013   Procedure: ENDOVEIN HARVEST OF GREATER SAPHENOUS VEIN;  Surgeon: Ivin Poot, MD;  Location: [place];  Service: Open Heart Surgery;  Laterality: Right;   EYE SURGERY     bilateral cataracts   INNER EAR SURGERY Left 1982   INNER EAR SURGERY     severed a nerve in his left ear.    LEFT HEART CATHETERIZATION WITH CORONARY ANGIOGRAM N/A 06/27/2013   Procedure: LEFT HEART CATHETERIZATION WITH CORONARY ANGIOGRAM;  Surgeon: Sinclair Grooms, MD;  Location: [facility] CATH LAB;  Service: Cardiovascular;  Laterality: N/A;  iabp insertion, cpr, intubation, emergent cvts   TEE WITHOUT CARDIOVERSION N/A 06/27/2013   Procedure: TRANSESOPHAGEAL ECHOCARDIOGRAM (TEE);  Surgeon: Ivin Poot, MD;  Location: [place];  Service: Open Heart Surgery;  Laterality: N/A;   TONSILLECTOMY     TOTAL HIP ARTHROPLASTY Left 09/01/2014   Procedure: TOTAL HIP ARTHROPLASTY;  Surgeon: Kerin Salen, MD;  Location: [place];  Service: Orthopedics;  Laterality: Left;       Family History  Problem Relation Age of Onset   Heart disease Sister    Heart disease Brother  Heart attack Sister    Heart attack Brother    Colon cancer Neg Hx    Hypertension Neg Hx    Stroke Neg Hx     Social History   Tobacco Use   Smoking status: Never   Smokeless tobacco: Never  Vaping Use   Vaping Use: Never used  Substance Use Topics   Alcohol use: Yes    Comment: Rare   Drug use: No    Home Medications Prior to Admission medications   Medication Sig Start Date End Date Taking? Authorizing Provider  aspirin EC 81 MG tablet Take 1 tablet (81 mg total) by mouth daily. Swallow whole. Patient taking differently: Take 81 mg by mouth at bedtime. Swallow whole. 06/03/20   Burtis Junes, NP  clopidogrel (PLAVIX) 75 MG tablet Take 1 tablet  (75 mg total) by mouth daily. 03/30/21 06/28/21  Sanjuan Dame, MD  dextromethorphan (DELSYM) 30 MG/5ML liquid Take 2.5 mLs (15 mg total) by mouth 2 (two) times daily. 03/30/21   Sanjuan Dame, MD  furosemide (LASIX) 20 MG tablet Take 1 tablet (20 mg total) by mouth as needed for fluid or edema. Take one tablet daily for one week and then only as needed. 03/30/21 04/29/21  Sanjuan Dame, MD  lisinopril (ZESTRIL) 10 MG tablet Take 5 mg by mouth daily.    [provider]  metoprolol tartrate (LOPRESSOR) 25 MG tablet Take 0.5 tablets (12.5 mg total) by mouth 2 (two) times daily. 08/29/13   Belva Crome, MD  naproxen sodium (ALEVE) 220 MG tablet Take 220-440 mg by mouth 2 (two) times daily as needed (for back pain).    [provider]  nitroGLYCERIN (NITROSTAT) 0.4 MG SL tablet Place 1 tablet (0.4 mg total) under the tongue every 5 (five) minutes as needed for chest pain. 08/07/19   Belva Crome, MD  rosuvastatin (CRESTOR) 20 MG tablet Take 1 tablet (20 mg total) by mouth daily. 03/30/21 06/28/21  Sanjuan Dame, MD  terazosin (HYTRIN) 5 MG capsule Take 5 mg by mouth at bedtime.    [provider]    Allergies    Atorvastatin  Review of Systems   Review of Systems  Constitutional:  Negative for fever.  HENT:  Negative for ear pain and sore throat.   Eyes:  Negative for pain.  Respiratory:  Negative for cough.   Cardiovascular:  Negative for chest pain.  Gastrointestinal:  Negative for abdominal pain.  Genitourinary:  Negative for flank pain.  Musculoskeletal:  Negative for back pain.  Skin:  Negative for color change and rash.  Neurological:  Negative for syncope.  All other systems reviewed and are negative.  Physical Exam Updated Vital Signs BP 118/71   Pulse 77   Temp 98.3 F (36.8 C)   Resp 17   Ht 5\' 10"  (1.778 m)   Wt 103.9 kg   SpO2 95%   BMI 32.87 kg/m   Physical Exam Constitutional:      Appearance: He is well-developed.  HENT:      Head: Normocephalic.     Nose: Nose normal.  Eyes:     Extraocular Movements: Extraocular movements intact.  Cardiovascular:     Rate and Rhythm: Normal rate.  Pulmonary:     Effort: Pulmonary effort is normal.  Skin:    Coloration: Skin is not jaundiced.  Neurological:     General: No focal deficit present.     Mental Status: He is alert and oriented to person, place, and  time. Mental status is at baseline.     Cranial Nerves: No cranial nerve deficit.     Motor: No weakness.    ED Results / Procedures / Treatments   Labs (all labs ordered are listed, but only abnormal results are displayed) Labs Reviewed  BASIC METABOLIC PANEL - Abnormal; Notable for the following components:      Result Value   Potassium 6.0 (*)    BUN 27 (*)    Creatinine, Ser 1.43 (*)    GFR, Estimated 47 (*)    All other components within normal limits  CBC - Abnormal; Notable for the following components:   RBC 3.87 (*)    Hemoglobin 12.0 (*)    HCT 37.0 (*)    Platelets 136 (*)    All other components within normal limits  URINALYSIS, ROUTINE W REFLEX MICROSCOPIC - Abnormal; Notable for the following components:   Hgb urine dipstick MODERATE (*)    Leukocytes,Ua SMALL (*)    All other components within normal limits  CBG MONITORING, ED - Abnormal; Notable for the following components:   Glucose-Capillary 102 (*)    All other components within normal limits  POTASSIUM    EKG EKG Interpretation  Date/Time:  Wednesday June 16 2021 14:25:39 EST Ventricular Rate:  73 PR Interval:  186 QRS Duration: 102 QT Interval:  381 QTC Calculation: 420 R Axis:   68 Text Interpretation: Sinus rhythm Low voltage, precordial leads Nonspecific T abnormalities, diffuse leads Confirmed by Thamas Jaegers ([id number]) on 06/16/2021 6:00:23 PM  Radiology No results found.  Procedures Procedures   Medications Ordered in ED Medications  sodium chloride 0.9 % bolus 500 mL (0 mLs Intravenous Stopped 06/16/21  1642)  sodium zirconium cyclosilicate (LOKELMA) packet 10 g (10 g Oral Given 06/16/21 1537)    ED Course  I have reviewed the triage vital signs and the nursing notes.  Pertinent labs & imaging results that were available during my care of the patient were reviewed by me and considered in my medical decision making (see chart for details).    MDM Rules/Calculators/A&P                           EKG shows sinus rhythm no ST elevations or depressions no T wave inversions noted.  Labs are sent potassium elevated 6.0.  Patient given Kayexalate, several hours later potassium appears improved to 3.8.  Patient remains asymptomatic with no complaints of pain or discomfort or new weakness.  Discharged home, advised outpatient follow-up with his doctor within the week for repeat lab testing.  Advised immediate return for worsening symptoms fevers pain or any additional concerns.  Final Clinical Impression(s) / ED Diagnoses Final diagnoses:  Hyperkalemia    Rx / DC Orders ED Discharge Orders     None        Luna Fuse, MD 06/16/21 [phone]

## 2021-06-16 NOTE — Discharge Instructions (Addendum)
Your repeat labs appear improved. Call your primary care doctor or specialist as discussed in the next 2-3 days.   Return immediately back to the ER if:  Your symptoms worsen within the next 12-24 hours. You develop new symptoms such as new fevers, persistent vomiting, new pain, shortness of breath, or new weakness or numbness, or if you have any other concerns.

## 2021-10-08 ENCOUNTER — Telehealth: Payer: Self-pay | Admitting: *Deleted

## 2021-10-08 NOTE — Telephone Encounter (Signed)
Patient's daughter called requesting name of Dad's vascular surgeon.  She states that patient is awaiting a referral here but they're communicating with the [facility] by email.  I checked our referrals (faxed) and did not find one.  Daughter will continue to contact the [place]. She will be calling [facility] back the first of next week(10/11/2021) to confirm receipt. ?

## 2021-10-26 NOTE — Progress Notes (Signed)
?Cardiology Office Note:   ? ?Date:  10/27/2021  ? ?ID:  Tyler [name], DOB [birth year], MRN [id number] ? ?PCP:  [name], MD  ?Cardiologist:  Sinclair Grooms, MD  ? ?Referring MD: [name], MD  ? ?No chief complaint on file. ? ? ?History of Present Illness:   ? ?Tyler [name] is a 88 y.o. male with a hx of CAD status post CABG 06/2013 for V. fib arrest and ostial left main stenosis with a LIMA to the LAD, SVG to the PDA/PL 8, SVG to the OM, diastolic heart failure with EF was 50%.,  Primary hypertension, hyperlipidemia, prior stroke, bilateral lower extremity edema, and stent grafting of his AAA and [facility] 05/2014.  ? ?He feels weak.  Recently has been found to have a bladder mass.  Has been having some hematuria. ? ?Today on check-in and also the last office visit blood pressure was noted to be low.  He feels weak.  He is in a wheelchair most of the time.  Does not do much walking.  Has some lower extremity swelling.  Medications that he takes on a regular basis that can impact blood pressure or Zestril 5 mg/day Hytrin 5 mg at bedtime and metoprolol 12.5 mg twice daily. ? ?He denies orthopnea, PND, significant change in lower extremity edema, and leg pain. ? ?Past Medical History:  ?Diagnosis Date  ? AAA (abdominal aortic aneurysm) ([place])   ? a. 05/2014 s/p stent grafting [facility]).  ? Bronchitis   ? CAD (coronary artery disease)   ? a. 06/2013 VF Arrest/Cath: 3VD, EF 50%;  b. 06/2013 CABG x 4: LIMA->LAD, VG->PDA->RPL, VG->OM.  ? CVA (cerebral infarction)   ? Erectile dysfunction   ? Essential hypertension   ? GERD (gastroesophageal reflux disease)   ? H/O hiatal hernia   ? HOH (hard of hearing)   ? wears hearing aid in right ear  ? Hyperlipidemia   ? Osteoarthritis   ? Pneumonia   ? YRS AGO  ? Stroke [facility])   ? ? ?Past Surgical History:  ?Procedure Laterality Date  ? CORONARY ARTERY BYPASS GRAFT N/A 06/27/2013  ? Procedure: CORONARY ARTERY BYPASS GRAFTING (CABG);  Surgeon: Ivin Poot, MD;   Location: [place];  Service: Open Heart Surgery;  Laterality: N/A;  times four utilizing the left internal mammary artery and the right greater saphenous vein harvested endoscopically  ? ENDOVEIN HARVEST OF GREATER SAPHENOUS VEIN Right 06/27/2013  ? Procedure: ENDOVEIN HARVEST OF GREATER SAPHENOUS VEIN;  Surgeon: Ivin Poot, MD;  Location: [place];  Service: Open Heart Surgery;  Laterality: Right;  ? EYE SURGERY    ? bilateral cataracts  ? INNER EAR SURGERY Left 1982  ? INNER EAR SURGERY    ? severed a nerve in his left ear.   ? LEFT HEART CATHETERIZATION WITH CORONARY ANGIOGRAM N/A 06/27/2013  ? Procedure: LEFT HEART CATHETERIZATION WITH CORONARY ANGIOGRAM;  Surgeon: Sinclair Grooms, MD;  [facility] CATH LAB;  Service: Cardiovascular;  Laterality: N/A;  iabp insertion, cpr, intubation, emergent cvts  ? TEE WITHOUT CARDIOVERSION N/A 06/27/2013  ? Procedure: TRANSESOPHAGEAL ECHOCARDIOGRAM (TEE);  Surgeon: Ivin Poot, MD;  Location: [place];  Service: Open Heart Surgery;  Laterality: N/A;  ? TONSILLECTOMY    ? TOTAL HIP ARTHROPLASTY Left 09/01/2014  ? Procedure: TOTAL HIP ARTHROPLASTY;  Surgeon: Kerin Salen, MD;  Location: [place];  Service: Orthopedics;  Laterality: Left;  ? ? ?Current Medications: ?Current Meds  ?Medication Sig  ?  aspirin EC 81 MG tablet Take 1 tablet (81 mg total) by mouth daily. Swallow whole. (Patient taking differently: Take 81 mg by mouth at bedtime. Swallow whole.)  ? atorvastatin (LIPITOR) 10 MG tablet Take 10 mg by mouth daily.  ? dextromethorphan (DELSYM) 30 MG/5ML liquid Take 2.5 mLs (15 mg total) by mouth 2 (two) times daily.  ? lisinopril (ZESTRIL) 10 MG tablet Take 5 mg by mouth daily.  ? metoprolol tartrate (LOPRESSOR) 25 MG tablet Take 0.5 tablets (12.5 mg total) by mouth 2 (two) times daily.  ? naproxen sodium (ALEVE) 220 MG tablet Take 220-440 mg by mouth 2 (two) times daily as needed (for back pain).  ? nitroGLYCERIN (NITROSTAT) 0.4 MG SL tablet Place 1 tablet (0.4 mg total)  under the tongue every 5 (five) minutes as needed for chest pain.  ? terazosin (HYTRIN) 5 MG capsule Take 5 mg by mouth at bedtime.  ?  ? ?Allergies:   Atorvastatin  ? ?Social History  ? ?Socioeconomic History  ? Marital status: Married  ?  Spouse name: Not on file  ? Number of children: Not on file  ? Years of education: Not on file  ? Highest education level: Not on file  ?Occupational History  ? Not on file  ?Tobacco Use  ? Smoking status: Never  ? Smokeless tobacco: Never  ?Vaping Use  ? Vaping Use: Never used  ?Substance and Sexual Activity  ? Alcohol use: Yes  ?  Comment: Rare  ? Drug use: No  ? Sexual activity: Not on file  ?Other Topics Concern  ? Not on file  ?Social History Narrative  ? Not on file  ? ?Social Determinants of Health  ? ?Financial Resource Strain: Not on file  ?Food Insecurity: Not on file  ?Transportation Needs: Not on file  ?Physical Activity: Not on file  ?Stress: Not on file  ?Social Connections: Not on file  ?  ? ?Family History: ?The patient's family history includes Heart attack in his brother and sister; Heart disease in his brother and sister. There is no history of Colon cancer, Hypertension, or Stroke. ? ?ROS:   ?Please see the history of present illness.    ?Very weak.  Unable to ambulate freely.  Sleeps okay.  Frequent urination with some burning since her recent urological procedure.  Some hematuria is present.  All other systems reviewed and are negative. ? ?EKGs/Labs/Other Studies Reviewed:   ? ?The following studies were reviewed today: ? ?2D Doppler echocardiogram 03/25/2021: ?IMPRESSIONS  ? ? ? 1. Left ventricular ejection fraction, by estimation, is 60 to 65%. The  ?left ventricle has normal function. The left ventricle has no regional  ?wall motion abnormalities. Left ventricular diastolic parameters are  ?consistent with Grade I diastolic  ?dysfunction (impaired relaxation).  ? 2. Right ventricular systolic function is normal. The right ventricular  ?size is normal.  ? 3.  The mitral valve is normal in structure. No evidence of mitral valve  ?regurgitation. No evidence of mitral stenosis.  ? 4. The aortic valve is calcified. There is moderate calcification of the  ?aortic valve. There is moderate thickening of the aortic valve. Aortic  ?valve regurgitation is not visualized. Mild to moderate aortic valve  ?sclerosis/calcification is present,  ?without any evidence of aortic stenosis.  ? 5. The inferior vena cava is normal in size with greater than 50%  ?respiratory variability, suggesting right atrial pressure of 3 mmHg.  ? ?Conclusion(s)/Recommendation(s): No intracardiac source of embolism  ?detected on this  transthoracic study. A transesophageal echocardiogram is  ?recommended to exclude cardiac source of embolism if clinically indicated.  ? ? ? ?EKG:  EKG not given repeated ? ?Recent Labs: ?03/24/2021: ALT 10 ?06/16/2021: BUN 27; Creatinine, Ser 1.43; Hemoglobin 12.0; Platelets 136; Potassium 3.8; Sodium 135  ?Recent Lipid Panel ?   ?Component Value Date/Time  ? CHOL 126 03/25/2021 0209  ? CHOL 160 06/03/2020 1603  ? TRIG 127 03/25/2021 0209  ? HDL 26 (L) 03/25/2021 0209  ? HDL 35 (L) 06/03/2020 1603  ? CHOLHDL 4.8 03/25/2021 0209  ? VLDL 25 03/25/2021 0209  ? Burns 75 03/25/2021 0209  ? LDLCALC 100 (H) 06/03/2020 1603  ? ? ?Physical Exam:   ? ?VS:  BP (!) 87/51   Pulse 75   Ht '5\' 10"'$  (1.778 m)   Wt 230 lb 12.8 oz (104.7 kg)   SpO2 96%   BMI 33.12 kg/m?    ? ?Wt Readings from Last 3 Encounters:  ?10/27/21 230 lb 12.8 oz (104.7 kg)  ?06/16/21 229 lb 0.9 oz (103.9 kg)  ?05/04/21 229 lb (103.9 kg)  ?  ? ?GEN: Obese. No acute distress ?HEENT: Normal ?NECK: No JVD. ?LYMPHATICS: No lymphadenopathy ?CARDIAC: No murmur. RRR no gallop, or edema. ?VASCULAR:  Normal Pulses. No bruits. ?RESPIRATORY:  Clear to auscultation without rales, wheezing or rhonchi  ?ABDOMEN: Soft, non-tender, non-distended, No pulsatile mass, ?MUSCULOSKELETAL: No deformity  ?SKIN: Warm and dry ?NEUROLOGIC:   Alert and oriented x 3 ?PSYCHIATRIC:  Normal affect  ? ?ASSESSMENT:   ? ?1. Coronary artery disease involving native coronary artery of native heart without angina pectoris   ?2. Mixed hyperlipidemia   ?3. [place]

## 2021-10-27 ENCOUNTER — Encounter: Payer: Self-pay | Admitting: Interventional Cardiology

## 2021-10-27 ENCOUNTER — Ambulatory Visit (INDEPENDENT_AMBULATORY_CARE_PROVIDER_SITE_OTHER): Payer: No Typology Code available for payment source | Admitting: Interventional Cardiology

## 2021-10-27 VITALS — BP 87/51 | HR 75 | Ht 70.0 in | Wt 230.8 lb

## 2021-10-27 DIAGNOSIS — I10 Essential (primary) hypertension: Secondary | ICD-10-CM | POA: Diagnosis not present

## 2021-10-27 DIAGNOSIS — E782 Mixed hyperlipidemia: Secondary | ICD-10-CM

## 2021-10-27 DIAGNOSIS — I7143 Infrarenal abdominal aortic aneurysm, without rupture: Secondary | ICD-10-CM

## 2021-10-27 DIAGNOSIS — I6521 Occlusion and stenosis of right carotid artery: Secondary | ICD-10-CM

## 2021-10-27 DIAGNOSIS — I2510 Atherosclerotic heart disease of native coronary artery without angina pectoris: Secondary | ICD-10-CM | POA: Diagnosis not present

## 2021-10-27 NOTE — Patient Instructions (Signed)
Medication Instructions:  ?1) DISCONTINUE Lisinopril ? ?*If you need a refill on your cardiac medications before your next appointment, please call your pharmacy* ? ? ?Lab Work: ?None ?If you have labs (blood work) drawn today and your tests are completely normal, you will receive your results only by: ?MyChart Message (if you have MyChart) OR ?A paper copy in the mail ?If you have any lab test that is abnormal or we need to change your treatment, we will call you to review the results. ? ? ?Testing/Procedures: ?None ? ? ?Follow-Up: ?At [facility], you and your health needs are our priority.  As part of our continuing mission to provide you with exceptional heart care, we have created designated Provider Care Teams.  These Care Teams include your primary Cardiologist (physician) and Advanced Practice Providers (APPs -  Physician Assistants and Nurse Practitioners) who all work together to provide you with the care you need, when you need it. ? ?We recommend signing up for the patient portal called "MyChart".  Sign up information is provided on this After Visit Summary.  MyChart is used to connect with patients for Virtual Visits (Telemedicine).  Patients are able to view lab/test results, encounter notes, upcoming appointments, etc.  Non-urgent messages can be sent to your provider as well.   ?To learn more about what you can do with MyChart, go to [email or web address].   ? ?Your next appointment:   ?1 year(s) ? ?The format for your next appointment:   ?In Person ? ?Provider:   ?Sinclair Grooms, MD  ? ? ?Other Instructions ?  ?

## 2021-10-28 NOTE — Telephone Encounter (Signed)
He needs to stay on the terazosin to help with urine flow. ?Okay to send a prescription for sublingual nitroglycerin. ?Continue with plan to discontinue ACE inhibitor therapy. 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  It is appropriate to have the vascular clinic consultation and management as needed to avoid rupture. ?

## 2021-10-29 ENCOUNTER — Other Ambulatory Visit: Payer: Self-pay | Admitting: *Deleted

## 2021-10-29 DIAGNOSIS — I25118 Atherosclerotic heart disease of native coronary artery with other forms of angina pectoris: Secondary | ICD-10-CM

## 2021-10-29 MED ORDER — NITROGLYCERIN 0.4 MG SL SUBL
0.4000 mg | SUBLINGUAL_TABLET | SUBLINGUAL | 3 refills | Status: AC | PRN
Start: 2021-10-29 — End: ?

## 2021-11-01 ENCOUNTER — Other Ambulatory Visit: Payer: Self-pay | Admitting: *Deleted

## 2021-11-01 DIAGNOSIS — I6523 Occlusion and stenosis of bilateral carotid arteries: Secondary | ICD-10-CM

## 2021-11-03 ENCOUNTER — Encounter (HOSPITAL_COMMUNITY): Payer: Medicare Other

## 2021-11-05 ENCOUNTER — Ambulatory Visit (HOSPITAL_COMMUNITY)
Admission: RE | Admit: 2021-11-05 | Discharge: 2021-11-05 | Disposition: A | Discharge status: Home / Self Care | Payer: No Typology Code available for payment source | Source: Ambulatory Visit | Attending: Vascular Surgery | Admitting: Vascular Surgery

## 2021-11-05 DIAGNOSIS — I6523 Occlusion and stenosis of bilateral carotid arteries: Secondary | ICD-10-CM | POA: Diagnosis present

## 2021-11-09 ENCOUNTER — Encounter: Payer: Self-pay | Admitting: Vascular Surgery

## 2021-11-09 ENCOUNTER — Ambulatory Visit (INDEPENDENT_AMBULATORY_CARE_PROVIDER_SITE_OTHER): Payer: No Typology Code available for payment source | Admitting: Vascular Surgery

## 2021-11-09 VITALS — BP 83/50 | HR 62 | Temp 98.4°F | Resp 20 | Ht 70.0 in | Wt 230.0 lb

## 2021-11-09 DIAGNOSIS — I6522 Occlusion and stenosis of left carotid artery: Secondary | ICD-10-CM

## 2021-11-09 DIAGNOSIS — I6521 Occlusion and stenosis of right carotid artery: Secondary | ICD-10-CM | POA: Diagnosis not present

## 2021-11-09 NOTE — Progress Notes (Signed)
[facility] [place] ? ?ASSESSMENT / PLAN: ?88 y.o. male with right carotid artery occlusion; asymptomatic, but severe left carotid artery stenosis. He is elderly and deconditioned. ?  ?The patient should continue best medical therapy for carotid artery stenosis including: ?Complete cessation from all tobacco products. ?Blood glucose control with goal A1c < 7%. ?Blood pressure control with goal blood pressure < 140/90 mmHg. ?Lipid reduction therapy with goal LDL-C <100 mg/dL (<70 if symptomatic from carotid artery stenosis).  ?Aspirin '81mg'$  PO QD.  ?Atorvastatin 40-'80mg'$  PO QD (or other "high intensity" statin therapy). ?  ?No role for revascularization for occluded right carotid artery.  Given his bladder cancer, advanced age, and deconditioning I do not think he is a candidate for prophylactic intervention on an asymptomatic carotid artery stenosis.  He has CT evidence of a endoleak.  I will see him again in 3 months with a CT angiogram of his abdomen and pelvis to better characterize this endoleak. ?  ?CHIEF COMPLAINT: Left-sided weakness ?  ?HISTORY OF PRESENT ILLNESS: ?Tyler [name] is a 88 y.o. male admitted to the hospital for left-sided weakness beginning yesterday.  The patient reports he was in his typical state of health yesterday when he noticed left-sided weakness, and facial drooping.  He reports some persistent weakness in his left upper extremity and left lower extremity.  His facial droop has resolved.  He has been deconditioned and has had hard time taking care of himself at home.  He lives independently with his wife. ?  ?05/04/21: Patient presents to clinic for follow-up after hospitalization.  He reports he is doing okay.  He is ambulating with a walker.  He is still living independently with his wife.  We reviewed his carotid duplex ultrasound findings.  We had a long discussion of risks, benefits, and alternatives to intervention. ? ?11/09/21: Returns for surveillance.   Having difficulty with hypotension, causing weakness and dizziness.  A bladder mass has been discovered, he is under going further care with Dr. Norma Fredrickson of [facility].  He has no focal symptoms.  No unilateral weakness, difficulty speaking, facial droop. ? ?VASCULAR SURGICAL HISTORY: EVAR 2015. ?  ?VASCULAR RISK FACTORS: ?Positive history of stroke / transient ischemic attack. ?Positive history of coronary artery disease. +history of CABG.  ?Negative history of diabetes mellitus. Last A1c 5.9. ?Negative history of smoking. Not actively smoking. ?Positive history of hypertension.  ?Positive history of chronic kidney disease.  Last GFR 44.  ?Negative history of chronic obstructive pulmonary disease. ?  ?FUNCTIONAL STATUS: ?ECOG performance status: (2) Ambulatory and capable of self care, unable to carry out work activity, up and about > 50% or waking hours ?Ambulatory status: Minimally ambulatory (e.g. about the home only) ? ?Past Medical History:  ?Diagnosis Date  ? AAA (abdominal aortic aneurysm) ([place])   ? a. 05/2014 s/p stent grafting [facility]).  ? Bronchitis   ? CAD (coronary artery disease)   ? a. 06/2013 VF Arrest/Cath: 3VD, EF 50%;  b. 06/2013 CABG x 4: LIMA->LAD, VG->PDA->RPL, VG->OM.  ? CVA (cerebral infarction)   ? Erectile dysfunction   ? Essential hypertension   ? GERD (gastroesophageal reflux disease)   ? H/O hiatal hernia   ? HOH (hard of hearing)   ? wears hearing aid in right ear  ? Hyperlipidemia   ? Osteoarthritis   ? Pneumonia   ? YRS AGO  ? Stroke [facility])   ? ? ?Past Surgical History:  ?Procedure Laterality Date  ? CORONARY ARTERY BYPASS  GRAFT N/A 06/27/2013  ? Procedure: CORONARY ARTERY BYPASS GRAFTING (CABG);  Surgeon: Ivin Poot, MD;  Location: [place];  Service: Open Heart Surgery;  Laterality: N/A;  times four utilizing the left internal mammary artery and the right greater saphenous vein harvested endoscopically  ? ENDOVEIN HARVEST OF GREATER SAPHENOUS VEIN Right 06/27/2013  ?  Procedure: ENDOVEIN HARVEST OF GREATER SAPHENOUS VEIN;  Surgeon: Ivin Poot, MD;  Location: [place];  Service: Open Heart Surgery;  Laterality: Right;  ? EYE SURGERY    ? bilateral cataracts  ? INNER EAR SURGERY Left 1982  ? INNER EAR SURGERY    ? severed a nerve in his left ear.   ? LEFT HEART CATHETERIZATION WITH CORONARY ANGIOGRAM N/A 06/27/2013  ? Procedure: LEFT HEART CATHETERIZATION WITH CORONARY ANGIOGRAM;  Surgeon: Sinclair Grooms, MD;  Location: [facility] CATH LAB;  Service: Cardiovascular;  Laterality: N/A;  iabp insertion, cpr, intubation, emergent cvts  ? TEE WITHOUT CARDIOVERSION N/A 06/27/2013  ? Procedure: TRANSESOPHAGEAL ECHOCARDIOGRAM (TEE);  Surgeon: Ivin Poot, MD;  Location: [place];  Service: Open Heart Surgery;  Laterality: N/A;  ? TONSILLECTOMY    ? TOTAL HIP ARTHROPLASTY Left 09/01/2014  ? Procedure: TOTAL HIP ARTHROPLASTY;  Surgeon: Kerin Salen, MD;  Location: [place];  Service: Orthopedics;  Laterality: Left;  ? ? ?Family History  ?Problem Relation Age of Onset  ? Heart disease Sister   ? Heart disease Brother   ? Heart attack Sister   ? Heart attack Brother   ? Colon cancer Neg Hx   ? Hypertension Neg Hx   ? Stroke Neg Hx   ? ? ?Social History  ? ?Socioeconomic History  ? Marital status: Married  ?  Spouse name: Not on file  ? Number of children: Not on file  ? Years of education: Not on file  ? Highest education level: Not on file  ?Occupational History  ? Not on file  ?Tobacco Use  ? Smoking status: Never  ? Smokeless tobacco: Never  ?Vaping Use  ? Vaping Use: Never used  ?Substance and Sexual Activity  ? Alcohol use: Yes  ?  Comment: Rare  ? Drug use: No  ? Sexual activity: Not on file  ?Other Topics Concern  ? Not on file  ?Social History Narrative  ? Not on file  ? ?Social Determinants of Health  ? ?Financial Resource Strain: Not on file  ?Food Insecurity: Not on file  ?Transportation Needs: Not on file  ?Physical Activity: Not on file  ?Stress: Not on file  ?Social Connections: Not on  file  ?Intimate Partner Violence: Not on file  ? ? ?Allergies  ?Allergen Reactions  ? Atorvastatin Other (See Comments)  ?  Muscle pain  ? ? ?Current Outpatient Medications  ?Medication Sig Dispense Refill  ? aspirin EC 81 MG tablet Take 1 tablet (81 mg total) by mouth daily. Swallow whole. (Patient taking differently: Take 81 mg by mouth at bedtime. Swallow whole.) 90 tablet 3  ? atorvastatin (LIPITOR) 10 MG tablet Take 10 mg by mouth daily.    ? dextromethorphan (DELSYM) 30 MG/5ML liquid Take 2.5 mLs (15 mg total) by mouth 2 (two) times daily. 89 mL 0  ? metoprolol tartrate (LOPRESSOR) 25 MG tablet Take 0.5 tablets (12.5 mg total) by mouth 2 (two) times daily. 90 tablet 3  ? naproxen sodium (ALEVE) 220 MG tablet Take 220-440 mg by mouth 2 (two) times daily as needed (for back pain).    ?  nitroGLYCERIN (NITROSTAT) 0.4 MG SL tablet Place 1 tablet (0.4 mg total) under the tongue every 5 (five) minutes as needed for chest pain. 25 tablet 3  ? rosuvastatin (CRESTOR) 20 MG tablet Take 1 tablet (20 mg total) by mouth daily. 30 tablet 2  ? terazosin (HYTRIN) 5 MG capsule Take 5 mg by mouth at bedtime.    ? ?No current facility-administered medications for this visit.  ? ? ?REVIEW OF SYSTEMS:  ?'[X]'$  denotes positive finding, '[ ]'$  denotes negative finding ?Cardiac  Comments:  ?Chest pain or chest pressure:    ?Shortness of breath upon exertion:    ?Short of breath when lying flat:    ?Irregular heart rhythm:    ?    ?Vascular    ?Pain in calf, thigh, or hip brought on by ambulation:    ?Pain in feet at night that wakes you up from your sleep:     ?Blood clot in your veins:    ?Leg swelling:     ?    ?Pulmonary    ?Oxygen at home:    ?Productive cough:     ?Wheezing:     ?    ?Neurologic    ?Sudden weakness in arms or legs:     ?Sudden numbness in arms or legs:     ?Sudden onset of difficulty speaking or slurred speech:    ?Temporary loss of vision in one eye:     ?Problems with dizziness:     ?    ?Gastrointestinal    ?Blood  in stool:     ?Vomited blood:     ?    ?Genitourinary    ?Burning when urinating:     ?Blood in urine:    ?    ?Psychiatric    ?Major depression:     ?    ?Hematologic    ?Bleeding problems:    ?Problems with b

## 2021-11-15 ENCOUNTER — Emergency Department (HOSPITAL_BASED_OUTPATIENT_CLINIC_OR_DEPARTMENT_OTHER): Payer: Medicare Other | Admitting: Radiology

## 2021-11-15 ENCOUNTER — Other Ambulatory Visit: Payer: Self-pay

## 2021-11-15 ENCOUNTER — Encounter (HOSPITAL_BASED_OUTPATIENT_CLINIC_OR_DEPARTMENT_OTHER): Payer: Self-pay | Admitting: Obstetrics and Gynecology

## 2021-11-15 ENCOUNTER — Emergency Department (HOSPITAL_BASED_OUTPATIENT_CLINIC_OR_DEPARTMENT_OTHER): Payer: Medicare Other

## 2021-11-15 ENCOUNTER — Emergency Department (HOSPITAL_BASED_OUTPATIENT_CLINIC_OR_DEPARTMENT_OTHER)
Admission: EM | Admit: 2021-11-15 | Discharge: 2021-11-16 | Disposition: A | Discharge status: Home / Self Care | Payer: Medicare Other | Attending: Emergency Medicine | Admitting: Emergency Medicine

## 2021-11-15 DIAGNOSIS — Z955 Presence of coronary angioplasty implant and graft: Secondary | ICD-10-CM | POA: Diagnosis not present

## 2021-11-15 DIAGNOSIS — M545 Low back pain, unspecified: Secondary | ICD-10-CM | POA: Diagnosis not present

## 2021-11-15 DIAGNOSIS — I2510 Atherosclerotic heart disease of native coronary artery without angina pectoris: Secondary | ICD-10-CM | POA: Insufficient documentation

## 2021-11-15 DIAGNOSIS — Z79899 Other long term (current) drug therapy: Secondary | ICD-10-CM | POA: Insufficient documentation

## 2021-11-15 DIAGNOSIS — Z7982 Long term (current) use of aspirin: Secondary | ICD-10-CM | POA: Insufficient documentation

## 2021-11-15 DIAGNOSIS — W01198A Fall on same level from slipping, tripping and stumbling with subsequent striking against other object, initial encounter: Secondary | ICD-10-CM | POA: Diagnosis not present

## 2021-11-15 DIAGNOSIS — M7918 Myalgia, other site: Secondary | ICD-10-CM

## 2021-11-15 DIAGNOSIS — I10 Essential (primary) hypertension: Secondary | ICD-10-CM | POA: Insufficient documentation

## 2021-11-15 DIAGNOSIS — M25552 Pain in left hip: Secondary | ICD-10-CM | POA: Insufficient documentation

## 2021-11-15 DIAGNOSIS — Z96642 Presence of left artificial hip joint: Secondary | ICD-10-CM | POA: Diagnosis not present

## 2021-11-15 DIAGNOSIS — W19XXXA Unspecified fall, initial encounter: Secondary | ICD-10-CM

## 2021-11-15 MED ORDER — TRAMADOL HCL 50 MG PO TABS
50.0000 mg | ORAL_TABLET | Freq: Once | ORAL | Status: AC
  Administered 2021-11-15: 50 mg via ORAL
  Filled 2021-11-15: qty 1

## 2021-11-15 MED ORDER — TRAMADOL HCL 50 MG PO TABS: 50.0000 mg | ORAL_TABLET | Freq: Four times a day (QID) | ORAL | 0 refills | Status: DC | PRN

## 2021-11-15 NOTE — ED Notes (Signed)
Called [facility] @ [phone] and requested transport back to patient's home.  Was advised that it would be a little bit but they would send someone as soon as they were available. ?

## 2021-11-15 NOTE — ED Provider Notes (Signed)
?[facility] [facility]PT ?Provider Note ? ? ?CSN: [id number] ?Arrival date & time: 11/15/21  1629 ? ?  ? ?History ? ?Chief Complaint  ?Patient presents with  ? Fall  ? ? ?Dodge Ator is a 88 y.o. male. ? ?The history is provided by the patient, a relative, the spouse and medical records. No language interpreter was used.  ?Fall ?This is a new problem. The current episode started more than 1 week ago. The problem occurs rarely. The problem has been gradually worsening (l butock and hip pain). Pertinent negatives include no chest pain, no abdominal pain, no headaches and no shortness of breath. The symptoms are aggravated by walking. Nothing relieves the symptoms. He has tried nothing for the symptoms. The treatment provided no relief.  ? ?  ? ?Home Medications ?Prior to Admission medications   ?Medication Sig Start Date End Date Taking? Authorizing Provider  ?aspirin EC 81 MG tablet Take 1 tablet (81 mg total) by mouth daily. Swallow whole. ?Patient taking differently: Take 81 mg by mouth at bedtime. Swallow whole. 06/03/20   Burtis Junes, NP  ?atorvastatin (LIPITOR) 10 MG tablet Take 10 mg by mouth daily.    [provider]  ?dextromethorphan (DELSYM) 30 MG/5ML liquid Take 2.5 mLs (15 mg total) by mouth 2 (two) times daily. 03/30/21   Sanjuan Dame, MD  ?metoprolol tartrate (LOPRESSOR) 25 MG tablet Take 0.5 tablets (12.5 mg total) by mouth 2 (two) times daily. 08/29/13   Belva Crome, MD  ?naproxen sodium (ALEVE) 220 MG tablet Take 220-440 mg by mouth 2 (two) times daily as needed (for back pain).    [provider]  ?nitroGLYCERIN (NITROSTAT) 0.4 MG SL tablet Place 1 tablet (0.4 mg total) under the tongue every 5 (five) minutes as needed for chest pain. 10/29/21   Belva Crome, MD  ?rosuvastatin (CRESTOR) 20 MG tablet Take 1 tablet (20 mg total) by mouth daily. 03/30/21 06/28/21  Sanjuan Dame, MD  ?terazosin (HYTRIN) 5 MG capsule Take 5 mg by mouth at bedtime.     [provider]  ?   ? ?Allergies    ?Atorvastatin   ? ?Review of Systems   ?Review of Systems  ?Constitutional:  Negative for chills, fatigue and fever.  ?HENT:  Negative for congestion.   ?Respiratory:  Negative for cough, chest tightness, shortness of breath and wheezing.   ?Cardiovascular:  Negative for chest pain, palpitations and leg swelling.  ?Gastrointestinal:  Negative for abdominal pain, constipation, diarrhea, nausea and vomiting.  ?Genitourinary:  Negative for decreased urine volume, dysuria, flank pain and frequency.  ?Musculoskeletal:  Negative for back pain, neck pain and neck stiffness.  ?Skin:  Positive for color change (bruising that has resolved). Negative for rash and wound.  ?Neurological:  Negative for dizziness, weakness, light-headedness and headaches.  ?Psychiatric/Behavioral:  Negative for agitation and confusion.   ?All other systems reviewed and are negative. ? ?Physical Exam ?Updated Vital Signs ?BP (!) 146/71 (BP Location: Left Arm)   Pulse 65   Temp (!) 97.5 ?F (36.4 ?C)   Resp 16   SpO2 98%  ?Physical Exam ?Vitals and nursing note reviewed.  ?Constitutional:   ?   General: He is not in acute distress. ?   Appearance: He is well-developed. He is not ill-appearing, toxic-appearing or diaphoretic.  ?HENT:  ?   Head: Normocephalic and atraumatic.  ?Eyes:  ?   Extraocular Movements: Extraocular movements intact.  ?   Conjunctiva/sclera: Conjunctivae normal.  ?  Pupils: Pupils are equal, round, and reactive to light.  ?Cardiovascular:  ?   Rate and Rhythm: Normal rate and regular rhythm.  ?   Heart sounds: No murmur heard. ?Pulmonary:  ?   Effort: Pulmonary effort is normal. No respiratory distress.  ?   Breath sounds: Normal breath sounds. No wheezing, rhonchi or rales.  ?Chest:  ?   Chest wall: No tenderness.  ?Abdominal:  ?   General: Abdomen is flat.  ?   Palpations: Abdomen is soft.  ?   Tenderness: There is no abdominal tenderness. There is no right CVA tenderness, left  CVA tenderness, guarding or rebound.  ?Musculoskeletal:     ?   General: Tenderness present. No swelling.  ?   Cervical back: Neck supple. No tenderness.  ?     Back: ? ?   Right lower leg: No edema.  ?   Left lower leg: No edema.  ?     Legs: ? ?   Comments: Tenderness in left buttock and pelvis area. Abdomen nontender.  Back nontender.  Lungs clear and chest nontender.  Normal sensation, strength, and pulses in lower extremities.  ?Skin: ?   General: Skin is warm and dry.  ?   Capillary Refill: Capillary refill takes less than 2 seconds.  ?   Coloration: Skin is not pale.  ?   Findings: No erythema or rash.  ?Neurological:  ?   General: No focal deficit present.  ?   Mental Status: He is alert.  ?   Sensory: No sensory deficit.  ?   Motor: No weakness.  ?Psychiatric:     ?   Mood and Affect: Mood normal.  ? ? ?ED Results / Procedures / Treatments   ?Labs ?(all labs ordered are listed, but only abnormal results are displayed) ?Labs Reviewed - No data to display ? ?EKG ?None ? ?Radiology ?CT PELVIS WO CONTRAST ? ?Result Date: 11/15/2021 ?CLINICAL DATA:  88 year old with hip trauma, fracture suspected. Fall 2 weeks ago with worsening left-sided pain. EXAM: CT PELVIS WITHOUT CONTRAST TECHNIQUE: Multidetector CT imaging of the pelvis was performed following the standard protocol without intravenous contrast. RADIATION DOSE REDUCTION: This exam was performed according to the departmental dose-optimization program which includes automated exposure control, adjustment of the mA and/or kV according to patient size and/or use of iterative reconstruction technique. COMPARISON:  Radiograph earlier today FINDINGS: Urinary Tract:  Suspect multiple bladder stones. Bowel:  No dilatation or inflammation of pelvic bowel loops. Vascular/Lymphatic: Prior abdominal aortic aneurysm repair. The aneurysm sac measures 5.8 cm. The right common iliac artery measures 2.8 cm, the left common iliac artery measures 2.7 cm. Diffuse  atherosclerosis. No pelvic adenopathy. Reproductive: Enlarged prostate gland causing mass effect on the bladder base. Other:  No pelvic free fluid. Musculoskeletal: Left hip arthroplasty with associated streak artifact. Allowing for this, there is no evidence periprosthetic fracture or lucency. Femoral stem is midline. Intact pubic rami. Pubic symphysis is congruent. The remainder of the bony pelvis including the sacrum is intact. Moderate right hip osteoarthritis. There is patchy subcutaneous edema in the soft tissues posterolateral to the left hip. No definite intramuscular hematoma allowing for streak artifact. IMPRESSION: 1. Left hip arthroplasty without evidence of periprosthetic fracture or complication. No pelvic fracture. 2. Patchy subcutaneous edema in the soft tissues posterolateral to the left hip. 3. Enlarged prostate gland causing mass effect on the bladder base. Multiple bladder stones. 4. Prior abdominal aortic aneurysm repair. The aneurysm sac measures 5.8 cm. There  are no prior exams available to assess for chronicity. Recommend correlation with any prior outside exams if available. Aortic Atherosclerosis (ICD10-I70.0). Electronically Signed   By: Keith Rake M.D.   On: 11/15/2021 19:20  ? ?DG HIP UNILAT WITH PELVIS 2-3 VIEWS LEFT ? ?Result Date: 11/15/2021 ?CLINICAL DATA:  Pain on the left buttocks. Fell approximately 2 weeks ago. EXAM: DG HIP (WITH OR WITHOUT PELVIS) 2-3V LEFT COMPARISON:  None. FINDINGS: Status post left hip arthroplasty. No perihardware loosening or fracture. Ilioischial and iliopubic lines are maintained. Mild-to-moderate right hip osteoarthritis. Aorto bi iliac vascular stent is noted. Vascular calcifications. IMPRESSION: Status post left hip arthroplasty without evidence of fracture or perihardware loosening. Electronically Signed   By: Keane Police D.O.   On: 11/15/2021 17:27   ? ?Procedures ?Procedures  ? ? ?Medications Ordered in ED ?Medications  ?traMADol (ULTRAM)  tablet 50 mg (has no administration in time range)  ? ? ?ED Course/ Medical Decision Making/ A&P ?  ?                        ?Medical Decision Making ?Amount and/or Complexity of Data Reviewed ?Radiology: ordered. ? ?Risk ?Presc

## 2021-11-15 NOTE — ED Triage Notes (Signed)
Patient reports to the ER for a fall x2 weeks ago. Patient reports pain on the left buttocks. Patient reports that initially it was tender and the last few days he has had increased assistance needed in care. Patient reports he has had a hip replacement on the left side. Pulses intact and sensory intact on left leg, no shortening or rotation  ? ?Per EMS ? ?136/74 ?HR 64 ?DS28 ?SPO2 97 ?CBG 172 ?

## 2021-11-15 NOTE — Discharge Instructions (Signed)
Your history, exam, work-up today did not show any evidence of acute fracture in your pelvis or hip and it revealed no evidence of hardware abnormality.  It did show the enlarged aorta which you are aware of, please follow-up with your PCP for this.  It also showed the enlarged prostate and bladder stones which we discussed.  We had a long shared decision-making conversation about pain management and agreed to let you go home to use the cream externally and then over-the-counter medications.  If the pain gets unbearable, we will give you a short course of Ultram to help with the pain.  As we discussed, this is a narcotic so please very careful as I do not want to have you fall again.  Please follow-up with your primary doctor.  If any symptoms change or worsen acutely, please return to the nearest emergency department. ?

## 2022-01-05 ENCOUNTER — Other Ambulatory Visit: Payer: Self-pay

## 2022-01-05 DIAGNOSIS — I6523 Occlusion and stenosis of bilateral carotid arteries: Secondary | ICD-10-CM

## 2022-01-19 ENCOUNTER — Encounter: Payer: Self-pay | Admitting: Vascular Surgery

## 2022-02-03 ENCOUNTER — Ambulatory Visit
Admission: RE | Admit: 2022-02-03 | Discharge: 2022-02-03 | Disposition: A | Discharge status: Home / Self Care | Payer: Medicare Other | Source: Ambulatory Visit | Attending: Vascular Surgery | Admitting: Vascular Surgery

## 2022-02-03 DIAGNOSIS — I6523 Occlusion and stenosis of bilateral carotid arteries: Secondary | ICD-10-CM

## 2022-02-03 MED ORDER — IOPAMIDOL (ISOVUE-370) INJECTION 76%
75.0000 mL | Freq: Once | INTRAVENOUS | Status: AC | PRN
  Administered 2022-02-03: 75 mL via INTRAVENOUS

## 2022-02-07 ENCOUNTER — Encounter: Payer: Self-pay | Admitting: Vascular Surgery

## 2022-02-07 NOTE — Progress Notes (Unsigned)
[facility] [place]  ASSESSMENT / PLAN: 88 y.o. male with right carotid artery occlusion; asymptomatic, but severe left carotid artery stenosis. He is elderly and deconditioned.   The patient should continue best medical therapy for carotid artery stenosis including: Complete cessation from all tobacco products. Blood glucose control with goal A1c < 7%. Blood pressure control with goal blood pressure < 140/90 mmHg. Lipid reduction therapy with goal LDL-C <100 mg/dL (<70 if symptomatic from carotid artery stenosis).  Aspirin '81mg'$  PO QD.  Atorvastatin 40-'80mg'$  PO QD (or other "high intensity" statin therapy).   No role for revascularization for occluded right carotid artery.  Given his bladder cancer, advanced age, and deconditioning I do not think he is a candidate for prophylactic intervention on an asymptomatic carotid artery stenosis.  He has CT evidence of a endoleak.  I will see him again in 3 months with a CT angiogram of his abdomen and pelvis to better characterize this endoleak.   CHIEF COMPLAINT: Left-sided weakness   HISTORY OF PRESENT ILLNESS: Tyler [name] is a 88 y.o. male admitted to the hospital for left-sided weakness beginning yesterday.  The patient reports he was in his typical state of health yesterday when he noticed left-sided weakness, and facial drooping.  He reports some persistent weakness in his left upper extremity and left lower extremity.  His facial droop has resolved.  He has been deconditioned and has had hard time taking care of himself at home.  He lives independently with his wife.   05/04/21: Patient presents to clinic for follow-up after hospitalization.  He reports he is doing okay.  He is ambulating with a walker.  He is still living independently with his wife.  We reviewed his carotid duplex ultrasound findings.  We had a long discussion of risks, benefits, and alternatives to intervention.  11/09/21: Returns for surveillance.   Having difficulty with hypotension, causing weakness and dizziness.  A bladder mass has been discovered, he is under going further care with Dr. Norma Fredrickson of [facility].  He has no focal symptoms.  No unilateral weakness, difficulty speaking, facial droop.  VASCULAR SURGICAL HISTORY: EVAR 2015.   VASCULAR RISK FACTORS: Positive history of stroke / transient ischemic attack. Positive history of coronary artery disease. +history of CABG.  Negative history of diabetes mellitus. Last A1c 5.9. Negative history of smoking. Not actively smoking. Positive history of hypertension.  Positive history of chronic kidney disease.  Last GFR 44.  Negative history of chronic obstructive pulmonary disease.   FUNCTIONAL STATUS: ECOG performance status: (2) Ambulatory and capable of self care, unable to carry out work activity, up and about > 50% or waking hours Ambulatory status: Minimally ambulatory (e.g. about the home only)  Past Medical History:  Diagnosis Date   AAA (abdominal aortic aneurysm) ([place])    a. 05/2014 s/p stent grafting [facility]).   Bronchitis    CAD (coronary artery disease)    a. 06/2013 VF Arrest/Cath: 3VD, EF 50%;  b. 06/2013 CABG x 4: LIMA->LAD, VG->PDA->RPL, VG->OM.   CVA (cerebral infarction)    Erectile dysfunction    Essential hypertension    GERD (gastroesophageal reflux disease)    H/O hiatal hernia    HOH (hard of hearing)    wears hearing aid in right ear   Hyperlipidemia    Osteoarthritis    Pneumonia    YRS AGO   Stroke [facility])     Past Surgical History:  Procedure Laterality Date   CORONARY ARTERY BYPASS  GRAFT N/A 06/27/2013   Procedure: CORONARY ARTERY BYPASS GRAFTING (CABG);  Surgeon: Ivin Poot, MD;  Location: [place];  Service: Open Heart Surgery;  Laterality: N/A;  times four utilizing the left internal mammary artery and the right greater saphenous vein harvested endoscopically   ENDOVEIN HARVEST OF GREATER SAPHENOUS VEIN Right 06/27/2013    Procedure: ENDOVEIN HARVEST OF GREATER SAPHENOUS VEIN;  Surgeon: Ivin Poot, MD;  Location: [place];  Service: Open Heart Surgery;  Laterality: Right;   EYE SURGERY     bilateral cataracts   INNER EAR SURGERY Left 1982   INNER EAR SURGERY     severed a nerve in his left ear.    LEFT HEART CATHETERIZATION WITH CORONARY ANGIOGRAM N/A 06/27/2013   Procedure: LEFT HEART CATHETERIZATION WITH CORONARY ANGIOGRAM;  Surgeon: Sinclair Grooms, MD;  Location: [facility] CATH LAB;  Service: Cardiovascular;  Laterality: N/A;  iabp insertion, cpr, intubation, emergent cvts   TEE WITHOUT CARDIOVERSION N/A 06/27/2013   Procedure: TRANSESOPHAGEAL ECHOCARDIOGRAM (TEE);  Surgeon: Ivin Poot, MD;  Location: [place];  Service: Open Heart Surgery;  Laterality: N/A;   TONSILLECTOMY     TOTAL HIP ARTHROPLASTY Left 09/01/2014   Procedure: TOTAL HIP ARTHROPLASTY;  Surgeon: Kerin Salen, MD;  Location: [place];  Service: Orthopedics;  Laterality: Left;    Family History  Problem Relation Age of Onset   Heart disease Sister    Heart disease Brother    Heart attack Sister    Heart attack Brother    Colon cancer Neg Hx    Hypertension Neg Hx    Stroke Neg Hx     Social History   Socioeconomic History   Marital status: Married    Spouse name: Not on file   Number of children: Not on file   Years of education: Not on file   Highest education level: Not on file  Occupational History   Not on file  Tobacco Use   Smoking status: Never   Smokeless tobacco: Never  Vaping Use   Vaping Use: Never used  Substance and Sexual Activity   Alcohol use: Yes    Comment: Rare   Drug use: No   Sexual activity: Not Currently  Other Topics Concern   Not on file  Social History Narrative   Not on file   Social Determinants of Health   Financial Resource Strain: Not on file  Food Insecurity: Not on file  Transportation Needs: Not on file  Physical Activity: Not on file  Stress: Not on file  Social Connections: Not on  file  Intimate Partner Violence: Not on file    Allergies  Allergen Reactions   Atorvastatin Other (See Comments)    Muscle pain    Current Outpatient Medications  Medication Sig Dispense Refill   aspirin EC 81 MG tablet Take 1 tablet (81 mg total) by mouth daily. Swallow whole. (Patient taking differently: Take 81 mg by mouth at bedtime. Swallow whole.) 90 tablet 3   atorvastatin (LIPITOR) 10 MG tablet Take 10 mg by mouth daily.     dextromethorphan (DELSYM) 30 MG/5ML liquid Take 2.5 mLs (15 mg total) by mouth 2 (two) times daily. 89 mL 0   metoprolol tartrate (LOPRESSOR) 25 MG tablet Take 0.5 tablets (12.5 mg total) by mouth 2 (two) times daily. 90 tablet 3   naproxen sodium (ALEVE) 220 MG tablet Take 220-440 mg by mouth 2 (two) times daily as needed (for back pain).  nitroGLYCERIN (NITROSTAT) 0.4 MG SL tablet Place 1 tablet (0.4 mg total) under the tongue every 5 (five) minutes as needed for chest pain. 25 tablet 3   rosuvastatin (CRESTOR) 20 MG tablet Take 1 tablet (20 mg total) by mouth daily. 30 tablet 2   terazosin (HYTRIN) 5 MG capsule Take 5 mg by mouth at bedtime.     traMADol (ULTRAM) 50 MG tablet Take 1 tablet (50 mg total) by mouth every 6 (six) hours as needed for severe pain. 15 tablet 0   No current facility-administered medications for this visit.    REVIEW OF SYSTEMS:  '[X]'$  denotes positive finding, '[ ]'$  denotes negative finding Cardiac  Comments:  Chest pain or chest pressure:    Shortness of breath upon exertion:    Short of breath when lying flat:    Irregular heart rhythm:        Vascular    Pain in calf, thigh, or hip brought on by ambulation:    Pain in feet at night that wakes you up from your sleep:     Blood clot in your veins:    Leg swelling:         Pulmonary    Oxygen at home:    Productive cough:     Wheezing:         Neurologic    Sudden weakness in arms or legs:     Sudden numbness in arms or legs:     Sudden onset of difficulty  speaking or slurred speech:    Temporary loss of vision in one eye:     Problems with dizziness:         Gastrointestinal    Blood in stool:     Vomited blood:         Genitourinary    Burning when urinating:     Blood in urine:        Psychiatric    Major depression:         Hematologic    Bleeding problems:    Problems with blood clotting too easily:        Skin    Rashes or ulcers:        Constitutional    Fever or chills:      PHYSICAL EXAM There were no vitals filed for this visit.   Constitutional: Elderly and chronically ill. no distress. Appears well nourished.  Neurologic: CN intact. no focal findings. no sensory loss. Psychiatric:  Mood and affect symmetric and appropriate. Eyes:  No icterus. No conjunctival pallor. Ears, nose, throat:  mucous membranes moist. Midline trachea.  Cardiac: regular rate and rhythm.  Respiratory:  unlabored. Abdominal: non-distended.  Extremity: no edema. no cyanosis. no pallor.  Skin: no gangrene. no ulceration.  Lymphatic: no Stemmer's sign. no palpable lymphadenopathy.  PERTINENT LABORATORY AND RADIOLOGIC DATA  Most recent CBC    Latest Ref Rng & Units 06/16/2021    2:39 PM 03/29/2021    2:07 AM 03/28/2021    1:37 AM  CBC  WBC 4.0 - 10.5 K/uL 7.8  9.0  7.8   Hemoglobin 13.0 - 17.0 g/dL 12.0  13.7  13.1   Hematocrit 39.0 - 52.0 % 37.0  41.7  40.3   Platelets 150 - 400 K/uL 136  166  158      Most recent CMP    Latest Ref Rng & Units 06/16/2021    6:23 PM 06/16/2021    2:39 PM 03/29/2021    2:07 AM  CMP  Glucose 70 - 99 mg/dL  95  103   BUN 8 - 23 mg/dL  27  24   Creatinine 0.61 - 1.24 mg/dL  1.43  1.53   Sodium 135 - 145 mmol/L  135  136   Potassium 3.5 - 5.1 mmol/L 3.8  6.0  4.6   Chloride 98 - 111 mmol/L  104  102   CO2 22 - 32 mmol/L  22  26   Calcium 8.9 - 10.3 mg/dL  9.2  8.7     Renal function CrCl cannot be calculated (Patient's most recent lab result is older than the maximum 21 days  allowed.).  Hgb A1c MFr Bld (%)  Date Value  03/25/2021 5.9 (H)    LDL Chol Calc (NIH)  Date Value Ref Range Status  06/03/2020 100 (H) 0 - 99 mg/dL Final   LDL Cholesterol  Date Value Ref Range Status  03/25/2021 75 0 - 99 mg/dL Final    Comment:           Total Cholesterol/HDL:CHD Risk Coronary Heart Disease Risk Table                     Men   Women  1/2 Average Risk   3.4   3.3  Average Risk       5.0   4.4  2 X Average Risk   9.6   7.1  3 X Average Risk  23.4   11.0        Use the calculated Patient Ratio above and the CHD Risk Table to determine the patient's CHD Risk.        ATP III CLASSIFICATION (LDL):  <100     mg/dL   Optimal  100-129  mg/dL   Near or Above                    Optimal  130-159  mg/dL   Borderline  160-189  mg/dL   High  >190     mg/dL   Very High Performed at [facility] [address], [place],  [address]     Carotid duplex Right Carotid: Evidence consistent with a total occlusion of the right  ICA.   Left Carotid: Velocities in the left ICA are consistent with a 80-99%  stenosis.                Non-hemodynamically significant plaque <50% noted in the  CCA.   Vertebrals:  Bilateral vertebral arteries demonstrate antegrade flow.  Subclavians: Normal flow hemodynamics were seen in bilateral subclavian               arteries.   Yevonne Aline. Stanford Breed, MD [facility] Phone Number: [phone] 02/07/2022 2:33 PM  Total time spent on preparing this encounter including chart review, data review, collecting history, examining the patient, coordinating care for this established patient, 30 minutes.  Portions of this report may have been transcribed using voice recognition software.  Every effort has been made to ensure accuracy; however, inadvertent computerized transcription errors may still be present.

## 2022-02-08 ENCOUNTER — Ambulatory Visit (INDEPENDENT_AMBULATORY_CARE_PROVIDER_SITE_OTHER): Payer: No Typology Code available for payment source | Admitting: Vascular Surgery

## 2022-02-08 ENCOUNTER — Encounter: Payer: Self-pay | Admitting: Vascular Surgery

## 2022-02-08 VITALS — BP 124/67 | HR 73 | Temp 98.7°F | Resp 20 | Ht 70.0 in | Wt 230.0 lb

## 2022-02-08 DIAGNOSIS — I6523 Occlusion and stenosis of bilateral carotid arteries: Secondary | ICD-10-CM | POA: Diagnosis not present

## 2022-02-08 DIAGNOSIS — Z8679 Personal history of other diseases of the circulatory system: Secondary | ICD-10-CM | POA: Diagnosis not present

## 2022-02-08 DIAGNOSIS — Z9889 Other specified postprocedural states: Secondary | ICD-10-CM

## 2022-02-24 ENCOUNTER — Encounter (HOSPITAL_COMMUNITY): Payer: Self-pay

## 2022-02-24 ENCOUNTER — Encounter (HOSPITAL_BASED_OUTPATIENT_CLINIC_OR_DEPARTMENT_OTHER): Payer: Self-pay | Admitting: Emergency Medicine

## 2022-02-24 ENCOUNTER — Inpatient Hospital Stay (HOSPITAL_BASED_OUTPATIENT_CLINIC_OR_DEPARTMENT_OTHER)
Admission: EM | Admit: 2022-02-24 | Discharge: 2022-02-27 | DRG: 378 | Disposition: A | Discharge status: Home Health | Payer: No Typology Code available for payment source | Attending: Internal Medicine | Admitting: Internal Medicine

## 2022-02-24 ENCOUNTER — Other Ambulatory Visit: Payer: Self-pay

## 2022-02-24 DIAGNOSIS — Z951 Presence of aortocoronary bypass graft: Secondary | ICD-10-CM | POA: Diagnosis not present

## 2022-02-24 DIAGNOSIS — K640 First degree hemorrhoids: Secondary | ICD-10-CM | POA: Diagnosis not present

## 2022-02-24 DIAGNOSIS — E785 Hyperlipidemia, unspecified: Secondary | ICD-10-CM | POA: Diagnosis present

## 2022-02-24 DIAGNOSIS — I2510 Atherosclerotic heart disease of native coronary artery without angina pectoris: Secondary | ICD-10-CM | POA: Diagnosis present

## 2022-02-24 DIAGNOSIS — R310 Gross hematuria: Secondary | ICD-10-CM | POA: Diagnosis not present

## 2022-02-24 DIAGNOSIS — Z7902 Long term (current) use of antithrombotics/antiplatelets: Secondary | ICD-10-CM | POA: Diagnosis not present

## 2022-02-24 DIAGNOSIS — Z96642 Presence of left artificial hip joint: Secondary | ICD-10-CM | POA: Diagnosis not present

## 2022-02-24 DIAGNOSIS — Z6832 Body mass index (BMI) 32.0-32.9, adult: Secondary | ICD-10-CM

## 2022-02-24 DIAGNOSIS — C679 Malignant neoplasm of bladder, unspecified: Secondary | ICD-10-CM | POA: Diagnosis not present

## 2022-02-24 DIAGNOSIS — K219 Gastro-esophageal reflux disease without esophagitis: Secondary | ICD-10-CM | POA: Diagnosis present

## 2022-02-24 DIAGNOSIS — E669 Obesity, unspecified: Secondary | ICD-10-CM | POA: Diagnosis not present

## 2022-02-24 DIAGNOSIS — N400 Enlarged prostate without lower urinary tract symptoms: Secondary | ICD-10-CM | POA: Diagnosis present

## 2022-02-24 DIAGNOSIS — R627 Adult failure to thrive: Secondary | ICD-10-CM | POA: Diagnosis present

## 2022-02-24 DIAGNOSIS — D649 Anemia, unspecified: Secondary | ICD-10-CM | POA: Diagnosis present

## 2022-02-24 DIAGNOSIS — Z79899 Other long term (current) drug therapy: Secondary | ICD-10-CM | POA: Diagnosis not present

## 2022-02-24 DIAGNOSIS — N1831 Chronic kidney disease, stage 3a: Secondary | ICD-10-CM | POA: Diagnosis present

## 2022-02-24 DIAGNOSIS — D122 Benign neoplasm of ascending colon: Secondary | ICD-10-CM

## 2022-02-24 DIAGNOSIS — D62 Acute posthemorrhagic anemia: Secondary | ICD-10-CM | POA: Diagnosis present

## 2022-02-24 DIAGNOSIS — Z7982 Long term (current) use of aspirin: Secondary | ICD-10-CM | POA: Diagnosis not present

## 2022-02-24 DIAGNOSIS — I129 Hypertensive chronic kidney disease with stage 1 through stage 4 chronic kidney disease, or unspecified chronic kidney disease: Secondary | ICD-10-CM | POA: Diagnosis present

## 2022-02-24 DIAGNOSIS — Z8673 Personal history of transient ischemic attack (TIA), and cerebral infarction without residual deficits: Secondary | ICD-10-CM

## 2022-02-24 DIAGNOSIS — Z974 Presence of external hearing-aid: Secondary | ICD-10-CM

## 2022-02-24 DIAGNOSIS — Z8249 Family history of ischemic heart disease and other diseases of the circulatory system: Secondary | ICD-10-CM | POA: Diagnosis not present

## 2022-02-24 DIAGNOSIS — K922 Gastrointestinal hemorrhage, unspecified: Secondary | ICD-10-CM

## 2022-02-24 DIAGNOSIS — R195 Other fecal abnormalities: Secondary | ICD-10-CM

## 2022-02-24 DIAGNOSIS — K31811 Angiodysplasia of stomach and duodenum with bleeding: Principal | ICD-10-CM | POA: Diagnosis present

## 2022-02-24 DIAGNOSIS — Z9842 Cataract extraction status, left eye: Secondary | ICD-10-CM | POA: Diagnosis not present

## 2022-02-24 DIAGNOSIS — I700 Atherosclerosis of aorta: Secondary | ICD-10-CM | POA: Diagnosis not present

## 2022-02-24 DIAGNOSIS — I10 Essential (primary) hypertension: Secondary | ICD-10-CM | POA: Diagnosis present

## 2022-02-24 DIAGNOSIS — Z9841 Cataract extraction status, right eye: Secondary | ICD-10-CM

## 2022-02-24 LAB — URINALYSIS, ROUTINE W REFLEX MICROSCOPIC
Bilirubin Urine: NEGATIVE
Glucose, UA: NEGATIVE mg/dL
Ketones, ur: NEGATIVE mg/dL
Nitrite: NEGATIVE
Protein, ur: 100 mg/dL — AB
RBC / HPF: >50 RBC/hpf — ABNORMAL HIGH (ref 0–5)
Specific Gravity, Urine: 1.02 (ref 1.005–1.030)
pH: 6 (ref 5.0–8.0)

## 2022-02-24 LAB — CBC WITH DIFFERENTIAL/PLATELET
Abs Immature Granulocytes: 0.02 10*3/uL (ref 0.00–0.07)
Basophils Absolute: 0.1 10*3/uL (ref 0.0–0.1)
Basophils Relative: 1 %
Eosinophils Absolute: 0.1 10*3/uL (ref 0.0–0.5)
Eosinophils Relative: 1 %
HCT: 19.8 % — ABNORMAL LOW (ref 39.0–52.0)
Hemoglobin: 5.9 g/dL — CL (ref 13.0–17.0)
Immature Granulocytes: 0 %
Lymphocytes Relative: 23 %
Lymphs Abs: 1.9 10*3/uL (ref 0.7–4.0)
MCH: 24.5 pg — ABNORMAL LOW (ref 26.0–34.0)
MCHC: 29.8 g/dL — ABNORMAL LOW (ref 30.0–36.0)
MCV: 82.2 fL (ref 80.0–100.0)
Monocytes Absolute: 0.7 10*3/uL (ref 0.1–1.0)
Monocytes Relative: 9 %
Neutro Abs: 5.3 10*3/uL (ref 1.7–7.7)
Neutrophils Relative %: 66 %
Platelets: 188 10*3/uL (ref 150–400)
RBC: 2.41 MIL/uL — ABNORMAL LOW (ref 4.22–5.81)
RDW: 17.1 % — ABNORMAL HIGH (ref 11.5–15.5)
WBC: 8.1 10*3/uL (ref 4.0–10.5)
nRBC: 0 % (ref 0.0–0.2)

## 2022-02-24 LAB — BASIC METABOLIC PANEL
Anion gap: 10 (ref 5–15)
BUN: 33 mg/dL — ABNORMAL HIGH (ref 8–23)
CO2: 24 mmol/L (ref 22–32)
Calcium: 9.1 mg/dL (ref 8.9–10.3)
Chloride: 107 mmol/L (ref 98–111)
Creatinine, Ser: 1.41 mg/dL — ABNORMAL HIGH (ref 0.61–1.24)
GFR, Estimated: 47 mL/min — ABNORMAL LOW (ref >60)
Glucose, Bld: 114 mg/dL — ABNORMAL HIGH (ref 70–99)
Potassium: 4.3 mmol/L (ref 3.5–5.1)
Sodium: 141 mmol/L (ref 135–145)

## 2022-02-24 LAB — RETICULOCYTES
Immature Retic Fract: 33.3 % — ABNORMAL HIGH (ref 2.3–15.9)
RBC.: 2.39 MIL/uL — ABNORMAL LOW (ref 4.22–5.81)
Retic Count, Absolute: 88.9 10*3/uL (ref 19.0–186.0)
Retic Ct Pct: 3.7 % — ABNORMAL HIGH (ref 0.4–3.1)

## 2022-02-24 LAB — OCCULT BLOOD X 1 CARD TO LAB, STOOL: Fecal Occult Bld: POSITIVE — AB

## 2022-02-24 LAB — FERRITIN: Ferritin: 11 ng/mL — ABNORMAL LOW (ref 24–336)

## 2022-02-24 MED ORDER — PANTOPRAZOLE 80MG IVPB - SIMPLE MED
80.0000 mg | Freq: Once | INTRAVENOUS | Status: AC
  Administered 2022-02-24: 80 mg via INTRAVENOUS
  Filled 2022-02-24: qty 100

## 2022-02-24 MED ORDER — PANTOPRAZOLE SODIUM 40 MG IV SOLR: 40.0000 mg | Freq: Two times a day (BID) | INTRAVENOUS | Status: DC

## 2022-02-24 MED ORDER — PANTOPRAZOLE INFUSION (NEW) - SIMPLE MED
8.0000 mg/h | INTRAVENOUS | Status: DC
  Administered 2022-02-24 – 2022-02-27 (×5): 8 mg/h via INTRAVENOUS
  Filled 2022-02-24: qty 100
  Filled 2022-02-24 (×2): qty 80
  Filled 2022-02-24 (×2): qty 100
  Filled 2022-02-24: qty 80
  Filled 2022-02-24: qty 100
  Filled 2022-02-24: qty 80
  Filled 2022-02-24: qty 100

## 2022-02-24 MED ORDER — PANTOPRAZOLE SODIUM 40 MG IV SOLR
INTRAVENOUS | Status: AC
Start: 2022-02-24 — End: 2022-02-25
  Filled 2022-02-24: qty 40

## 2022-02-24 NOTE — ED Provider Notes (Signed)
[facility] EMERGENCY DEPT Provider Note   CSN: [id number] Arrival date & time: 02/24/22  1823     History  Chief Complaint  Patient presents with   Weakness    Tyler [name] is a 88 y.o. male.  Patient here as he saw primary care doctor today and had a hemoglobin of 6.4.  Has a history of bladder cancer not undergoing any treatment at this time but is supposed to have surgery soon.  May be darker stools than normal but he intermittently has some blood in his urine because of his bladder cancer.  Patient is on Plavix for stroke history.  He has no abdominal pain.  No chest pain.  He has felt very weak the last few days.  He is also felt short of breath when he walks.  Nothing makes it worse or better.  Currently being treated for UTI.  Looks more pale than normal.  The history is provided by the patient.       Home Medications Prior to Admission medications   Medication Sig Start Date End Date Taking? Authorizing Provider  aspirin EC 81 MG tablet Take 1 tablet (81 mg total) by mouth daily. Swallow whole. Patient taking differently: Take 81 mg by mouth at bedtime. Swallow whole. 06/03/20   Burtis Junes, NP  atorvastatin (LIPITOR) 10 MG tablet Take 10 mg by mouth daily.    [provider]  dextromethorphan (DELSYM) 30 MG/5ML liquid Take 2.5 mLs (15 mg total) by mouth 2 (two) times daily. 03/30/21   Sanjuan Dame, MD  metoprolol tartrate (LOPRESSOR) 25 MG tablet Take 0.5 tablets (12.5 mg total) by mouth 2 (two) times daily. 08/29/13   Belva Crome, MD  naproxen sodium (ALEVE) 220 MG tablet Take 220-440 mg by mouth 2 (two) times daily as needed (for back pain).    [provider]  nitroGLYCERIN (NITROSTAT) 0.4 MG SL tablet Place 1 tablet (0.4 mg total) under the tongue every 5 (five) minutes as needed for chest pain. 10/29/21   Belva Crome, MD  rosuvastatin (CRESTOR) 20 MG tablet Take 1 tablet (20 mg total) by mouth daily. 03/30/21 06/28/21  Sanjuan Dame, MD  terazosin (HYTRIN) 5 MG capsule Take 5 mg by mouth at bedtime.    [provider]  traMADol (ULTRAM) 50 MG tablet Take 1 tablet (50 mg total) by mouth every 6 (six) hours as needed for severe pain. 11/15/21   Tegeler, Gwenyth Allegra, MD      Allergies    Atorvastatin    Review of Systems   Review of Systems  Physical Exam Updated Vital Signs BP 120/65   Pulse 72   Temp 97.8 F (36.6 C)   Resp 15   SpO2 99%  Physical Exam Vitals and nursing note reviewed.  Constitutional:      General: He is not in acute distress.    Appearance: He is well-developed.  HENT:     Head: Normocephalic and atraumatic.  Eyes:     Conjunctiva/sclera: Conjunctivae normal.     Comments: Pale conjunctiva  Cardiovascular:     Rate and Rhythm: Normal rate and regular rhythm.     Heart sounds: No murmur heard. Pulmonary:     Effort: Pulmonary effort is normal. No respiratory distress.     Breath sounds: Normal breath sounds.  Abdominal:     Palpations: Abdomen is soft.     Tenderness: There is no abdominal tenderness.  Genitourinary:    Comments: Stool appears dark  Musculoskeletal:        General: No swelling.     Cervical back: Neck supple.  Skin:    General: Skin is warm and dry.     Capillary Refill: Capillary refill takes less than 2 seconds.     Comments: Pale skin  Neurological:     Mental Status: He is alert.  Psychiatric:        Mood and Affect: Mood normal.     ED Results / Procedures / Treatments   Labs (all labs ordered are listed, but only abnormal results are displayed) Labs Reviewed  CBC WITH DIFFERENTIAL/PLATELET - Abnormal; Notable for the following components:      Result Value   RBC 2.41 (*)    Hemoglobin 5.9 (*)    HCT 19.8 (*)    MCH 24.5 (*)    MCHC 29.8 (*)    RDW 17.1 (*)    All other components within normal limits  RETICULOCYTES - Abnormal; Notable for the following components:   Retic Ct Pct 3.7 (*)    RBC. 2.39 (*)    Immature  Retic Fract 33.3 (*)    All other components within normal limits  OCCULT BLOOD X 1 CARD TO LAB, STOOL - Abnormal; Notable for the following components:   Fecal Occult Bld POSITIVE (*)    All other components within normal limits  BASIC METABOLIC PANEL  VITAMIN X32  FOLATE  IRON AND TIBC  FERRITIN  URINALYSIS, ROUTINE W REFLEX MICROSCOPIC    EKG None  Radiology No results found.  Procedures Procedures    Medications Ordered in ED Medications  pantoprazole (PROTONIX) 80 mg /NS 100 mL IVPB (has no administration in time range)  pantoprozole (PROTONIX) 80 mg /NS 100 mL infusion (has no administration in time range)  pantoprazole (PROTONIX) injection 40 mg (has no administration in time range)    ED Course/ Medical Decision Making/ A&P                           Medical Decision Making Amount and/or Complexity of Data Reviewed Labs: ordered.  Risk Prescription drug management. Decision regarding hospitalization.   Tyler [name] is here with outpatient hemoglobin of 6.4.  Saw primary care doctor for weakness and exertional shortness of breath.  Sent here for further evaluation.  He is on Plavix for CAD/stroke.  History of high cholesterol, AAA.  Undergoing eventual treatment for bladder cancer.  May be darker stools more so than normal.  Patient has blood sometimes in his urine but no large-volume hematuria.  He is not having any chest pain or shortness of breath or abdominal pain.  Stool looked somewhat dark on exam.  Vital signs are stable.  He is not sure when or where he is ever had a colonoscopy or EGD before.  No alcohol use.  Does not take any ibuprofen.  We will get CBC, BMP, anemia panel send Hemoccult.  My suspicion is likely GI bleed but could be some other acute on chronic anemia.  Hemoccult is positive.  Hemoglobin down to 5.9.  Per review of his lab work earlier in the day he did not have any significant anemia or electrolyte abnormality.  We will start patient on  IV Protonix bolus and infusion.  He is hemodynamically stable.  Will need blood transfusion when he gets to inpatient bed.  GI team, Dr. Benson Norway has been sent message.  Overall I suspect patient with GI bleed.  This  chart was dictated using voice recognition software.  Despite best efforts to proofread,  errors can occur which can change the documentation meaning.         Final Clinical Impression(s) / ED Diagnoses Final diagnoses:  Gastrointestinal hemorrhage, unspecified gastrointestinal hemorrhage type  Symptomatic anemia    Rx / DC Orders ED Discharge Orders     None         Lennice Sites, DO 02/24/22 2132

## 2022-02-24 NOTE — ED Triage Notes (Signed)
Patient comes from home. Was evaled by PCP for weakness,SOB. Per patient was called by pcp for critical hemoglobin of 6.  Patient appear pale, in color but reports no distress at this time  Patient current being treated for UTI Recently started on plavix

## 2022-02-24 NOTE — ED Notes (Signed)
Called [facility] to transport patient to [facility] rm[facility] [id number] 

## 2022-02-24 NOTE — ED Notes (Signed)
Date and time results received: 02/24/22 2115 (use smartphrase ".now" to insert current time)  Test: hemoglobin  Critical Value: 5.9  Name of Provider Notified: A. Curatolo, DO  Orders Received? Or Actions Taken?:  n/a

## 2022-02-25 ENCOUNTER — Observation Stay (HOSPITAL_COMMUNITY): Payer: No Typology Code available for payment source | Admitting: Anesthesiology

## 2022-02-25 ENCOUNTER — Encounter (HOSPITAL_COMMUNITY)
Admission: EM | Disposition: A | Discharge status: Home Health | Payer: Self-pay | Source: Home / Self Care | Attending: Internal Medicine

## 2022-02-25 ENCOUNTER — Observation Stay (HOSPITAL_BASED_OUTPATIENT_CLINIC_OR_DEPARTMENT_OTHER): Payer: No Typology Code available for payment source | Admitting: Anesthesiology

## 2022-02-25 ENCOUNTER — Encounter (HOSPITAL_COMMUNITY): Payer: Self-pay | Admitting: *Deleted

## 2022-02-25 DIAGNOSIS — Z974 Presence of external hearing-aid: Secondary | ICD-10-CM | POA: Diagnosis not present

## 2022-02-25 DIAGNOSIS — Z951 Presence of aortocoronary bypass graft: Secondary | ICD-10-CM | POA: Diagnosis not present

## 2022-02-25 DIAGNOSIS — I10 Essential (primary) hypertension: Secondary | ICD-10-CM

## 2022-02-25 DIAGNOSIS — C679 Malignant neoplasm of bladder, unspecified: Secondary | ICD-10-CM

## 2022-02-25 DIAGNOSIS — I2510 Atherosclerotic heart disease of native coronary artery without angina pectoris: Secondary | ICD-10-CM | POA: Diagnosis present

## 2022-02-25 DIAGNOSIS — Z8673 Personal history of transient ischemic attack (TIA), and cerebral infarction without residual deficits: Secondary | ICD-10-CM | POA: Diagnosis not present

## 2022-02-25 DIAGNOSIS — Z6832 Body mass index (BMI) 32.0-32.9, adult: Secondary | ICD-10-CM | POA: Diagnosis not present

## 2022-02-25 DIAGNOSIS — Z9842 Cataract extraction status, left eye: Secondary | ICD-10-CM | POA: Diagnosis not present

## 2022-02-25 DIAGNOSIS — D122 Benign neoplasm of ascending colon: Secondary | ICD-10-CM | POA: Diagnosis present

## 2022-02-25 DIAGNOSIS — R310 Gross hematuria: Secondary | ICD-10-CM

## 2022-02-25 DIAGNOSIS — I129 Hypertensive chronic kidney disease with stage 1 through stage 4 chronic kidney disease, or unspecified chronic kidney disease: Secondary | ICD-10-CM | POA: Diagnosis present

## 2022-02-25 DIAGNOSIS — I700 Atherosclerosis of aorta: Secondary | ICD-10-CM | POA: Diagnosis present

## 2022-02-25 DIAGNOSIS — Z9841 Cataract extraction status, right eye: Secondary | ICD-10-CM | POA: Diagnosis not present

## 2022-02-25 DIAGNOSIS — K635 Polyp of colon: Secondary | ICD-10-CM | POA: Diagnosis not present

## 2022-02-25 DIAGNOSIS — Z79899 Other long term (current) drug therapy: Secondary | ICD-10-CM | POA: Diagnosis not present

## 2022-02-25 DIAGNOSIS — D62 Acute posthemorrhagic anemia: Secondary | ICD-10-CM

## 2022-02-25 DIAGNOSIS — R195 Other fecal abnormalities: Secondary | ICD-10-CM | POA: Diagnosis not present

## 2022-02-25 DIAGNOSIS — D509 Iron deficiency anemia, unspecified: Secondary | ICD-10-CM | POA: Diagnosis not present

## 2022-02-25 DIAGNOSIS — E782 Mixed hyperlipidemia: Secondary | ICD-10-CM

## 2022-02-25 DIAGNOSIS — N1831 Chronic kidney disease, stage 3a: Secondary | ICD-10-CM | POA: Diagnosis present

## 2022-02-25 DIAGNOSIS — D649 Anemia, unspecified: Secondary | ICD-10-CM | POA: Diagnosis present

## 2022-02-25 DIAGNOSIS — Z96642 Presence of left artificial hip joint: Secondary | ICD-10-CM | POA: Diagnosis present

## 2022-02-25 DIAGNOSIS — K922 Gastrointestinal hemorrhage, unspecified: Secondary | ICD-10-CM

## 2022-02-25 DIAGNOSIS — E785 Hyperlipidemia, unspecified: Secondary | ICD-10-CM | POA: Diagnosis present

## 2022-02-25 DIAGNOSIS — Z1212 Encounter for screening for malignant neoplasm of rectum: Secondary | ICD-10-CM | POA: Diagnosis not present

## 2022-02-25 DIAGNOSIS — Z7902 Long term (current) use of antithrombotics/antiplatelets: Secondary | ICD-10-CM | POA: Diagnosis not present

## 2022-02-25 DIAGNOSIS — K640 First degree hemorrhoids: Secondary | ICD-10-CM | POA: Diagnosis present

## 2022-02-25 DIAGNOSIS — K219 Gastro-esophageal reflux disease without esophagitis: Secondary | ICD-10-CM | POA: Diagnosis present

## 2022-02-25 DIAGNOSIS — Z1211 Encounter for screening for malignant neoplasm of colon: Secondary | ICD-10-CM | POA: Diagnosis not present

## 2022-02-25 DIAGNOSIS — K31811 Angiodysplasia of stomach and duodenum with bleeding: Secondary | ICD-10-CM | POA: Diagnosis present

## 2022-02-25 DIAGNOSIS — Z8249 Family history of ischemic heart disease and other diseases of the circulatory system: Secondary | ICD-10-CM | POA: Diagnosis not present

## 2022-02-25 DIAGNOSIS — Z7982 Long term (current) use of aspirin: Secondary | ICD-10-CM | POA: Diagnosis not present

## 2022-02-25 DIAGNOSIS — E669 Obesity, unspecified: Secondary | ICD-10-CM | POA: Diagnosis present

## 2022-02-25 HISTORY — PX: ESOPHAGOGASTRODUODENOSCOPY (EGD) WITH PROPOFOL: SHX5813

## 2022-02-25 LAB — CBC
HCT: 26.1 % — ABNORMAL LOW (ref 39.0–52.0)
Hemoglobin: 8.2 g/dL — ABNORMAL LOW (ref 13.0–17.0)
MCH: 26.4 pg (ref 26.0–34.0)
MCHC: 31.4 g/dL (ref 30.0–36.0)
MCV: 83.9 fL (ref 80.0–100.0)
Platelets: 179 10*3/uL (ref 150–400)
RBC: 3.11 MIL/uL — ABNORMAL LOW (ref 4.22–5.81)
RDW: 16.9 % — ABNORMAL HIGH (ref 11.5–15.5)
WBC: 7.1 10*3/uL (ref 4.0–10.5)
nRBC: 0 % (ref 0.0–0.2)

## 2022-02-25 LAB — VITAMIN B12: Vitamin B-12: 198 pg/mL (ref 180–914)

## 2022-02-25 LAB — IRON AND TIBC
Iron: 19 ug/dL — ABNORMAL LOW (ref 45–182)
Saturation Ratios: 5 % — ABNORMAL LOW (ref 17.9–39.5)
TIBC: 421 ug/dL (ref 250–450)
UIBC: 402 ug/dL

## 2022-02-25 LAB — FOLATE: Folate: 8.4 ng/mL (ref >5.9)

## 2022-02-25 LAB — PREPARE RBC (CROSSMATCH)

## 2022-02-25 SURGERY — ESOPHAGOGASTRODUODENOSCOPY (EGD) WITH PROPOFOL
Anesthesia: Monitor Anesthesia Care

## 2022-02-25 MED ORDER — ATORVASTATIN CALCIUM 10 MG PO TABS
10.0000 mg | ORAL_TABLET | Freq: Every day | ORAL | Status: DC
  Administered 2022-02-25 – 2022-02-27 (×2): 10 mg via ORAL
  Filled 2022-02-25 (×2): qty 1

## 2022-02-25 MED ORDER — SODIUM CHLORIDE 0.9% IV SOLUTION
Freq: Once | INTRAVENOUS | Status: AC
Start: 2022-02-25 — End: 2022-02-27

## 2022-02-25 MED ORDER — ACETAMINOPHEN 650 MG RE SUPP: 650.0000 mg | Freq: Four times a day (QID) | RECTAL | Status: DC | PRN

## 2022-02-25 MED ORDER — LIDOCAINE 2% (20 MG/ML) 5 ML SYRINGE
INTRAMUSCULAR | Status: DC | PRN
  Administered 2022-02-25 (×2): 40 mg via INTRAVENOUS

## 2022-02-25 MED ORDER — ONDANSETRON HCL 4 MG PO TABS: 4.0000 mg | ORAL_TABLET | Freq: Four times a day (QID) | ORAL | Status: DC | PRN

## 2022-02-25 MED ORDER — SODIUM CHLORIDE 0.9 % IV SOLN: INTRAVENOUS | Status: DC

## 2022-02-25 MED ORDER — PHENYLEPHRINE HCL-NACL 20-0.9 MG/250ML-% IV SOLN
INTRAVENOUS | Status: DC | PRN
  Administered 2022-02-25: 25 ug/min via INTRAVENOUS

## 2022-02-25 MED ORDER — TERAZOSIN HCL 5 MG PO CAPS
5.0000 mg | ORAL_CAPSULE | Freq: Every day | ORAL | Status: DC
  Administered 2022-02-25 – 2022-02-26 (×3): 5 mg via ORAL
  Filled 2022-02-25 (×3): qty 1

## 2022-02-25 MED ORDER — LACTATED RINGERS IV SOLN: INTRAVENOUS | Status: DC | PRN

## 2022-02-25 MED ORDER — PEG 3350-KCL-NA BICARB-NACL 420 G PO SOLR
4000.0000 mL | Freq: Once | ORAL | Status: AC
  Administered 2022-02-25: 4000 mL via ORAL
  Filled 2022-02-25: qty 4000

## 2022-02-25 MED ORDER — LACTATED RINGERS IV SOLN
INTRAVENOUS | Status: AC | PRN
  Administered 2022-02-25: 1000 mL via INTRAVENOUS

## 2022-02-25 MED ORDER — ACETAMINOPHEN 325 MG PO TABS: 650.0000 mg | ORAL_TABLET | Freq: Four times a day (QID) | ORAL | Status: DC | PRN

## 2022-02-25 MED ORDER — PROPOFOL 500 MG/50ML IV EMUL
INTRAVENOUS | Status: DC | PRN
  Administered 2022-02-25: 150 ug/kg/min via INTRAVENOUS

## 2022-02-25 MED ORDER — ONDANSETRON HCL 4 MG/2ML IJ SOLN: 4.0000 mg | Freq: Four times a day (QID) | INTRAMUSCULAR | Status: DC | PRN

## 2022-02-25 MED ORDER — PROPOFOL 10 MG/ML IV BOLUS
INTRAVENOUS | Status: DC | PRN
  Administered 2022-02-25: 20 mg via INTRAVENOUS

## 2022-02-25 MED ORDER — METOPROLOL TARTRATE 25 MG PO TABS
12.5000 mg | ORAL_TABLET | Freq: Two times a day (BID) | ORAL | Status: DC
  Administered 2022-02-25 – 2022-02-27 (×3): 12.5 mg via ORAL
  Filled 2022-02-25 (×5): qty 1

## 2022-02-25 MED ORDER — SODIUM CHLORIDE 0.9 % IV SOLN
1.0000 g | INTRAVENOUS | Status: DC
  Administered 2022-02-25 – 2022-02-27 (×3): 1 g via INTRAVENOUS
  Filled 2022-02-25 (×3): qty 10

## 2022-02-25 MED ORDER — FUROSEMIDE 10 MG/ML IJ SOLN
20.0000 mg | Freq: Once | INTRAMUSCULAR | Status: AC
  Administered 2022-02-25: 20 mg via INTRAVENOUS
  Filled 2022-02-25: qty 2

## 2022-02-25 SURGICAL SUPPLY — 15 items

## 2022-02-25 NOTE — H&P (View-Only) (Signed)
UNASSIGNED CONSULT  Reason for Consult: Anemia and heme positive stool Referring Physician: [facility]  Baron Sane HPI: This is a 88 year old male with a PMH of CAD, carotid stenosis on Plavix, CVA, GERD, HTN, and bladder cancer admitted for hematuria and an anemia.  His HGB on admission was at 5.9 g/dL and his baseline is around 12-13 g/dL.  A hemoccult was positive.  There were no reports of hematochezia or melena, but his daughter noted some dark stool of late.  The patient denies any problems with abdominal pain or GERD.  He had a colonoscopy many years ago and he believes it was normal.  Past Medical History:  Diagnosis Date   AAA (abdominal aortic aneurysm) ([place])    a. 05/2014 s/p stent grafting [facility]).   Bronchitis    CAD (coronary artery disease)    a. 06/2013 VF Arrest/Cath: 3VD, EF 50%;  b. 06/2013 CABG x 4: LIMA->LAD, VG->PDA->RPL, VG->OM.   CVA (cerebral infarction)    Erectile dysfunction    Essential hypertension    GERD (gastroesophageal reflux disease)    H/O hiatal hernia    HOH (hard of hearing)    wears hearing aid in right ear   Hyperlipidemia    Osteoarthritis    Pneumonia    YRS AGO   Stroke [facility])     Past Surgical History:  Procedure Laterality Date   CORONARY ARTERY BYPASS GRAFT N/A 06/27/2013   Procedure: CORONARY ARTERY BYPASS GRAFTING (CABG);  Surgeon: Ivin Poot, MD;  Location: [place];  Service: Open Heart Surgery;  Laterality: N/A;  times four utilizing the left internal mammary artery and the right greater saphenous vein harvested endoscopically   ENDOVEIN HARVEST OF GREATER SAPHENOUS VEIN Right 06/27/2013   Procedure: ENDOVEIN HARVEST OF GREATER SAPHENOUS VEIN;  Surgeon: Ivin Poot, MD;  Location: [employer];  Service: Open Heart Surgery;  Laterality: Right;   EYE SURGERY     bilateral cataracts   INNER EAR SURGERY Left 1982   INNER EAR SURGERY     severed a nerve in his left ear.    LEFT HEART CATHETERIZATION WITH CORONARY  ANGIOGRAM N/A 06/27/2013   Procedure: LEFT HEART CATHETERIZATION WITH CORONARY ANGIOGRAM;  Surgeon: Sinclair Grooms, MD;  Location: [facility] CATH LAB;  Service: Cardiovascular;  Laterality: N/A;  iabp insertion, cpr, intubation, emergent cvts   TEE WITHOUT CARDIOVERSION N/A 06/27/2013   Procedure: TRANSESOPHAGEAL ECHOCARDIOGRAM (TEE);  Surgeon: Ivin Poot, MD;  Location: [place];  Service: Open Heart Surgery;  Laterality: N/A;   TONSILLECTOMY     TOTAL HIP ARTHROPLASTY Left 09/01/2014   Procedure: TOTAL HIP ARTHROPLASTY;  Surgeon: Kerin Salen, MD;  Location: [place];  Service: Orthopedics;  Laterality: Left;    Family History  Problem Relation Age of Onset   Heart disease Sister    Heart disease Brother    Heart attack Sister    Heart attack Brother    Colon cancer Neg Hx    Hypertension Neg Hx    Stroke Neg Hx     Social History:  reports that he has never smoked. He has never used smokeless tobacco. He reports current alcohol use. He reports that he does not use drugs.  Allergies: No Known Allergies  Medications: Scheduled:  sodium chloride   Intravenous Once   atorvastatin  10 mg Oral Daily   metoprolol tartrate  12.5 mg Oral BID   terazosin  5 mg Oral QHS   Continuous:  cefTRIAXone (ROCEPHIN)  IV 1 g (02/25/22 0140)   pantoprazole 8 mg/hr (02/24/22 2231)    Results for orders placed or performed during the hospital encounter of 02/24/22 (from the past 24 hour(s))  Occult blood card to lab, stool     Status: Abnormal   Collection Time: 02/24/22  9:00 PM  Result Value Ref Range   Fecal Occult Bld POSITIVE (A) NEGATIVE  CBC with Differential     Status: Abnormal   Collection Time: 02/24/22  9:06 PM  Result Value Ref Range   WBC 8.1 4.0 - 10.5 K/uL   RBC 2.41 (L) 4.22 - 5.81 MIL/uL   Hemoglobin 5.9 (LL) 13.0 - 17.0 g/dL   HCT 19.8 (L) 39.0 - 52.0 %   MCV 82.2 80.0 - 100.0 fL   MCH 24.5 (L) 26.0 - 34.0 pg   MCHC 29.8 (L) 30.0 - 36.0 g/dL   RDW 17.1 (H) 11.5 - 15.5 %    Platelets 188 150 - 400 K/uL   nRBC 0.0 0.0 - 0.2 %   Neutrophils Relative % 66 %   Neutro Abs 5.3 1.7 - 7.7 K/uL   Lymphocytes Relative 23 %   Lymphs Abs 1.9 0.7 - 4.0 K/uL   Monocytes Relative 9 %   Monocytes Absolute 0.7 0.1 - 1.0 K/uL   Eosinophils Relative 1 %   Eosinophils Absolute 0.1 0.0 - 0.5 K/uL   Basophils Relative 1 %   Basophils Absolute 0.1 0.0 - 0.1 K/uL   Immature Granulocytes 0 %   Abs Immature Granulocytes 0.02 0.00 - 0.07 K/uL  Basic metabolic panel     Status: Abnormal   Collection Time: 02/24/22  9:06 PM  Result Value Ref Range   Sodium 141 135 - 145 mmol/L   Potassium 4.3 3.5 - 5.1 mmol/L   Chloride 107 98 - 111 mmol/L   CO2 24 22 - 32 mmol/L   Glucose, Bld 114 (H) 70 - 99 mg/dL   BUN 33 (H) 8 - 23 mg/dL   Creatinine, Ser 1.41 (H) 0.61 - 1.24 mg/dL   Calcium 9.1 8.9 - 10.3 mg/dL   GFR, Estimated 47 (L) >60 mL/min   Anion gap 10 5 - 15  Ferritin     Status: Abnormal   Collection Time: 02/24/22  9:06 PM  Result Value Ref Range   Ferritin 11 (L) 24 - 336 ng/mL  Reticulocytes     Status: Abnormal   Collection Time: 02/24/22  9:06 PM  Result Value Ref Range   Retic Ct Pct 3.7 (H) 0.4 - 3.1 %   RBC. 2.39 (L) 4.22 - 5.81 MIL/uL   Retic Count, Absolute 88.9 19.0 - 186.0 K/uL   Immature Retic Fract 33.3 (H) 2.3 - 15.9 %  Urinalysis, Routine w reflex microscopic Urine, Clean Catch     Status: Abnormal   Collection Time: 02/24/22  9:33 PM  Result Value Ref Range   Color, Urine BROWN (A) YELLOW   APPearance CLOUDY (A) CLEAR   Specific Gravity, Urine 1.020 1.005 - 1.030   pH 6.0 5.0 - 8.0   Glucose, UA NEGATIVE NEGATIVE mg/dL   Hgb urine dipstick LARGE (A) NEGATIVE   Bilirubin Urine NEGATIVE NEGATIVE   Ketones, ur NEGATIVE NEGATIVE mg/dL   Protein, ur 100 (A) NEGATIVE mg/dL   Nitrite NEGATIVE NEGATIVE   Leukocytes,Ua SMALL (A) NEGATIVE   RBC / HPF >50 (H) 0 - 5 RBC/hpf   WBC, UA 21-50 0 - 5 WBC/hpf   Bacteria, UA RARE (  A) NONE SEEN   Mucus PRESENT    Type and screen [place]     Status: None (Preliminary result)   Collection Time: 02/25/22  1:04 AM  Result Value Ref Range   ABO/RH(D) A NEG    Antibody Screen NEG    Sample Expiration 02/28/2022,2359    Unit Number [id number]    Blood Component Type RED CELLS,LR    Unit division 00    Status of Unit ISSUED    Transfusion Status OK TO TRANSFUSE    Crossmatch Result Compatible    Unit Number [id number]    Blood Component Type RED CELLS,LR    Unit division 00    Status of Unit ISSUED    Transfusion Status OK TO TRANSFUSE    Crossmatch Result      Compatible Performed at [facility], [place] [address] [place], [place] [address]   Prepare RBC (crossmatch)     Status: None   Collection Time: 02/25/22  1:04 AM  Result Value Ref Range   Order Confirmation      ORDER PROCESSED BY BLOOD BANK Performed at [facility], [place] [address] [place], [place] [address]      No results found.  ROS:  As stated above in the HPI otherwise negative.  Blood pressure 103/64, pulse 66, temperature 97.8 F (36.6 C), temperature source Oral, resp. rate 18, weight 102.5 kg, SpO2 97 %.    PE: Gen: NAD, Alert and Oriented HEENT:  [place]/AT, EOMI Neck: Supple, no LAD Lungs: CTA Bilaterally CV: RRR without M/G/R ABD: Soft, NTND, +BS Ext: No C/C/E  Assessment/Plan: 1) Anemia. 2) Heme positive stool. 3) Hematuria. 4) History of bladder cancer.   The patient's anemia appears to be multifactorial.  But further work up with an EGD will be performed.  Pending the findings, further recommendations will be made.  He may require a colonoscopy if the EGD is negative for any source of bleeding.  Plan: 1) EGD today.  Jenin Birdsall D 02/25/2022, 8:14 AM

## 2022-02-25 NOTE — Assessment & Plan Note (Signed)
Admit to observation status bed.  Patient is not a Jehovah's Witness.  He agrees to blood transfusion.  We will transfuse with 2 units of packed red blood cells.  Repeat hemoglobin after transfusion completed.  We will give 1 dose of IV Lasix in between units of blood.

## 2022-02-25 NOTE — Assessment & Plan Note (Addendum)
Patient has heme positive stools.  Dr. Benson Norway with GI was consulted by the ED. GI bleeding could be a cause of his acute blood loss anemia.

## 2022-02-25 NOTE — Plan of Care (Signed)
  Problem: Education: Goal: Knowledge of General Education information will improve Description: Including pain rating scale, medication(s)/side effects and non-pharmacologic comfort measures Outcome: Progressing   Problem: Skin Integrity: Goal: Risk for impaired skin integrity will decrease Outcome: Progressing   Problem: Education: Goal: Knowledge of General Education information will improve Description: Including pain rating scale, medication(s)/side effects and non-pharmacologic comfort measures Outcome: Progressing

## 2022-02-25 NOTE — Transfer of Care (Signed)
Immediate Anesthesia Transfer of Care Note  Patient: Tyler [name]  Procedure(s) Performed: ESOPHAGOGASTRODUODENOSCOPY (EGD) WITH PROPOFOL  Patient Location: PACU and Endoscopy Unit  Anesthesia Type:MAC  Level of Consciousness: awake and alert   Airway & Oxygen Therapy: Patient Spontanous Breathing and Patient connected to face mask oxygen  Post-op Assessment: Report given to RN and Post -op Vital signs reviewed and stable  Post vital signs: Reviewed and stable  Last Vitals:  Vitals Value Taken Time  BP    Temp    Pulse 71 02/25/22 1347  Resp 19 02/25/22 1347  SpO2 98 % 02/25/22 1347  Vitals shown include unvalidated device data.  Last Pain:  Vitals:   02/25/22 1239  TempSrc: Temporal  PainSc: 0-No pain         Complications: No notable events documented.

## 2022-02-25 NOTE — Anesthesia Postprocedure Evaluation (Signed)
Anesthesia Post Note  Patient: Jaiyon Wander  Procedure(s) Performed: ESOPHAGOGASTRODUODENOSCOPY (EGD) WITH PROPOFOL     Patient location during evaluation: PACU Anesthesia Type: MAC Level of consciousness: awake and alert and oriented Pain management: pain level controlled Vital Signs Assessment: post-procedure vital signs reviewed and stable Respiratory status: spontaneous breathing, nonlabored ventilation and respiratory function stable Cardiovascular status: stable and blood pressure returned to baseline Postop Assessment: no apparent nausea or vomiting Anesthetic complications: no   No notable events documented.  Last Vitals:  Vitals:   02/25/22 1400 02/25/22 1410  BP: (!) 129/50 (!) 124/55  Pulse: 70 67  Resp: 15 17  Temp:    SpO2: 94% 95%    Last Pain:  Vitals:   02/25/22 1410  TempSrc:   PainSc: 0-No pain                 Jewelz Kobus A.

## 2022-02-25 NOTE — Assessment & Plan Note (Addendum)
Stable.  Hold his aspirin for now given his hematuria and GI bleeding.

## 2022-02-25 NOTE — Consult Note (Signed)
UNASSIGNED CONSULT  Reason for Consult: Anemia and heme positive stool Referring Physician: [facility]  Baron Sane HPI: This is a 88 year old male with a PMH of CAD, carotid stenosis on Plavix, CVA, GERD, HTN, and bladder cancer admitted for hematuria and an anemia.  His HGB on admission was at 5.9 g/dL and his baseline is around 12-13 g/dL.  A hemoccult was positive.  There were no reports of hematochezia or melena, but his daughter noted some dark stool of late.  The patient denies any problems with abdominal pain or GERD.  He had a colonoscopy many years ago and he believes it was normal.  Past Medical History:  Diagnosis Date   AAA (abdominal aortic aneurysm) ([place])    a. 05/2014 s/p stent grafting [facility]).   Bronchitis    CAD (coronary artery disease)    a. 06/2013 VF Arrest/Cath: 3VD, EF 50%;  b. 06/2013 CABG x 4: LIMA->LAD, VG->PDA->RPL, VG->OM.   CVA (cerebral infarction)    Erectile dysfunction    Essential hypertension    GERD (gastroesophageal reflux disease)    H/O hiatal hernia    HOH (hard of hearing)    wears hearing aid in right ear   Hyperlipidemia    Osteoarthritis    Pneumonia    YRS AGO   Stroke [facility])     Past Surgical History:  Procedure Laterality Date   CORONARY ARTERY BYPASS GRAFT N/A 06/27/2013   Procedure: CORONARY ARTERY BYPASS GRAFTING (CABG);  Surgeon: Ivin Poot, MD;  Location: [place];  Service: Open Heart Surgery;  Laterality: N/A;  times four utilizing the left internal mammary artery and the right greater saphenous vein harvested endoscopically   ENDOVEIN HARVEST OF GREATER SAPHENOUS VEIN Right 06/27/2013   Procedure: ENDOVEIN HARVEST OF GREATER SAPHENOUS VEIN;  Surgeon: Ivin Poot, MD;  Location: [place];  Service: Open Heart Surgery;  Laterality: Right;   EYE SURGERY     bilateral cataracts   INNER EAR SURGERY Left 1982   INNER EAR SURGERY     severed a nerve in his left ear.    LEFT HEART CATHETERIZATION WITH CORONARY  ANGIOGRAM N/A 06/27/2013   Procedure: LEFT HEART CATHETERIZATION WITH CORONARY ANGIOGRAM;  Surgeon: Sinclair Grooms, MD;  Location: [facility] CATH LAB;  Service: Cardiovascular;  Laterality: N/A;  iabp insertion, cpr, intubation, emergent cvts   TEE WITHOUT CARDIOVERSION N/A 06/27/2013   Procedure: TRANSESOPHAGEAL ECHOCARDIOGRAM (TEE);  Surgeon: Ivin Poot, MD;  Location: [place];  Service: Open Heart Surgery;  Laterality: N/A;   TONSILLECTOMY     TOTAL HIP ARTHROPLASTY Left 09/01/2014   Procedure: TOTAL HIP ARTHROPLASTY;  Surgeon: Kerin Salen, MD;  Location: [place];  Service: Orthopedics;  Laterality: Left;    Family History  Problem Relation Age of Onset   Heart disease Sister    Heart disease Brother    Heart attack Sister    Heart attack Brother    Colon cancer Neg Hx    Hypertension Neg Hx    Stroke Neg Hx     Social History:  reports that he has never smoked. He has never used smokeless tobacco. He reports current alcohol use. He reports that he does not use drugs.  Allergies: No Known Allergies  Medications: Scheduled:  sodium chloride   Intravenous Once   atorvastatin  10 mg Oral Daily   metoprolol tartrate  12.5 mg Oral BID   terazosin  5 mg Oral QHS   Continuous:  cefTRIAXone (ROCEPHIN)  IV 1 g (02/25/22 0140)   pantoprazole 8 mg/hr (02/24/22 2231)    Results for orders placed or performed during the hospital encounter of 02/24/22 (from the past 24 hour(s))  Occult blood card to lab, stool     Status: Abnormal   Collection Time: 02/24/22  9:00 PM  Result Value Ref Range   Fecal Occult Bld POSITIVE (A) NEGATIVE  CBC with Differential     Status: Abnormal   Collection Time: 02/24/22  9:06 PM  Result Value Ref Range   WBC 8.1 4.0 - 10.5 K/uL   RBC 2.41 (L) 4.22 - 5.81 MIL/uL   Hemoglobin 5.9 (LL) 13.0 - 17.0 g/dL   HCT 19.8 (L) 39.0 - 52.0 %   MCV 82.2 80.0 - 100.0 fL   MCH 24.5 (L) 26.0 - 34.0 pg   MCHC 29.8 (L) 30.0 - 36.0 g/dL   RDW 17.1 (H) 11.5 - 15.5 %    Platelets 188 150 - 400 K/uL   nRBC 0.0 0.0 - 0.2 %   Neutrophils Relative % 66 %   Neutro Abs 5.3 1.7 - 7.7 K/uL   Lymphocytes Relative 23 %   Lymphs Abs 1.9 0.7 - 4.0 K/uL   Monocytes Relative 9 %   Monocytes Absolute 0.7 0.1 - 1.0 K/uL   Eosinophils Relative 1 %   Eosinophils Absolute 0.1 0.0 - 0.5 K/uL   Basophils Relative 1 %   Basophils Absolute 0.1 0.0 - 0.1 K/uL   Immature Granulocytes 0 %   Abs Immature Granulocytes 0.02 0.00 - 0.07 K/uL  Basic metabolic panel     Status: Abnormal   Collection Time: 02/24/22  9:06 PM  Result Value Ref Range   Sodium 141 135 - 145 mmol/L   Potassium 4.3 3.5 - 5.1 mmol/L   Chloride 107 98 - 111 mmol/L   CO2 24 22 - 32 mmol/L   Glucose, Bld 114 (H) 70 - 99 mg/dL   BUN 33 (H) 8 - 23 mg/dL   Creatinine, Ser 1.41 (H) 0.61 - 1.24 mg/dL   Calcium 9.1 8.9 - 10.3 mg/dL   GFR, Estimated 47 (L) >60 mL/min   Anion gap 10 5 - 15  Ferritin     Status: Abnormal   Collection Time: 02/24/22  9:06 PM  Result Value Ref Range   Ferritin 11 (L) 24 - 336 ng/mL  Reticulocytes     Status: Abnormal   Collection Time: 02/24/22  9:06 PM  Result Value Ref Range   Retic Ct Pct 3.7 (H) 0.4 - 3.1 %   RBC. 2.39 (L) 4.22 - 5.81 MIL/uL   Retic Count, Absolute 88.9 19.0 - 186.0 K/uL   Immature Retic Fract 33.3 (H) 2.3 - 15.9 %  Urinalysis, Routine w reflex microscopic Urine, Clean Catch     Status: Abnormal   Collection Time: 02/24/22  9:33 PM  Result Value Ref Range   Color, Urine BROWN (A) YELLOW   APPearance CLOUDY (A) CLEAR   Specific Gravity, Urine 1.020 1.005 - 1.030   pH 6.0 5.0 - 8.0   Glucose, UA NEGATIVE NEGATIVE mg/dL   Hgb urine dipstick LARGE (A) NEGATIVE   Bilirubin Urine NEGATIVE NEGATIVE   Ketones, ur NEGATIVE NEGATIVE mg/dL   Protein, ur 100 (A) NEGATIVE mg/dL   Nitrite NEGATIVE NEGATIVE   Leukocytes,Ua SMALL (A) NEGATIVE   RBC / HPF >50 (H) 0 - 5 RBC/hpf   WBC, UA 21-50 0 - 5 WBC/hpf   Bacteria, UA RARE (  A) NONE SEEN   Mucus PRESENT    Type and screen [place]     Status: None (Preliminary result)   Collection Time: 02/25/22  1:04 AM  Result Value Ref Range   ABO/RH(D) A NEG    Antibody Screen NEG    Sample Expiration 02/28/2022,2359    Unit Number [id number]    Blood Component Type RED CELLS,LR    Unit division 00    Status of Unit ISSUED    Transfusion Status OK TO TRANSFUSE    Crossmatch Result Compatible    Unit Number [id number]    Blood Component Type RED CELLS,LR    Unit division 00    Status of Unit ISSUED    Transfusion Status OK TO TRANSFUSE    Crossmatch Result      Compatible Performed at [facility], [place] [address] [place], [place] [address]   Prepare RBC (crossmatch)     Status: None   Collection Time: 02/25/22  1:04 AM  Result Value Ref Range   Order Confirmation      ORDER PROCESSED BY BLOOD BANK Performed at [facility], [place] [address], [place], [place] [address]      No results found.  ROS:  As stated above in the HPI otherwise negative.  Blood pressure 103/64, pulse 66, temperature 97.8 F (36.6 C), temperature source Oral, resp. rate 18, weight 102.5 kg, SpO2 97 %.    PE: Gen: NAD, Alert and Oriented HEENT:  [place]/AT, EOMI Neck: Supple, no LAD Lungs: CTA Bilaterally CV: RRR without M/G/R ABD: Soft, NTND, +BS Ext: No C/C/E  Assessment/Plan: 1) Anemia. 2) Heme positive stool. 3) Hematuria. 4) History of bladder cancer.   The patient's anemia appears to be multifactorial.  But further work up with an EGD will be performed.  Pending the findings, further recommendations will be made.  He may require a colonoscopy if the EGD is negative for any source of bleeding.  Plan: 1) EGD today.  Kiahna Banghart D 02/25/2022, 8:14 AM

## 2022-02-25 NOTE — Assessment & Plan Note (Signed)
Chronic.  Patient to have cystoscopy by his urologist soon.

## 2022-02-25 NOTE — Assessment & Plan Note (Signed)
Stable

## 2022-02-25 NOTE — H&P (Signed)
History and Physical    Tyler [name] SWF:[id number] DOB: May 13, 1929 DOA: 02/24/2022  DOS: the patient was seen and examined on 02/24/2022  PCP: [name], MD   Patient coming from: Home  I have personally briefly reviewed patient's old medical records in [place]  CC: anemia, hematuria HPI: 88 year old white male with a history of bladder cancer, hyperlipidemia, CKD stage IIIa, coronary disease status post CABG, hypertension presents to the ER today from the PCP office.  Patient been noted to have dyspnea with activity for several days now.  He has had hematuria now for 2 to 3 weeks.  He has been on some antibiotics for history of UTI.  Patient has been recently on Bactrim.  He has been having hematuria for several weeks now.  Daughter states that patient is supposed to have a cystoscopy with bladder scraping due to his history of bladder cancer.  Due to the patient's increasing fatigue and dyspnea exertion, he was taken to the PCP office today.  Blood work showed the patient was anemic and he was sent to the ER for repeat CBC.  In the ER, repeat CBC showed a hemoglobin of 5.9.  Patient was recently started on Plavix in June 2023 due to carotid stenosis.  Patient noted to have heme positive stools.  Due to the patient's anemia, [facility] hospitalist contacted for admission.   ED Course: Repeat CBC shows a hemoglobin of 5.9.  Review of Systems:  Review of Systems  Constitutional:  Positive for malaise/fatigue.  HENT: Negative.    Eyes: Negative.   Respiratory: Negative.    Cardiovascular:        Dyspnea on exertion  Gastrointestinal: Negative.   Genitourinary:  Positive for hematuria.  Musculoskeletal: Negative.   Skin: Negative.   Neurological: Negative.   Endo/Heme/Allergies: Negative.   Psychiatric/Behavioral: Negative.    All other systems reviewed and are negative.   Past Medical History:  Diagnosis Date   AAA (abdominal aortic aneurysm) ([place])    a.  05/2014 s/p stent grafting [facility]).   Bronchitis    CAD (coronary artery disease)    a. 06/2013 VF Arrest/Cath: 3VD, EF 50%;  b. 06/2013 CABG x 4: LIMA->LAD, VG->PDA->RPL, VG->OM.   CVA (cerebral infarction)    Erectile dysfunction    Essential hypertension    GERD (gastroesophageal reflux disease)    H/O hiatal hernia    HOH (hard of hearing)    wears hearing aid in right ear   Hyperlipidemia    Osteoarthritis    Pneumonia    YRS AGO   Stroke [facility])     Past Surgical History:  Procedure Laterality Date   CORONARY ARTERY BYPASS GRAFT N/A 06/27/2013   Procedure: CORONARY ARTERY BYPASS GRAFTING (CABG);  Surgeon: Ivin Poot, MD;  Location: [place];  Service: Open Heart Surgery;  Laterality: N/A;  times four utilizing the left internal mammary artery and the right greater saphenous vein harvested endoscopically   ENDOVEIN HARVEST OF GREATER SAPHENOUS VEIN Right 06/27/2013   Procedure: ENDOVEIN HARVEST OF GREATER SAPHENOUS VEIN;  Surgeon: Ivin Poot, MD;  Location: [address];  Service: Open Heart Surgery;  Laterality: Right;   EYE SURGERY     bilateral cataracts   INNER EAR SURGERY Left 1982   INNER EAR SURGERY     severed a nerve in his left ear.    LEFT HEART CATHETERIZATION WITH CORONARY ANGIOGRAM N/A 06/27/2013   Procedure: LEFT HEART CATHETERIZATION WITH CORONARY ANGIOGRAM;  Surgeon: Belva Crome  III, MD;  Location: [place] CATH LAB;  Service: Cardiovascular;  Laterality: N/A;  iabp insertion, cpr, intubation, emergent cvts   TEE WITHOUT CARDIOVERSION N/A 06/27/2013   Procedure: TRANSESOPHAGEAL ECHOCARDIOGRAM (TEE);  Surgeon: Ivin Poot, MD;  Location: [place];  Service: Open Heart Surgery;  Laterality: N/A;   TONSILLECTOMY     TOTAL HIP ARTHROPLASTY Left 09/01/2014   Procedure: TOTAL HIP ARTHROPLASTY;  Surgeon: Kerin Salen, MD;  Location: [place];  Service: Orthopedics;  Laterality: Left;     reports that he has never smoked. He has never used smokeless tobacco. He reports  current alcohol use. He reports that he does not use drugs.  No Active Allergies  Family History  Problem Relation Age of Onset   Heart disease Sister    Heart disease Brother    Heart attack Sister    Heart attack Brother    Colon cancer Neg Hx    Hypertension Neg Hx    Stroke Neg Hx     Prior to Admission medications   Medication Sig Start Date End Date Taking? Authorizing Provider  aspirin EC 81 MG tablet Take 1 tablet (81 mg total) by mouth daily. Swallow whole. Patient taking differently: Take 81 mg by mouth at bedtime. Swallow whole. 06/03/20   Burtis Junes, NP  atorvastatin (LIPITOR) 10 MG tablet Take 10 mg by mouth daily.    [provider]  dextromethorphan (DELSYM) 30 MG/5ML liquid Take 2.5 mLs (15 mg total) by mouth 2 (two) times daily. 03/30/21   Sanjuan Dame, MD  metoprolol tartrate (LOPRESSOR) 25 MG tablet Take 0.5 tablets (12.5 mg total) by mouth 2 (two) times daily. 08/29/13   Belva Crome, MD  naproxen sodium (ALEVE) 220 MG tablet Take 220-440 mg by mouth 2 (two) times daily as needed (for back pain).    [provider]  nitroGLYCERIN (NITROSTAT) 0.4 MG SL tablet Place 1 tablet (0.4 mg total) under the tongue every 5 (five) minutes as needed for chest pain. 10/29/21   Belva Crome, MD  rosuvastatin (CRESTOR) 20 MG tablet Take 1 tablet (20 mg total) by mouth daily. 03/30/21 06/28/21  Sanjuan Dame, MD  terazosin (HYTRIN) 5 MG capsule Take 5 mg by mouth at bedtime.    [provider]  traMADol (ULTRAM) 50 MG tablet Take 1 tablet (50 mg total) by mouth every 6 (six) hours as needed for severe pain. 11/15/21   Tegeler, Gwenyth Allegra, MD    Physical Exam: Vitals:   02/24/22 2130 02/24/22 2200 02/24/22 2230 02/24/22 2319  BP: 118/68 133/63 139/72 128/66  Pulse: 72 78 78 66  Resp: '18 19 19 18  '$ Temp:    98 F (36.7 C)  TempSrc:    Oral  SpO2: 99% 99% 98% 99%    Physical Exam Vitals and nursing note reviewed.  Constitutional:       General: He is not in acute distress.    Appearance: Normal appearance. He is not ill-appearing, toxic-appearing or diaphoretic.  HENT:     Head: Normocephalic and atraumatic.     Nose: Nose normal.  Eyes:     General: No scleral icterus. Cardiovascular:     Rate and Rhythm: Normal rate and regular rhythm.     Pulses: Normal pulses.  Pulmonary:     Effort: Pulmonary effort is normal.     Breath sounds: Normal breath sounds. No wheezing or rales.  Abdominal:     General: Bowel sounds are normal. There is no  distension.     Palpations: Abdomen is soft.     Tenderness: There is no abdominal tenderness. There is no guarding or rebound.  Musculoskeletal:     Right lower leg: No edema.     Left lower leg: No edema.  Skin:    General: Skin is warm and dry.     Capillary Refill: Capillary refill takes less than 2 seconds.  Neurological:     General: No focal deficit present.     Mental Status: He is alert and oriented to person, place, and time.      Labs on Admission: I have personally reviewed following labs and imaging studies  CBC: Recent Labs  Lab 02/24/22 2106  WBC 8.1  NEUTROABS 5.3  HGB 5.9*  HCT 19.8*  MCV 82.2  PLT 950   Basic Metabolic Panel: Recent Labs  Lab 02/24/22 2106  NA 141  K 4.3  CL 107  CO2 24  GLUCOSE 114*  BUN 33*  CREATININE 1.41*  CALCIUM 9.1   GFR: CrCl cannot be calculated (Unknown ideal weight.). Liver Function Tests: No results for input(s): "AST", "ALT", "ALKPHOS", "BILITOT", "PROT", "ALBUMIN" in the last 168 hours. No results for input(s): "LIPASE", "AMYLASE" in the last 168 hours. No results for input(s): "AMMONIA" in the last 168 hours. Coagulation Profile: No results for input(s): "INR", "PROTIME" in the last 168 hours. Cardiac Enzymes: No results for input(s): "CKTOTAL", "CKMB", "CKMBINDEX", "TROPONINI", "TROPONINIHS" in the last 168 hours. BNP (last 3 results) No results for input(s): "PROBNP" in the last 8760  hours. HbA1C: No results for input(s): "HGBA1C" in the last 72 hours. CBG: No results for input(s): "GLUCAP" in the last 168 hours. Lipid Profile: No results for input(s): "CHOL", "HDL", "LDLCALC", "TRIG", "CHOLHDL", "LDLDIRECT" in the last 72 hours. Thyroid Function Tests: No results for input(s): "TSH", "T4TOTAL", "FREET4", "T3FREE", "THYROIDAB" in the last 72 hours. Anemia Panel: Recent Labs    02/24/22 2106  FERRITIN 11*  RETICCTPCT 3.7*   Urine analysis:    Component Value Date/Time   COLORURINE BROWN (A) 02/24/2022 2133   APPEARANCEUR CLOUDY (A) 02/24/2022 2133   LABSPEC 1.020 02/24/2022 2133   PHURINE 6.0 02/24/2022 2133   GLUCOSEU NEGATIVE 02/24/2022 2133   HGBUR LARGE (A) 02/24/2022 2133   BILIRUBINUR NEGATIVE 02/24/2022 2133   KETONESUR NEGATIVE 02/24/2022 2133   PROTEINUR 100 (A) 02/24/2022 2133   UROBILINOGEN 0.2 08/22/2014 1559   NITRITE NEGATIVE 02/24/2022 2133   LEUKOCYTESUR SMALL (A) 02/24/2022 2133    Radiological Exams on Admission: I have personally reviewed images No results found.  EKG: My personal interpretation of EKG shows: no EKG  Assessment/Plan Principal Problem:   ABLA (acute blood loss anemia) Active Problems:   GI bleed   Gross hematuria   Malignant neoplasm of bladder, unspecified (HCC)   Hyperlipidemia   Essential hypertension   CAD (coronary artery disease)    Assessment and Plan: * ABLA (acute blood loss anemia) Admit to observation status bed.  Patient is not a Jehovah's Witness.  He agrees to blood transfusion.  We will transfuse with 2 units of packed red blood cells.  Repeat hemoglobin after transfusion completed.  We will give 1 dose of IV Lasix in between units of blood.  Malignant neoplasm of bladder, unspecified (HCC) Chronic.  Patient to have cystoscopy by his urologist soon.  Gross hematuria Patient with gross hematuria for several weeks now.  Could also be the source of his acute blood loss anemia.  We will hold  his  Plavix.  Patient due to see his urologist for cystoscopy.  Daughter states that he frequently has hematuria when he is UTI.  We will continue him on IV Rocephin for now.  GI bleed Patient has heme positive stools.  Dr. Benson Norway with GI was consulted by the ED. GI bleeding could be a cause of his acute blood loss anemia.  CAD (coronary artery disease) Stable.  Hold his aspirin for now given his hematuria and GI bleeding.  Essential hypertension Stable.  Hyperlipidemia Stable.   DVT prophylaxis: SCDs Code Status: Full Code Family Communication: discussed with pt's dtr kathleen at bedside  Disposition Plan: return home  Consults called: EDP has consulted Dr. Benson Norway with GI  Admission status: Observation, Telemetry bed   Kristopher Oppenheim, DO [facility] 02/25/2022, 12:39 AM

## 2022-02-25 NOTE — Subjective & Objective (Signed)
CC: anemia, hematuria HPI: 88 year old white male with a history of bladder cancer, hyperlipidemia, CKD stage IIIa, coronary disease status post CABG, hypertension presents to the ER today from the PCP office.  Patient been noted to have dyspnea with activity for several days now.  He has had hematuria now for 2 to 3 weeks.  He has been on some antibiotics for history of UTI.  Patient has been recently on Bactrim.  He has been having hematuria for several weeks now.  Daughter states that patient is supposed to have a cystoscopy with bladder scraping due to his history of bladder cancer.  Due to the patient's increasing fatigue and dyspnea exertion, he was taken to the PCP office today.  Blood work showed the patient was anemic and he was sent to the ER for repeat CBC.  In the ER, repeat CBC showed a hemoglobin of 5.9.  Patient was recently started on Plavix in June 2023 due to carotid stenosis.  Patient noted to have heme positive stools.  Due to the patient's anemia, [facility] hospitalist contacted for admission.

## 2022-02-25 NOTE — Op Note (Signed)
[facility] Patient Name: Tyler [name] Procedure Date: 02/25/2022 MRN: [id number] Attending MD: Carol Ada , MD Date of Birth: 1929/05/26 CSN: [id number] Age: 88 Admit Type: Inpatient Procedure:                Upper GI endoscopy Indications:              Iron deficiency anemia, Heme positive stool Providers:                Carol Ada, MD, Mikey College, RN, Benetta Spar, Technician Referring MD:              Medicines:                 Complications:            No immediate complications. Estimated Blood Loss:     Estimated blood loss: none. Procedure:                Pre-Anesthesia Assessment:                           - Prior to the procedure, a History and Physical                            was performed, and patient medications and                            allergies were reviewed. The patient's tolerance of                            previous anesthesia was also reviewed. The risks                            and benefits of the procedure and the sedation                            options and risks were discussed with the patient.                            All questions were answered, and informed consent                            was obtained. Prior Anticoagulants: The patient has                            taken Plavix (clopidogrel), last dose was 1 day                            prior to procedure. ASA Grade Assessment: III - A                            patient with severe systemic disease. After  reviewing the risks and benefits, the patient was                            deemed in satisfactory condition to undergo the                            procedure.                           - Sedation was administered by an anesthesia                            professional. Deep sedation was attained.                           After obtaining informed consent, the endoscope was                             passed under direct vision. Throughout the                            procedure, the patient's blood pressure, pulse, and                            oxygen saturations were monitored continuously. The                            [id number]-[id number] ([id number]) Olympus endoscope was introduced                            through the mouth, and advanced to the second part                            of duodenum. The upper GI endoscopy was                            accomplished without difficulty. The patient                            tolerated the procedure well. Scope In: Scope Out: Findings:      The esophagus was normal.      The stomach was normal.      The examined duodenum was normal.      After a lengthy discussion with the patient and his daughter, a       colonoscopy will be pursued tomorrow. Impression:               - Normal esophagus.                           - Normal stomach.                           - Normal examined duodenum.                           - No  specimens collected. Moderate Sedation:      Not Applicable - Patient had care per Anesthesia. Recommendation:           - Return patient to hospital ward for ongoing care.                           - Clear liquid diet.                           - Continue present medications.                           - Prep for a colonoscopy tomorrow with Dr.                            Candis Schatz. Procedure Code(s):        --- Professional ---                           [phone], Esophagogastroduodenoscopy, flexible,                            transoral; diagnostic, including collection of                            specimen(s) by brushing or washing, when performed                            (separate procedure) Diagnosis Code(s):        --- Professional ---                           D50.9, Iron deficiency anemia, unspecified                           R19.5, Other fecal abnormalities CPT copyright 2019 American Medical Association. All rights  reserved. The codes documented in this report are preliminary and upon coder review may  be revised to meet current compliance requirements. Carol Ada, MD Carol Ada, MD 02/25/2022 2:23:02 PM This report has been signed electronically. Number of Addenda: 0

## 2022-02-25 NOTE — TOC Initial Note (Signed)
Transition of Care [facility]) - Initial/Assessment Note    Patient Details  Name: Tyler [name] MRN: [id number] Date of Birth: 1929/05/16  Transition [facility]) CM/SW Contact:    Tyler Phi, RN Phone Number: 02/25/2022, 2:08 PM  Clinical Narrative: Spoke to spouse about d/c plans-home;wants medicare to be used as insurance-admitting notified. Asked attending if PT order needed.                  Expected Discharge Plan: [place] Barriers to Discharge: Continued Medical Work up   Patient Goals and CMS Choice Patient states their goals for this hospitalization and ongoing recovery are:: Home CMS Medicare.gov Compare Post Acute Care list provided to:: Patient Represenative (must comment) Tyler [name] (spouse)) Choice offered to / list presented to : Spouse  Expected Discharge Plan and Services Expected Discharge Plan: [place]   Discharge Planning Services: CM Consult Post Acute Care Choice: [place] arrangements for the past 2 months: Single Family Home                                      Prior Living Arrangements/Services Living arrangements for the past 2 months: Single Family Home Lives with:: Spouse Patient language and need for interpreter reviewed:: Yes Do you feel safe going back to the place where you live?: Yes      Need for Family Participation in Patient Care: Yes (Comment) Care giver support system in place?: Yes (comment) Current home services: DME, Other (comment) (private caregiver-5days 3hrs a day.) Criminal Activity/Legal Involvement Pertinent to Current Situation/Hospitalization: No - Comment as needed  Activities of Daily Living      Permission Sought/Granted Permission sought to share information with : Case Manager Permission granted to share information with : Yes, Verbal Permission Granted  Share Information with NAME: Case Manager           Emotional Assessment Appearance:: Appears stated  age Attitude/Demeanor/Rapport: Gracious Affect (typically observed): Accepting Orientation: : Oriented to Self, Oriented to Place, Oriented to  Time Alcohol / Substance Use: Not Applicable Psych Involvement: No (comment)  Admission diagnosis:  Symptomatic anemia [D64.9] Gastrointestinal hemorrhage, unspecified gastrointestinal hemorrhage type [K92.2] ABLA (acute blood loss anemia) [D62] Patient Active Problem List   Diagnosis Date Noted   GI bleed 02/25/2022   Gross hematuria 02/25/2022   Malignant neoplasm of bladder, unspecified ([place]) 02/25/2022   ABLA (acute blood loss anemia) 02/24/2022   Physical deconditioning 03/25/2021   Transient ischemic attack (TIA) 03/25/2021   Transient ischemic attack 03/24/2021   Stenosis of right carotid artery    Arthritis of left hip 09/01/2014   AAA (abdominal aortic aneurysm) (HCC)    Essential hypertension    CAD (coronary artery disease)    S/P CABG x 4 06/27/2013   Hyperlipidemia    PCP:  Tyler Fast, MD Pharmacy:   [place], [place] - [address] [place] [place] [address] Phone: [phone] Fax: [place], [place] - [address] [name] [address] Phone: [phone] Fax: [phone]     Social Determinants of Health (SDOH) Interventions    Readmission Risk Interventions     No data to display

## 2022-02-25 NOTE — Anesthesia Procedure Notes (Signed)
Date/Time: 02/25/2022 1:15 PM  Performed by: Cynda Familia, CRNAPre-anesthesia Checklist: Patient identified, Emergency Drugs available, Suction available, Timeout performed and Patient being monitored Oxygen Delivery Method: Simple face mask Placement Confirmation: positive ETCO2 and breath sounds checked- equal and bilateral Dental Injury: Teeth and Oropharynx as per pre-operative assessment

## 2022-02-25 NOTE — Progress Notes (Addendum)
Patient admitted past midnight , details please refer to HPI Is seen and examined before EGD, daughter at bedside. He is on room air, appears comfortable, denies pain, reports progressive weakness and sob, found to have anemia, recently diagnosed with bladder cancer supposed to have surgical treatment, also has dark stool, daughter reports patient was recently diagnosed with UTI,  ( had cipro before, then recently started on bactrim),Daughter said will have [facility] culture result  (done from two weeks ago) H/o CVA, plavix held due to acute blood loss anemia Baseline, lives at home, walks with a walker, use wheelchair when go out.

## 2022-02-25 NOTE — Assessment & Plan Note (Addendum)
Patient with gross hematuria for several weeks now.  Could also be the source of his acute blood loss anemia.  We will hold his Plavix.  Patient due to see his urologist for cystoscopy.  Daughter states that he frequently has hematuria when he is UTI.  We will continue him on IV Rocephin for now.

## 2022-02-25 NOTE — Care Management Obs Status (Signed)
[place] NOTIFICATION   Patient Details  Name: Corbin Falck MRN: [id number] Date of Birth: 01/12/1929   Medicare Observation Status Notification Given:  Yes    Dessa Phi, RN 02/25/2022, 2:05 PM

## 2022-02-25 NOTE — Anesthesia Preprocedure Evaluation (Addendum)
Anesthesia Evaluation  Patient identified by MRN, date of birth, ID band Patient awake    Reviewed: Allergy & Precautions, NPO status , Patient's Chart, lab work & pertinent test results  Airway Mallampati: IV  TM Distance: >3 FB Neck ROM: Full    Dental no notable dental hx. (+) Teeth Intact, Dental Advisory Given   Pulmonary neg pulmonary ROS,    Pulmonary exam normal breath sounds clear to auscultation       Cardiovascular hypertension, Pt. on medications + CAD and + CABG (2014)  Normal cardiovascular exam Rhythm:Regular Rate:Normal  AAA stent 2015  CABG 2014   Neuro/Psych Some memory issues, family at the [place], No Residual Symptoms negative psych ROS   GI/Hepatic Neg liver ROS, hiatal hernia, GERD  Medicated and Controlled,  Endo/Other  negative endocrine ROS  Renal/GU Renal InsufficiencyRenal diseaseCr 1.41 Bladder dysfunction (has had hematuria now for 2 to 3 weeks)      Musculoskeletal  (+) Arthritis , Osteoarthritis,    Abdominal (+) + obese,   Peds  Hematology  (+) Blood dyscrasia, anemia , Hb 5.9 yesterday- now s/p 2 units prbcs, Hb up to 8.2   Anesthesia Other Findings HOH  Reproductive/Obstetrics negative OB ROS                          Anesthesia Physical Anesthesia Plan  ASA: 3  Anesthesia Plan: MAC   Post-op Pain Management:    Induction:   PONV Risk Score and Plan: 2 and Propofol infusion and TIVA  Airway Management Planned: Natural Airway and Simple Face Mask  Additional Equipment: None  Intra-op Plan:   Post-operative Plan:   Informed Consent: I have reviewed the patients History and Physical, chart, labs and discussed the procedure including the risks, benefits and alternatives for the proposed anesthesia with the patient or authorized representative who has indicated his/her understanding and acceptance.     Dental advisory given and Consent  reviewed with POA  Plan Discussed with: CRNA  Anesthesia Plan Comments:        Anesthesia Quick Evaluation

## 2022-02-25 NOTE — Anesthesia Preprocedure Evaluation (Signed)
Anesthesia Evaluation  Patient identified by MRN, date of birth, ID band Patient awake    Reviewed: Allergy & Precautions, NPO status , Patient's Chart, lab work & pertinent test results  Airway Mallampati: IV  TM Distance: >3 FB Neck ROM: Full    Dental no notable dental hx. (+) Teeth Intact, Dental Advisory Given   Pulmonary neg pulmonary ROS,    Pulmonary exam normal breath sounds clear to auscultation       Cardiovascular hypertension, Pt. on medications + CAD and + CABG (2014)  Normal cardiovascular exam Rhythm:Regular Rate:Normal  AAA stent 2015  CABG 2014   Neuro/Psych Some memory issues, family at the [place], No Residual Symptoms negative psych ROS   GI/Hepatic Neg liver ROS, hiatal hernia, GERD  Medicated and Controlled,  Endo/Other  negative endocrine ROS  Renal/GU Renal InsufficiencyRenal diseaseCr 1.41 Bladder dysfunction (has had hematuria now for 2 to 3 weeks)      Musculoskeletal  (+) Arthritis , Osteoarthritis,    Abdominal (+) + obese,   Peds  Hematology  (+) Blood dyscrasia, anemia , Lab Results      Component                Value               Date                      WBC                      7.1                 02/25/2022                HGB                      8.2 (L)             02/25/2022                HCT                      26.1 (L)            02/25/2022                MCV                      83.9                02/25/2022                PLT                      179                 02/25/2022              Anesthesia Other Findings HOH  Reproductive/Obstetrics negative OB ROS                            Anesthesia Physical Anesthesia Plan  ASA: 4  Anesthesia Plan: MAC   Post-op Pain Management: Minimal or no pain anticipated   Induction:   PONV Risk Score and Plan: 1 and Propofol infusion, TIVA and Treatment may vary due to age or medical  condition  Airway Management Planned: Natural Airway,  Simple Face Mask and Nasal Cannula  Additional Equipment: None  Intra-op Plan:   Post-operative Plan:   Informed Consent: I have reviewed the patients History and Physical, chart, labs and discussed the procedure including the risks, benefits and alternatives for the proposed anesthesia with the patient or authorized representative who has indicated his/her understanding and acceptance.     Dental advisory given  Plan Discussed with:   Anesthesia Plan Comments:        Anesthesia Quick Evaluation

## 2022-02-26 ENCOUNTER — Inpatient Hospital Stay (HOSPITAL_COMMUNITY): Payer: No Typology Code available for payment source | Admitting: Anesthesiology

## 2022-02-26 ENCOUNTER — Encounter (HOSPITAL_COMMUNITY)
Admission: EM | Disposition: A | Discharge status: Home Health | Payer: Self-pay | Source: Home / Self Care | Attending: Internal Medicine

## 2022-02-26 DIAGNOSIS — Z1212 Encounter for screening for malignant neoplasm of rectum: Secondary | ICD-10-CM

## 2022-02-26 DIAGNOSIS — Z1211 Encounter for screening for malignant neoplasm of colon: Secondary | ICD-10-CM

## 2022-02-26 DIAGNOSIS — R195 Other fecal abnormalities: Secondary | ICD-10-CM

## 2022-02-26 DIAGNOSIS — I2510 Atherosclerotic heart disease of native coronary artery without angina pectoris: Secondary | ICD-10-CM

## 2022-02-26 DIAGNOSIS — I10 Essential (primary) hypertension: Secondary | ICD-10-CM

## 2022-02-26 DIAGNOSIS — K640 First degree hemorrhoids: Secondary | ICD-10-CM

## 2022-02-26 DIAGNOSIS — D62 Acute posthemorrhagic anemia: Secondary | ICD-10-CM | POA: Diagnosis not present

## 2022-02-26 DIAGNOSIS — K635 Polyp of colon: Secondary | ICD-10-CM

## 2022-02-26 DIAGNOSIS — D122 Benign neoplasm of ascending colon: Secondary | ICD-10-CM

## 2022-02-26 HISTORY — PX: POLYPECTOMY: SHX5525

## 2022-02-26 HISTORY — PX: GIVENS CAPSULE STUDY: SHX5432

## 2022-02-26 HISTORY — PX: HEMOSTASIS CLIP PLACEMENT: SHX6857

## 2022-02-26 HISTORY — PX: COLONOSCOPY WITH PROPOFOL: SHX5780

## 2022-02-26 LAB — BPAM RBC
Blood Product Expiration Date: 202308292359
Blood Product Expiration Date: 202308302359
ISSUE DATE / TIME: 202308040331
ISSUE DATE / TIME: 202308040812
Unit Type and Rh: 600
Unit Type and Rh: 600

## 2022-02-26 LAB — TYPE AND SCREEN
ABO/RH(D): A NEG
Antibody Screen: NEGATIVE
Unit division: 0
Unit division: 0

## 2022-02-26 LAB — CBC WITH DIFFERENTIAL/PLATELET
Abs Immature Granulocytes: 0.03 10*3/uL (ref 0.00–0.07)
Basophils Absolute: 0 10*3/uL (ref 0.0–0.1)
Basophils Relative: 1 %
Eosinophils Absolute: 0.1 10*3/uL (ref 0.0–0.5)
Eosinophils Relative: 2 %
HCT: 26.6 % — ABNORMAL LOW (ref 39.0–52.0)
Hemoglobin: 8.3 g/dL — ABNORMAL LOW (ref 13.0–17.0)
Immature Granulocytes: 0 %
Lymphocytes Relative: 21 %
Lymphs Abs: 1.6 10*3/uL (ref 0.7–4.0)
MCH: 26.3 pg (ref 26.0–34.0)
MCHC: 31.2 g/dL (ref 30.0–36.0)
MCV: 84.2 fL (ref 80.0–100.0)
Monocytes Absolute: 0.7 10*3/uL (ref 0.1–1.0)
Monocytes Relative: 9 %
Neutro Abs: 5.4 10*3/uL (ref 1.7–7.7)
Neutrophils Relative %: 67 %
Platelets: 189 10*3/uL (ref 150–400)
RBC: 3.16 MIL/uL — ABNORMAL LOW (ref 4.22–5.81)
RDW: 16.7 % — ABNORMAL HIGH (ref 11.5–15.5)
WBC: 8 10*3/uL (ref 4.0–10.5)
nRBC: 0 % (ref 0.0–0.2)

## 2022-02-26 LAB — BASIC METABOLIC PANEL
Anion gap: 9 (ref 5–15)
BUN: 21 mg/dL (ref 8–23)
CO2: 23 mmol/L (ref 22–32)
Calcium: 8.3 mg/dL — ABNORMAL LOW (ref 8.9–10.3)
Chloride: 108 mmol/L (ref 98–111)
Creatinine, Ser: 1.18 mg/dL (ref 0.61–1.24)
GFR, Estimated: 59 mL/min — ABNORMAL LOW (ref >60)
Glucose, Bld: 91 mg/dL (ref 70–99)
Potassium: 3.8 mmol/L (ref 3.5–5.1)
Sodium: 140 mmol/L (ref 135–145)

## 2022-02-26 SURGERY — IMAGING PROCEDURE, GI TRACT, INTRALUMINAL, VIA CAPSULE
Anesthesia: LOCAL

## 2022-02-26 SURGERY — COLONOSCOPY
Anesthesia: Monitor Anesthesia Care

## 2022-02-26 SURGERY — COLONOSCOPY WITH PROPOFOL
Anesthesia: Monitor Anesthesia Care

## 2022-02-26 MED ORDER — LACTATED RINGERS IV SOLN: INTRAVENOUS | Status: DC

## 2022-02-26 MED ORDER — LACTATED RINGERS IV SOLN: INTRAVENOUS | Status: DC | PRN

## 2022-02-26 MED ORDER — EPHEDRINE SULFATE-NACL 50-0.9 MG/10ML-% IV SOSY
PREFILLED_SYRINGE | INTRAVENOUS | Status: DC | PRN
  Administered 2022-02-26 (×5): 5 mg via INTRAVENOUS

## 2022-02-26 MED ORDER — PROPOFOL 500 MG/50ML IV EMUL
INTRAVENOUS | Status: DC | PRN
  Administered 2022-02-26: 100 ug/kg/min via INTRAVENOUS

## 2022-02-26 MED ORDER — PROPOFOL 10 MG/ML IV BOLUS
INTRAVENOUS | Status: DC | PRN
  Administered 2022-02-26: 20 mg via INTRAVENOUS

## 2022-02-26 MED ORDER — SODIUM CHLORIDE 0.9 % IV SOLN: INTRAVENOUS | Status: DC

## 2022-02-26 MED ORDER — LIDOCAINE 2% (20 MG/ML) 5 ML SYRINGE
INTRAMUSCULAR | Status: DC | PRN
  Administered 2022-02-26: 80 mg via INTRAVENOUS

## 2022-02-26 MED ORDER — DEXTROSE IN LACTATED RINGERS 5 % IV SOLN: INTRAVENOUS | Status: AC

## 2022-02-26 MED ORDER — PROPOFOL 500 MG/50ML IV EMUL
INTRAVENOUS | Status: AC
  Filled 2022-02-26: qty 100

## 2022-02-26 MED ORDER — PHENYLEPHRINE 80 MCG/ML (10ML) SYRINGE FOR IV PUSH (FOR BLOOD PRESSURE SUPPORT)
PREFILLED_SYRINGE | INTRAVENOUS | Status: DC | PRN
  Administered 2022-02-26: 160 ug via INTRAVENOUS
  Administered 2022-02-26: 40 ug via INTRAVENOUS
  Administered 2022-02-26: 80 ug via INTRAVENOUS
  Administered 2022-02-26 (×4): 160 ug via INTRAVENOUS
  Administered 2022-02-26: 120 ug via INTRAVENOUS
  Administered 2022-02-26: 80 ug via INTRAVENOUS
  Administered 2022-02-26: 160 ug via INTRAVENOUS
  Administered 2022-02-26: 120 ug via INTRAVENOUS

## 2022-02-26 SURGICAL SUPPLY — 22 items

## 2022-02-26 SURGICAL SUPPLY — 1 items: TOWEL COTTON PACK 4EA (MISCELLANEOUS) ×4 IMPLANT

## 2022-02-26 NOTE — Evaluation (Signed)
Physical Therapy Evaluation Patient Details Name: Tyler [name] MRN: [id number] DOB: 08-30-1928 Today's Date: 02/26/2022  History of Present Illness  88 year old white male with a history of bladder cancer, hyperlipidemia, CKD stage IIIa, coronary disease status post CABG, hypertension presents to the ER today from the PCP office after they found his Hgb to be 5.9.  Clinical Impression  Pt admitted with above diagnosis.  Pt currently with functional limitations due to the deficits listed below (see PT Problem List). Pt will benefit from skilled PT to increase their independence and safety with mobility to allow discharge to the venue listed below.  Pt had the most difficulty with bed mobility.  His sit to stand progressed within session from MOD to MIN A.  He has good family support and he has an important medical procedure mid August at [facility] that he needs to be ready for.  Recommend HHPT/OT.  He was getting those both at home prior to admission.        Recommendations for follow up therapy are one component of a multi-disciplinary discharge planning process, led by the attending physician.  Recommendations may be updated based on patient status, additional functional criteria and insurance authorization.  Follow Up Recommendations Home health PT ([place] as well)      Assistance Recommended at Discharge Frequent or constant Supervision/Assistance  Patient can return home with the following  A little help with walking and/or transfers    Equipment Recommendations None recommended by PT  Recommendations for Other Services       Functional Status Assessment Patient has had a recent decline in their functional status and demonstrates the ability to make significant improvements in function in a reasonable and predictable amount of time.     Precautions / Restrictions Precautions Precautions: Fall Restrictions Weight Bearing Restrictions: No      Mobility  Bed Mobility Overal bed  mobility: Needs Assistance Bed Mobility: Supine to Sit     Supine to sit: Mod assist     General bed mobility comments: MOD A, but after getting up, family said he gets up on other side of the bed at home and does have a rail.    Transfers Overall transfer level: Needs assistance Equipment used: Rolling walker (2 wheels) Transfers: Sit to/from Stand Sit to Stand: Mod assist, Min assist, From elevated surface           General transfer comment: Stood from bed 2x and from recliner 1x.  Each stand he did better with.  MIN A from recliner which was third stand, and first was from bed and elevated surface and needed MOD    Ambulation/Gait Ambulation/Gait assistance: Min assist, +2 safety/equipment Gait Distance (Feet): 44 Feet (plus 34) Assistive device: Rolling walker (2 wheels) Gait Pattern/deviations: Decreased step length - right, Decreased step length - left, Trunk flexed       General Gait Details: Ambulated with steady cadence with daughter bringing recliner behind.  Sitting rest break in between gait trials.  Cues for posture.  Stairs            Wheelchair Mobility    Modified Rankin (Stroke Patients Only)       Balance Overall balance assessment: Needs assistance Sitting-balance support: Feet supported Sitting balance-Leahy Scale: Fair     Standing balance support: Reliant on assistive device for balance Standing balance-Leahy Scale: Poor  Pertinent Vitals/Pain Pain Assessment Pain Assessment: No/denies pain    Home Living Family/patient expects to be discharged to:: Private residence Living Arrangements: Spouse/significant other Available Help at Discharge: Available 24 hours/day Type of Home: House Home Access: Stairs to enter Entrance Stairs-Rails: Can reach both;Right;Left Entrance Stairs-Number of Steps: 3   Home Layout: One level Home Equipment: Conservation officer, nature (2 wheels);Shower seat;Grab bars -  toilet;Grab bars - tub/shower;Wheelchair - manual;BSC/3in1 Additional Comments: Daughter has a Civil Service fast streamer from another family member she can bring in. They also have a lift chair, but don't use it as a lift chair at the moment.    Prior Function Prior Level of Function : Independent/Modified Independent             Mobility Comments: Amb short household distances with RW, primarily bed to chair and the bathroom. S for longer distances and uses w/c for appointments.       Hand Dominance   Dominant Hand: Right    Extremity/Trunk Assessment   Upper Extremity Assessment Upper Extremity Assessment: Generalized weakness    Lower Extremity Assessment Lower Extremity Assessment: Generalized weakness       Communication   Communication: HOH  Cognition Arousal/Alertness: Awake/alert Behavior During Therapy: WFL for tasks assessed/performed Overall Cognitive Status: Within Functional Limits for tasks assessed                                          General Comments General comments (skin integrity, edema, etc.): family reports swelling is much better    Exercises     Assessment/Plan    PT Assessment Patient needs continued PT services  PT Problem List Decreased strength;Decreased mobility;Decreased activity tolerance;Decreased balance       PT Treatment Interventions DME instruction;Gait training;Functional mobility training;Therapeutic exercise;Therapeutic activities;Balance training    PT Goals (Current goals can be found in the Care Plan section)  Acute Rehab PT Goals Patient Stated Goal: Continue getting stronger at home to prepare for his procedure coming up in mid Aug PT Goal Formulation: With patient/family Time For Goal Achievement: 03/12/22 Potential to Achieve Goals: Good    Frequency Min 3X/week     Co-evaluation               AM-PAC PT "6 Clicks" Mobility  Outcome Measure Help needed turning from your back to your side while  in a flat bed without using bedrails?: A Lot Help needed moving from lying on your back to sitting on the side of a flat bed without using bedrails?: A Lot Help needed moving to and from a bed to a chair (including a wheelchair)?: A Little Help needed standing up from a chair using your arms (e.g., wheelchair or bedside chair)?: A Little Help needed to walk in hospital room?: A Little Help needed climbing 3-5 steps with a railing? : A Lot 6 Click Score: 15    End of Session Equipment Utilized During Treatment: Gait belt Activity Tolerance: Patient tolerated treatment well Patient left: in chair;with call bell/phone within reach;with family/visitor present Nurse Communication: Mobility status PT Visit Diagnosis: Muscle weakness (generalized) (M62.81);Difficulty in walking, not elsewhere classified (R26.2)    Time: 1350-1435 PT Time Calculation (min) (ACUTE ONLY): 45 min   Charges:   PT Evaluation $PT Eval Moderate Complexity: 1 Mod PT Treatments $Gait Training: 8-22 mins $Therapeutic Activity: 8-22 mins        Jisele Price L. Tamala Julian,  PT  02/26/2022   Galen Manila 02/26/2022, 3:28 PM

## 2022-02-26 NOTE — Op Note (Signed)
[facility] Patient Name: Tyler [name] Procedure Date: 02/26/2022 MRN: [id number] Attending MD: Gladstone Pih. Candis Schatz , MD Date of Birth: 1928/11/16 CSN: [id number] Age: 88 Admit Type: Inpatient Procedure:                Colonoscopy Indications:              Heme positive stool Providers:                Nicki Reaper E. Candis Schatz, MD, Grace Isaac, RN, Cherylynn Ridges, Technician, Jefm Miles CRNA Referring MD:              Medicines:                Monitored Anesthesia Care Complications:            No immediate complications. Estimated Blood Loss:     Estimated blood loss was minimal. Procedure:                Pre-Anesthesia Assessment:                           - Prior to the procedure, a History and Physical                            was performed, and patient medications and                            allergies were reviewed. The patient's tolerance of                            previous anesthesia was also reviewed. The risks                            and benefits of the procedure and the sedation                            options and risks were discussed with the patient.                            All questions were answered, and informed consent                            was obtained. Prior Anticoagulants: The patient has                            taken Plavix (clopidogrel), last dose was 2 days                            prior to procedure. ASA Grade Assessment: III - A                            patient with severe systemic disease. After  reviewing the risks and benefits, the patient was                            deemed in satisfactory condition to undergo the                            procedure.                           After obtaining informed consent, the colonoscope                            was passed under direct vision. Throughout the                            procedure, the patient's blood  pressure, pulse, and                            oxygen saturations were monitored continuously. The                            CF-HQ190L ([id number] Olympus colonoscope was                            introduced through the anus and advanced to the the                            terminal ileum, with identification of the                            appendiceal orifice and IC valve. The colonoscopy                            was performed without difficulty. The patient                            tolerated the procedure well. The quality of the                            bowel preparation was adequate. Scope In: 8:29:56 AM Scope Out: 10:07:53 AM Scope Withdrawal Time: 0 hours 13 minutes 28 seconds  Total Procedure Duration: 0 hours 21 minutes 4 seconds  Findings:      The perianal and digital rectal examinations were normal. Pertinent       negatives include normal sphincter tone and no palpable rectal lesions.      A 5 mm polyp was found in the ascending colon. The polyp was sessile.       The polyp was removed with a cold snare. Resection and retrieval were       complete. Estimated blood loss was minimal.      A 8 mm polyp was found in the ascending colon. The polyp was sessile.       The polyp was removed with a cold snare. Resection and retrieval were       complete. To prevent bleeding after the polypectomy, three hemostatic  clips were successfully placed (MR conditional). There was no bleeding       at the end of the maneuver.      The exam was otherwise normal throughout the examined colon.      The terminal ileum appeared normal.      Non-bleeding internal hemorrhoids were found during retroflexion. The       hemorrhoids were Grade I (internal hemorrhoids that do not prolapse).      No additional abnormalities were found on retroflexion. Impression:               - One 5 mm polyp in the ascending colon, removed                            with a cold snare. Resected and  retrieved.                           - One 8 mm polyp in the ascending colon, removed                            with a cold snare. Resected and retrieved. Clips                            (MR conditional) were placed.                           - The examined portion of the ileum was normal.                           - Non-bleeding internal hemorrhoids.                           - No endoscopic abnormalities to explain patient's                            positive FOBT or drop in hemoglobin. Moderate Sedation:      Not Applicable - Patient had care per Anesthesia. Recommendation:           - Return patient to hospital ward for ongoing care.                           - Resume previous diet.                           - Resume Plavix (clopidogrel) at prior dose in 2                            days.                           - Await pathology results.                           - Consider video capsule endoscopy for further                            evaluation. Given  stable hgb and absence of overt                            bleeding, this can be done as an outpatient                           - Follow up with Dr. Benson Norway. Procedure Code(s):        --- Professional ---                           [phone], Colonoscopy, flexible; with removal of                            tumor(s), polyp(s), or other lesion(s) by snare                            technique Diagnosis Code(s):        --- Professional ---                           K63.5, Polyp of colon                           K64.0, First degree hemorrhoids                           R19.5, Other fecal abnormalities CPT copyright 2019 American Medical Association. All rights reserved. The codes documented in this report are preliminary and upon coder review may  be revised to meet current compliance requirements. Jenel Gierke E. Candis Schatz, MD 02/26/2022 10:14:45 AM This report has been signed electronically. Number of Addenda: 0

## 2022-02-26 NOTE — Interval H&P Note (Signed)
History and Physical Interval Note:  02/26/2022 9:35 AM  Tyler [name]  has presented today for surgery, with the diagnosis of Heme positive stool and IDA.  The various methods of treatment have been discussed with the patient and family. After consideration of risks, benefits and other options for treatment, the patient has consented to  Procedure(s): COLONOSCOPY WITH PROPOFOL (N/A) as a surgical intervention.  The patient's history has been reviewed, patient examined, no change in status, stable for surgery.  I have reviewed the patient's chart and labs.  Questions were answered to the patient's satisfaction.     Daryel November

## 2022-02-26 NOTE — Progress Notes (Addendum)
PROGRESS NOTE    Tyler [name]  ERX:[id number] DOB: January 20, 1929 DOA: 02/24/2022 PCP: [name], MD     Brief Narrative:   H/o HTN, HLD, CAD s/p CABG, reports progressive weakness and sob, found to have anemia, recently diagnosed with bladder cancer supposed to have surgical treatment, daughter reports patient was recently diagnosed with UTI,  ( had cipro before, then recently started on bactrim), also has dark stool, hgb on presentation was 5.9  Subjective:  He is seen after returned from colonoscopy, he denies pain, reports feeling better Family at bedside  Assessment & Plan:  Principal Problem:   ABLA (acute blood loss anemia) Active Problems:   GI bleed   Gross hematuria   Malignant neoplasm of bladder, unspecified (HCC)   Hyperlipidemia   Essential hypertension   CAD (coronary artery disease)   Symptomatic anemia   Benign neoplasm of ascending colon   Fecal occult blood test positive    Assessment and Plan:   Symptomatic anemia -Hold Plavix -Received PRBC x2, hemoglobin increased from 5.9 to 8.2 -Blood loss from GU and possible GI ( as reports dark stool and + FOBT) -Negative EGD and colonoscopy -Family desires to have small bowel capsule study while in the hospital, they have already urology might cancel scheduled surgery if not completing GI work-up -Management per GI    Malignant neoplasm of bladder, unspecified [facility]) Daughter report preop appointment on 8/7, surgery scheduled on 8/14 F/u with urologist .  Tyler [name] hematuria   Daughter states that he frequently has hematuria when he has UTI.   continue  on IV Rocephin for now, f/u on urine culture, thought not sure if any growth since he has been on abx for uti PTA Urine appear less dark today  CKDIIIa Appear stable ,slightly improved   HTN HLD Aortic Atherosclerosis  CAD s/p CABG Denies chest pain, blood pressure stable  Hold his aspirin /plavix due to hematuria and ?GI bleeding. Continue  Lopressor and Lipitor  ICA stenosis, prior h/o CVA, hold plavix, continue statin, followed by vascular surgery Status post aortobi-iliac endograft repair of infrarenal abdominal aortic aneurysm, followed by vascular surgery   BPH Continue home meds terazosin  FTT, patient has been getting home health PT/RN, family request resumption, transitional care order placed     Body mass index is 32.42 kg/m.[name]  Meet obesity criteria  .     I have Reviewed nursing notes, Vitals, pain scores, I/o's, Lab results and  imaging results since pt's last encounter, details please see discussion above  I ordered the following labs:  Unresulted Labs (From admission, onward)     Start     Ordered   02/27/22 0500  CBC with Differential/Platelet  Tomorrow morning,   R        02/26/22 1505   02/27/22 9509  Basic metabolic panel  Tomorrow morning,   R        02/26/22 1505   02/27/22 0500  Magnesium  Tomorrow morning,   R        02/26/22 1505   02/25/22 1217  Urine Culture  (Urine Culture)  Once,   R       Question:  Indication  Answer:  Dysuria   02/25/22 1217             DVT prophylaxis: SCDs Start: 02/25/22 0054   Code Status:   Code Status: Full Code  Family Communication: Daughter at bedside daily Disposition:    Dispo: The patient is from: Home  Anticipated d/c is to: Home with home health, f19fand HH order placed              Anticipated d/c date is: 8/6 or 8/7, need GI clearance   Antimicrobials:    Anti-infectives (From admission, onward)    Start     Dose/Rate Route Frequency Ordered Stop   02/25/22 0200  cefTRIAXone (ROCEPHIN) 1 g in sodium chloride 0.9 % 100 mL IVPB        1 g 200 mL/hr over 30 Minutes Intravenous Every 24 hours 02/25/22 0045            Objective: Vitals:   02/26/22 1023 02/26/22 1030 02/26/22 1045 02/26/22 1123  BP: (!) 110/46 (!) 108/50 (!) 111/51 (!) 110/49  Pulse: 73 73 72 69  Resp: '19 19 16 19  '$ Temp: 97.9 F (36.6 C)      TempSrc:      SpO2: 100% 100% 100% 99%  Weight:      Height:        Intake/Output Summary (Last 24 hours) at 02/26/2022 1518 Last data filed at 02/26/2022 1028 Gross per 24 hour  Intake 525.58 ml  Output 1300 ml  Net -774.42 ml   Filed Weights   02/25/22 0655 02/25/22 1239  Weight: 102.5 kg 102.5 kg    Examination:  General exam: alert, awake, communicative,calm, NAD Respiratory system: Clear to auscultation. Respiratory effort normal. Cardiovascular system:  RRR.  Gastrointestinal system: Abdomen is nondistended, soft and nontender.  Normal bowel sounds heard. Central nervous system: Alert and oriented. No focal neurological deficits. Extremities:  no edema Skin: No rashes, lesions or ulcers Psychiatry: Judgement and insight appear normal. Mood & affect appropriate.     Data Reviewed: I have personally reviewed  labs and visualized  imaging studies since the last encounter and formulate the plan        Scheduled Meds:  sodium chloride   Intravenous Once   atorvastatin  10 mg Oral Daily   metoprolol tartrate  12.5 mg Oral BID   terazosin  5 mg Oral QHS   Continuous Infusions:  cefTRIAXone (ROCEPHIN)  IV 1 g (02/26/22 0204)   lactated ringers 10 mL/hr at 02/26/22 03664  lactated ringers     pantoprazole 8 mg/hr (02/26/22 1406)     LOS: 1 day     FFlorencia Reasons MD PhD FACP [facility]  Available via Epic secure chat 7am-7pm for nonurgent issues Please page for urgent issues To page the attending provider between 7A-7P or the covering provider during after hours 7P-7A, please log into the web site [email or web address] and access using universal [place] password for that web site. If you do not have the password, please call the hospital operator.    02/26/2022, 3:18 PM

## 2022-02-26 NOTE — Anesthesia Postprocedure Evaluation (Signed)
Anesthesia Post Note  Patient: Krystle Oberman  Procedure(s) Performed: COLONOSCOPY WITH PROPOFOL POLYPECTOMY HEMOSTASIS CLIP PLACEMENT     Patient location during evaluation: Endoscopy Anesthesia Type: MAC Level of consciousness: awake and alert Pain management: pain level controlled Vital Signs Assessment: post-procedure vital signs reviewed and stable Respiratory status: spontaneous breathing, nonlabored ventilation, respiratory function stable and patient connected to nasal cannula oxygen Cardiovascular status: blood pressure returned to baseline and stable Postop Assessment: no apparent nausea or vomiting Anesthetic complications: no   No notable events documented.  Last Vitals:  Vitals:   02/26/22 1030 02/26/22 1045  BP: (!) 108/50 (!) 111/51  Pulse: 73 72  Resp: 19 16  Temp:    SpO2: 100% 100%    Last Pain:  Vitals:   02/26/22 1045  TempSrc:   PainSc: 0-No pain                 Barnet Glasgow

## 2022-02-26 NOTE — Transfer of Care (Signed)
Immediate Anesthesia Transfer of Care Note  Patient: Tyler [name]  Procedure(s) Performed: COLONOSCOPY WITH PROPOFOL POLYPECTOMY HEMOSTASIS CLIP PLACEMENT  Patient Location: PACU  Anesthesia Type:MAC  Level of Consciousness: awake, drowsy and patient cooperative  Airway & Oxygen Therapy: Patient Spontanous Breathing and Patient connected to face mask oxygen  Post-op Assessment: Report given to RN and Post -op Vital signs reviewed and stable  Post vital signs: Reviewed and stable  Last Vitals:  Vitals Value Taken Time  BP 110/46 02/26/22 1023  Temp 36.6 C 02/26/22 1023  Pulse 78 02/26/22 1028  Resp 16 02/26/22 1028  SpO2 100 % 02/26/22 1028  Vitals shown include unvalidated device data.  Last Pain:  Vitals:   02/26/22 1023  TempSrc:   PainSc: 0-No pain         Complications: No notable events documented.

## 2022-02-26 NOTE — Plan of Care (Signed)
  Problem: Education: Goal: Knowledge of General Education information will improve Description: Including pain rating scale, medication(s)/side effects and non-pharmacologic comfort measures Outcome: Progressing   Problem: Coping: Goal: Level of anxiety will decrease Outcome: Progressing   

## 2022-02-26 NOTE — Anesthesia Procedure Notes (Signed)
Procedure Name: MAC Date/Time: 02/26/2022 9:44 AM  Performed by: Eben Burow, CRNAPre-anesthesia Checklist: Patient identified, Emergency Drugs available, Suction available, Patient being monitored and Timeout performed Oxygen Delivery Method: Simple face mask Placement Confirmation: positive ETCO2

## 2022-02-26 NOTE — Progress Notes (Signed)
PT Note: Pt off the floor for a procedure. Will check back as schedule permits.  Santiago Glad L. Tamala Julian, PT  02/26/2022

## 2022-02-27 DIAGNOSIS — D62 Acute posthemorrhagic anemia: Secondary | ICD-10-CM | POA: Diagnosis not present

## 2022-02-27 LAB — BASIC METABOLIC PANEL
Anion gap: 6 (ref 5–15)
BUN: 18 mg/dL (ref 8–23)
CO2: 22 mmol/L (ref 22–32)
Calcium: 8.3 mg/dL — ABNORMAL LOW (ref 8.9–10.3)
Chloride: 113 mmol/L — ABNORMAL HIGH (ref 98–111)
Creatinine, Ser: 1.28 mg/dL — ABNORMAL HIGH (ref 0.61–1.24)
GFR, Estimated: 53 mL/min — ABNORMAL LOW (ref >60)
Glucose, Bld: 135 mg/dL — ABNORMAL HIGH (ref 70–99)
Potassium: 4 mmol/L (ref 3.5–5.1)
Sodium: 141 mmol/L (ref 135–145)

## 2022-02-27 LAB — URINE CULTURE: Culture: <10000 — AB

## 2022-02-27 LAB — CBC WITH DIFFERENTIAL/PLATELET
Abs Immature Granulocytes: 0.04 10*3/uL (ref 0.00–0.07)
Basophils Absolute: 0.1 10*3/uL (ref 0.0–0.1)
Basophils Relative: 1 %
Eosinophils Absolute: 0.1 10*3/uL (ref 0.0–0.5)
Eosinophils Relative: 2 %
HCT: 26 % — ABNORMAL LOW (ref 39.0–52.0)
Hemoglobin: 8 g/dL — ABNORMAL LOW (ref 13.0–17.0)
Immature Granulocytes: 1 %
Lymphocytes Relative: 22 %
Lymphs Abs: 1.7 10*3/uL (ref 0.7–4.0)
MCH: 26.1 pg (ref 26.0–34.0)
MCHC: 30.8 g/dL (ref 30.0–36.0)
MCV: 85 fL (ref 80.0–100.0)
Monocytes Absolute: 0.7 10*3/uL (ref 0.1–1.0)
Monocytes Relative: 9 %
Neutro Abs: 5 10*3/uL (ref 1.7–7.7)
Neutrophils Relative %: 65 %
Platelets: 162 10*3/uL (ref 150–400)
RBC: 3.06 MIL/uL — ABNORMAL LOW (ref 4.22–5.81)
RDW: 17.2 % — ABNORMAL HIGH (ref 11.5–15.5)
WBC: 7.5 10*3/uL (ref 4.0–10.5)
nRBC: 0 % (ref 0.0–0.2)

## 2022-02-27 LAB — MAGNESIUM: Magnesium: 1.9 mg/dL (ref 1.7–2.4)

## 2022-02-27 MED ORDER — CLOPIDOGREL BISULFATE 75 MG PO TABS: 75.0000 mg | ORAL_TABLET | Freq: Every day | ORAL | Status: AC

## 2022-02-27 MED ORDER — ASPIRIN EC 81 MG PO TBEC: 81.0000 mg | DELAYED_RELEASE_TABLET | Freq: Every day | ORAL | 3 refills | Status: AC

## 2022-02-27 NOTE — Plan of Care (Signed)
  Problem: Education: Goal: Knowledge of General Education information will improve Description: Including pain rating scale, medication(s)/side effects and non-pharmacologic comfort measures Outcome: Progressing   Problem: Clinical Measurements: Goal: Ability to maintain clinical measurements within normal limits will improve Outcome: Progressing   Problem: Safety: Goal: Ability to remain free from injury will improve Outcome: Progressing   

## 2022-02-27 NOTE — TOC Transition Note (Addendum)
Transition of Care [facility]) - CM/SW Discharge Note   Patient Details  Name: Tyler [name] MRN: [id number] Date of Birth: January 17, 1929  [facility]) CM/SW Contact:  Dessa Phi, RN Phone Number: 02/27/2022, 2:05 PM   Clinical Narrative: Per spouse patient gets HHPT/OT through the [facility] they will set up resuming [place] services-spouse [name] aware of [facility] process. No further CM needs.   -2:27p-orders faxed to Dr. [name] fax#[phone] for set up resumption of HHC-HHPT/OT orders.    Final next level of care: [place] Barriers to Discharge: No Barriers Identified   Patient Goals and CMS Choice Patient states their goals for this hospitalization and ongoing recovery are:: Home CMS Medicare.gov Compare Post Acute Care list provided to:: Patient Represenative (must comment) [name] (spouse)) Choice offered to / list presented to : Spouse  Discharge Placement                       Discharge Plan and Services   Discharge Planning Services: CM Consult Post Acute Care Choice: Home Health                               Social Determinants of Health (SDOH) Interventions     Readmission Risk Interventions     No data to display

## 2022-02-27 NOTE — Discharge Summary (Signed)
Discharge Summary  Tyler [name] PXT:[id number] DOB: 04-23-1929  PCP: [name], MD  Admit date: 02/24/2022 Discharge date: 02/27/2022  Time spent: 11mns, more than 50% time spent on coordination of care.   Recommendations for Outpatient Follow-up:  F/u with PCP within a week  for hospital discharge follow up, repeat cbc/bmp at follow up F/u with urology next week as scheduled   Discharge Diagnoses:  Active Hospital Problems   Diagnosis Date Noted   ABLA (acute blood loss anemia) 02/24/2022    Priority: High   GI bleed 02/25/2022    Priority: Medium    Gross hematuria 02/25/2022    Priority: Medium    Malignant neoplasm of bladder, unspecified ([place] 02/25/2022    Priority: Medium    Essential hypertension     Priority: Low   CAD (coronary artery disease)     Priority: Low   Hyperlipidemia     Priority: Low   Benign neoplasm of ascending colon    Fecal occult blood test positive    Symptomatic anemia 02/25/2022    Resolved Hospital Problems  No resolved problems to display.    Discharge Condition: stable  Diet recommendation: heart healthy  Filed Weights   02/25/22 0655 02/25/22 1239 02/27/22 0454  Weight: 102.5 kg 102.5 kg 102.3 kg    History of present illness: ( per admitting provider Dr CBridgett Larsson CC: anemia, hematuria HPI: 88year old white male with a history of bladder cancer, hyperlipidemia, CKD stage IIIa, coronary disease status post CABG, hypertension presents to the ER today from the PCP office.  Patient been noted to have dyspnea with activity for several days now.  He has had hematuria now for 2 to 3 weeks.  He has been on some antibiotics for history of UTI.  Patient has been recently on Bactrim.  He has been having hematuria for several weeks now.  Daughter states that patient is supposed to have a cystoscopy with bladder scraping due to his history of bladder cancer.   Due to the patient's increasing fatigue and dyspnea exertion, he was taken to the  PCP office today.  Blood work showed the patient was anemic and he was sent to the ER for repeat CBC.   In the ER, repeat CBC showed a hemoglobin of 5.9.   Patient was recently started on Plavix in June 2023 due to carotid stenosis.   Patient noted to have heme positive stools.   Due to the patient's anemia, [facility] hospitalist contacted for admission.    ED Course: Repeat CBC shows a hemoglobin of 5.9.  Hospital Course:  Principal Problem:   ABLA (acute blood loss anemia) Active Problems:   GI bleed   Gross hematuria   Malignant neoplasm of bladder, unspecified (HCC)   Hyperlipidemia   Essential hypertension   CAD (coronary artery disease)   Symptomatic anemia   Benign neoplasm of ascending colon   Fecal occult blood test positive   Assessment and Plan:   Symptomatic anemia -Blood loss from GU as gross hematuria and possible GI (  reports dark stool and + FOBT)-- -greatly appreciate GI input, Negative EGD and colonoscopy, small bowel capsul study questionable small AVM, per GI ok to resume plavix from GI stand point , currently holding plavix due to gu issues, resume plavix when ok with urology -he Received PRBC x2, hemoglobin increased from 5.9 to 8.2, has been stable, patient is feeling better , desires to go home as he has an appointment on Monday with urology -stool is brown per  daughter  - GI cleared patient to discharge        Malignant neoplasm of bladder, unspecified ([place]) with gross hematuria He was prescribed bactrim x7 days and he took 5 days bactrim prior to admission,  he received rocephin x3 days in the hospital, urine culture obtained on admission insignificant growth (less than 10,000 colonies) Daughter report preop appointment on 8/7, surgery scheduled on 8/14 F/u with urologist .     CKDIIIa Appear stable , f/u with pcp   HTN HLD Aortic Atherosclerosis  CAD s/p CABG Denies chest pain, blood pressure stable  Hold plavix due to planned surgery on  8/14, hold asa if overt bleeding, expect resumption after urology clearance  Continue Lopressor and Lipitor F/u with pcp   ICA stenosis, prior h/o CVA, hold plavix, resume once oked with urology  continue statin, followed by vascular surgery Status post aortobi-iliac endograft repair of infrarenal abdominal aortic aneurysm, followed by vascular surgery     BPH Continue home meds terazosin   FTT, patient has been getting home health PT/RN, family request resumption, transitional care order placed       Body mass index is 32.42 kg/m.[name]  Meet obesity criteria   . Discharge Exam: BP (!) 103/58 (BP Location: Right Arm)   Pulse 87   Temp 98.3 F (36.8 C) (Oral)   Resp 16   Ht '5\' 10"'$  (1.778 m)   Wt 102.3 kg   SpO2 97%   BMI 32.36 kg/m   General: NAD Cardiovascular: RRR Respiratory: normal respiratory effort     Discharge Instructions     Diet general   Complete by: As directed    Increase activity slowly   Complete by: As directed       Allergies as of 02/27/2022   No Known Allergies      Medication List     STOP taking these medications    Bactrim 400-80 MG tablet Generic drug: sulfamethoxazole-trimethoprim       TAKE these medications    aspirin EC 81 MG tablet Take 1 tablet (81 mg total) by mouth daily. Swallow whole. Hold if  overt external bleeding What changed: additional instructions   atorvastatin 40 MG tablet Commonly known as: LIPITOR Take 40 mg by mouth daily.   clopidogrel 75 MG tablet Commonly known as: PLAVIX Take 1 tablet (75 mg total) by mouth daily. Hold plavix, resume when ok with urology What changed: additional instructions   metoprolol tartrate 25 MG tablet Commonly known as: LOPRESSOR Take 0.5 tablets (12.5 mg total) by mouth 2 (two) times daily.   nitroGLYCERIN 0.4 MG SL tablet Commonly known as: NITROSTAT Place 1 tablet (0.4 mg total) under the tongue every 5 (five) minutes as needed for chest pain.   terazosin 5 MG  capsule Commonly known as: HYTRIN Take 5 mg by mouth at bedtime.       No Known Allergies  Follow-up Information     [name], MD Follow up.   Specialty: Internal Medicine Contact information: [address] [place] [phone]         Belva Crome, MD .   Specialty: Cardiology Contact information: [phone] [address] [place] [address] [phone]                  The results of significant diagnostics from this hospitalization (including imaging, microbiology, ancillary and laboratory) are listed below for reference.    Significant Diagnostic Studies: CT ANGIO ABDOMEN PELVIS  W &/  OR WO CONTRAST  Result Date: 02/04/2022 CLINICAL DATA:  88 year old male with history of abdominal aortic aneurysm status post endovascular repair. EXAM: CT ANGIOGRAPHY ABDOMEN AND PELVIS WITH CONTRAST AND WITHOUT CONTRAST TECHNIQUE: Multidetector CT imaging of the abdomen and pelvis was performed using the standard protocol during bolus administration of intravenous contrast. Multiplanar reconstructed images and MIPs were obtained and reviewed to evaluate the vascular anatomy. RADIATION DOSE REDUCTION: This exam was performed according to the departmental dose-optimization program which includes automated exposure control, adjustment of the mA and/or kV according to patient size and/or use of iterative reconstruction technique. CONTRAST:  22m ISOVUE-370 IOPAMIDOL (ISOVUE-370) INJECTION 76% COMPARISON:  None Available. FINDINGS: VASCULAR Aorta: Status post repair of infrarenal abdominal aortic aneurysm with aorto bi-iliac endograft. The endograft is well apposed proximally and distally and patent throughout. The aneurysm sac measures up to 16 mm in greatest short axis dimension. No definite evidence of endoleak, however the examination is limited given absence of non contrasted phase. Atherosclerotic calcifications of the uncovered, suprarenal abdominal aorta  which is normal in caliber. Celiac: Mild ostial stenosis secondary to atherosclerotic plaque. Patent distally. SMA: Mild ostial stenosis secondary to atherosclerotic plaque. Patent distally. Renals: Single bilateral renal arteries are patent. IMA: Ostial occlusion secondary to endograft, patent distally. Inflow: Bilateral common iliac endograft limbs are patent. The external and internal iliac arteries are patent bilaterally. Proximal Outflow: Bilateral common femoral and visualized portions of the superficial and profunda femoral arteries are patent without evidence of aneurysm, dissection, vasculitis or significant stenosis. Veins: No obvious venous abnormality within the limitations of this primarily arterial phase study. Review of the MIP images confirms the above findings. NON-VASCULAR Lower chest: Trace left pleural effusion. The heart is normal in size. No pericardial effusion. Postsurgical changes after coronary artery bypass graft. Hepatobiliary: No focal liver abnormality is seen. No gallstones, gallbladder wall thickening, or biliary dilatation. Pancreas: Unremarkable. No pancreatic ductal dilatation or surrounding inflammatory changes. Spleen: Normal in size without focal abnormality. Adrenals/Urinary Tract: Adrenal glands are unremarkable. Symmetric moderate renal cortical atrophy. Kidneys are otherwise normal, without renal calculi, focal lesion, or hydronephrosis. Bladder is unremarkable. Stomach/Bowel: Stomach is within normal limits. Appendix appears normal. Few scattered colonic diverticula. No evidence of bowel wall thickening, distention, or inflammatory changes. Lymphatic: No abdominopelvic lymphadenopathy. Reproductive: Prostate is partially obscured by streak artifact, unremarkable. Other: No abdominal wall hernia or abnormality. No abdominopelvic ascites. Musculoskeletal: Multilevel degenerative changes of the thoracolumbar spine. Left total hip arthroplasty is partially visualized, no  complicating features. No acute osseous abnormality. IMPRESSION: VASCULAR 1. Status post aortobi-iliac endograft repair of infrarenal abdominal aortic aneurysm. Aneurysm sac measures up to 6.0 cm. No evidence of endoleak, given limitations in this dual phase study. 2.  Aortic Atherosclerosis (ICD10-I70.0). NON-VASCULAR 1. No acute abdominopelvic abnormality. 2. Moderate renal cortical atrophy. 3. Trace left pleural effusion. DRuthann Cancer MD [facility] Electronically Signed   By: DRuthann CancerM.D.   On: 02/04/2022 16:17    Microbiology: Recent Results (from the past 240 hour(s))  Urine Culture     Status: Abnormal   Collection Time: 02/25/22  3:47 PM   Specimen: Urine, Clean Catch  Result Value Ref Range Status   Specimen Description   Final    URINE, CLEAN CATCH Performed at [facility] [address] [address] [place] [address]   Special Requests   Final    NONE Performed at [facility] [address], [place] [address]  Culture (A)  Final    <10,000 COLONIES/mL INSIGNIFICANT GROWTH Performed at [place] [address] [place], [place] [address]    Report Status 02/27/2022 FINAL  Final     Labs: Basic Metabolic Panel: Recent Labs  Lab 02/24/22 2106 02/26/22 0457 02/27/22 0540  NA 141 140 141  K 4.3 3.8 4.0  CL 107 108 113*  CO2 '24 23 22  '$ GLUCOSE 114* 91 135*  BUN 33* 21 18  CREATININE 1.41* 1.18 1.28*  CALCIUM 9.1 8.3* 8.3*  MG  --   --  1.9   Liver Function Tests: No results for input(s): "AST", "ALT", "ALKPHOS", "BILITOT", "PROT", "ALBUMIN" in the last 168 hours. No results for input(s): "LIPASE", "AMYLASE" in the last 168 hours. No results for input(s): "AMMONIA" in the last 168 hours. CBC: Recent Labs  Lab 02/24/22 2106 02/25/22 1140 02/26/22 0457 02/27/22 0540  WBC 8.1 7.1 8.0 7.5  NEUTROABS 5.3  --  5.4 5.0  HGB 5.9* 8.2* 8.3* 8.0*   HCT 19.8* 26.1* 26.6* 26.0*  MCV 82.2 83.9 84.2 85.0  PLT 188 179 189 162   Cardiac Enzymes: No results for input(s): "CKTOTAL", "CKMB", "CKMBINDEX", "TROPONINI" in the last 168 hours. BNP: BNP (last 3 results) No results for input(s): "BNP" in the last 8760 hours.  ProBNP (last 3 results) No results for input(s): "PROBNP" in the last 8760 hours.  CBG: No results for input(s): "GLUCAP" in the last 168 hours.  FURTHER DISCHARGE INSTRUCTIONS:   Get Medicines reviewed and adjusted: Please take all your medications with you for your next visit with your Primary MD   Laboratory/radiological data: Please request your Primary MD to go over all hospital tests and procedure/radiological results at the follow up, please ask your Primary MD to get all Hospital records sent to his/her office.   In some cases, they will be blood work, cultures and biopsy results pending at the time of your discharge. Please request that your primary care M.D. goes through all the records of your hospital data and follows up on these results.   Also Note the following: If you experience worsening of your admission symptoms, develop shortness of breath, life threatening emergency, suicidal or homicidal thoughts you must seek medical attention immediately by calling 911 or calling your MD immediately  if symptoms less severe.   You must read complete instructions/literature along with all the possible adverse reactions/side effects for all the Medicines you take and that have been prescribed to you. Take any new Medicines after you have completely understood and accpet all the possible adverse reactions/side effects.    Do not drive when taking Pain medications or sleeping medications (Benzodaizepines)   Do not take more than prescribed Pain, Sleep and Anxiety Medications. It is not advisable to combine anxiety,sleep and pain medications without talking with your primary care practitioner   Special Instructions:  If you have smoked or chewed Tobacco  in the last 2 yrs please stop smoking, stop any regular Alcohol  and or any Recreational drug use.   Wear Seat belts while driving.   Please note: You were cared for by a hospitalist during your hospital stay. Once you are discharged, your primary care physician will handle any further medical issues. Please note that NO REFILLS for any discharge medications will be authorized once you are discharged, as it is imperative that you return to your primary care physician (or establish a relationship with a primary care physician if you do not have  one) for your post hospital discharge needs so that they can reassess your need for medications and monitor your lab values.     Signed:  Florencia Reasons MD, PhD, FACP  [facility] 02/27/2022, 1:42 PM

## 2022-02-28 ENCOUNTER — Ambulatory Visit: Payer: Self-pay

## 2022-02-28 ENCOUNTER — Encounter: Payer: Self-pay | Admitting: Interventional Cardiology

## 2022-02-28 ENCOUNTER — Encounter (HOSPITAL_COMMUNITY): Payer: Self-pay | Admitting: Gastroenterology

## 2022-02-28 NOTE — Patient Outreach (Signed)
  Care Coordination TOC Note Transition Care Management Follow-up Telephone Call Date of discharge and from where: [name] 02/24/22-02/27/22 How have you been since you were released from the hospital? "I have been doing ok". Any questions or concerns? No  Items Reviewed: Did the pt receive and understand the discharge instructions provided? Yes  Medications obtained and verified? Yes  Other? No  Any new allergies since your discharge? No  Dietary orders reviewed? No Do you have support at home? Yes -spouse Patient hung up the phone prior to completing the remaining questions.  HH and DME information obtained from dc summary. Home Care and Equipment/Supplies: Were home health services ordered? yes If so, what is the name of the agency? Home Health through [place]  Has the agency set up a time to come to the patient's home? Unknown Were any new equipment or medical supplies ordered?  No What is the name of the medical supply agency? N/A Were you able to get the supplies/equipment? not applicable Do you have any questions related to the use of the equipment or supplies? No  Functional Questionnaire: (I = Independent and D = Dependent) ADLs: Unknown  Bathing/Dressing- Unknown  Meal Prep- Unknown  Eating- Unknown  Maintaining continence- Unknown  Transferring/Ambulation- Unknown  Managing Meds- D  Follow up appointments reviewed:  PCP Hospital f/u appt confirmed? No  Patient to follow up with [facility] f/u appt confirmed? No   Are transportation arrangements needed? No  If their condition worsens, is the pt aware to call PCP or go to the Emergency Dept.? Yes Was the patient provided with contact information for the PCP's office or ED? Yes Was to pt encouraged to call back with questions or concerns? Yes  SDOH assessments and interventions completed:   No-Patient hung up phone prior to assessment  Care Coordination Interventions Activated:  No   Care Coordination  Interventions:   Not applicable     Encounter Outcome:  Pt. Visit Completed

## 2022-03-01 LAB — SURGICAL PATHOLOGY

## 2022-03-02 MED ORDER — FUROSEMIDE 20 MG PO TABS: 20.0000 mg | ORAL_TABLET | Freq: Every day | ORAL | 11 refills | Status: DC | PRN

## 2022-03-18 ENCOUNTER — Encounter: Payer: Self-pay | Admitting: Interventional Cardiology

## 2022-04-01 ENCOUNTER — Encounter (HOSPITAL_COMMUNITY): Payer: Self-pay | Admitting: Emergency Medicine

## 2022-04-01 ENCOUNTER — Inpatient Hospital Stay (HOSPITAL_COMMUNITY)
Admission: EM | Admit: 2022-04-01 | Discharge: 2022-04-05 | DRG: 812 | Disposition: A | Discharge status: Home Health | Payer: No Typology Code available for payment source | Attending: Internal Medicine | Admitting: Internal Medicine

## 2022-04-01 DIAGNOSIS — N1831 Chronic kidney disease, stage 3a: Secondary | ICD-10-CM | POA: Diagnosis present

## 2022-04-01 DIAGNOSIS — Z79899 Other long term (current) drug therapy: Secondary | ICD-10-CM

## 2022-04-01 DIAGNOSIS — K5520 Angiodysplasia of colon without hemorrhage: Secondary | ICD-10-CM | POA: Diagnosis present

## 2022-04-01 DIAGNOSIS — D649 Anemia, unspecified: Principal | ICD-10-CM | POA: Diagnosis present

## 2022-04-01 DIAGNOSIS — Z7902 Long term (current) use of antithrombotics/antiplatelets: Secondary | ICD-10-CM | POA: Diagnosis not present

## 2022-04-01 DIAGNOSIS — Z951 Presence of aortocoronary bypass graft: Secondary | ICD-10-CM | POA: Diagnosis not present

## 2022-04-01 DIAGNOSIS — I129 Hypertensive chronic kidney disease with stage 1 through stage 4 chronic kidney disease, or unspecified chronic kidney disease: Secondary | ICD-10-CM | POA: Diagnosis present

## 2022-04-01 DIAGNOSIS — Z974 Presence of external hearing-aid: Secondary | ICD-10-CM

## 2022-04-01 DIAGNOSIS — Z96642 Presence of left artificial hip joint: Secondary | ICD-10-CM | POA: Diagnosis present

## 2022-04-01 DIAGNOSIS — E785 Hyperlipidemia, unspecified: Secondary | ICD-10-CM | POA: Diagnosis present

## 2022-04-01 DIAGNOSIS — Z8249 Family history of ischemic heart disease and other diseases of the circulatory system: Secondary | ICD-10-CM

## 2022-04-01 DIAGNOSIS — N3001 Acute cystitis with hematuria: Secondary | ICD-10-CM | POA: Diagnosis present

## 2022-04-01 DIAGNOSIS — I2510 Atherosclerotic heart disease of native coronary artery without angina pectoris: Secondary | ICD-10-CM | POA: Diagnosis present

## 2022-04-01 DIAGNOSIS — Z7982 Long term (current) use of aspirin: Secondary | ICD-10-CM | POA: Diagnosis not present

## 2022-04-01 DIAGNOSIS — D500 Iron deficiency anemia secondary to blood loss (chronic): Principal | ICD-10-CM | POA: Diagnosis present

## 2022-04-01 DIAGNOSIS — B965 Pseudomonas (aeruginosa) (mallei) (pseudomallei) as the cause of diseases classified elsewhere: Secondary | ICD-10-CM | POA: Diagnosis present

## 2022-04-01 DIAGNOSIS — K219 Gastro-esophageal reflux disease without esophagitis: Secondary | ICD-10-CM | POA: Diagnosis present

## 2022-04-01 DIAGNOSIS — D631 Anemia in chronic kidney disease: Secondary | ICD-10-CM | POA: Diagnosis present

## 2022-04-01 DIAGNOSIS — R627 Adult failure to thrive: Secondary | ICD-10-CM | POA: Diagnosis present

## 2022-04-01 DIAGNOSIS — Z8673 Personal history of transient ischemic attack (TIA), and cerebral infarction without residual deficits: Secondary | ICD-10-CM

## 2022-04-01 DIAGNOSIS — R195 Other fecal abnormalities: Secondary | ICD-10-CM | POA: Diagnosis present

## 2022-04-01 DIAGNOSIS — R262 Difficulty in walking, not elsewhere classified: Secondary | ICD-10-CM | POA: Diagnosis present

## 2022-04-01 DIAGNOSIS — N400 Enlarged prostate without lower urinary tract symptoms: Secondary | ICD-10-CM | POA: Diagnosis present

## 2022-04-01 DIAGNOSIS — I700 Atherosclerosis of aorta: Secondary | ICD-10-CM | POA: Diagnosis present

## 2022-04-01 DIAGNOSIS — C679 Malignant neoplasm of bladder, unspecified: Secondary | ICD-10-CM | POA: Diagnosis present

## 2022-04-01 DIAGNOSIS — I6521 Occlusion and stenosis of right carotid artery: Secondary | ICD-10-CM | POA: Diagnosis present

## 2022-04-01 DIAGNOSIS — K649 Unspecified hemorrhoids: Secondary | ICD-10-CM | POA: Diagnosis present

## 2022-04-01 DIAGNOSIS — I10 Essential (primary) hypertension: Secondary | ICD-10-CM | POA: Diagnosis present

## 2022-04-01 LAB — URINALYSIS, MICROSCOPIC (REFLEX): WBC, UA: >50 WBC/hpf (ref 0–5)

## 2022-04-01 LAB — URINALYSIS, ROUTINE W REFLEX MICROSCOPIC
Bilirubin Urine: NEGATIVE
Glucose, UA: 100 mg/dL — AB
Ketones, ur: NEGATIVE mg/dL
Nitrite: POSITIVE — AB
Protein, ur: 100 mg/dL — AB
Specific Gravity, Urine: 1.025 (ref 1.005–1.030)
pH: 6 (ref 5.0–8.0)

## 2022-04-01 LAB — CBC WITH DIFFERENTIAL/PLATELET
Abs Immature Granulocytes: 0.07 10*3/uL (ref 0.00–0.07)
Basophils Absolute: 0.1 10*3/uL (ref 0.0–0.1)
Basophils Relative: 1 %
Eosinophils Absolute: 0.2 10*3/uL (ref 0.0–0.5)
Eosinophils Relative: 2 %
HCT: 22.4 % — ABNORMAL LOW (ref 39.0–52.0)
Hemoglobin: 6.7 g/dL — CL (ref 13.0–17.0)
Immature Granulocytes: 1 %
Lymphocytes Relative: 18 %
Lymphs Abs: 2.1 10*3/uL (ref 0.7–4.0)
MCH: 25.7 pg — ABNORMAL LOW (ref 26.0–34.0)
MCHC: 29.9 g/dL — ABNORMAL LOW (ref 30.0–36.0)
MCV: 85.8 fL (ref 80.0–100.0)
Monocytes Absolute: 0.9 10*3/uL (ref 0.1–1.0)
Monocytes Relative: 8 %
Neutro Abs: 8.1 10*3/uL — ABNORMAL HIGH (ref 1.7–7.7)
Neutrophils Relative %: 70 %
Platelets: 298 10*3/uL (ref 150–400)
RBC: 2.61 MIL/uL — ABNORMAL LOW (ref 4.22–5.81)
RDW: 18.9 % — ABNORMAL HIGH (ref 11.5–15.5)
WBC: 11.5 10*3/uL — ABNORMAL HIGH (ref 4.0–10.5)
nRBC: 0.2 % (ref 0.0–0.2)

## 2022-04-01 LAB — COMPREHENSIVE METABOLIC PANEL
ALT: 12 U/L (ref 0–44)
AST: 16 U/L (ref 15–41)
Albumin: 2.9 g/dL — ABNORMAL LOW (ref 3.5–5.0)
Alkaline Phosphatase: 48 U/L (ref 38–126)
Anion gap: 7 (ref 5–15)
BUN: 30 mg/dL — ABNORMAL HIGH (ref 8–23)
CO2: 23 mmol/L (ref 22–32)
Calcium: 8.4 mg/dL — ABNORMAL LOW (ref 8.9–10.3)
Chloride: 113 mmol/L — ABNORMAL HIGH (ref 98–111)
Creatinine, Ser: 1.39 mg/dL — ABNORMAL HIGH (ref 0.61–1.24)
GFR, Estimated: 48 mL/min — ABNORMAL LOW (ref >60)
Glucose, Bld: 98 mg/dL (ref 70–99)
Potassium: 3.5 mmol/L (ref 3.5–5.1)
Sodium: 143 mmol/L (ref 135–145)
Total Bilirubin: 0.6 mg/dL (ref 0.3–1.2)
Total Protein: 6.2 g/dL — ABNORMAL LOW (ref 6.5–8.1)

## 2022-04-01 LAB — PREPARE RBC (CROSSMATCH)

## 2022-04-01 MED ORDER — METOPROLOL TARTRATE 25 MG PO TABS
12.5000 mg | ORAL_TABLET | Freq: Two times a day (BID) | ORAL | Status: DC
  Administered 2022-04-02 – 2022-04-05 (×5): 12.5 mg via ORAL
  Filled 2022-04-01 (×6): qty 1

## 2022-04-01 MED ORDER — PANTOPRAZOLE SODIUM 40 MG IV SOLR
40.0000 mg | Freq: Two times a day (BID) | INTRAVENOUS | Status: DC
  Administered 2022-04-01 – 2022-04-02 (×3): 40 mg via INTRAVENOUS
  Filled 2022-04-01 (×3): qty 10

## 2022-04-01 MED ORDER — SODIUM CHLORIDE 0.9 % IV SOLN: INTRAVENOUS | Status: DC

## 2022-04-01 MED ORDER — TERAZOSIN HCL 5 MG PO CAPS
5.0000 mg | ORAL_CAPSULE | Freq: Every day | ORAL | Status: DC
  Administered 2022-04-01 – 2022-04-04 (×4): 5 mg via ORAL
  Filled 2022-04-01 (×4): qty 1

## 2022-04-01 MED ORDER — SODIUM CHLORIDE 0.9 % IV SOLN
1.0000 g | INTRAVENOUS | Status: DC
  Administered 2022-04-01 – 2022-04-03 (×3): 1 g via INTRAVENOUS
  Filled 2022-04-01 (×4): qty 10

## 2022-04-01 MED ORDER — ONDANSETRON HCL 4 MG PO TABS: 4.0000 mg | ORAL_TABLET | Freq: Four times a day (QID) | ORAL | Status: DC | PRN

## 2022-04-01 MED ORDER — ATORVASTATIN CALCIUM 40 MG PO TABS
40.0000 mg | ORAL_TABLET | Freq: Every day | ORAL | Status: DC
  Administered 2022-04-02 – 2022-04-04 (×3): 40 mg via ORAL
  Filled 2022-04-01 (×3): qty 1

## 2022-04-01 MED ORDER — ASPIRIN 81 MG PO TBEC
81.0000 mg | DELAYED_RELEASE_TABLET | Freq: Every day | ORAL | Status: DC
  Administered 2022-04-02 – 2022-04-05 (×4): 81 mg via ORAL
  Filled 2022-04-01 (×4): qty 1

## 2022-04-01 MED ORDER — ONDANSETRON HCL 4 MG/2ML IJ SOLN: 4.0000 mg | Freq: Four times a day (QID) | INTRAMUSCULAR | Status: DC | PRN

## 2022-04-01 MED ORDER — ACETAMINOPHEN 650 MG RE SUPP: 650.0000 mg | Freq: Four times a day (QID) | RECTAL | Status: DC | PRN

## 2022-04-01 MED ORDER — SODIUM CHLORIDE 0.9 % IV SOLN
10.0000 mL/h | Freq: Once | INTRAVENOUS | Status: AC
  Administered 2022-04-01: 10 mL/h via INTRAVENOUS

## 2022-04-01 MED ORDER — SODIUM CHLORIDE 0.9 % IV SOLN: 1.0000 g | Freq: Once | INTRAVENOUS | Status: DC

## 2022-04-01 MED ORDER — ACETAMINOPHEN 325 MG PO TABS: 650.0000 mg | ORAL_TABLET | Freq: Four times a day (QID) | ORAL | Status: DC | PRN

## 2022-04-01 NOTE — H&P (Signed)
History and Physical    Tyler [name] WJX:[id number] DOB: October 10, 1928 DOA: 04/01/2022  PCP: [name], MD   Patient coming from:Home Chief Complaint  Patient presents with   Weakness     HPI:88 year old male with complex medical history including bladder cancer with recent procedure, hyperlipidemia, CKD stage IIIa, CAD with CABG, hypertension/HLD/aortic atherosclerosis/ICA stenosis with prior history of CVA, BPH, failure to thrive presents to the ED with complaint of generalized weakness, polyuria and dysuria for a week.  He was supposed to see PCP today for urine culture but was too weak to go so brought to the ED.Recent admission 8/3 to 8/6 with symptomatic/acute blood loss anemia had negative EGD, colonoscopy and small bowel capsule study with questionable small AVM, Plavix was held, had 2 units transfusion done and was discharged home.  He was not able to void for last 3 days due to dysurea.He denies any black stool or hematuria. No nausea or vomiting. He has been back on plavix and aspirin since urologic procedure on 03/07/22 Patient otherwise denies any nausea, vomiting, chest pain, shortness of breath, fever, chills, headache, focal weakness, numbness tingling, speech difficulties    Assessment/Plan Principal Problem:   Symptomatic anemia Active Problems:   Malignant neoplasm of bladder, unspecified (HCC)   Hyperlipidemia   Essential hypertension   CAD (coronary artery disease)   S/P CABG x 4   Stenosis of right carotid artery  Symptomatic anemia: Recent admission for similar presentation where he had negative EGD colonoscopy and capsule study demonstrating questionable small AVM.  transfusing 1 unit PRBC, check serial H&H, add PPI twice daily. Check fobt and if positive consider GI consult.No obvious blood loss in urine or stool. Suspect anemia of chronic disease contributing as well.  UTI: Continue ceftriaxone follow-up culture data  Malignant neoplasm of bladder with  hematuria BPH: Continue patient's Flomax. He is supposed to start chemo in a month from urology.  CAD status post CABG HTN HLD Aortic atherosclerosis ICA stenosis with prior history of CVA: He is back on aspirin and Plavix and  were held held for urological procedure and then back on.  CKD stage IIIa creatinine slightly uptrending gently hydrate and monitor  Failure to thrive followed by home health PT RN transition care.  Augment nutritional status.  Severity of Illness: The appropriate patient status for this patient is INPATIENT. Inpatient status is judged to be reasonable and necessary in order to provide the required intensity of service to ensure the patient's safety. The patient's presenting symptoms, physical exam findings, and initial radiographic and laboratory data in the context of their chronic comorbidities is felt to place them at high risk for further clinical deterioration. Furthermore, it is not anticipated that the patient will be medically stable for discharge from the hospital within 2 midnights of admission.   * I certify that at the point of admission it is my clinical judgment that the patient will require inpatient hospital care spanning beyond 2 midnights from the point of admission due to high intensity of service, high risk for further deterioration and high frequency of surveillance required.*   DVT prophylaxis: SCDs Start: 04/01/22 1737  Code Status:   Code Status: Full Code  Family Communication: Admission, patients condition and plan of care including tests being ordered have been discussed with the patient and his wife/ daughter, who indicate understanding and agree with the plan and Code Status.  Consults called: none  Review of Systems: All systems were reviewed and were negative except as mentioned in  HPI above. Negative for fever Negative for chest pain Negative for shortness of breath  Past Medical History:  Diagnosis Date   AAA (abdominal aortic  aneurysm) ([place])    a. 05/2014 [facility]/p stent [facility]).   Bronchitis    CAD (coronary artery disease)    a. 06/2013 VF Arrest/Cath: 3VD, EF 50%;  b. 06/2013 CABG x 4: LIMA->LAD, VG->PDA->RPL, VG->OM.   CVA (cerebral infarction)    Erectile dysfunction    Essential hypertension    GERD (gastroesophageal reflux disease)    H/O hiatal hernia    HOH (hard of hearing)    wears hearing aid in right ear   Hyperlipidemia    Osteoarthritis    Pneumonia    YRS AGO   Stroke [facility])     Past Surgical History:  Procedure Laterality Date   COLONOSCOPY WITH PROPOFOL N/A 02/26/2022   Procedure: COLONOSCOPY WITH PROPOFOL;  Surgeon: Daryel November, MD;  [facility];  Service: Gastroenterology;  Laterality: N/A;   CORONARY ARTERY BYPASS GRAFT N/A 06/27/2013   Procedure: CORONARY ARTERY BYPASS GRAFTING (CABG);  Surgeon: Ivin Poot, MD;  Location: [place];  Service: Open Heart Surgery;  Laterality: N/A;  times four utilizing the left internal mammary artery and the right greater saphenous vein harvested endoscopically   ENDOVEIN HARVEST OF GREATER SAPHENOUS VEIN Right 06/27/2013   Procedure: ENDOVEIN HARVEST OF GREATER SAPHENOUS VEIN;  Surgeon: Ivin Poot, MD;  Location: [place];  Service: Open Heart Surgery;  Laterality: Right;   ESOPHAGOGASTRODUODENOSCOPY (EGD) WITH PROPOFOL N/A 02/25/2022   Procedure: ESOPHAGOGASTRODUODENOSCOPY (EGD) WITH PROPOFOL;  Surgeon: Carol Ada, MD;  [facility];  Service: Gastroenterology;  Laterality: N/A;   EYE SURGERY     bilateral cataracts   GIVENS CAPSULE STUDY N/A 02/26/2022   Procedure: GIVENS CAPSULE STUDY;  Surgeon: Daryel November, MD;  [facility];  Service: Gastroenterology;  Laterality: N/A;   HEMOSTASIS CLIP PLACEMENT  02/26/2022   Procedure: HEMOSTASIS CLIP PLACEMENT;  Surgeon: Daryel November, MD;  [facility];  Service: Gastroenterology;;   [facility] EAR SURGERY Left 1982   INNER EAR SURGERY      severed a nerve in his left ear.    LEFT HEART CATHETERIZATION WITH CORONARY ANGIOGRAM N/A 06/27/2013   Procedure: LEFT HEART CATHETERIZATION WITH CORONARY ANGIOGRAM;  Surgeon: Sinclair Grooms, MD;  Location: [facility] CATH LAB;  Service: Cardiovascular;  Laterality: N/A;  iabp insertion, cpr, intubation, emergent cvts   POLYPECTOMY  02/26/2022   Procedure: POLYPECTOMY;  Surgeon: Daryel November, MD;  [facility];  Service: Gastroenterology;;   TEE WITHOUT CARDIOVERSION N/A 06/27/2013   Procedure: TRANSESOPHAGEAL ECHOCARDIOGRAM (TEE);  Surgeon: Ivin Poot, MD;  Location: [place];  Service: Open Heart Surgery;  Laterality: N/A;   TONSILLECTOMY     TOTAL HIP ARTHROPLASTY Left 09/01/2014   Procedure: TOTAL HIP ARTHROPLASTY;  Surgeon: Kerin Salen, MD;  Location: [place];  Service: Orthopedics;  Laterality: Left;     reports that he has never smoked. He has never used smokeless tobacco. He reports current alcohol use. He reports that he does not use drugs.  No Known Allergies  Family History  Problem Relation Age of Onset   Heart disease Sister    Heart disease Brother    Heart attack Sister    Heart attack Brother    Colon cancer Neg Hx    Hypertension Neg Hx    Stroke Neg Hx  Prior to Admission medications   Medication Sig Start Date End Date Taking? Authorizing Provider  aspirin EC 81 MG tablet Take 1 tablet (81 mg total) by mouth daily. Swallow whole. Hold if  overt external bleeding 02/27/22   Florencia Reasons, MD  atorvastatin (LIPITOR) 40 MG tablet Take 40 mg by mouth daily.    [provider]  clopidogrel (PLAVIX) 75 MG tablet Take 1 tablet (75 mg total) by mouth daily. Hold plavix, resume when ok with urology 02/27/22   Florencia Reasons, MD  furosemide (LASIX) 20 MG tablet Take 1 tablet (20 mg total) by mouth daily as needed (swelling). 03/02/22   Belva Crome, MD  metoprolol tartrate (LOPRESSOR) 25 MG tablet Take 0.5 tablets (12.5 mg total) by mouth 2 (two) times daily. 08/29/13    Belva Crome, MD  nitroGLYCERIN (NITROSTAT) 0.4 MG SL tablet Place 1 tablet (0.4 mg total) under the tongue every 5 (five) minutes as needed for chest pain. Patient not taking: Reported on 02/25/2022 10/29/21   Belva Crome, MD  terazosin (HYTRIN) 5 MG capsule Take 5 mg by mouth at bedtime.    [provider]    Physical Exam: Vitals:   04/01/22 1530 04/01/22 1545 04/01/22 1600 04/01/22 1700  BP: 124/60 119/61  132/65  Pulse: 68 65 71 65  Resp: '18 19 19 16  '$ Temp:      TempSrc:      SpO2: 98% 98% 98% 98%    General exam: AAO x3, NAD, weak appearing. HEENT:Oral mucosa moist, Ear/Nose WNL grossly, dentition normal. Respiratory system: bilaterally clear,no wheezing or crackles,no use of accessory muscle Cardiovascular system: S1 & S2 +, No JVD,. Gastrointestinal system: Abdomen soft, NT,ND, BS+ Nervous System:Alert, awake, moving extremities and grossly nonfocal Extremities: No edema, distal peripheral pulses palpable.  Skin: No rashes,no icterus. MSK: Normal muscle bulk,tone, power  Urine clear in urinal  Labs on Admission: I have personally reviewed following labs and imaging studies  CBC: Recent Labs  Lab 04/01/22 1547  WBC 11.5*  NEUTROABS 8.1*  HGB 6.7*  HCT 22.4*  MCV 85.8  PLT 626   Basic Metabolic Panel: Recent Labs  Lab 04/01/22 1547  NA 143  K 3.5  CL 113*  CO2 23  GLUCOSE 98  BUN 30*  CREATININE 1.39*  CALCIUM 8.4*   GFR: CrCl cannot be calculated (Unknown ideal weight.). Liver Function Tests: Recent Labs  Lab 04/01/22 1547  AST 16  ALT 12  ALKPHOS 48  BILITOT 0.6  PROT 6.2*  ALBUMIN 2.9*   No results for input(s): "LIPASE", "AMYLASE" in the last 168 hours. No results for input(s): "AMMONIA" in the last 168 hours. Coagulation Profile: No results for input(s): "INR", "PROTIME" in the last 168 hours. Cardiac Enzymes: No results for input(s): "CKTOTAL", "CKMB", "CKMBINDEX", "TROPONINI" in the last 168 hours. BNP (last 3  results) No results for input(s): "PROBNP" in the last 8760 hours. HbA1C: No results for input(s): "HGBA1C" in the last 72 hours. CBG: No results for input(s): "GLUCAP" in the last 168 hours. Lipid Profile: No results for input(s): "CHOL", "HDL", "LDLCALC", "TRIG", "CHOLHDL", "LDLDIRECT" in the last 72 hours. Thyroid Function Tests: No results for input(s): "TSH", "T4TOTAL", "FREET4", "T3FREE", "THYROIDAB" in the last 72 hours. Anemia Panel: No results for input(s): "VITAMINB12", "FOLATE", "FERRITIN", "TIBC", "IRON", "RETICCTPCT" in the last 72 hours. Urine analysis:    Component Value Date/Time   COLORURINE YELLOW 04/01/2022 1550   APPEARANCEUR TURBID (A) 04/01/2022 1550   LABSPEC 1.025 04/01/2022  1550   PHURINE 6.0 04/01/2022 1550   GLUCOSEU 100 (A) 04/01/2022 1550   HGBUR LARGE (A) 04/01/2022 1550   BILIRUBINUR NEGATIVE 04/01/2022 1550   KETONESUR NEGATIVE 04/01/2022 1550   PROTEINUR 100 (A) 04/01/2022 1550   UROBILINOGEN 0.2 08/22/2014 1559   NITRITE POSITIVE (A) 04/01/2022 1550   LEUKOCYTESUR MODERATE (A) 04/01/2022 1550    Radiological Exams on Admission: No results found. Antonieta Pert MD [facility]  If 7PM-7AM, please contact night-coverage [email or web address]  04/01/2022, 5:39 PM

## 2022-04-01 NOTE — ED Notes (Signed)
ED TO INPATIENT HANDOFF REPORT  Name/Age/Gender Tyler [name] 88 y.o. male  Code Status Code Status History     Date Active Date Inactive Code Status Order ID Comments User Context   02/25/2022 0053 02/27/2022 2015 Full Code [id number]  Kristopher Oppenheim, DO Inpatient   02/25/2022 0009 02/25/2022 0053 Full Code [id number]  Kristopher Oppenheim, DO Inpatient   03/24/2021 1614 03/30/2021 2038 Full Code [id number]  Sanjuan Dame, MD ED   09/01/2014 1122 09/05/2014 1641 Full Code [id number]  Leighton Parody, PA-C Inpatient   06/30/2013 1421 07/03/2013 1715 Full Code [id number]  Melrose Nakayama, MD Inpatient   06/27/2013 1521 06/30/2013 1421 Full Code [id number]  Caryn Bee Inpatient      Advance Directive Documentation    Flowsheet Row Most Recent Value  Type of Advance Directive Living will, Healthcare Power of Attorney  Pre-existing out of facility DNR order (yellow form or pink MOST form) --  "MOST" Form in Place? --       Home/SNF/Other Home  Chief Complaint Symptomatic anemia [D64.9]  Level of Care/Admitting Diagnosis ED Disposition     ED Disposition  Admit   Condition  --   [place] Nakya: [place] [[id number]]  Level of Care: Telemetry [5]  Admit to tele based on following criteria: Complex arrhythmia (Bradycardia/Tachycardia)  May admit patient to [facility] or [facility] if equivalent level of care is available:: No  Covid Evaluation: Asymptomatic - no recent exposure (last 10 days) testing not required  Diagnosis: Symptomatic anemia [2376283]  Admitting Physician: Antonieta Pert [[id number]]  Attending Physician: Antonieta Pert [[id number]]  Certification:: I certify this patient will need inpatient services for at least 2 midnights  Estimated Length of Stay: 2          Medical History Past Medical History:  Diagnosis Date   AAA (abdominal aortic aneurysm) ([place])    a. 05/2014 s/p stent grafting [facility]).   Bronchitis    CAD (coronary artery  disease)    a. 06/2013 VF Arrest/Cath: 3VD, EF 50%;  b. 06/2013 CABG x 4: LIMA->LAD, VG->PDA->RPL, VG->OM.   CVA (cerebral infarction)    Erectile dysfunction    Essential hypertension    GERD (gastroesophageal reflux disease)    H/O hiatal hernia    HOH (hard of hearing)    wears hearing aid in right ear   Hyperlipidemia    Osteoarthritis    Pneumonia    YRS AGO   Stroke ([place])     Allergies No Known Allergies  IV Location/Drains/Wounds Patient Lines/Drains/Airways Status     Active Line/Drains/Airways     Name Placement date Placement time Site Days   Peripheral IV 04/01/22 20 G Anterior;Distal;Left Forearm 04/01/22  1550  Forearm  less than 1            Labs/Imaging Results for orders placed or performed during the hospital encounter of 04/01/22 (from the past 48 hour(s))  CBC with Differential     Status: Abnormal   Collection Time: 04/01/22  3:47 PM  Result Value Ref Range   WBC 11.5 (H) 4.0 - 10.5 K/uL   RBC 2.61 (L) 4.22 - 5.81 MIL/uL   Hemoglobin 6.7 (LL) 13.0 - 17.0 g/dL    Comment: This critical result has verified and been called to RN C Mateo Flow by Alda Lea on 09 08 2023 at 1628, and has been read back.    HCT 22.4 (L) 39.0 - 52.0 %   MCV 85.8 80.0 -  100.0 fL   MCH 25.7 (L) 26.0 - 34.0 pg   MCHC 29.9 (L) 30.0 - 36.0 g/dL   RDW 18.9 (H) 11.5 - 15.5 %   Platelets 298 150 - 400 K/uL   nRBC 0.2 0.0 - 0.2 %   Neutrophils Relative % 70 %   Neutro Abs 8.1 (H) 1.7 - 7.7 K/uL   Lymphocytes Relative 18 %   Lymphs Abs 2.1 0.7 - 4.0 K/uL   Monocytes Relative 8 %   Monocytes Absolute 0.9 0.1 - 1.0 K/uL   Eosinophils Relative 2 %   Eosinophils Absolute 0.2 0.0 - 0.5 K/uL   Basophils Relative 1 %   Basophils Absolute 0.1 0.0 - 0.1 K/uL   Immature Granulocytes 1 %   Abs Immature Granulocytes 0.07 0.00 - 0.07 K/uL    Comment: Performed at [facility], [place] [address] [place], [place] [address]  Type and screen [facility]     Status: None (Preliminary result)   Collection Time: 04/01/22  3:47 PM  Result Value Ref Range   ABO/RH(D) A NEG    Antibody Screen NEG    Sample Expiration      04/04/2022,2359 Performed at [facility], [place] [address] [place], [place] [address]    Unit Number [id number]    Blood Component Type RBC LR PHER1    Unit division 00    Status of Unit ALLOCATED    Transfusion Status OK TO TRANSFUSE    Crossmatch Result Compatible   Comprehensive metabolic panel     Status: Abnormal   Collection Time: 04/01/22  3:47 PM  Result Value Ref Range   Sodium 143 135 - 145 mmol/L   Potassium 3.5 3.5 - 5.1 mmol/L   Chloride 113 (H) 98 - 111 mmol/L   CO2 23 22 - 32 mmol/L   Glucose, Bld 98 70 - 99 mg/dL    Comment: Glucose reference range applies only to samples taken after fasting for at least 8 hours.   BUN 30 (H) 8 - 23 mg/dL   Creatinine, Ser 1.39 (H) 0.61 - 1.24 mg/dL   Calcium 8.4 (L) 8.9 - 10.3 mg/dL   Total Protein 6.2 (L) 6.5 - 8.1 g/dL   Albumin 2.9 (L) 3.5 - 5.0 g/dL   AST 16 15 - 41 U/L   ALT 12 0 - 44 U/L   Alkaline Phosphatase 48 38 - 126 U/L   Total Bilirubin 0.6 0.3 - 1.2 mg/dL   GFR, Estimated 48 (L) >60 mL/min    Comment: (NOTE) Calculated using the CKD-EPI Creatinine Equation (2021)    Anion gap 7 5 - 15    Comment: Performed at [facility], [place] [address] [place], [place] [address]  Urinalysis, Routine w reflex microscopic Urine, Clean Catch     Status: Abnormal   Collection Time: 04/01/22  3:50 PM  Result Value Ref Range   Color, Urine YELLOW YELLOW   APPearance TURBID (A) CLEAR   Specific Gravity, Urine 1.025 1.005 - 1.030   pH 6.0 5.0 - 8.0   Glucose, UA 100 (A) NEGATIVE mg/dL   Hgb urine dipstick LARGE (A) NEGATIVE   Bilirubin Urine NEGATIVE NEGATIVE   Ketones, ur NEGATIVE NEGATIVE mg/dL   Protein, ur 100 (A) NEGATIVE mg/dL   Nitrite POSITIVE (A) NEGATIVE   Leukocytes,Ua MODERATE (A) NEGATIVE     Comment: Performed at [facility], [place] [address], [place], [place] [address]  Urinalysis, Microscopic (  reflex)     Status: Abnormal   Collection Time: 04/01/22  3:50 PM  Result Value Ref Range   RBC / HPF 6-10 0 - 5 RBC/hpf   WBC, UA >50 0 - 5 WBC/hpf   Bacteria, UA RARE (A) NONE SEEN   Squamous Epithelial / LPF 0-5 0 - 5    Comment: Performed at [facility], [place] [address], [place], [place] [address]  Prepare RBC (crossmatch)     Status: None   Collection Time: 04/01/22  4:38 PM  Result Value Ref Range   Order Confirmation      ORDER PROCESSED BY BLOOD BANK Performed at [facility], [place] [address] [place], [place] [address]    No results found.  Pending Labs [employer] (From admission, onward)     Start     Ordered   Pending  Occult blood card to lab, stool  Once,   R        Pending   Pending  CBC  Tomorrow morning,   R        Pending   Pending  Comprehensive metabolic panel  Tomorrow morning,   R        Pending   Pending  Hemoglobin and hematocrit, blood  Now then every 8 hours,   R      Pending            Vitals/Pain Today's Vitals   04/01/22 1530 04/01/22 1545 04/01/22 1600 04/01/22 1700  BP: 124/60 119/61  132/65  Pulse: 68 65 71 65  Resp: '18 19 19 16  '$ Temp:      TempSrc:      SpO2: 98% 98% 98% 98%    Isolation Precautions No active isolations  Medications Medications  cefTRIAXone (ROCEPHIN) 1 g in sodium chloride 0.9 % 100 mL IVPB (has no administration in time range)  0.9 %  sodium chloride infusion (has no administration in time range)    Mobility walks with person assist

## 2022-04-01 NOTE — Hospital Course (Addendum)
87 year old male with complex medical history including bladder cancer with recent procedure, hyperlipidemia, CKD stage IIIa, CAD with CABG, hypertension/HLD/aortic atherosclerosis/ICA stenosis with prior history of CVA, BPH, failure to thrive presents to the ED with complaint of generalized weakness, polyuria and dysuria for a week.  He was supposed to see PCP today for urine culture but was too weak to go so brought to the ED.Recent admission 8/3 to 8/6 with symptomatic/acute blood loss anemia had negative EGD, colonoscopy and small bowel capsule study with questionable small AVM, Plavix was held, had 2 units transfusion done and was discharged home.  He was not able to void for last 3 days due to dysurea.He denies any black stool or hematuria. No nausea or vomiting. He has been back on plavix and aspirin since urologic procedure on 03/07/22 Patient otherwise denies any nausea, vomiting, chest pain, shortness of breath, fever, chills, headache, focal weakness, numbness tingling, speech difficulties.

## 2022-04-01 NOTE — ED Provider Notes (Signed)
[facility] DEPT Provider Note   CSN: [id number] Arrival date & time: 04/01/22  1404     History  Chief Complaint  Patient presents with   Weakness    Hershall Benkert is a 88 y.o. male.  Patient is a 88 year old male with a past medical history of anemia, bladder cancer status post recent tumor resection in the last month and hypertension presenting to the emergency department with generalized weakness.  Patient is here with his family who states that he has been feeling more weak over the last few days.  They state that he normally ambulates with a walker but has been having difficulty ambulating due to the weakness.  He states that he has also been complaining of dysuria and increased urinary frequency.  Patient denies any fevers or chills, numbness or tingling, chest pain or shortness of breath or abdominal pain.  He denies any black or bloody stools.  The history is provided by the patient and a relative.  Weakness      Home Medications Prior to Admission medications   Medication Sig Start Date End Date Taking? Authorizing Provider  aspirin EC 81 MG tablet Take 1 tablet (81 mg total) by mouth daily. Swallow whole. Hold if  overt external bleeding 02/27/22   Florencia Reasons, MD  atorvastatin (LIPITOR) 40 MG tablet Take 40 mg by mouth daily.    [provider]  clopidogrel (PLAVIX) 75 MG tablet Take 1 tablet (75 mg total) by mouth daily. Hold plavix, resume when ok with urology 02/27/22   Florencia Reasons, MD  furosemide (LASIX) 20 MG tablet Take 1 tablet (20 mg total) by mouth daily as needed (swelling). 03/02/22   Belva Crome, MD  metoprolol tartrate (LOPRESSOR) 25 MG tablet Take 0.5 tablets (12.5 mg total) by mouth 2 (two) times daily. 08/29/13   Belva Crome, MD  nitroGLYCERIN (NITROSTAT) 0.4 MG SL tablet Place 1 tablet (0.4 mg total) under the tongue every 5 (five) minutes as needed for chest pain. Patient not taking: Reported on 02/25/2022 10/29/21   Belva Crome, MD  terazosin (HYTRIN) 5 MG capsule Take 5 mg by mouth at bedtime.    [provider]      Allergies    Patient has no known allergies.    Review of Systems   Review of Systems  Neurological:  Positive for weakness.    Physical Exam Updated Vital Signs BP 132/65   Pulse 65   Temp 97.6 F (36.4 C) (Oral)   Resp 16   SpO2 98%  Physical Exam Vitals and nursing note reviewed.  Constitutional:      Appearance: Normal appearance.  HENT:     Head: Normocephalic and atraumatic.     Mouth/Throat:     Mouth: Mucous membranes are moist.     Pharynx: Oropharynx is clear.  Eyes:     Extraocular Movements: Extraocular movements intact.     Pupils: Pupils are equal, round, and reactive to light.     Comments: Pale conjunctive a  Cardiovascular:     Rate and Rhythm: Normal rate and regular rhythm.     Pulses: Normal pulses.     Heart sounds: Normal heart sounds.  Pulmonary:     Effort: Pulmonary effort is normal.     Breath sounds: Normal breath sounds.  Abdominal:     General: Abdomen is flat.     Palpations: Abdomen is soft.     Tenderness: There is no abdominal tenderness.  Musculoskeletal:        General: Normal range of motion.     Cervical back: Normal range of motion and neck supple.     Right lower leg: No edema.     Left lower leg: No edema.  Skin:    General: Skin is warm and dry.  Neurological:     General: No focal deficit present.     Mental Status: He is alert and oriented to person, place, and time.  Psychiatric:        Mood and Affect: Mood normal.        Behavior: Behavior normal.     ED Results / Procedures / Treatments   Labs (all labs ordered are listed, but only abnormal results are displayed) Labs Reviewed  CBC WITH DIFFERENTIAL/PLATELET - Abnormal; Notable for the following components:      Result Value   WBC 11.5 (*)    RBC 2.61 (*)    Hemoglobin 6.7 (*)    HCT 22.4 (*)    MCH 25.7 (*)    MCHC 29.9 (*)    RDW 18.9 (*)     Neutro Abs 8.1 (*)    All other components within normal limits  COMPREHENSIVE METABOLIC PANEL - Abnormal; Notable for the following components:   Chloride 113 (*)    BUN 30 (*)    Creatinine, Ser 1.39 (*)    Calcium 8.4 (*)    Total Protein 6.2 (*)    Albumin 2.9 (*)    GFR, Estimated 48 (*)    All other components within normal limits  URINALYSIS, ROUTINE W REFLEX MICROSCOPIC - Abnormal; Notable for the following components:   APPearance TURBID (*)    Glucose, UA 100 (*)    Hgb urine dipstick LARGE (*)    Protein, ur 100 (*)    Nitrite POSITIVE (*)    Leukocytes,Ua MODERATE (*)    All other components within normal limits  URINALYSIS, MICROSCOPIC (REFLEX) - Abnormal; Notable for the following components:   Bacteria, UA RARE (*)    All other components within normal limits  POC OCCULT BLOOD, ED  TYPE AND SCREEN  PREPARE RBC (CROSSMATCH)    EKG None  Radiology No results found.  Procedures Procedures    Medications Ordered in ED Medications  cefTRIAXone (ROCEPHIN) 1 g in sodium chloride 0.9 % 100 mL IVPB (has no administration in time range)  0.9 %  sodium chloride infusion (has no administration in time range)    ED Course/ Medical Decision Making/ A&P Clinical Course as of 04/01/22 1708  Fri Apr 01, 2022  1659 Colonoscopy in June [VK]    Clinical Course User Index [VK] Ottie Glazier, DO                           Medical Decision Making This patient presents to the ED with chief complaint(s) of weakness and dysuria with pertinent past medical history of anemia, hypertension and bladder cancer which further complicates the presenting complaint. The complaint involves an extensive differential diagnosis and also carries with it a high risk of complications and morbidity.    The differential diagnosis includes anemia, ACS, arrhythmia, electrolyte abnormality, dehydration, patient has no focal neurologic deficits making CVA unlikely, UTI,  sepsis  Additional history obtained: Additional history obtained from family Records reviewed previous admission documents and colonoscopy records with polyps in June 2023  ED Course and Reassessment: Upon patient's arrival, he is awake and  alert resting bed comfortably no acute distress he has no focal neurologic deficits making CVA unlikely.  He is pale appearing concerning for anemia.  He will additionally have labs to evaluate for arrhythmia, UTI, ischemia as causes of his symptoms  Independent labs interpretation:  The following labs were independently interpreted: UA positive for UTI, anemia requiring blood transfusion  Independent visualization of imaging: N/A  Consultation: - Consulted or discussed management/test interpretation w/ external professional: Hospitalist  Consideration for admission or further workup: Patient requires admission for blood transfusion and treatment of face UTI in the setting of his weakness and difficulty ambulating may also require a GI consult due to his recurrent anemia Social Determinants of health: N/A    Amount and/or Complexity of Data Reviewed Labs: ordered.  Risk Prescription drug management. Decision regarding hospitalization.          Final Clinical Impression(s) / ED Diagnoses Final diagnoses:  Anemia, unspecified type  Acute cystitis with hematuria    Rx / DC Orders ED Discharge Orders     None         Ottie Glazier, DO 04/01/22 1711

## 2022-04-01 NOTE — ED Notes (Signed)
Blood bank has blood cells ready for this patient, informed Junior,RN.

## 2022-04-01 NOTE — ED Triage Notes (Signed)
Per EMS, patient from home, c/o generalized weakness with polyuria and dysuria x1 week. Was supposed to go to PCP today for urine culture but too weak to go. Daughter is caregiver for patient.   20g L FA 259m NS  BP 117/56 HR 84 97% RA CBG 118 RR 20

## 2022-04-01 NOTE — ED Notes (Signed)
Admitting Dr. At bedside

## 2022-04-02 ENCOUNTER — Encounter (HOSPITAL_COMMUNITY): Payer: Self-pay | Admitting: Internal Medicine

## 2022-04-02 ENCOUNTER — Other Ambulatory Visit: Payer: Self-pay

## 2022-04-02 LAB — COMPREHENSIVE METABOLIC PANEL
ALT: 11 U/L (ref 0–44)
AST: 32 U/L (ref 15–41)
Albumin: 2.9 g/dL — ABNORMAL LOW (ref 3.5–5.0)
Alkaline Phosphatase: 44 U/L (ref 38–126)
Anion gap: 5 (ref 5–15)
BUN: 31 mg/dL — ABNORMAL HIGH (ref 8–23)
CO2: 23 mmol/L (ref 22–32)
Calcium: 8 mg/dL — ABNORMAL LOW (ref 8.9–10.3)
Chloride: 113 mmol/L — ABNORMAL HIGH (ref 98–111)
Creatinine, Ser: 1.39 mg/dL — ABNORMAL HIGH (ref 0.61–1.24)
GFR, Estimated: 48 mL/min — ABNORMAL LOW (ref >60)
Glucose, Bld: 115 mg/dL — ABNORMAL HIGH (ref 70–99)
Potassium: 4 mmol/L (ref 3.5–5.1)
Sodium: 141 mmol/L (ref 135–145)
Total Bilirubin: 1.1 mg/dL (ref 0.3–1.2)
Total Protein: 6 g/dL — ABNORMAL LOW (ref 6.5–8.1)

## 2022-04-02 LAB — CBC
HCT: 25.3 % — ABNORMAL LOW (ref 39.0–52.0)
Hemoglobin: 7.5 g/dL — ABNORMAL LOW (ref 13.0–17.0)
MCH: 26.6 pg (ref 26.0–34.0)
MCHC: 29.6 g/dL — ABNORMAL LOW (ref 30.0–36.0)
MCV: 89.7 fL (ref 80.0–100.0)
Platelets: 267 10*3/uL (ref 150–400)
RBC: 2.82 MIL/uL — ABNORMAL LOW (ref 4.22–5.81)
RDW: 18.6 % — ABNORMAL HIGH (ref 11.5–15.5)
WBC: 10.3 10*3/uL (ref 4.0–10.5)
nRBC: 0.2 % (ref 0.0–0.2)

## 2022-04-02 LAB — BASIC METABOLIC PANEL
Anion gap: 6 (ref 5–15)
BUN: 27 mg/dL — ABNORMAL HIGH (ref 8–23)
CO2: 23 mmol/L (ref 22–32)
Calcium: 7.9 mg/dL — ABNORMAL LOW (ref 8.9–10.3)
Chloride: 113 mmol/L — ABNORMAL HIGH (ref 98–111)
Creatinine, Ser: 1.32 mg/dL — ABNORMAL HIGH (ref 0.61–1.24)
GFR, Estimated: 51 mL/min — ABNORMAL LOW (ref >60)
Glucose, Bld: 85 mg/dL (ref 70–99)
Potassium: 3.4 mmol/L — ABNORMAL LOW (ref 3.5–5.1)
Sodium: 142 mmol/L (ref 135–145)

## 2022-04-02 LAB — HEMOGLOBIN AND HEMATOCRIT, BLOOD
HCT: 23.4 % — ABNORMAL LOW (ref 39.0–52.0)
HCT: 29 % — ABNORMAL LOW (ref 39.0–52.0)
Hemoglobin: 7.1 g/dL — ABNORMAL LOW (ref 13.0–17.0)
Hemoglobin: 8.7 g/dL — ABNORMAL LOW (ref 13.0–17.0)

## 2022-04-02 LAB — PREPARE RBC (CROSSMATCH)

## 2022-04-02 LAB — OCCULT BLOOD X 1 CARD TO LAB, STOOL: Fecal Occult Bld: POSITIVE — AB

## 2022-04-02 MED ORDER — SODIUM CHLORIDE 0.9% IV SOLUTION: Freq: Once | INTRAVENOUS | Status: AC

## 2022-04-02 MED ORDER — POTASSIUM CHLORIDE CRYS ER 20 MEQ PO TBCR
20.0000 meq | EXTENDED_RELEASE_TABLET | Freq: Once | ORAL | Status: AC
  Administered 2022-04-02: 20 meq via ORAL
  Filled 2022-04-02: qty 1

## 2022-04-02 NOTE — Progress Notes (Signed)
PROGRESS NOTE Tyler [name]  AYT:[id number] DOB: December 30, 1928 DOA: 04/01/2022 PCP: [name], MD   Brief Narrative/Hospital Course: 88 year old male with complex medical history including bladder cancer with recent procedure, hyperlipidemia, CKD stage IIIa, CAD with CABG, hypertension/HLD/aortic atherosclerosis/ICA stenosis with prior history of CVA, BPH, failure to thrive presents to the ED with complaint of generalized weakness, polyuria and dysuria for a week.  He was supposed to see PCP today for urine culture but was too weak to go so brought to the ED.Recent admission 8/3 to 8/6 with symptomatic/acute blood loss anemia had negative EGD, colonoscopy and small bowel capsule study with questionable small AVM, Plavix was held, had 2 units transfusion done and was discharged home.  He was not able to void for last 3 days due to dysurea.He denies any black stool or hematuria. No nausea or vomiting. He has been back on plavix and aspirin since urologic procedure on 03/07/22 Patient otherwise denies any nausea, vomiting, chest pain, shortness of breath, fever, chills, headache, focal weakness, numbness tingling, speech difficulties.      Subjective: Seen and examined.  Daughter at the bedside.  No new complaints He had a large greenish pulm and later on sent for Hemoccult No new complaints  Assessment and Plan: Principal Problem:   Symptomatic anemia Active Problems:   Malignant neoplasm of bladder, unspecified (HCC)   Hyperlipidemia   Essential hypertension   CAD (coronary artery disease)   S/P CABG x 4   Stenosis of right carotid artery   Symptomatic anemia: Recent admission for similar presentation where he had negative EGD colonoscopy and capsule study demonstrating questionable small AVM.  s/p1 unit PRBC-hemoglobin still low at 7.1-given his cardiac history and that asymptomatic discussed with patient and daughter agreeable for 1 more unit of PRBC after explained risk benefits and  alternatives, will do 1 unitr prbc  Hemoccult pending, continue ppi twice daily.No obvious bleeding but postprocedure hematuria following cystoscopy for a few days but urine is clear now.Suspect anemia of chronic disease contributing as well. Recent Labs  Lab 04/01/22 1547 04/01/22 2357 04/02/22 0558  HGB 6.7* 7.5* 7.1*  HCT 22.4* 25.3* 23.4*     UTI: Continue ceftriaxone follow-up culture data-urine culture sent this morning   Malignant neoplasm of bladder with hematuria BPH: Continue patient's Flomax.He is supposed to start chemo in a month from urology.   CAD status post CABG HTN HLD Aortic atherosclerosis ICA stenosis with prior history of CVA: Continue aspirin holding Plavix for now-resume if negative HEMOCCULT,no chest pain.No new focal weakness.    CKD stage IIIa creatinine slightly uptrending? His baseline, cont gentle ivf Recent Labs  Lab 04/01/22 1547 04/01/22 2357  BUN 30* 31*  CREATININE 1.39* 1.39*     Failure to thrive followed by home health PT RN transition care.  Augment nutritional status  DVT prophylaxis: SCDs Start: 04/01/22 1737 Code Status:   Code Status: Full Code Family Communication: plan of care discussed with patient/daughter at bedside. Patient status is: Inpatient because of ongoing management of anemia UTI Level of care: Telemetry  Dispo: The patient is from: Home            Anticipated disposition: Home in next 2 days  Mobility Assessment (last 72 hours)     Mobility Assessment     Row Name 04/02/22 0834 04/01/22 2027         Does patient have an order for bedrest or is patient medically unstable No - Continue assessment No - Continue assessment  What is the highest level of mobility based on the progressive mobility assessment? Level 3 (Stands with assist) - Balance while standing  and cannot march in place Level 4 (Walks with assist in room) - Balance while marching in place and cannot step forward and back - Complete      Is the  above level different from baseline mobility prior to current illness? Yes - Recommend PT order --              Objective: Vitals last 24 hrs: Vitals:   04/01/22 2011 04/02/22 0236 04/02/22 0455 04/02/22 1018  BP: (!) 115/54 122/70 132/62 135/64  Pulse: 65 76 74 76  Resp: 20 (!) 21 16   Temp: 98.1 F (36.7 C) 98.3 F (36.8 C) 97.8 F (36.6 C)   TempSrc: Oral Oral Oral   SpO2: 99% 97% 98%    Weight change:   Physical Examination: General exam: alert awake, hard of hearing, older than stated age, weak appearing. HEENT:Oral mucosa moist, Ear/Nose WNL grossly, dentition normal. Respiratory system: bilaterally diminished BS, no use of accessory muscle Cardiovascular system: S1 & S2 +, No JVD. Gastrointestinal system: Abdomen soft,NT,ND, BS+ Nervous System:Alert, awake, moving extremities and grossly nonfocal Extremities: LE edema neg,distal peripheral pulses palpable.  Skin: No rashes,no icterus. MSK: Normal muscle bulk,tone, power  Medications reviewed:  Scheduled Meds:  sodium chloride   Intravenous Once   aspirin EC  81 mg Oral Daily   atorvastatin  40 mg Oral q1800   metoprolol tartrate  12.5 mg Oral BID   pantoprazole (PROTONIX) IV  40 mg Intravenous Q12H   terazosin  5 mg Oral QHS   Continuous Infusions:  sodium chloride 30 mL/hr at 04/01/22 2027   cefTRIAXone (ROCEPHIN)  IV Stopped (04/01/22 2305)    Diet Order             Diet clear liquid Room service appropriate? Yes; Fluid consistency: Thin  Diet effective now                  Intake/Output Summary (Last 24 hours) at 04/02/2022 1101 Last data filed at 04/02/2022 1031 Gross per 24 hour  Intake 906.26 ml  Output 551 ml  Net 355.26 ml   Net IO Since Admission: 355.26 mL [04/02/22 1101]  Wt Readings from Last 3 Encounters:  02/27/22 102.3 kg  02/08/22 104.3 kg  11/09/21 104.3 kg     Unresulted Labs (From admission, onward)     Start     Ordered   04/02/22 1300  Hemoglobin and hematocrit, blood   Now then every 8 hours,   R      04/02/22 0801   04/02/22 0900  Urine Culture  (Urine Culture)  ONCE - URGENT,   URGENT       Question:  Indication  Answer:  Dysuria   04/02/22 0859   04/02/22 9323  Basic metabolic panel  Once,   R        04/02/22 0738   04/01/22 1737  Occult blood card to lab, stool  Once,   R        04/01/22 1736          Data Reviewed: I have personally reviewed following labs and imaging studies CBC: Recent Labs  Lab 04/01/22 1547 04/01/22 2357 04/02/22 0558  WBC 11.5* 10.3  --   NEUTROABS 8.1*  --   --   HGB 6.7* 7.5* 7.1*  HCT 22.4* 25.3* 23.4*  MCV 85.8 89.7  --  PLT 298 267  --    Basic Metabolic Panel: Recent Labs  Lab 04/01/22 1547 04/01/22 2357  NA 143 141  K 3.5 4.0  CL 113* 113*  CO2 23 23  GLUCOSE 98 115*  BUN 30* 31*  CREATININE 1.39* 1.39*  CALCIUM 8.4* 8.0*   GFR: CrCl cannot be calculated (Unknown ideal weight.). Liver Function Tests: Recent Labs  Lab 04/01/22 1547 04/01/22 2357  AST 16 32  ALT 12 11  ALKPHOS 48 44  BILITOT 0.6 1.1  PROT 6.2* 6.0*  ALBUMIN 2.9* 2.9*   No results found for this or any previous visit (from the past 240 hour(s)).  Antimicrobials: Anti-infectives (From admission, onward)    Start     Dose/Rate Route Frequency Ordered Stop   04/01/22 1800  cefTRIAXone (ROCEPHIN) 1 g in sodium chloride 0.9 % 100 mL IVPB        1 g 200 mL/hr over 30 Minutes Intravenous Every 24 hours 04/01/22 1736 04/06/22 1759   04/01/22 1630  cefTRIAXone (ROCEPHIN) 1 g in sodium chloride 0.9 % 100 mL IVPB  Status:  Discontinued        1 g 200 mL/hr over 30 Minutes Intravenous  Once 04/01/22 1628 04/01/22 1737      Culture/Microbiology    Component Value Date/Time   SDES  02/25/2022 1547    URINE, CLEAN CATCH Performed at [facility], [place] [address] [place], [place] [address]    SPECREQUEST  02/25/2022 1547    NONE Performed at [facility], [place] [address],  [place], [place] [address]    CULT (A) 02/25/2022 1547    <10,000 COLONIES/mL INSIGNIFICANT GROWTH Performed at [place] [address], [place],  [address]    REPTSTATUS 02/27/2022 FINAL 02/25/2022 1547    Radiology Studies: No results found.   LOS: 1 day   Antonieta Pert, MD [facility]  04/02/2022, 11:01 AM

## 2022-04-03 LAB — BASIC METABOLIC PANEL
Anion gap: 4 — ABNORMAL LOW (ref 5–15)
BUN: 19 mg/dL (ref 8–23)
CO2: 23 mmol/L (ref 22–32)
Calcium: 8.2 mg/dL — ABNORMAL LOW (ref 8.9–10.3)
Chloride: 116 mmol/L — ABNORMAL HIGH (ref 98–111)
Creatinine, Ser: 1.32 mg/dL — ABNORMAL HIGH (ref 0.61–1.24)
GFR, Estimated: 51 mL/min — ABNORMAL LOW (ref >60)
Glucose, Bld: 91 mg/dL (ref 70–99)
Potassium: 3.9 mmol/L (ref 3.5–5.1)
Sodium: 143 mmol/L (ref 135–145)

## 2022-04-03 LAB — CBC
HCT: 28.2 % — ABNORMAL LOW (ref 39.0–52.0)
Hemoglobin: 8.6 g/dL — ABNORMAL LOW (ref 13.0–17.0)
MCH: 26.2 pg (ref 26.0–34.0)
MCHC: 30.5 g/dL (ref 30.0–36.0)
MCV: 86 fL (ref 80.0–100.0)
Platelets: 244 10*3/uL (ref 150–400)
RBC: 3.28 MIL/uL — ABNORMAL LOW (ref 4.22–5.81)
RDW: 18.7 % — ABNORMAL HIGH (ref 11.5–15.5)
WBC: 8.4 10*3/uL (ref 4.0–10.5)
nRBC: 0 % (ref 0.0–0.2)

## 2022-04-03 MED ORDER — PANTOPRAZOLE SODIUM 40 MG IV SOLR
40.0000 mg | INTRAVENOUS | Status: DC
  Administered 2022-04-03 – 2022-04-04 (×2): 40 mg via INTRAVENOUS
  Filled 2022-04-03 (×2): qty 10

## 2022-04-03 MED ORDER — FERROUS GLUCONATE 324 (38 FE) MG PO TABS
324.0000 mg | ORAL_TABLET | Freq: Two times a day (BID) | ORAL | Status: DC
  Administered 2022-04-03 – 2022-04-05 (×3): 324 mg via ORAL
  Filled 2022-04-03 (×6): qty 1

## 2022-04-03 MED ORDER — CLOPIDOGREL BISULFATE 75 MG PO TABS
75.0000 mg | ORAL_TABLET | Freq: Every day | ORAL | Status: DC
  Administered 2022-04-03 – 2022-04-04 (×2): 75 mg via ORAL
  Filled 2022-04-03 (×2): qty 1

## 2022-04-03 NOTE — Plan of Care (Signed)

## 2022-04-03 NOTE — Evaluation (Signed)
Physical Therapy Evaluation Patient Details Name: Tyler [name] MRN: [id number] DOB: 08/07/1928 Today's Date: 04/03/2022  History of Present Illness  88 year old white male with a history of bladder cancer, hyperlipidemia, CKD stage IIIa, coronary disease status post CABG, hypertension admitted with symptomatic anemia with Hgb 6.7 - pt with admit earlier this month with Hgb 5.9  Clinical Impression  Pt admitted as above and presenting with functional mobility limitations 2* generalized weakness, balance deficits and poor endurance.  Pt very motivated and hopes to progress to dc home with significant other.  Pt would  benefit from follow up HHPT to further address deficits and maximize IND and safety at home.     Recommendations for follow up therapy are one component of a multi-disciplinary discharge planning process, led by the attending physician.  Recommendations may be updated based on patient status, additional functional criteria and insurance authorization.  Follow Up Recommendations Home health PT      Assistance Recommended at Discharge Intermittent Supervision/Assistance  Patient can return home with the following  A little help with bathing/dressing/bathroom;A little help with walking and/or transfers;Assist for transportation;Help with stairs or ramp for entrance;Assistance with cooking/housework    Equipment Recommendations None recommended by PT  Recommendations for Other Services       Functional Status Assessment Patient has had a recent decline in their functional status and demonstrates the ability to make significant improvements in function in a reasonable and predictable amount of time.     Precautions / Restrictions Precautions Precautions: Fall Restrictions Weight Bearing Restrictions: No      Mobility  Bed Mobility Overal bed mobility: Needs Assistance Bed Mobility: Supine to Sit     Supine to sit: Min assist     General bed mobility comments:  Increased time with HOB elevated and use of bed rail (has bedrail at home but not adjustable bed); Assist to complete rotation to EOB sitting using bed pad    Transfers Overall transfer level: Needs assistance Equipment used: Rolling walker (2 wheels) Transfers: Sit to/from Stand Sit to Stand: Min assist, From elevated surface           General transfer comment: cues for LE management and use of UE's; physical assist to bring wt up and fwd and to balance in initial standing with RW    Ambulation/Gait Ambulation/Gait assistance: Min assist Gait Distance (Feet): 38 Feet Assistive device: Rolling walker (2 wheels) Gait Pattern/deviations: Step-to pattern, Step-through pattern, Decreased step length - right, Decreased step length - left, Shuffle, Antalgic, Trunk flexed Gait velocity: decr     General Gait Details: Steady assist with cues for posture, position from [employer]            Wheelchair Mobility    Modified Rankin (Stroke Patients Only)       Balance Overall balance assessment: Needs assistance Sitting-balance support: Feet supported, No upper extremity supported Sitting balance-Leahy Scale: Fair     Standing balance support: Bilateral upper extremity supported Standing balance-Leahy Scale: Poor                               Pertinent Vitals/Pain Pain Assessment Pain Assessment: No/denies pain    Home Living Family/patient expects to be discharged to:: Private residence Living Arrangements: Spouse/significant other Available Help at Discharge: Available 24 hours/day Type of Home: House Home Access: Stairs to enter Entrance Stairs-Rails: Can reach both;Right;Left Entrance Stairs-Number of Steps: 3   Home Layout: One  level Home Equipment: Conservation officer, nature (2 wheels);Shower seat;Grab bars - toilet;Grab bars - tub/shower;Wheelchair - manual;BSC/3in1 Additional Comments: Daughter has a [employer] Service fast streamer from another family member she can bring in.  They also have a lift chair, but don't use it as a lift chair at the moment.    Prior Function Prior Level of Function : Independent/Modified Independent             Mobility Comments: Amb short household distances with RW, primarily bed to chair and the bathroom. S for longer distances and uses w/c for appointments.       Hand Dominance   Dominant Hand: Right    Extremity/Trunk Assessment   Upper Extremity Assessment Upper Extremity Assessment: Generalized weakness    Lower Extremity Assessment Lower Extremity Assessment: Generalized weakness       Communication   Communication: HOH  Cognition Arousal/Alertness: Awake/alert Behavior During Therapy: WFL for tasks assessed/performed Overall Cognitive Status: Within Functional Limits for tasks assessed                                          General Comments      Exercises General Exercises - Lower Extremity Ankle Circles/Pumps: AROM, Both, 15 reps, Supine Quad Sets: AROM, Both, 5 reps, Supine   Assessment/Plan    PT Assessment Patient needs continued PT services  PT Problem List Decreased strength;Decreased activity tolerance;Decreased balance;Decreased mobility;Decreased knowledge of use of DME;Obesity       PT Treatment Interventions DME instruction;Gait training;Stair training;Functional mobility training;Therapeutic activities;Therapeutic exercise;Patient/family education;Balance training    PT Goals (Current goals can be found in the Care Plan section)  Acute Rehab PT Goals Patient Stated Goal: HOME PT Goal Formulation: With patient Time For Goal Achievement: 04/10/22 Potential to Achieve Goals: Good    Frequency Min 3X/week     Co-evaluation               AM-PAC PT "6 Clicks" Mobility  Outcome Measure Help needed turning from your back to your side while in a flat bed without using bedrails?: A Lot Help needed moving from lying on your back to sitting on the side of a  flat bed without using bedrails?: A Lot Help needed moving to and from a bed to a chair (including a wheelchair)?: A Little Help needed standing up from a chair using your arms (e.g., wheelchair or bedside chair)?: A Little Help needed to walk in hospital room?: A Little Help needed climbing 3-5 steps with a railing? : A Lot 6 Click Score: 15    End of Session Equipment Utilized During Treatment: Gait belt Activity Tolerance: Patient tolerated treatment well Patient left: in chair;with call bell/phone within reach;with chair alarm set;with family/visitor present Nurse Communication: Mobility status PT Visit Diagnosis: Unsteadiness on feet (R26.81);Muscle weakness (generalized) (M62.81);Difficulty in walking, not elsewhere classified (R26.2)    Time: 7793-9030 PT Time Calculation (min) (ACUTE ONLY): 32 min   Charges:   PT Evaluation $PT Eval Low Complexity: 1 Low PT Treatments $Gait Training: 8-22 mins        [place] Pager [phone] Office [phone]   Marley Pakula 04/03/2022, 4:20 PM

## 2022-04-03 NOTE — Consult Note (Signed)
[facility] Consult  Referring Provider: [facility] hospitalist Primary Care Physician:  [name], MD Primary Gastroenterologist: Althia Forts  Reason for Consultation: Symptomatic anemia  HPI: Tyler [name] is a 88 y.o. male with medical history of bladder cancer, recent cystoscopy and tumor removal, on Plavix for carotid stenosis, on aspirin for coronary artery disease with CABG, was brought to the ER with outpatient labs showing severe anemia. Most of the history is obtained from the patient's daughter present at bedside who states that for the last several days she noticed that her dad looked unusually pale and weak and subsequently had blood work done which showed anemia and was advised to proceed to the ER.  Patient has been having dark green stools without any bright red blood or black stools.  He denies abdominal pain, nausea or vomiting.  He was recently started on ferrous sulfate which she takes once a day.  He is not on an acid suppressing medicine/PPI.  He denies acid reflux or heartburn, difficulty swallowing or pain on swallowing, unintentional weight loss or loss of appetite.   Recent GI work-up for anemia, FOBT positive stool includes: EGD 02/25/2022, Dr. Benson Norway: Unremarkable Colonoscopy 02/26/2022, Dr. 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 05/2014 s/p stent grafting [facility]).   Bronchitis    CAD (coronary artery disease)    a. 06/2013 VF Arrest/Cath: 3VD, EF 50%;  b. 06/2013 CABG x 4: LIMA->LAD, VG->PDA->RPL, VG->OM.   CVA (cerebral infarction)    Erectile dysfunction    Essential hypertension    GERD (gastroesophageal reflux disease)    H/O hiatal hernia    HOH (hard of hearing)    wears hearing aid in right ear   Hyperlipidemia     Osteoarthritis    Pneumonia    YRS AGO   Stroke [facility])     Past Surgical History:  Procedure Laterality Date   COLONOSCOPY WITH PROPOFOL N/A 02/26/2022   Procedure: COLONOSCOPY WITH PROPOFOL;  Surgeon: Daryel November, MD;  Location: [facility];  Service: Gastroenterology;  Laterality: N/A;   CORONARY ARTERY BYPASS GRAFT N/A 06/27/2013   Procedure: CORONARY ARTERY BYPASS GRAFTING (CABG);  Surgeon: Ivin Poot, MD;  Location: [place];  Service: Open Heart Surgery;  Laterality: N/A;  times four utilizing the left internal mammary artery and the right greater saphenous vein harvested endoscopically   ENDOVEIN HARVEST OF GREATER SAPHENOUS VEIN Right 06/27/2013   Procedure: ENDOVEIN HARVEST OF GREATER SAPHENOUS VEIN;  Surgeon: Ivin Poot, MD;  Location: [place];  Service: Open Heart Surgery;  Laterality: Right;   ESOPHAGOGASTRODUODENOSCOPY (EGD) WITH PROPOFOL N/A 02/25/2022   Procedure: ESOPHAGOGASTRODUODENOSCOPY (EGD) WITH PROPOFOL;  Surgeon: Carol Ada, MD;  Location: [facility];  Service: Gastroenterology;  Laterality: N/A;   EYE SURGERY     bilateral cataracts   GIVENS CAPSULE STUDY N/A 02/26/2022   Procedure: GIVENS CAPSULE STUDY;  Surgeon: Daryel November, MD;  Location: [facility];  Service: Gastroenterology;  Laterality: N/A;   HEMOSTASIS CLIP PLACEMENT  02/26/2022   Procedure: HEMOSTASIS CLIP PLACEMENT;  Surgeon: Daryel November, MD;  Location: [facility];  Service: Gastroenterology;;   [facility] EAR SURGERY Left 1982   INNER EAR SURGERY     severed a nerve in his left ear.    LEFT HEART CATHETERIZATION WITH CORONARY ANGIOGRAM N/A  06/27/2013   Procedure: LEFT HEART CATHETERIZATION WITH CORONARY ANGIOGRAM;  Surgeon: Sinclair Grooms, MD;  Location[facility] C[facility] LAB;  Service: Cardiovascular;  Laterality: N/A;  iabp insertion, cpr, intubation, emergent cvts   POLYPECTOMY  02/26/2022   Procedure: POLYPECTOMY;  Surgeon: Daryel November, MD;  [facility];  Service:  Gastroenterology;;   TEE WITHOUT CARDIOVERSION N/A 06/27/2013   Procedure: TRANSESOPHAGEAL ECHOCARDIOGRAM (TEE);  Surgeon: Ivin Poot, MD;  Location: [place];  Service: Open Heart Surgery;  Laterality: N/A;   TONSILLECTOMY     TOTAL HIP ARTHROPLASTY Left 09/01/2014   Procedure: TOTAL HIP ARTHROPLASTY;  Surgeon: Kerin Salen, MD;  Location: [place];  Service: Orthopedics;  Laterality: Left;    Prior to Admission medications   Medication Sig Start Date End Date Taking? Authorizing Provider  ALEVE 220 MG tablet Take 220-440 mg by mouth 2 (two) times daily as needed (for pain).   Yes [provider]  aspirin EC 81 MG tablet Take 1 tablet (81 mg total) by mouth daily. Swallow whole. Hold if  overt external bleeding 02/27/22  Yes Florencia Reasons, MD  atorvastatin (LIPITOR) 40 MG tablet Take 40 mg by mouth at bedtime.   Yes [provider]  clopidogrel (PLAVIX) 75 MG tablet Take 1 tablet (75 mg total) by mouth daily. Hold plavix, resume when ok with urology Patient taking differently: Take 75 mg by mouth in the morning. 02/27/22  Yes Florencia Reasons, MD  ferrous gluconate (FERGON) 324 MG tablet Take 324 mg by mouth daily with breakfast.   Yes [provider]  furosemide (LASIX) 20 MG tablet Take 1 tablet (20 mg total) by mouth daily as needed (swelling). Patient taking differently: Take 20 mg by mouth daily as needed (for swelling). 03/02/22  Yes Belva Crome, MD  metoprolol tartrate (LOPRESSOR) 25 MG tablet Take 0.5 tablets (12.5 mg total) by mouth 2 (two) times daily. 08/29/13  Yes Belva Crome, MD  nitroGLYCERIN (NITROSTAT) 0.4 MG SL tablet Place 1 tablet (0.4 mg total) under the tongue every 5 (five) minutes as needed for chest pain. 10/29/21  Yes Belva Crome, MD  terazosin (HYTRIN) 5 MG capsule Take 5 mg by mouth at bedtime.   Yes [provider]    Current Facility-Administered Medications  Medication Dose Route Frequency Provider Last Rate Last Admin   0.9 %  sodium  chloride infusion   Intravenous Continuous Kc, Ramesh, MD 30 mL/hr at 04/03/22 0253 New Bag at 04/03/22 0253   acetaminophen (TYLENOL) tablet 650 mg  650 mg Oral Q6H PRN Kc, [place]aren [place], MD       Or   acetaminophen (TYLENOL) suppository 650 mg  650 mg Rectal Q6H PRN Kc, Ramesh, MD       aspirin EC tablet 81 mg  81 mg Oral Daily Kc, Ramesh, MD   81 mg at 04/02/22 1018   atorvastatin (LIPITOR) tablet 40 mg  40 mg Oral q1800 Kc, Ramesh, MD   40 mg at 04/02/22 2054   cefTRIAXone (ROCEPHIN) 1 g in sodium chloride 0.9 % 100 mL IVPB  1 g Intravenous Q24H Kc, Ramesh, MD 200 mL/hr at 04/02/22 1709 1 g at 04/02/22 1709   metoprolol tartrate (LOPRESSOR) tablet 12.5 mg  12.5 mg Oral BID Kc, Ramesh, MD   12.5 mg at 04/02/22 1018   ondansetron (ZOFRAN) tablet 4 mg  4 mg Oral Q6H PRN Antonieta Pert, MD       Or   ondansetron (ZOFRAN) injection  4 mg  4 mg Intravenous Q6H PRN Kc, Ramesh, MD       pantoprazole (PROTONIX) injection 40 mg  40 mg Intravenous Q12H Kc, Maren [place], MD   40 mg at 04/02/22 2054   terazosin (HYTRIN) capsule 5 mg  5 mg Oral QHS Kc, Maren [place], MD   5 mg at 04/02/22 2055    Allergies as of 04/01/2022   (No Known Allergies)    Family History  Problem Relation Age of Onset   Heart disease Sister    Heart disease Brother    Heart attack Sister    Heart attack Brother    Colon cancer Neg Hx    Hypertension Neg Hx    Stroke Neg Hx     Social History   Socioeconomic History   Marital status: Married    Spouse name: Not on file   Number of children: Not on file   Years of education: Not on file   Highest education level: Not on file  Occupational History   Not on file  Tobacco Use   Smoking status: Never   Smokeless tobacco: Never  Vaping Use   Vaping Use: Never used  Substance and Sexual Activity   Alcohol use: Yes    Comment: Rare   Drug use: No   Sexual activity: Not Currently  Other Topics Concern   Not on file  Social History Narrative   Not on file   Social Determinants of  Health   Financial Resource Strain: Not on file  Food Insecurity: No Food Insecurity (04/01/2022)   Hunger Vital Sign    Worried About Running Out of Food in the Last Year: Never true    Ran Out of Food in the Last Year: Never true  Transportation Needs: No Transportation Needs (04/01/2022)   PRAPARE - Hydrologist (Medical): No    Lack of Transportation (Non-Medical): No  Physical Activity: Not on file  Stress: Not on file  Social Connections: Not on file  Intimate Partner Violence: Not At Risk (04/01/2022)   Humiliation, Afraid, Rape, and Kick questionnaire    Fear of Current or Ex-Partner: No    Emotionally Abused: No    Physically Abused: No    Sexually Abused: No    Review of Systems: Positive for: GI: Described in detail in HPI.    Gen: weakness,  denies any fever, chills, rigors, night sweats, anorexia, fatigue, malaise, involuntary weight loss, and sleep disorder CV: Denies chest pain, angina, palpitations, syncope, orthopnea, PND, peripheral edema, and claudication. Resp: Denies dyspnea, cough, sputum, wheezing, coughing up blood. GU : Recurrent UTI, recent bladder tumor removal MS: Denies joint pain or swelling.  Denies muscle weakness, cramps, atrophy.  Derm: Denies rash, itching, oral ulcerations, hives, unhealing ulcers.  Psych: Denies depression, anxiety, memory loss, suicidal ideation, hallucinations,  and confusion. Heme: Denies bruising, bleeding, and enlarged lymph nodes. Neuro:  Denies any headaches, dizziness, paresthesias. Endo:  Denies any problems with DM, thyroid, adrenal function.  Physical Exam: Vital signs in last 24 hours: Temp:  [97.5 F (36.4 C)-98.5 F (36.9 C)] 97.5 F (36.4 C) (09/10 0506) Pulse Rate:  [65-83] 83 (09/10 0506) Resp:  [14-18] 17 (09/10 0506) BP: (122-142)/(57-71) 133/71 (09/10 0506) SpO2:  [97 %-99 %] 97 % (09/10 0506) Last BM Date : 03/31/22  General: Elderly, not in distress Head:  Normocephalic  and atraumatic. Eyes:  Sclera clear, no icterus.   Prominent pallor Ears:  Normal auditory acuity. Nose:  No  deformity, discharge,  or lesions. Mouth:  No deformity or lesions.  Oropharynx pink & moist. Neck:  Supple; no masses or thyromegaly. Lungs:  Clear throughout to auscultation.   No wheezes, crackles, or rhonchi. No acute distress. Heart:  Regular rate and rhythm; no murmurs, clicks, rubs,  or gallops. Extremities:  Without clubbing or edema.  Bilateral groin rashes Neurologic:  Alert and  oriented x4;  grossly normal neurologically. Skin:  Intact without significant lesions or rashes. Psych:  Alert and cooperative. Normal mood and affect. Abdomen:  Soft, nontender and nondistended. No masses, hepatosplenomegaly or hernias noted. Normal bowel sounds, without guarding, and without rebound.         Lab Results: Recent Labs    04/01/22 1547 04/01/22 2357 04/02/22 0558 04/02/22 2133  WBC 11.5* 10.3  --   --   HGB 6.7* 7.5* 7.1* 8.7*  HCT 22.4* 25.3* 23.4* 29.0*  PLT 298 267  --   --    BMET Recent Labs    04/01/22 1547 04/01/22 2357 04/02/22 0554  NA 143 141 142  K 3.5 4.0 3.4*  CL 113* 113* 113*  CO2 '23 23 23  '$ GLUCOSE 98 115* 85  BUN 30* 31* 27*  CREATININE 1.39* 1.39* 1.32*  CALCIUM 8.4* 8.0* 7.9*   LFT Recent Labs    04/01/22 2357  PROT 6.0*  ALBUMIN 2.9*  AST 32  ALT 11  ALKPHOS 44  BILITOT 1.1   PT/INR No results for input(s): "LABPROT", "INR" in the last 72 hours.  Studies/Results: No results found.  Impression: Symptomatic anemia without obvious melena or hematochezia Hemoglobin 8 on 02/27/2022 and 6.7 on 04/01/2022 2 unit PRBC transfusion, hemoglobin 8.7 today Normocytic, normal platelet  BUN 27/creatinine 1.32/GFR 51, known chronic kidney disease He history of iron deficiency, iron saturation 5%, ferritin 11, TIBC 421  CT abdomen and pelvis with contrast/angiogram 02/03/22: No acute abdominopelvic pathology, status post aortobiiliac  endograft repair of infrarenal abdominal aortic aneurysm, aneurysmal sac 6 cm without evidence of leak  Plan: Had a lengthy talk with the daughter at bedside and the patient. Patient likely has chronic, small blood loss,?  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  Hemorrhoids without obvious melena and hematochezia but worsened with dual antiplatelet(Plavix and aspirin use).  Because of his underlying cardiac history and carotid stenosis, recommend continuing Plavix and aspirin if patient is without obvious GI blood loss(to be stopped or discontinued if there is obvious melena or hematochezia).  Since he has had a thorough GI work-up recently, no intention to repeat GI work-up this admission without obvious melena/hematochezia or GI blood loss.  Occult blood in stool could be related to hemorrhoids.  Will start patient on regular diet.  Will start patient on pantoprazole, to be continued 40 mg indefinitely as long as patient is on aspirin and Plavix.  He will benefit from close outpatient follow-up, CBC every 4 weeks(he has a visiting nurse at home or with his PCP) with plans to transfuse blood or give IV iron as needed.  Meanwhile, recommend increasing ferrous sulfate to 325 mg twice a day.  Will also start patient on Benefiber/Metamucil once a day as he complains of mild rectal discomfort.  Discussed the same with the patient, his daughter at bedside and Dr. Lupita Leash (patient's hospitalist).   LOS: 2 days   Ronnette Juniper, MD  04/03/2022, 9:27 AM

## 2022-04-03 NOTE — TOC Initial Note (Addendum)
Transition of Care [facility]) - Initial/Assessment Note    Patient Details  Name: Talan Gildner MRN: [id number] Date of Birth: 03-14-1929  [facility]) CM/SW Contact:    Dessa Phi, RN Phone Number: 04/03/2022, 3:36 PM  Clinical Narrative: Damaris Schooner to dtr Nunzio Cory about d/c plans-return home. PT recc HHPT;patient already has privat cargivers;dtr will confirm active [facility] agency for HHRN/PT/OT.Has own transportation.  -3:44p-[facility] is already active rep Jason-left vm.                 Expected Discharge Plan: [place] Barriers to Discharge: Continued Medical Work up   Patient Goals and CMS Choice Patient states their goals for this hospitalization and ongoing recovery are::  (Home w/HHC) CMS Medicare.gov Compare Post Acute Care list provided to:: Patient Represenative (must comment) (dtr Nunzio Cory) Choice offered to / list presented to : Adult Children  Expected Discharge Plan and Services Expected Discharge Plan: [place]   Discharge Planning Services: CM Consult Post Acute Care Choice: Home Health (rw,cane;has [place] agency but dtr unsure of agency-has [facility].) Living arrangements for the past 2 months: Single Family Home                                      Prior Living Arrangements/Services Living arrangements for the past 2 months: Single Family Home Lives with:: Spouse Patient language and need for interpreter reviewed:: Yes Do you feel safe going back to the place where you live?: Yes      Need for Family Participation in Patient Care: Yes (Comment) Care giver support system in place?: Yes (comment) Current home services: DME, Home RN, Home OT, Home PT (rw,cane;active w/HHC agency dtr unsure-will confirm in am;has private care givers) Criminal Activity/Legal Involvement Pertinent to Current Situation/Hospitalization: No - Comment as needed  Activities of Daily Living Home Assistive Devices/Equipment: Hearing aid, Environmental consultant  (specify type), Wheelchair, Shower chair with back (bilateral hearing aides) ADL Screening (condition at time of admission) Patient's cognitive ability adequate to safely complete daily activities?: Yes Is the patient deaf or have difficulty hearing?: Yes (wears 2 hearing aides) Does the patient have difficulty seeing, even when wearing glasses/contacts?: No Does the patient have difficulty concentrating, remembering, or making decisions?: No Patient able to express need for assistance with ADLs?: Yes Does the patient have difficulty dressing or bathing?: Yes Independently performs ADLs?: No Communication: Independent Dressing (OT): Needs assistance Is this a change from baseline?: Pre-admission baseline Grooming: Independent Feeding: Independent Bathing: Needs assistance Is this a change from baseline?: Pre-admission baseline Toileting: Needs assistance Is this a change from baseline?: Pre-admission baseline In/Out Bed: Needs assistance Is this a change from baseline?: Pre-admission baseline Walks in Home: Needs assistance Is this a change from baseline?: Pre-admission baseline Does the patient have difficulty walking or climbing stairs?: Yes Weakness of Legs: Both Weakness of Arms/Hands: Both  Permission Sought/Granted Permission sought to share information with : Case Manager Permission granted to share information with : Yes, Verbal Permission Granted  Share Information with NAME:  (Case manager)           Emotional Assessment Appearance:: Appears stated age Attitude/Demeanor/Rapport: Gracious Affect (typically observed): Accepting Orientation: : Oriented to Self, Oriented to Place, Oriented to  Time Alcohol / Substance Use: Not Applicable Psych Involvement: No (comment)  Admission diagnosis:  Acute cystitis with hematuria [N30.01] Symptomatic anemia [D64.9] Anemia, unspecified type [D64.9] Patient Active  Problem List   Diagnosis Date Noted   Benign neoplasm of  ascending colon    Fecal occult blood test positive    GI bleed 02/25/2022   Gross hematuria 02/25/2022   Malignant neoplasm of bladder, unspecified ([place]) 02/25/2022   Symptomatic anemia 02/25/2022   ABLA (acute blood loss anemia) 02/24/2022   Physical deconditioning 03/25/2021   Transient ischemic attack (TIA) 03/25/2021   Transient ischemic attack 03/24/2021   Stenosis of right carotid artery    Arthritis of left hip 09/01/2014   AAA (abdominal aortic aneurysm) [facility])    Essential hypertension    CAD (coronary artery disease)    S/P CABG x 4 06/27/2013   Hyperlipidemia    PCP:  [name], MD Pharmacy:   [place], [place] - [place] [address] [place] [address] Phone: [phone] Fax: [phone]     Social Determinants of Health (SDOH) Interventions    Readmission Risk Interventions     No data to display

## 2022-04-03 NOTE — Discharge Summary (Signed)
Physician Discharge Summary  Tyler [name] PIR:[id number] DOB: [birth year] DOA: 04/01/2022  PCP: [name], MD  Admit date: 04/01/2022 Discharge date: 04/05/2022 Recommendations for Outpatient Follow-up:  Follow up with PCP in 1 weeks-call for appointment Follow-up with urology as scheduled on Thursday Please obtain BMP/CBC in one week  Discharge Dispo: [facility] Discharge Condition: Stable Code Status:   Code Status: Full Code Diet recommendation:  Diet Order             Diet - low sodium heart healthy           Diet regular Room service appropriate? Yes; Fluid consistency: Thin  Diet effective now                 Brief/Interim Summary:88 year old male with complex medical history including bladder cancer with recent procedure, hyperlipidemia, CKD stage IIIa, CAD with CABG, hypertension/HLD/aortic atherosclerosis/ICA stenosis with prior history of CVA, BPH, failure to thrive presents to the ED with complaint of generalized weakness, polyuria and dysuria for a week.  He was supposed to see PCP today for urine culture but was too weak to go so brought to the ED.Recent admission 8/3 to 8/6 with symptomatic/acute blood loss anemia had negative EGD, colonoscopy and small bowel capsule study with questionable small AVM, Plavix was held, had 2 units transfusion done and was discharged home.  He was not able to void for last 3 days due to dysurea Patient was admitted given blood transfusion 2 units for symptomatic anemia with Hemoccult positive stool seen by GI.Suspect anemia of chronic disease contributing as well along with chronic,small blood loss ?related to duodenal AVM ?hemorrhoids-without melena hematochezia.After discussion with GI and patient/daughter put back on Plavix considering risk benefits. Patient had urine retention 9/11 needing Foley catheter which he will continue he has a urology appointment coming on 9/14 8 AM.  Urine culture grew more than 100,000 E faecalis and 10,000  Pseudomonas discussed with ID advised to continue ampicillin to treat a faecalis as Pseudomonas less likely the causative agent, at this time patient has significantly improved has been ambulating in the hallway.  No new complaints no fever no leukocytosis.  Discharge Diagnoses:  Principal Problem:   Symptomatic anemia Active Problems:   Malignant neoplasm of bladder, unspecified (HCC)   Hyperlipidemia   Essential hypertension   CAD (coronary artery disease)   S/P CABG x 4   Stenosis of right carotid artery  Symptomatic anemia FOBT+ stool without obvious melena or hematochezia: Recent admission for similar presentation with negative EGD colonoscopy and capsule study demonstrating questionable small AVM. S/P 2 units PRBC. H/H improved 8.6 gm.Hemoccult positive.  Discussed w/ YT:KZSW PPI. Suspect anemia of chronic disease contributing as well along with chronic, small blood loss ?related to duodenal AVM ?hemorrhoids-without melena hematochezia.H/H stable, cont to monitor. Cont plavix. Cont PPI.  Hemoglobin holding close to 8, again plan is to monitor hemoglobin closely as outpatient.  Continue PPI.  UTI: Urine culture shows 100,000 E Faecalis, 10,000 Pseudomonas.  Discussed with ID -continue ampicillin for 7 more days.     Malignant neoplasm of bladder with hematuria BPH with urine retention: Now needing Foley catheter for urine retention Continue patient's Flomax.he has appointment with urology in 2 days.   CAD status post CABG HTN HLD Aortic atherosclerosis ICA stenosis with prior history of CVA: No chest pain.  Stable.  Continue aspirin/Plavix along with PPI   CKD stage IIIa creatinine remains stable at 1.3.  Monitor.  Recent Labs  Lab 04/01/22 2357 04/02/22 0554  04/03/22 0926 04/04/22 0436 04/05/22 0515  BUN 31* 27* '19 20 21  '$ CREATININE 1.39* 1.32* 1.32* 1.37* 1.40*       Failure to thrive followed by home health PT RN transition care.  Augment nutritional  status  Consults: ID on phone  Subjective: Alert awake oriented afebrile no leukocytosis.  Discharge Exam: Vitals:   04/05/22 0449 04/05/22 1314  BP: 131/73 (!) 109/56  Pulse: 84 77  Resp: 18 (!) 22  Temp: 97.6 F (36.4 C) 98.4 F (36.9 C)  SpO2: 97% 98%   General: Pt is alert, awake, not in acute distress Cardiovascular: RRR, S1/S2 +, no rubs, no gallops Respiratory: CTA bilaterally, no wheezing, no rhonchi Abdominal: Soft, NT, ND, bowel sounds + Extremities: no edema, no cyanosis  Discharge Instructions  Discharge Instructions     Diet - low sodium heart healthy   Complete by: As directed    Discharge instructions   Complete by: As directed    Please follow-up with urology on Thursday to manage your urinary symptoms/Foley catheter. Follow-up with your primary care doctor to check.  Please call call MD or return to ER for similar or worsening recurring problem that brought you to hospital or if any fever,nausea/vomiting,abdominal pain, uncontrolled pain, chest pain,  shortness of breath or any other alarming symptoms.  Please follow-up your doctor as instructed in a week time and call the office for appointment.  Please avoid alcohol, smoking, or any other illicit substance and maintain healthy habits including taking your regular medications as prescribed.  You were cared for by a hospitalist during your hospital stay. If you have any questions about your discharge medications or the care you received while you were in the hospital after you are discharged, you can call the unit and ask to speak with the hospitalist on call if the hospitalist that took care of you is not available.  Once you are discharged, your primary care physician will handle any further medical issues.Please note that NO REFILLS for any discharge medications will be authorized once you are discharged, as it is imperative that you return to your primary care physician (or establish a relationship with  a primary care physician if you do not have one) for your aftercare needs so that they can reassess your need for medications and monitor your lab values.   Increase activity slowly   Complete by: As directed    No wound care   Complete by: As directed       Allergies as of 04/05/2022   No Known Allergies      Medication List     TAKE these medications    amoxicillin 500 MG capsule Commonly known as: AMOXIL Take 1 capsule (500 mg total) by mouth 3 (three) times daily for 7 days.   aspirin EC 81 MG tablet Take 1 tablet (81 mg total) by mouth daily. Swallow whole. Hold if  overt external bleeding   atorvastatin 40 MG tablet Commonly known as: LIPITOR Take 40 mg by mouth at bedtime.   clopidogrel 75 MG tablet Commonly known as: PLAVIX Take 1 tablet (75 mg total) by mouth daily. Hold plavix, resume when ok with urology What changed:  when to take this additional instructions   ferrous gluconate 324 MG tablet Commonly known as: FERGON Take 324 mg by mouth daily with breakfast.   furosemide 20 MG tablet Commonly known as: LASIX Take 1 tablet (20 mg total) by mouth daily as needed (swelling). What changed: reasons to take  this   metoprolol tartrate 25 MG tablet Commonly known as: LOPRESSOR Take 0.5 tablets (12.5 mg total) by mouth 2 (two) times daily.   nitroGLYCERIN 0.4 MG SL tablet Commonly known as: NITROSTAT Place 1 tablet (0.4 mg total) under the tongue every 5 (five) minutes as needed for chest pain.   pantoprazole 40 MG tablet Commonly known as: Protonix Take 1 tablet (40 mg total) by mouth daily.   terazosin 5 MG capsule Commonly known as: HYTRIN Take 5 mg by mouth at bedtime.        Follow-up Information     [name], MD Follow up in 1 week(s).   Specialty: Internal Medicine Contact information: [address] [place] [phone]         Belva Crome, MD .   Specialty: Cardiology Contact information: [phone] [place] [address] [phone]         Christeen Douglas, MD Follow up in 3 day(s).   Contact information: [address]   [place], [place] [address]   [phone] (Work)   [phone] (Fax)               No Known Allergies  The results of significant diagnostics from this hospitalization (including imaging, microbiology, ancillary and laboratory) are listed below for reference.    Microbiology: Recent Results (from the past 240 hour(s))  Urine Culture     Status: Abnormal   Collection Time: 04/02/22  2:15 PM   Specimen: Urine, Clean Catch  Result Value Ref Range Status   Specimen Description URINE, CLEAN CATCH  Final   Special Requests NONE  Final   Culture (A)  Final    >=100,000 COLONIES/mL ENTEROCOCCUS FAECALIS 10,000 COLONIES/mL PSEUDOMONAS AERUGINOSA    Report Status 04/05/2022 FINAL  Final   Organism ID, Bacteria ENTEROCOCCUS FAECALIS (A)  Final   Organism ID, Bacteria PSEUDOMONAS AERUGINOSA (A)  Final      Susceptibility   Enterococcus faecalis - MIC*    AMPICILLIN <=2 SENSITIVE Sensitive     LEVOFLOXACIN >=8 RESISTANT Resistant     NITROFURANTOIN <=16 SENSITIVE Sensitive     VANCOMYCIN 1 SENSITIVE Sensitive     * >=100,000 COLONIES/mL ENTEROCOCCUS FAECALIS   Pseudomonas aeruginosa - MIC*    CEFTAZIDIME <=1 SENSITIVE Sensitive     CIPROFLOXACIN 1 INTERMEDIATE Intermediate     GENTAMICIN <=1 SENSITIVE Sensitive     IMIPENEM 1 SENSITIVE Sensitive     PIP/TAZO <=4 SENSITIVE Sensitive     CEFEPIME 4 SENSITIVE Sensitive     LEVOFLOXACIN Value in next row Resistant      RESISTANT= 8 RPerformed at [facility], [address], [place], [place] [address]    * 10,000 COLONIES/mL PSEUDOMONAS AERUGINOSA    Procedures/Studies: No results found.  Labs: BNP (last 3 results) No results for input(s): "BNP" in the last 8760 hours. Basic Metabolic Panel: Recent Labs  Lab 04/01/22 2357 04/02/22 0554 04/03/22 0926 04/04/22 0436  04/05/22 0515  NA 141 142 143 141 141  K 4.0 3.4* 3.9 4.7 3.8  CL 113* 113* 116* 114* 113*  CO2 '23 23 23 '$ 21* 23  GLUCOSE 115* 85 91 133* 104*  BUN 31* 27* '19 20 21  '$ CREATININE 1.39* 1.32* 1.32* 1.37* 1.40*  CALCIUM 8.0* 7.9* 8.2* 7.8* 8.0*   Liver Function Tests: Recent Labs  Lab 04/01/22 1547 04/01/22 2357  AST 16 32  ALT 12 11  ALKPHOS 48 44  BILITOT 0.6 1.1  PROT 6.2* 6.0*  ALBUMIN 2.9* 2.9*   No results for input(s): "LIPASE", "AMYLASE" in the last 168 hours. No results for input(s): "AMMONIA" in the last 168 hours. CBC: Recent Labs  Lab 04/01/22 1547 04/01/22 2357 04/02/22 0558 04/02/22 2133 04/03/22 0926 04/04/22 0436 04/05/22 0515  WBC 11.5* 10.3  --   --  8.4 10.5 9.8  NEUTROABS 8.1*  --   --   --   --   --   --   HGB 6.7* 7.5* 7.1* 8.7* 8.6* 8.2* 7.9*  HCT 22.4* 25.3* 23.4* 29.0* 28.2* 27.6* 27.1*  MCV 85.8 89.7  --   --  86.0 88.5 89.7  PLT 298 267  --   --  244 225 200   Cardiac Enzymes: No results for input(s): "CKTOTAL", "CKMB", "CKMBINDEX", "TROPONINI" in the last 168 hours. BNP: Invalid input(s): "POCBNP" CBG: No results for input(s): "GLUCAP" in the last 168 hours. D-Dimer No results for input(s): "DDIMER" in the last 72 hours. Hgb A1c No results for input(s): "HGBA1C" in the last 72 hours. Lipid Profile No results for input(s): "CHOL", "HDL", "LDLCALC", "TRIG", "CHOLHDL", "LDLDIRECT" in the last 72 hours. Thyroid function studies No results for input(s): "TSH", "T4TOTAL", "T3FREE", "THYROIDAB" in the last 72 hours.  Invalid input(s): "FREET3" Anemia work up No results for input(s): "VITAMINB12", "FOLATE", "FERRITIN", "TIBC", "IRON", "RETICCTPCT" in the last 72 hours. Urinalysis    Component Value Date/Time   COLORURINE YELLOW 04/04/2022 1014   APPEARANCEUR TURBID (A) 04/04/2022 1014   LABSPEC 1.011 04/04/2022 1014   PHURINE 5.0 04/04/2022 1014   GLUCOSEU NEGATIVE 04/04/2022 1014   HGBUR MODERATE (A) 04/04/2022 1014   BILIRUBINUR  NEGATIVE 04/04/2022 1014   KETONESUR NEGATIVE 04/04/2022 1014   PROTEINUR 100 (A) 04/04/2022 1014   UROBILINOGEN 0.2 08/22/2014 1559   NITRITE NEGATIVE 04/04/2022 1014   LEUKOCYTESUR MODERATE (A) 04/04/2022 1014   Sepsis Labs Recent Labs  Lab 04/01/22 2357 04/03/22 0926 04/04/22 0436 04/05/22 0515  WBC 10.3 8.4 10.5 9.8   Microbiology Recent Results (from the past 240 hour(s))  Urine Culture     Status: Abnormal   Collection Time: 04/02/22  2:15 PM   Specimen: Urine, Clean Catch  Result Value Ref Range Status   Specimen Description URINE, CLEAN CATCH  Final   Special Requests NONE  Final   Culture (A)  Final    >=100,000 COLONIES/mL ENTEROCOCCUS FAECALIS 10,000 COLONIES/mL PSEUDOMONAS AERUGINOSA    Report Status 04/05/2022 FINAL  Final   Organism ID, Bacteria ENTEROCOCCUS FAECALIS (A)  Final   Organism ID, Bacteria PSEUDOMONAS AERUGINOSA (A)  Final      Susceptibility   Enterococcus faecalis - MIC*    AMPICILLIN <=2 SENSITIVE Sensitive     LEVOFLOXACIN >=8 RESISTANT Resistant     NITROFURANTOIN <=16 SENSITIVE Sensitive     VANCOMYCIN 1 SENSITIVE Sensitive     * >=100,000 COLONIES/mL ENTEROCOCCUS FAECALIS   Pseudomonas aeruginosa - MIC*    CEFTAZIDIME <=1 SENSITIVE Sensitive     CIPROFLOXACIN 1 INTERMEDIATE Intermediate     GENTAMICIN <=1 SENSITIVE Sensitive     IMIPENEM 1 SENSITIVE Sensitive     PIP/TAZO <=4 SENSITIVE Sensitive     CEFEPIME 4 SENSITIVE Sensitive     LEVOFLOXACIN Value in next row Resistant      RESISTANT= 8 RPerformed at [facility] [address], [place], [place] [address]    * 10,000 COLONIES/mL PSEUDOMONAS AERUGINOSA  Time coordinating discharge: 25 minutes  SIGNED: Antonieta Pert, MD  [facility] 04/05/2022, 2:06 PM  If 7PM-7AM, please contact night-coverage [email or web address]

## 2022-04-03 NOTE — Progress Notes (Signed)
PROGRESS NOTE Tyler [name]  ZOX:[id number] DOB: 1929-06-12 DOA: 04/01/2022 PCP: [name], MD   Brief Narrative/Hospital Course: 88 year old male with complex medical history including bladder cancer with recent procedure, hyperlipidemia, CKD stage IIIa, CAD with CABG, hypertension/HLD/aortic atherosclerosis/ICA stenosis with prior history of CVA, BPH, failure to thrive presents to the ED with complaint of generalized weakness, polyuria and dysuria for a week.  He was supposed to see PCP today for urine culture but was too weak to go so brought to the ED.Recent admission 8/3 to 8/6 with symptomatic/acute blood loss anemia had negative EGD, colonoscopy and small bowel capsule study with questionable small AVM, Plavix was held, had 2 units transfusion done and was discharged home.  He was not able to void for last 3 days due to dysurea.He denies any black stool or hematuria. No nausea or vomiting. He has been back on plavix and aspirin since urologic procedure on 03/07/22 Patient otherwise denies any nausea, vomiting, chest pain, shortness of breath, fever, chills, headache, focal weakness, numbness tingling, speech difficulties.  S/p 2 units h/h in mid 8 gm.    Subjective: Seen and examined.  No new complaints.  Is resting comfortably.   Daughter at the bedside   Assessment and Plan: Principal Problem:   Symptomatic anemia Active Problems:   Malignant neoplasm of bladder, unspecified (HCC)   Hyperlipidemia   Essential hypertension   CAD (coronary artery disease)   S/P CABG x 4   Stenosis of right carotid artery   Symptomatic anemia FOBT+ stool without obvious melena or hematochezia: Recent admission for similar presentation with negative EGD colonoscopy and capsule study demonstrating questionable small AVM.  s/p 2 units PRBC. Hh improved 8.6 gm.Hemoccult positive.  Discussed w/ WJ:XBJY PPI. Suspect anemia of chronic disease contributing as well along with chronic, small blood loss  ?related to duodenal AVM ?hemorrhoids-without melena hematochezia.  Discussed risk benefits alternatives given patient's cardiac history of carotid stenosis noted prefers to back on DAPT-resuming Plavix-monitor CBC tomorrow.  Will need close monitoring of CBC and transfusing as needed as outpatient Recent Labs  Lab 04/01/22 1547 04/01/22 2357 04/02/22 0558 04/02/22 2133 04/03/22 0926  HGB 6.7* 7.5* 7.1* 8.7* 8.6*  HCT 22.4* 25.3* 23.4* 29.0* 28.2*     UTI: Continue ceftriaxone.  Urine culture pending   Malignant neoplasm of bladder with hematuria BPH: Continue patient's Flomax.He is supposed to start chemo in a month from urology.   CAD status post CABG HTN HLD Aortic atherosclerosis ICA stenosis with prior history of CVA: No chest pain.  Stable.  Continue aspirin-holding Plavix for now- w/ anemia ,fobt+ stool.  CKD stage IIIa creatinine slightly uptrending? His baseline, cont gentle ivf Recent Labs  Lab 04/01/22 1547 04/01/22 2357 04/02/22 0554  BUN 30* 31* 27*  CREATININE 1.39* 1.39* 1.32*     Failure to thrive followed by home health PT RN transition care.  Augment nutritional status  DVT prophylaxis: SCDs Start: 04/01/22 1737 Code Status:   Code Status: Full Code Family Communication: plan of care discussed with patient/daughter at bedside. Patient status is: Inpatient because of ongoing management of anemia UTI Level of care: Telemetry  Dispo: The patient is from: Home.  Will request PT OT evaluation            Anticipated disposition: Anticipate discharge in next 24 hrs  Mobility Assessment (last 72 hours)     Mobility Assessment     Row Name 04/02/22 2054 04/02/22 0834 04/01/22 2027       Does  patient have an order for bedrest or is patient medically unstable No - Continue assessment No - Continue assessment No - Continue assessment     What is the highest level of mobility based on the progressive mobility assessment? Level 4 (Walks with assist in room) -  Balance while marching in place and cannot step forward and back - Complete Level 3 (Stands with assist) - Balance while standing  and cannot march in place Level 4 (Walks with assist in room) - Balance while marching in place and cannot step forward and back - Complete     Is the above level different from baseline mobility prior to current illness? -- Yes - Recommend PT order --             Objective: Vitals last 24 hrs: Vitals:   04/02/22 1349 04/02/22 2055 04/02/22 2107 04/03/22 0506  BP: 133/67 (!) 142/70 (!) 142/70 133/71  Pulse: 65  69 83  Resp: '14  18 17  '$ Temp: 98 F (36.7 C)  98.4 F (36.9 C) (!) 97.5 F (36.4 C)  TempSrc: Oral  Oral Oral  SpO2: 99%  99% 97%   Weight change:   Physical Examination: General exam: AAOX3, older than stated age, weak appearing. HEENT:Oral mucosa moist, Ear/Nose WNL grossly, dentition normal. Respiratory system: bilaterally clear,no use of accessory muscle Cardiovascular system: S1 & S2 +, No JVD,. Gastrointestinal system: Abdomen soft,NT,ND,BS+ Nervous System:Alert, awake, moving extremities and grossly nonfocal Extremities: LE ankle edema NEG,distal peripheral pulses palpable.  Skin: No rashes,no icterus. MSK: Normal muscle bulk,tone, power   Medications reviewed:  Scheduled Meds:  aspirin EC  81 mg Oral Daily   atorvastatin  40 mg Oral q1800   clopidogrel  75 mg Oral Daily   metoprolol tartrate  12.5 mg Oral BID   pantoprazole (PROTONIX) IV  40 mg Intravenous Q24H   terazosin  5 mg Oral QHS   Continuous Infusions:  sodium chloride 30 mL/hr at 04/03/22 0253   cefTRIAXone (ROCEPHIN)  IV 1 g (04/02/22 1709)    Diet Order             Diet regular Room service appropriate? Yes; Fluid consistency: Thin  Diet effective now                  Intake/Output Summary (Last 24 hours) at 04/03/2022 1006 Last data filed at 04/03/2022 0507 Gross per 24 hour  Intake 1378.16 ml  Output 1951 ml  Net -572.84 ml   Unresulted Labs  (From admission, onward)     Start     Ordered   04/04/22 6314  Basic metabolic panel  Daily at 5am,   R      04/03/22 0743   04/04/22 0500  CBC  Daily at 5am,   R      04/03/22 0743   04/03/22 9702  Basic metabolic panel  ONCE - URGENT,   URGENT        04/03/22 0743   04/02/22 0900  Urine Culture  (Urine Culture)  ONCE - URGENT,   URGENT       Question:  Indication  Answer:  Dysuria   04/02/22 0859   04/01/22 1737  Occult blood card to lab, stool  Once,   R        04/01/22 1736          Data Reviewed: I have personally reviewed following labs and imaging studies CBC: Recent Labs  Lab 04/01/22 1547 04/01/22 2357 04/02/22 0558 04/02/22  2133 04/03/22 0926  WBC 11.5* 10.3  --   --  8.4  NEUTROABS 8.1*  --   --   --   --   HGB 6.7* 7.5* 7.1* 8.7* 8.6*  HCT 22.4* 25.3* 23.4* 29.0* 28.2*  MCV 85.8 89.7  --   --  86.0  PLT 298 267  --   --  327   Basic Metabolic Panel: Recent Labs  Lab 04/01/22 1547 04/01/22 2357 04/02/22 0554  NA 143 141 142  K 3.5 4.0 3.4*  CL 113* 113* 113*  CO2 '23 23 23  '$ GLUCOSE 98 115* 85  BUN 30* 31* 27*  CREATININE 1.39* 1.39* 1.32*  CALCIUM 8.4* 8.0* 7.9*   GFR: CrCl cannot be calculated (Unknown ideal weight.). Liver Function Tests: Recent Labs  Lab 04/01/22 1547 04/01/22 2357  AST 16 32  ALT 12 11  ALKPHOS 48 44  BILITOT 0.6 1.1  PROT 6.2* 6.0*  ALBUMIN 2.9* 2.9*   No results found for this or any previous visit (from the past 240 hour(s)).  Antimicrobials: Anti-infectives (From admission, onward)    Start     Dose/Rate Route Frequency Ordered Stop   04/01/22 1800  cefTRIAXone (ROCEPHIN) 1 g in sodium chloride 0.9 % 100 mL IVPB        1 g 200 mL/hr over 30 Minutes Intravenous Every 24 hours 04/01/22 1736 04/06/22 1759   04/01/22 1630  cefTRIAXone (ROCEPHIN) 1 g in sodium chloride 0.9 % 100 mL IVPB  Status:  Discontinued        1 g 200 mL/hr over 30 Minutes Intravenous  Once 04/01/22 1628 04/01/22 1737       Culture/Microbiology    Component Value Date/Time   SDES  02/25/2022 1547    URINE, CLEAN CATCH Performed at [facility], [address] [place], [place] [address]    SPECREQUEST  02/25/2022 1547    NONE Performed at [facility], [place] [address] [place], [place] [address]    CULT (A) 02/25/2022 1547    <10,000 COLONIES/mL INSIGNIFICANT GROWTH Performed at [facility] [address] [place], [place] [address]    REPTSTATUS 02/27/2022 FINAL 02/25/2022 1547    Radiology Studies: No results found.   LOS: 2 days   Antonieta Pert, MD [facility]  04/03/2022, 10:06 AM

## 2022-04-04 LAB — URINALYSIS, ROUTINE W REFLEX MICROSCOPIC
Bilirubin Urine: NEGATIVE
Glucose, UA: NEGATIVE mg/dL
Ketones, ur: NEGATIVE mg/dL
Nitrite: NEGATIVE
Protein, ur: 100 mg/dL — AB
Specific Gravity, Urine: 1.011 (ref 1.005–1.030)
WBC, UA: >50 WBC/hpf — ABNORMAL HIGH (ref 0–5)
pH: 5 (ref 5.0–8.0)

## 2022-04-04 LAB — CBC
HCT: 27.6 % — ABNORMAL LOW (ref 39.0–52.0)
Hemoglobin: 8.2 g/dL — ABNORMAL LOW (ref 13.0–17.0)
MCH: 26.3 pg (ref 26.0–34.0)
MCHC: 29.7 g/dL — ABNORMAL LOW (ref 30.0–36.0)
MCV: 88.5 fL (ref 80.0–100.0)
Platelets: 225 10*3/uL (ref 150–400)
RBC: 3.12 MIL/uL — ABNORMAL LOW (ref 4.22–5.81)
RDW: 19.2 % — ABNORMAL HIGH (ref 11.5–15.5)
WBC: 10.5 10*3/uL (ref 4.0–10.5)
nRBC: 0 % (ref 0.0–0.2)

## 2022-04-04 LAB — BPAM RBC
Blood Product Expiration Date: 202310042359
Blood Product Expiration Date: 202310072359
ISSUE DATE / TIME: 202309081755
ISSUE DATE / TIME: 202309091124
Unit Type and Rh: 600
Unit Type and Rh: 600

## 2022-04-04 LAB — TYPE AND SCREEN
ABO/RH(D): A NEG
Antibody Screen: NEGATIVE
Unit division: 0
Unit division: 0

## 2022-04-04 LAB — BASIC METABOLIC PANEL
Anion gap: 6 (ref 5–15)
BUN: 20 mg/dL (ref 8–23)
CO2: 21 mmol/L — ABNORMAL LOW (ref 22–32)
Calcium: 7.8 mg/dL — ABNORMAL LOW (ref 8.9–10.3)
Chloride: 114 mmol/L — ABNORMAL HIGH (ref 98–111)
Creatinine, Ser: 1.37 mg/dL — ABNORMAL HIGH (ref 0.61–1.24)
GFR, Estimated: 49 mL/min — ABNORMAL LOW (ref >60)
Glucose, Bld: 133 mg/dL — ABNORMAL HIGH (ref 70–99)
Potassium: 4.7 mmol/L (ref 3.5–5.1)
Sodium: 141 mmol/L (ref 135–145)

## 2022-04-04 MED ORDER — FINASTERIDE 5 MG PO TABS
5.0000 mg | ORAL_TABLET | Freq: Every day | ORAL | Status: DC
  Administered 2022-04-04 – 2022-04-05 (×2): 5 mg via ORAL
  Filled 2022-04-04 (×2): qty 1

## 2022-04-04 MED ORDER — PIPERACILLIN-TAZOBACTAM 3.375 G IVPB
3.3750 g | Freq: Three times a day (TID) | INTRAVENOUS | Status: DC
  Administered 2022-04-04 – 2022-04-05 (×3): 3.375 g via INTRAVENOUS
  Filled 2022-04-04 (×3): qty 50

## 2022-04-04 MED ORDER — AMOXICILLIN 250 MG PO CAPS: 500.0000 mg | ORAL_CAPSULE | Freq: Three times a day (TID) | ORAL | Status: DC

## 2022-04-04 NOTE — Progress Notes (Signed)
[facility] Gastroenterology Progress Note  Tyler [name] 88 y.o. 03-15-1929   Subjective: Patient seen and examined lying in bed.  Patient is eating normal diet without issues.  No bowel movement since Saturday.  Denies melena, hematochezia.  Denies abdominal pain.  Overall he is feeling well today.  ROS : Review of Systems  Gastrointestinal:  Negative for abdominal pain, blood in stool, constipation, diarrhea, heartburn, melena, nausea and vomiting.  Genitourinary:  Negative for dysuria and urgency.    Objective: Vital signs in last 24 hours: Vitals:   04/03/22 2008 04/04/22 0530  BP: 132/64 114/61  Pulse: 70 72  Resp: 20 18  Temp: 99.5 F (37.5 C) 97.8 F (36.6 C)  SpO2: 97% 96%    Physical Exam:  General:  Alert, cooperative, no distress, appears stated age, notable pallor  Head:  Normocephalic, without obvious abnormality, atraumatic  Eyes:  Anicteric sclera, EOM's intact, conjunctival pallor  Lungs:   Clear to auscultation bilaterally, respirations unlabored  Heart:  Regular rate and rhythm, S1, S2 normal  Abdomen:   Soft, non-tender, bowel sounds active all four quadrants,  no masses,   Extremities: Extremities normal, atraumatic, no  edema  Pulses: 2+ and symmetric    Lab Results: Recent Labs    04/03/22 0926 04/04/22 0436  NA 143 141  K 3.9 4.7  CL 116* 114*  CO2 23 21*  GLUCOSE 91 133*  BUN 19 20  CREATININE 1.32* 1.37*  CALCIUM 8.2* 7.8*   Recent Labs    04/01/22 1547 04/01/22 2357  AST 16 32  ALT 12 11  ALKPHOS 48 44  BILITOT 0.6 1.1  PROT 6.2* 6.0*  ALBUMIN 2.9* 2.9*   Recent Labs    04/01/22 1547 04/01/22 2357 04/03/22 0926 04/04/22 0436  WBC 11.5*   < > 8.4 10.5  NEUTROABS 8.1*  --   --   --   HGB 6.7*   < > 8.6* 8.2*  HCT 22.4*   < > 28.2* 27.6*  MCV 85.8   < > 86.0 88.5  PLT 298   < > 244 225   < > = values in this interval not displayed.   No results for input(s): "LABPROT", "INR" in the last 72  hours.    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   No episodes of melena or hematochezia during hospitalization.  Patiently had extensive GI work-up including EGD, colonoscopy and capsule endoscopy.  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    Patient tolerating normal diet well.  Previously recommended continuing anticoagulation with Plavix and aspirin if patient is without obvious GI blood loss.    Plan: Continue to monitor hemoglobin closely with PCP every 4 weeks. Continue with ferrous sulfate 325 mg twice daily. Continue Benefiber or Metamucil once daily Continue pantoprazole 40 mg daily while on anticoagulation. [facility] will sign off.  Arvella Nigh Peace Jost PA-C 04/04/2022, 9:03 AM  Contact #  [phone]

## 2022-04-04 NOTE — Progress Notes (Signed)
Pharmacy Antibiotic Note  Tyler [name] is a 88 y.o. male admitted on 04/01/2022 with symptomatic anemia & UTI.  Pharmacy has been consulted for zosyn dosing.  Plan: Zosyn 3.375g IV q8h (4 hour infusion).     Temp (24hrs), Avg:98.6 F (37 C), Min:97.8 F (36.6 C), Max:99.5 F (37.5 C)  Recent Labs  Lab 04/01/22 1547 04/01/22 2357 04/02/22 0554 04/03/22 0926 04/04/22 0436  WBC 11.5* 10.3  --  8.4 10.5  CREATININE 1.39* 1.39* 1.32* 1.32* 1.37*    CrCl cannot be calculated (Unknown ideal weight.).    No Known Allergies  Antimicrobials this admission: 9/8 CTX>> 9/11 9/11 zosyn>>  Dose adjustments this admission:  Microbiology results: 9/9 UCx: > 100 K enterococcus faecalis - pan sensitive; 10 K Pseudomonas aeruginosa -   Thank you for allowing pharmacy to be a part of this patient's care.  Eudelia Bunch, Pharm.D 04/04/2022 3:57 PM

## 2022-04-04 NOTE — Progress Notes (Signed)
PROGRESS NOTE Tyler [name]  TSV:[id number] DOB: [birth year] DOA: 04/01/2022 PCP: [name], MD   Brief Narrative/Hospital Course: 88 year old male with complex medical history including bladder cancer with recent procedure, hyperlipidemia, CKD stage IIIa, CAD with CABG, hypertension/HLD/aortic atherosclerosis/ICA stenosis with prior history of CVA, BPH, failure to thrive presents to the ED with complaint of generalized weakness, polyuria and dysuria for a week.  He was supposed to see PCP today for urine culture but was too weak to go so brought to the ED.Recent admission 8/3 to 8/6 with symptomatic/acute blood loss anemia had negative EGD, colonoscopy and small bowel capsule study with questionable small AVM, Plavix was held, had 2 units transfusion done and was discharged home.  He was not able to void for last 3 days due to dysurea Patient was admitted given blood transfusion 2 units for symptomatic anemia with Hemoccult positive stool seen by GI.Suspect anemia of chronic disease contributing as well along with chronic, small blood loss ?related to duodenal AVM ?hemorrhoids-without melena hematochezia.  After discussion with GI and patient/daughter put back on Plavix considering risk benefits-monitored overnight. S/p 2 units h/h in mid 8 gm.    Subjective: Seen.examined Resting well No new complaints Not peeing well, somewhat milky Daughter at the bedside  Having some voiding difficulties when not with pure wick  Assessment and Plan: Principal Problem:   Symptomatic anemia Active Problems:   Malignant neoplasm of bladder, unspecified (HCC)   Hyperlipidemia   Essential hypertension   CAD (coronary artery disease)   S/P CABG x 4   Stenosis of right carotid artery   Symptomatic anemia FOBT+ stool without obvious melena or hematochezia: Recent admission for similar presentation with negative EGD colonoscopy and capsule study demonstrating questionable small AVM. S/P 2 units PRBC.  H/H improved 8.6 gm.Hemoccult positive.  Discussed w/ PQ:ZRAQ PPI. Suspect anemia of chronic disease contributing as well along with chronic, small blood loss ?related to duodenal AVM ?hemorrhoids-without melena hematochezia.H/H stable, cont to monitor. Cont plavix. Cont PPI. Recent Labs  Lab 04/01/22 2357 04/02/22 0558 04/02/22 2133 04/03/22 0926 04/04/22 0436  HGB 7.5* 7.1* 8.7* 8.6* 8.2*  HCT 25.3* 23.4* 29.0* 28.2* 27.6*   UTI: Urine culture shows 100,000 E Faecalis, 10,000 Pseudomonas.  Discussed with ID will cover with Zosyn overnight.  Repeat UA.  Discontinue ceftriaxone   Malignant neoplasm of bladder with hematuria BPH: Continue patient's Flomax.He is supposed to start chemo in a month from urology.   CAD status post CABG HTN HLD Aortic atherosclerosis ICA stenosis with prior history of CVA: No chest pain.  Stable.  Continue aspirin/Plavix along with PPI  CKD stage IIIa creatinine remains stable at 1.3.  Monitor.  Discontinue IV fluids  Recent Labs  Lab 04/01/22 1547 04/01/22 2357 04/02/22 0554 04/03/22 0926 04/04/22 0436  BUN 30* 31* 27* 19 20  CREATININE 1.39* 1.39* 1.32* 1.32* 1.37*      Failure to thrive followed by home health PT RN transition care.  Augment nutritional status  DVT prophylaxis: SCDs Start: 04/01/22 1737 Code Status:   Code Status: Full Code Family Communication: plan of care discussed with patient/daughter at bedside. Patient status is: Inpatient because of ongoing management of anemia UTI Level of care: Telemetry  Dispo: The patient is from: Home.              Anticipated disposition: Anticipate discharge in next 24 hrs with home health  Mobility Assessment (last 72 hours)     Mobility Assessment     Row Name 04/04/22  1223 04/03/22 2100 04/03/22 1600 04/03/22 1047 04/02/22 2054   Does patient have an order for bedrest or is patient medically unstable -- No - Continue assessment -- No - Continue assessment No - Continue assessment    What is the highest level of mobility based on the progressive mobility assessment? Level 5 (Walks with assist in room/hall) - Balance while stepping forward/back and can walk in room with assist - Complete Level 4 (Walks with assist in room) - Balance while marching in place and cannot step forward and back - Complete Level 5 (Walks with assist in room/hall) - Balance while stepping forward/back and can walk in room with assist - Complete Level 4 (Walks with assist in room) - Balance while marching in place and cannot step forward and back - Complete Level 4 (Walks with assist in room) - Balance while marching in place and cannot step forward and back - Complete   Is the above level different from baseline mobility prior to current illness? -- Yes - Recommend PT order -- -- --    Row Name 04/02/22 0834 04/01/22 2027         Does patient have an order for bedrest or is patient medically unstable No - Continue assessment No - Continue assessment      What is the highest level of mobility based on the progressive mobility assessment? Level 3 (Stands with assist) - Balance while standing  and cannot march in place Level 4 (Walks with assist in room) - Balance while marching in place and cannot step forward and back - Complete      Is the above level different from baseline mobility prior to current illness? Yes - Recommend PT order --              Objective: Vitals last 24 hrs: Vitals:   04/03/22 2008 04/04/22 0446 04/04/22 0530 04/04/22 1442  BP: 132/64 114/61 114/61 (!) 97/57  Pulse: 70 72 72 79  Resp: '20 18 18 20  '$ Temp: 99.5 F (37.5 C) 97.8 F (36.6 C) 97.8 F (36.6 C) 98.2 F (36.8 C)  TempSrc: Oral Oral Oral   SpO2: 97% 96% 96% 97%   Weight change:   Physical Examination: General exam: AAO, hard of hearing older than stated age, weak appearing. HEENT:Oral mucosa moist, Ear/Nose WNL grossly, dentition normal. Respiratory system: bilaterally clear, no use of accessory  muscle Cardiovascular system: S1 & S2 +, No JVD,. Gastrointestinal system: Abdomen soft,NT,ND,BS+ Nervous System:Alert, awake, moving extremities and grossly nonfocal Extremities: LE ankle edema NEG, distal peripheral pulses palpable.  Skin: No rashes,no icterus. MSK: Normal muscle bulk,tone, power   Medications reviewed:  Scheduled Meds:  amoxicillin  500 mg Oral Q8H   aspirin EC  81 mg Oral Daily   atorvastatin  40 mg Oral q1800   clopidogrel  75 mg Oral Daily   ferrous gluconate  324 mg Oral BID WC   metoprolol tartrate  12.5 mg Oral BID   pantoprazole (PROTONIX) IV  40 mg Intravenous Q24H   terazosin  5 mg Oral QHS   Continuous Infusions:  sodium chloride 30 mL/hr at 04/04/22 1347    Diet Order             Diet regular Room service appropriate? Yes; Fluid consistency: Thin  Diet effective now                  Intake/Output Summary (Last 24 hours) at 04/04/2022 1543 Last data filed at 04/04/2022 0156 Gross  per 24 hour  Intake 165.05 ml  Output 650 ml  Net -484.95 ml    Unresulted Labs (From admission, onward)     Start     Ordered   04/04/22 1014  Urinalysis, Routine w reflex microscopic  ONCE - URGENT,   URGENT        04/04/22 1013   04/04/22 4782  Basic metabolic panel  Daily at 5am,   R      04/03/22 0743   04/04/22 0500  CBC  Daily at 5am,   R      04/03/22 0743   04/01/22 1737  Occult blood card to lab, stool  Once,   R        04/01/22 1736          Data Reviewed: I have personally reviewed following labs and imaging studies CBC: Recent Labs  Lab 04/01/22 1547 04/01/22 2357 04/02/22 0558 04/02/22 2133 04/03/22 0926 04/04/22 0436  WBC 11.5* 10.3  --   --  8.4 10.5  NEUTROABS 8.1*  --   --   --   --   --   HGB 6.7* 7.5* 7.1* 8.7* 8.6* 8.2*  HCT 22.4* 25.3* 23.4* 29.0* 28.2* 27.6*  MCV 85.8 89.7  --   --  86.0 88.5  PLT 298 267  --   --  244 956    Basic Metabolic Panel: Recent Labs  Lab 04/01/22 1547 04/01/22 2357 04/02/22 0554  04/03/22 0926 04/04/22 0436  NA 143 141 142 143 141  K 3.5 4.0 3.4* 3.9 4.7  CL 113* 113* 113* 116* 114*  CO2 '23 23 23 23 '$ 21*  GLUCOSE 98 115* 85 91 133*  BUN 30* 31* 27* 19 20  CREATININE 1.39* 1.39* 1.32* 1.32* 1.37*  CALCIUM 8.4* 8.0* 7.9* 8.2* 7.8*    GFR: CrCl cannot be calculated (Unknown ideal weight.). Liver Function Tests: Recent Labs  Lab 04/01/22 1547 04/01/22 2357  AST 16 32  ALT 12 11  ALKPHOS 48 44  BILITOT 0.6 1.1  PROT 6.2* 6.0*  ALBUMIN 2.9* 2.9*    Recent Results (from the past 240 hour(s))  Urine Culture     Status: Abnormal (Preliminary result)   Collection Time: 04/02/22  2:15 PM   Specimen: Urine, Clean Catch  Result Value Ref Range Status   Specimen Description   Final    URINE, CLEAN CATCH Performed at [facility], [place] [address] [place], [place] [address]    Special Requests   Final    NONE Performed at [facility], [place] [address], [place], [place] [address]    Culture (A)  Final    >=100,000 COLONIES/mL ENTEROCOCCUS FAECALIS 10,000 COLONIES/mL PSEUDOMONAS AERUGINOSA SUSCEPTIBILITIES TO FOLLOW Performed at [facility], [place] [address], [place], [place] [address]    Report Status PENDING  Incomplete   Organism ID, Bacteria ENTEROCOCCUS FAECALIS (A)  Final      Susceptibility   Enterococcus faecalis - MIC*    AMPICILLIN <=2 SENSITIVE Sensitive     NITROFURANTOIN <=16 SENSITIVE Sensitive     VANCOMYCIN 1 SENSITIVE Sensitive     * >=100,000 COLONIES/mL ENTEROCOCCUS FAECALIS    Antimicrobials: Anti-infectives (From admission, onward)    Start     Dose/Rate Route Frequency Ordered Stop   04/04/22 1500  amoxicillin (AMOXIL) capsule 500 mg        500 mg Oral Every 8 hours 04/04/22 1401     04/01/22 1800  cefTRIAXone (ROCEPHIN) 1  g in sodium chloride 0.9 % 100 mL IVPB  Status:  Discontinued        1 g 200 mL/hr over 30 Minutes Intravenous Every 24 hours 04/01/22 1736 04/04/22 1401    04/01/22 1630  cefTRIAXone (ROCEPHIN) 1 g in sodium chloride 0.9 % 100 mL IVPB  Status:  Discontinued        1 g 200 mL/hr over 30 Minutes Intravenous  Once 04/01/22 1628 04/01/22 1737      Culture/Microbiology    Component Value Date/Time   SDES  04/02/2022 1415    URINE, CLEAN CATCH Performed at [facility], [place] [address] [place], [place] [address]    SPECREQUEST  04/02/2022 1415    NONE Performed at [facility], [place] [address], [place], [place] [address]    CULT (A) 04/02/2022 1415    >=100,000 COLONIES/mL ENTEROCOCCUS FAECALIS 10,000 COLONIES/mL PSEUDOMONAS AERUGINOSA SUSCEPTIBILITIES TO FOLLOW Performed at [facility], [place] [address], [place],  [address]    REPTSTATUS PENDING 04/02/2022 1415    Radiology Studies: No results found.   LOS: 3 days   Antonieta Pert, MD [facility]  04/04/2022, 3:43 PM

## 2022-04-04 NOTE — Evaluation (Signed)
Occupational Therapy Evaluation Patient Details Name: Tyler [name] MRN: [id number] DOB: 1929-01-18 Today's Date: 04/04/2022   History of Present Illness Patient is a 88 year old male with a history of bladder cancer, hyperlipidemia, CKD stage IIIa, coronary disease status post CABG, hypertension admitted with symptomatic anemia with Hgb 6.7 - pt with admit earlier this month with Hgb 5.9.   Clinical Impression   Patient is a 88 year old male who was admitted for above. Patient was living at home with wife and daughter daily support for 5 hours.patient was min A for transfers with RW and noted shakiness with transition from sit to stands with each attempt in room.  Patient was noted to have decreased functional activity tolerance, decreased endurance, decreased standing balance, decreased safety awareness, and decreased knowledge of AD/AE impacting participation in ADLs. Patient would continue to benefit from skilled OT services at this time while admitted and after d/c to address noted deficits in order to improve overall safety and independence in ADLs.         Recommendations for follow up therapy are one component of a multi-disciplinary discharge planning process, led by the attending physician.  Recommendations may be updated based on patient status, additional functional criteria and insurance authorization.   Follow Up Recommendations  Home health OT    Assistance Recommended at Discharge Frequent or constant Supervision/Assistance  Patient can return home with the following A lot of help with bathing/dressing/bathroom;A little help with walking and/or transfers;Assistance with cooking/housework;Direct supervision/assist for financial management;Assist for transportation;Help with stairs or ramp for entrance;Direct supervision/assist for medications management    Functional Status Assessment  Patient has had a recent decline in their functional status and demonstrates the ability to  make significant improvements in function in a reasonable and predictable amount of time.  Equipment Recommendations       Recommendations for Other Services       Precautions / Restrictions Precautions Precautions: Fall Restrictions Weight Bearing Restrictions: No      Mobility Bed Mobility Overal bed mobility: Needs Assistance Bed Mobility: Supine to Sit     Supine to sit: Min assist     General bed mobility comments: Increased time with HOB elevated and use of bed rail  Assist to complete rotation to EOB sitting using bed pad    Transfers                          Balance Overall balance assessment: Needs assistance Sitting-balance support: Feet supported, No upper extremity supported Sitting balance-Leahy Scale: Fair     Standing balance support: Bilateral upper extremity supported Standing balance-Leahy Scale: Poor                             ADL either performed or assessed with clinical judgement   ADL Overall ADL's : Needs assistance/impaired Eating/Feeding: Sitting;Supervision/ safety   Grooming: Wash/dry face;Wash/dry hands;Min guard;Sitting   Upper Body Bathing: Set up;Sitting Upper Body Bathing Details (indicate cue type and reason): EOB Lower Body Bathing: Moderate assistance;Sit to/from stand;Sitting/lateral leans   Upper Body Dressing : Sitting;Min guard Upper Body Dressing Details (indicate cue type and reason): EOB Lower Body Dressing: Maximal assistance;Sit to/from stand;Sitting/lateral leans   Toilet Transfer: Minimal assistance;Rolling walker (2 wheels);BSC/3in1 Toilet Transfer Details (indicate cue type and reason): transfer to 3in1 then to recliner. noted shakiness in BLE with sit to stand transitios but noe during movement. Toileting- Clothing Manipulation and Hygiene:  Maximal assistance;Sit to/from stand Toileting - Clothing Manipulation Details (indicate cue type and reason): needed BUE support for standing  balance.             Vision Patient Visual Report: No change from baseline       Perception     Praxis      Pertinent Vitals/Pain Pain Assessment Pain Assessment: No/denies pain     Hand Dominance Right   Extremity/Trunk Assessment     Lower Extremity Assessment Lower Extremity Assessment: Defer to PT evaluation   Cervical / Trunk Assessment Cervical / Trunk Assessment: Kyphotic   Communication Communication Communication: HOH   Cognition Arousal/Alertness: Awake/alert Behavior During Therapy: WFL for tasks assessed/performed Overall Cognitive Status: Within Functional Limits for tasks assessed                                       General Comments       Exercises     Shoulder Instructions      Home Living Family/patient expects to be discharged to:: Private residence Living Arrangements: Spouse/significant other Available Help at Discharge: Available 24 hours/day Type of Home: House Home Access: Stairs to enter [employer] of Steps: 3 Entrance Stairs-Rails: Can reach both;Right;Left Home Layout: One level     Bathroom Shower/Tub: Occupational psychologist: Standard     Home Equipment: Conservation officer, nature (2 wheels);Shower seat;Grab bars - toilet;Grab bars - tub/shower;Wheelchair - manual;BSC/3in1   Additional Comments: Daughter has a Civil Service fast streamer from another family member she can bring in. They also have a lift chair, but don't use it as a lift chair at the moment.      Prior Functioning/Environment Prior Level of Function : Independent/Modified Independent             Mobility Comments: Amb short household distances with RW, primarily bed to chair and the bathroom. S for longer distances and uses w/c for appointments. ADLs Comments: patient reported being independent most of time with daughter and wife support when needed.        OT Problem List: Decreased strength;Decreased activity tolerance;Decreased  safety awareness;Decreased knowledge of precautions;Decreased knowledge of use of DME or AE      OT Treatment/Interventions: Self-care/ADL training;Therapeutic exercise;Neuromuscular education;Energy conservation;DME and/or AE instruction;Therapeutic activities;Balance training;Patient/family education    OT Goals(Current goals can be found in the care plan section) Acute Rehab OT Goals Patient Stated Goal: to get better OT Goal Formulation: With patient/family Time For Goal Achievement: 04/18/22 Potential to Achieve Goals: Fair  OT Frequency: Min 2X/week    Co-evaluation              AM-PAC OT "6 Clicks" Daily Activity     Outcome Measure Help from another person eating meals?: None Help from another person taking care of personal grooming?: A Little Help from another person toileting, which includes using toliet, bedpan, or urinal?: A Lot Help from another person bathing (including washing, rinsing, drying)?: A Lot Help from another person to put on and taking off regular upper body clothing?: A Little Help from another person to put on and taking off regular lower body clothing?: A Lot 6 Click Score: 16   End of Session Equipment Utilized During Treatment: Gait belt;Rolling walker (2 wheels) Nurse Communication: Mobility status  Activity Tolerance: Patient tolerated treatment well Patient left: in chair;with call bell/phone within reach  OT Visit Diagnosis: Unsteadiness on feet (  R26.81);Muscle weakness (generalized) (M62.81)                Time: 5520-8022 OT Time Calculation (min): 44 min Charges:  OT General Charges $OT Visit: 1 Visit OT Evaluation $OT Eval Moderate Complexity: 1 Mod OT Treatments $Self Care/Home Management : 23-37 mins  Rennie Plowman, MS Acute Rehabilitation Department Office# [phone]   Marcellina Millin 04/04/2022, 12:33 PM

## 2022-04-05 LAB — CBC
HCT: 27.1 % — ABNORMAL LOW (ref 39.0–52.0)
Hemoglobin: 7.9 g/dL — ABNORMAL LOW (ref 13.0–17.0)
MCH: 26.2 pg (ref 26.0–34.0)
MCHC: 29.2 g/dL — ABNORMAL LOW (ref 30.0–36.0)
MCV: 89.7 fL (ref 80.0–100.0)
Platelets: 200 10*3/uL (ref 150–400)
RBC: 3.02 MIL/uL — ABNORMAL LOW (ref 4.22–5.81)
RDW: 19.4 % — ABNORMAL HIGH (ref 11.5–15.5)
WBC: 9.8 10*3/uL (ref 4.0–10.5)
nRBC: 0 % (ref 0.0–0.2)

## 2022-04-05 LAB — URINE CULTURE: Culture: >=100000 — AB

## 2022-04-05 LAB — BASIC METABOLIC PANEL
Anion gap: 5 (ref 5–15)
BUN: 21 mg/dL (ref 8–23)
CO2: 23 mmol/L (ref 22–32)
Calcium: 8 mg/dL — ABNORMAL LOW (ref 8.9–10.3)
Chloride: 113 mmol/L — ABNORMAL HIGH (ref 98–111)
Creatinine, Ser: 1.4 mg/dL — ABNORMAL HIGH (ref 0.61–1.24)
GFR, Estimated: 48 mL/min — ABNORMAL LOW (ref >60)
Glucose, Bld: 104 mg/dL — ABNORMAL HIGH (ref 70–99)
Potassium: 3.8 mmol/L (ref 3.5–5.1)
Sodium: 141 mmol/L (ref 135–145)

## 2022-04-05 MED ORDER — AMOXICILLIN 500 MG PO CAPS: 500.0000 mg | ORAL_CAPSULE | Freq: Three times a day (TID) | ORAL | 0 refills | Status: AC

## 2022-04-05 MED ORDER — PANTOPRAZOLE SODIUM 40 MG PO TBEC: 40.0000 mg | DELAYED_RELEASE_TABLET | Freq: Every day | ORAL | 0 refills | Status: AC

## 2022-04-05 NOTE — Progress Notes (Signed)
Mobility Specialist - Progress Note   04/05/22 1351  Mobility  Activity Ambulated with assistance in hallway  Level of Assistance Minimal assist, patient does 75% or more  Assistive Device Front wheel walker  Distance Ambulated (ft) 80 ft  Activity Response Tolerated well  $Mobility charge 1 Mobility   Pt received in chair and agreed to ambulate. No c/o pain nor discomfort during ambulation. Pt returned to bed with all needs met and family in room.   Roderick Pee Mobility Specialist

## 2022-04-05 NOTE — Progress Notes (Signed)
Physical Therapy Treatment Patient Details Name: Tyler [name] MRN: [id number] DOB: [birth year] Today's Date: 04/05/2022   History of Present Illness Patient is a 88 year old male with a history of bladder cancer, hyperlipidemia, CKD stage IIIa, coronary disease status post CABG, hypertension admitted with symptomatic anemia with Hgb 6.7 - pt with admit earlier this month with Hgb 5.9.    PT Comments    Progressing with mobility. Daughter present during session-reports plan is either for home today or tomorrow. Pt is planning to resume home health care. Recommend mobility with nursing assistance for safety while in hospital.     Recommendations for follow up therapy are one component of a multi-disciplinary discharge planning process, led by the attending physician.  Recommendations may be updated based on patient status, additional functional criteria and insurance authorization.  Follow Up Recommendations  Home health PT     Assistance Recommended at Discharge Frequent or constant Supervision/Assistance  Patient can return home with the following A little help with walking and/or transfers;A little help with bathing/dressing/bathroom;Help with stairs or ramp for entrance;Assistance with cooking/housework;Assist for transportation;Direct supervision/assist for medications management   Equipment Recommendations  None recommended by PT    Recommendations for Other Services       Precautions / Restrictions Precautions Precautions: Fall Restrictions Weight Bearing Restrictions: No     Mobility  Bed Mobility Overal bed mobility: Needs Assistance Bed Mobility: Supine to Sit     Supine to sit: Min assist     General bed mobility comments: Increased time with HOB elevated and use of bed rail  Assist to complete rotation to EOB sitting using bed pad. Cues required.    Transfers Overall transfer level: Needs assistance Equipment used: Rolling walker (2 wheels) Transfers: Sit  to/from Stand Sit to Stand: From elevated surface, Min assist           General transfer comment: Cues for hand placement. Assist to power up, stabilize.    Ambulation/Gait Ambulation/Gait assistance: Min assist Gait Distance (Feet): 65 Feet Assistive device: Rolling walker (2 wheels) Gait Pattern/deviations: Step-through pattern, Decreased stride length       General Gait Details: Steadying assistance as needed. Cues for safety.   Stairs             Wheelchair Mobility    Modified Rankin (Stroke Patients Only)       Balance Overall balance assessment: Needs assistance         Standing balance support: Bilateral upper extremity supported Standing balance-Leahy Scale: Poor                              Cognition Arousal/Alertness: Awake/alert Behavior During Therapy: WFL for tasks assessed/performed Overall Cognitive Status: Within Functional Limits for tasks assessed                                          Exercises      General Comments        Pertinent Vitals/Pain Pain Assessment Pain Assessment: No/denies pain    Home Living                          Prior Function            PT Goals (current goals can now be found in the care plan section) Progress towards  PT goals: Progressing toward goals    Frequency    Min 3X/week      PT Plan Current plan remains appropriate    Co-evaluation              AM-PAC PT "6 Clicks" Mobility   Outcome Measure  Help needed turning from your back to your side while in a flat bed without using bedrails?: A Little Help needed moving from lying on your back to sitting on the side of a flat bed without using bedrails?: A Little Help needed moving to and from a bed to a chair (including a wheelchair)?: A Little Help needed standing up from a chair using your arms (e.g., wheelchair or bedside chair)?: A Little Help needed to walk in hospital room?: A  Little Help needed climbing 3-5 steps with a railing? : Total 6 Click Score: 16    End of Session Equipment Utilized During Treatment: Gait belt Activity Tolerance: Patient tolerated treatment well Patient left: in chair;with call bell/phone within reach;with family/visitor present   PT Visit Diagnosis: Muscle weakness (generalized) (M62.81);Difficulty in walking, not elsewhere classified (R26.2)     Time: 9030-0923 PT Time Calculation (min) (ACUTE ONLY): 14 min  Charges:  $Gait Training: 8-22 mins                         Doreatha Massed, PT Acute Rehabilitation  Office: [phone] Pager: [phone]

## 2022-04-05 NOTE — Progress Notes (Signed)
Baron Sane 2:04 PM  Subjective: Patient seen and examined and discussed with his daughter again we did review his capsule endoscopy findings and there may have been one mid small bowel AVM without signs of bleeding and he has no current complaints and his main problems currently seem to be urologic and we discussed outpatient follow-up iron etc. answered all of their questions including frustrations over getting records from the [place]  Objective: Vital signs stable afebrile no acute distress abdomen is soft nontender hemoglobin minimal decrease creatinine slight increase  Assessment: Multiple medical problems including periodic guaiac positive anemia on aspirin and Plavix but nondiagnostic recent GI work-up  Plan: We will check on tomorrow but please call if any specific GI question or problem during this hospital stay and happy to see back in outpatient GI follow-up which was discussed with the patient and his daughter  [facility]  office [phone] After 5PM or if no answer call [phone]

## 2022-04-05 NOTE — Progress Notes (Signed)
Patient with urge to void.  Hasn't been able to void since prior straight cath on dayshift.  BS showing 750m.  Pt able to void aprox 371mpink tinged urine.  Foley catheter placed per order with immediate return of 70070mlear yellow urine.  Later urine tan/pink and milky in appearance.  Pt tolerated well.  Pericare performed before and after insertion and bedpads changed.

## 2022-05-01 ENCOUNTER — Emergency Department (HOSPITAL_BASED_OUTPATIENT_CLINIC_OR_DEPARTMENT_OTHER)
Admission: EM | Admit: 2022-05-01 | Discharge: 2022-05-01 | Disposition: A | Discharge status: Home / Self Care | Payer: No Typology Code available for payment source | Attending: Student | Admitting: Student

## 2022-05-01 ENCOUNTER — Other Ambulatory Visit: Payer: Self-pay

## 2022-05-01 ENCOUNTER — Encounter (HOSPITAL_BASED_OUTPATIENT_CLINIC_OR_DEPARTMENT_OTHER): Payer: Self-pay | Admitting: Emergency Medicine

## 2022-05-01 DIAGNOSIS — Z8551 Personal history of malignant neoplasm of bladder: Secondary | ICD-10-CM | POA: Insufficient documentation

## 2022-05-01 DIAGNOSIS — R82998 Other abnormal findings in urine: Secondary | ICD-10-CM | POA: Diagnosis not present

## 2022-05-01 DIAGNOSIS — Z955 Presence of coronary angioplasty implant and graft: Secondary | ICD-10-CM | POA: Insufficient documentation

## 2022-05-01 DIAGNOSIS — I10 Essential (primary) hypertension: Secondary | ICD-10-CM | POA: Diagnosis not present

## 2022-05-01 DIAGNOSIS — Z79899 Other long term (current) drug therapy: Secondary | ICD-10-CM | POA: Insufficient documentation

## 2022-05-01 DIAGNOSIS — Z96642 Presence of left artificial hip joint: Secondary | ICD-10-CM | POA: Insufficient documentation

## 2022-05-01 DIAGNOSIS — Z7982 Long term (current) use of aspirin: Secondary | ICD-10-CM | POA: Diagnosis not present

## 2022-05-01 DIAGNOSIS — Z466 Encounter for fitting and adjustment of urinary device: Secondary | ICD-10-CM

## 2022-05-01 DIAGNOSIS — I2510 Atherosclerotic heart disease of native coronary artery without angina pectoris: Secondary | ICD-10-CM | POA: Diagnosis not present

## 2022-05-01 DIAGNOSIS — Z85038 Personal history of other malignant neoplasm of large intestine: Secondary | ICD-10-CM | POA: Diagnosis not present

## 2022-05-01 DIAGNOSIS — T83098A Other mechanical complication of other indwelling urethral catheter, initial encounter: Secondary | ICD-10-CM | POA: Diagnosis present

## 2022-05-01 NOTE — ED Notes (Signed)
Discharge paperwork given and verbally understood. 

## 2022-05-01 NOTE — ED Notes (Signed)
No obvious bleeding after insertion and MD Kommar verified placement.

## 2022-05-01 NOTE — ED Triage Notes (Signed)
Pt is on plavix 

## 2022-05-01 NOTE — ED Triage Notes (Signed)
Pt has foley in place (placed on [place] treatment for bladder cancer. Noticed it is leaking around catheter today and some hematuria, feels more pressure than normal

## 2022-05-01 NOTE — ED Provider Notes (Signed)
[facility] EMERGENCY DEPT Provider Note  CSN: [id number] Arrival date & time: 05/01/22 1051  Chief Complaint(s) urinary catheter  (Placed on wedesday, leaking)  HPI Tyler [name] is a 88 y.o. male who presents emergency department for evaluation of a urinary catheter problem.  History obtained from patient's daughter who states that the urinary catheter was placed 5 days ago and they noticed leaking at the urethral meatus today with minimal urinary output in the bag.  They suspect the patient may have accidentally dislodged the catheter overnight.  The patient has underlying dementia and is unable to provide appropriate history today.  Patient hemodynamically stable on arrival.   Past Medical History Past Medical History:  Diagnosis Date   AAA (abdominal aortic aneurysm) ([place])    a. 05/2014 s/p stent grafting [facility]).   Bronchitis    CAD (coronary artery disease)    a. 06/2013 VF Arrest/Cath: 3VD, EF 50%;  b. 06/2013 CABG x 4: LIMA->LAD, VG->PDA->RPL, VG->OM.   CVA (cerebral infarction)    Erectile dysfunction    Essential hypertension    GERD (gastroesophageal reflux disease)    H/O hiatal hernia    HOH (hard of hearing)    wears hearing aid in right ear   Hyperlipidemia    Osteoarthritis    Pneumonia    YRS AGO   Stroke [facility])    Patient Active Problem List   Diagnosis Date Noted   Benign neoplasm of ascending colon    Fecal occult blood test positive    GI bleed 02/25/2022   Gross hematuria 02/25/2022   Malignant neoplasm of bladder, unspecified ([place]) 02/25/2022   Symptomatic anemia 02/25/2022   ABLA (acute blood loss anemia) 02/24/2022   Physical deconditioning 03/25/2021   Transient ischemic attack (TIA) 03/25/2021   Transient ischemic attack 03/24/2021   Stenosis of right carotid artery    Arthritis of left hip 09/01/2014   AAA (abdominal aortic aneurysm) ([facility])    Essential hypertension    CAD (coronary artery disease)    S/P CABG x 4  06/27/2013   Hyperlipidemia    Home Medication(s) Prior to Admission medications   Medication Sig Start Date End Date Taking? Authorizing Provider  aspirin EC 81 MG tablet Take 1 tablet (81 mg total) by mouth daily. Swallow whole. Hold if  overt external bleeding 02/27/22   Florencia Reasons, MD  atorvastatin (LIPITOR) 40 MG tablet Take 40 mg by mouth at bedtime.    [provider]  clopidogrel (PLAVIX) 75 MG tablet Take 1 tablet (75 mg total) by mouth daily. Hold plavix, resume when ok with urology Patient taking differently: Take 75 mg by mouth in the morning. 02/27/22   Florencia Reasons, MD  ferrous gluconate (FERGON) 324 MG tablet Take 324 mg by mouth daily with breakfast.    [provider]  furosemide (LASIX) 20 MG tablet Take 1 tablet (20 mg total) by mouth daily as needed (swelling). Patient taking differently: Take 20 mg by mouth daily as needed (for swelling). 03/02/22   Belva Crome, MD  metoprolol tartrate (LOPRESSOR) 25 MG tablet Take 0.5 tablets (12.5 mg total) by mouth 2 (two) times daily. 08/29/13   Belva Crome, MD  nitroGLYCERIN (NITROSTAT) 0.4 MG SL tablet Place 1 tablet (0.4 mg total) under the tongue every 5 (five) minutes as needed for chest pain. 10/29/21   Belva Crome, MD  pantoprazole (PROTONIX) 40 MG tablet Take 1 tablet (40 mg total) by mouth daily. 04/05/22 05/05/22  Antonieta Pert, MD  terazosin (HYTRIN) 5 MG capsule Take 5 mg by mouth at bedtime.    [provider]                                                                                                                                    Past Surgical History Past Surgical History:  Procedure Laterality Date   COLONOSCOPY WITH PROPOFOL N/A 02/26/2022   Procedure: COLONOSCOPY WITH PROPOFOL;  Surgeon: Daryel November, MD;  Location: [facility];  Service: Gastroenterology;  Laterality: N/A;   CORONARY ARTERY BYPASS GRAFT N/A 06/27/2013   Procedure: CORONARY ARTERY BYPASS GRAFTING (CABG);  Surgeon: Ivin Poot, MD;  Location: [facility];  Service: Open Heart Surgery;  Laterality: N/A;  times four utilizing the left internal mammary artery and the right greater saphenous vein harvested endoscopically   ENDOVEIN HARVEST OF GREATER SAPHENOUS VEIN Right 06/27/2013   Procedure: ENDOVEIN HARVEST OF GREATER SAPHENOUS VEIN;  Surgeon: Ivin Poot, MD;  Location: [facility];  Service: Open Heart Surgery;  Laterality: Right;   ESOPHAGOGASTRODUODENOSCOPY (EGD) WITH PROPOFOL N/A 02/25/2022   Procedure: ESOPHAGOGASTRODUODENOSCOPY (EGD) WITH PROPOFOL;  Surgeon: Carol Ada, MD;  Location: [facility];  Service: Gastroenterology;  Laterality: N/A;   EYE SURGERY     bilateral cataracts   GIVENS CAPSULE STUDY N/A 02/26/2022   Procedure: GIVENS CAPSULE STUDY;  Surgeon: Daryel November, MD;  [facility]: [facility];  Service: Gastroenterology;  Laterality: N/A;   HEMOSTASIS CLIP PLACEMENT  02/26/2022   Procedure: HEMOSTASIS CLIP PLACEMENT;  Surgeon: Daryel November, MD;  Location[facility];  Service: Gastroenterology;;   [facility] EAR SURGERY Left 1982   INNER EAR SURGERY     severed a nerve in his left ear.    LEFT HEART CATHETERIZATION WITH CORONARY ANGIOGRAM N/A 06/27/2013   Procedure: LEFT HEART CATHETERIZATION WITH CORONARY ANGIOGRAM;  Surgeon: Sinclair Grooms, MD;  Location: [facility] CATH LAB;  Service: Cardiovascular;  Laterality: N/A;  iabp insertion, cpr, intubation, emergent cvts   POLYPECTOMY  02/26/2022   Procedure: POLYPECTOMY;  Surgeon: Daryel November, MD;  [facility];  Service: Gastroenterology;;   TEE WITHOUT CARDIOVERSION N/A 06/27/2013   Procedure: TRANSESOPHAGEAL ECHOCARDIOGRAM (TEE);  Surgeon: Ivin Poot, MD;  Location: [facility];  Service: Open Heart Surgery;  Laterality: N/A;   TONSILLECTOMY     TOTAL HIP ARTHROPLASTY Left 09/01/2014   Procedure: TOTAL HIP ARTHROPLASTY;  Surgeon: Kerin Salen, MD;  Location: [facility];  Service: Orthopedics;  Laterality: Left;   Family History Family  History  Problem Relation Age of Onset   Heart disease Sister    Heart disease Brother    Heart attack Sister    Heart attack Brother    Colon cancer Neg Hx    Hypertension Neg Hx    Stroke Neg Hx     Social History Social History   Tobacco Use   Smoking status: Never  Smokeless tobacco: Never  Vaping Use   Vaping Use: Never used  Substance Use Topics   Alcohol use: Never    Comment: Rare   Drug use: No   Allergies Patient has no known allergies.  Review of Systems Review of Systems  Unable to perform ROS: Dementia    Physical Exam Vital Signs  I have reviewed the triage vital signs BP 100/60   Pulse 67   Temp (!) 96.8 F (36 C) (Tympanic)   Resp 20   SpO2 100%   Physical Exam Constitutional:      General: He is not in acute distress.    Appearance: Normal appearance.  HENT:     Head: Normocephalic and atraumatic.     Nose: No congestion or rhinorrhea.  Eyes:     General:        Right eye: No discharge.        Left eye: No discharge.     Extraocular Movements: Extraocular movements intact.     Pupils: Pupils are equal, round, and reactive to light.  Cardiovascular:     Rate and Rhythm: Normal rate and regular rhythm.     Heart sounds: No murmur heard. Pulmonary:     Effort: No respiratory distress.     Breath sounds: No wheezing or rales.  Abdominal:     General: There is no distension.     Tenderness: There is no abdominal tenderness.  Musculoskeletal:        General: Normal range of motion.     Cervical back: Normal range of motion.  Skin:    General: Skin is warm and dry.  Neurological:     General: No focal deficit present.     Mental Status: He is alert.     ED Results and Treatments Labs (all labs ordered are listed, but only abnormal results are displayed) Labs Reviewed - No data to display                                                                                                                        Radiology No results  found.  Pertinent labs & imaging results that were available during my care of the patient were reviewed by me and considered in my medical decision making (see MDM for details).  Medications Ordered in ED Medications - No data to display  Procedures Procedures  (including critical care time)  Medical Decision Making / ED Course   This patient presents to the ED for concern of urinary catheter problem, this involves an extensive number of treatment options, and is a complaint that carries with it a high risk of complications and morbidity.  The differential diagnosis includes dislodged Foley catheter, clogged Foley catheter  MDM: Patient seen emergency room for evaluation of Foley catheter problem.  Foley catheter was replaced at bedside with evacuation of 750 cc of urine.  Patient overall well-appearing after catheter replacement and patient discharged.   Additional history obtained: -Additional history obtained from patient's daughter -External records from outside source obtained and reviewed including: Chart review including previous notes, labs, imaging, consultation notes     Medicines ordered and prescription drug management: No orders of the defined types were placed in this encounter.   -I have reviewed the patients home medicines and have made adjustments as needed  Critical interventions none  Cardiac Monitoring: The patient was maintained on a cardiac monitor.  I personally viewed and interpreted the cardiac monitored which showed an underlying rhythm of: NSR  Social Determinants of Health:  Factors impacting patients care include: none   Reevaluation: After the interventions noted above, I reevaluated the patient and found that they have :improved  Co morbidities that complicate the patient evaluation  Past Medical History:   Diagnosis Date   AAA (abdominal aortic aneurysm) ([place])    a. 05/2014 s/p stent grafting [facility]).   Bronchitis    CAD (coronary artery disease)    a. 06/2013 VF Arrest/Cath: 3VD, EF 50%;  b. 06/2013 CABG x 4: LIMA->LAD, VG->PDA->RPL, VG->OM.   CVA (cerebral infarction)    Erectile dysfunction    Essential hypertension    GERD (gastroesophageal reflux disease)    H/O hiatal hernia    HOH (hard of hearing)    wears hearing aid in right ear   Hyperlipidemia    Osteoarthritis    Pneumonia    YRS AGO   Stroke [facility])       Dispostion: I considered admission for this patient, but he does not meet inpatient criteria for admission and is safe for dc with outpt fu     Final Clinical Impression(s) / ED Diagnoses Final diagnoses:  None     '@PCDICTATION'$ @    Teressa Lower, MD 05/01/22 1829

## 2022-05-23 ENCOUNTER — Ambulatory Visit: Payer: Self-pay | Admitting: *Deleted

## 2022-05-23 NOTE — Telephone Encounter (Signed)
  Chief Complaint: urinary catheter leaking  per patient's daughter not with patient now . Requesting if correct size catheter can be replaced , daughter does not now size at this time Symptoms: urine leaking from catheter site. Reports patient may have pulled catheter accidentally. Not pulled completely out still draining urine. No report bleeding from catheter. Frequency: today  Pertinent Negatives: Patient denies na  Disposition: '[x]'$ ED /'[]'$ Urgent Care (no appt availability in office) / '[]'$ Appointment(In office/virtual)/ '[]'$  [place]/ '[]'$ Home Care/ '[]'$ Refused Recommended Disposition /'[]'$ [place] [facility]/ '[]'$  Follow-up with PCP Additional Notes:   Recommended calling urologist . Recommended to go to ED if [facility] RN does not call back go to ED.     Reason for Disposition  Catheter was accidentally pulled-out    Not pulled completely out still  Answer Assessment - Initial Assessment Questions 1. SYMPTOMS: "What symptoms are you concerned about?"     Daughter not with patient. Reports urinary catheter leaking.  2. ONSET:  "When did the symptoms start?"     Na  3. FEVER: "Is there a fever?" If Yes, ask: "What is the temperature, how was it measured, and when did it start?"     na 4. ABDOMEN PAIN: "Is there any abdomen pain?" (e.g., Scale 1-10; or mild, moderate, severe)     na 5. URINE COLOR: "What color is the urine?"  "Is there blood present in the urine?" (e.g., clear, yellow, cloudy, tea-colored, blood streaks, bright red)     na 6. URINE AMOUNT: "When did you last empty the urine from the collection bag?" "How much urine was in the bag at that time?" How much urine is in the collection bag now?"     na 7. INSERTION: "How long have you (they) had the catheter?"     Not sure due to be changed Wednesday from urology  8. OTHER SYMPTOMS: "Are there any other symptoms?" (e.g., abdomen swelling, back pain, bladder spasms, constipation, foul smelling urine, leaking of urine)       Leaking of urine  9. MEDICINES: "Are you taking any medicines to treat urinary problems?" (e.g., antibiotics for a urinary tract infection, medicines to treat bladder spasms)      na 10. PREGNANCY: "Is there any chance you are pregnant?" "When was your last menstrual period?"       na  Protocols used: Urinary Catheter (e.g., Foley) Symptoms and Questions-A-AH

## 2022-08-17 ENCOUNTER — Emergency Department (HOSPITAL_COMMUNITY)
Admission: EM | Admit: 2022-08-17 | Discharge: 2022-08-18 | Disposition: A | Discharge status: Home / Self Care | Payer: No Typology Code available for payment source | Attending: Emergency Medicine | Admitting: Emergency Medicine

## 2022-08-17 ENCOUNTER — Emergency Department (HOSPITAL_COMMUNITY): Payer: No Typology Code available for payment source

## 2022-08-17 ENCOUNTER — Encounter (HOSPITAL_COMMUNITY): Payer: Self-pay

## 2022-08-17 DIAGNOSIS — R911 Solitary pulmonary nodule: Secondary | ICD-10-CM | POA: Diagnosis not present

## 2022-08-17 DIAGNOSIS — N390 Urinary tract infection, site not specified: Secondary | ICD-10-CM | POA: Insufficient documentation

## 2022-08-17 DIAGNOSIS — I872 Venous insufficiency (chronic) (peripheral): Secondary | ICD-10-CM | POA: Diagnosis not present

## 2022-08-17 DIAGNOSIS — J90 Pleural effusion, not elsewhere classified: Secondary | ICD-10-CM | POA: Diagnosis not present

## 2022-08-17 DIAGNOSIS — Z7982 Long term (current) use of aspirin: Secondary | ICD-10-CM | POA: Diagnosis not present

## 2022-08-17 DIAGNOSIS — R39198 Other difficulties with micturition: Secondary | ICD-10-CM | POA: Diagnosis present

## 2022-08-17 DIAGNOSIS — I509 Heart failure, unspecified: Secondary | ICD-10-CM | POA: Diagnosis not present

## 2022-08-17 DIAGNOSIS — Z79899 Other long term (current) drug therapy: Secondary | ICD-10-CM | POA: Diagnosis not present

## 2022-08-17 DIAGNOSIS — Z7902 Long term (current) use of antithrombotics/antiplatelets: Secondary | ICD-10-CM | POA: Diagnosis not present

## 2022-08-17 DIAGNOSIS — Z951 Presence of aortocoronary bypass graft: Secondary | ICD-10-CM | POA: Diagnosis not present

## 2022-08-17 DIAGNOSIS — I2510 Atherosclerotic heart disease of native coronary artery without angina pectoris: Secondary | ICD-10-CM | POA: Diagnosis not present

## 2022-08-17 DIAGNOSIS — I110 Hypertensive heart disease with heart failure: Secondary | ICD-10-CM | POA: Insufficient documentation

## 2022-08-17 DIAGNOSIS — Z8673 Personal history of transient ischemic attack (TIA), and cerebral infarction without residual deficits: Secondary | ICD-10-CM | POA: Diagnosis not present

## 2022-08-17 LAB — CBC WITH DIFFERENTIAL/PLATELET
Abs Immature Granulocytes: 0.02 10*3/uL (ref 0.00–0.07)
Basophils Absolute: 0.1 10*3/uL (ref 0.0–0.1)
Basophils Relative: 1 %
Eosinophils Absolute: 0.2 10*3/uL (ref 0.0–0.5)
Eosinophils Relative: 3 %
HCT: 36.7 % — ABNORMAL LOW (ref 39.0–52.0)
Hemoglobin: 11.5 g/dL — ABNORMAL LOW (ref 13.0–17.0)
Immature Granulocytes: 0 %
Lymphocytes Relative: 25 %
Lymphs Abs: 1.8 10*3/uL (ref 0.7–4.0)
MCH: 29.2 pg (ref 26.0–34.0)
MCHC: 31.3 g/dL (ref 30.0–36.0)
MCV: 93.1 fL (ref 80.0–100.0)
Monocytes Absolute: 0.8 10*3/uL (ref 0.1–1.0)
Monocytes Relative: 11 %
Neutro Abs: 4.4 10*3/uL (ref 1.7–7.7)
Neutrophils Relative %: 60 %
Platelets: 177 10*3/uL (ref 150–400)
RBC: 3.94 MIL/uL — ABNORMAL LOW (ref 4.22–5.81)
RDW: 17.1 % — ABNORMAL HIGH (ref 11.5–15.5)
WBC: 7.3 10*3/uL (ref 4.0–10.5)
nRBC: 0 % (ref 0.0–0.2)

## 2022-08-17 LAB — COMPREHENSIVE METABOLIC PANEL
ALT: 14 U/L (ref 0–44)
AST: 20 U/L (ref 15–41)
Albumin: 3.5 g/dL (ref 3.5–5.0)
Alkaline Phosphatase: 64 U/L (ref 38–126)
Anion gap: 10 (ref 5–15)
BUN: 18 mg/dL (ref 8–23)
CO2: 22 mmol/L (ref 22–32)
Calcium: 8.8 mg/dL — ABNORMAL LOW (ref 8.9–10.3)
Chloride: 107 mmol/L (ref 98–111)
Creatinine, Ser: 1.26 mg/dL — ABNORMAL HIGH (ref 0.61–1.24)
GFR, Estimated: 54 mL/min — ABNORMAL LOW (ref >60)
Glucose, Bld: 88 mg/dL (ref 70–99)
Potassium: 4 mmol/L (ref 3.5–5.1)
Sodium: 139 mmol/L (ref 135–145)
Total Bilirubin: 0.7 mg/dL (ref 0.3–1.2)
Total Protein: 6.8 g/dL (ref 6.5–8.1)

## 2022-08-17 LAB — URINALYSIS, ROUTINE W REFLEX MICROSCOPIC
Bilirubin Urine: NEGATIVE
Glucose, UA: NEGATIVE mg/dL
Ketones, ur: NEGATIVE mg/dL
Nitrite: NEGATIVE
Protein, ur: NEGATIVE mg/dL
Specific Gravity, Urine: 1.005 (ref 1.005–1.030)
WBC, UA: >50 WBC/hpf — ABNORMAL HIGH (ref 0–5)
pH: 6 (ref 5.0–8.0)

## 2022-08-17 LAB — BRAIN NATRIURETIC PEPTIDE: B Natriuretic Peptide: 354.3 pg/mL — ABNORMAL HIGH (ref 0.0–100.0)

## 2022-08-17 LAB — TROPONIN I (HIGH SENSITIVITY): Troponin I (High Sensitivity): 10 ng/L (ref <18)

## 2022-08-17 LAB — T4, FREE: Free T4: 0.88 ng/dL (ref 0.61–1.12)

## 2022-08-17 MED ORDER — CEPHALEXIN 500 MG PO CAPS: 500.0000 mg | ORAL_CAPSULE | Freq: Three times a day (TID) | ORAL | 0 refills | Status: AC

## 2022-08-17 MED ORDER — PHENAZOPYRIDINE HCL 200 MG PO TABS: 200.0000 mg | ORAL_TABLET | Freq: Three times a day (TID) | ORAL | 0 refills | Status: DC

## 2022-08-17 MED ORDER — SODIUM CHLORIDE 0.9 % IV SOLN
1.0000 g | Freq: Once | INTRAVENOUS | Status: AC
  Administered 2022-08-17: 1 g via INTRAVENOUS
  Filled 2022-08-17: qty 10

## 2022-08-17 MED ORDER — IOHEXOL 350 MG/ML SOLN
75.0000 mL | Freq: Once | INTRAVENOUS | Status: AC | PRN
  Administered 2022-08-17: 75 mL via INTRAVENOUS

## 2022-08-17 NOTE — ED Triage Notes (Signed)
Pt BIB EMS due to edema from home. 3 days ago pt got swelling bilateral lower extremities. Pt is prescribed PRN lasix. Pt took half the pill lat night and BP was 90 systolic. Pt is independent at home. Legs are warm and red to touch. Pt has had urinary rentention. No hx of CHF. Axox4. Pt is on room air.

## 2022-08-17 NOTE — Discharge Instructions (Addendum)
It was a pleasure caring for you today in the emergency department.  follow-up PCP for repeat imaging in 3 months in regards to pulmonary nodule  Please return to the emergency department for any worsening or worrisome symptoms.  Please keep legs elevated when at rest, use compression stockings daily please follow-up with your PCP for recheck in the next 3 to 5 days

## 2022-08-17 NOTE — ED Provider Notes (Signed)
[facility] Provider Note  CSN: [id number] Arrival date & time: 08/17/22 1655  Chief Complaint(s) Leg Swelling  HPI Tyler [name] is a 89 y.o. male with past medical history as below, significant for CAD, hard of hearing, CHF, AAA, hypertension who presents to the ED with complaint of difficulty urinating, leg swelling.  Patient reports ongoing dysuria over the past few days, urgency, increased frequency.  Increased left lower extremity and right lower extremity swelling, left more than right.  Orthopnea worse than his typical.  His daughter has been giving him Lasix as needed, restart his Lasix yesterday with improved urine output and improved swelling to his lower extremities.  No fevers, chills, nausea or vomiting.  No chest pain or dyspnea otherwise.  He has some suprapubic discomfort with his urinary symptoms, no hematuria  Past Medical History Past Medical History:  Diagnosis Date   AAA (abdominal aortic aneurysm) ([place])    a. 05/2014 s/p stent grafting [facility]).   Bronchitis    CAD (coronary artery disease)    a. 06/2013 VF Arrest/Cath: 3VD, EF 50%;  b. 06/2013 CABG x 4: LIMA->LAD, VG->PDA->RPL, VG->OM.   CVA (cerebral infarction)    Erectile dysfunction    Essential hypertension    GERD (gastroesophageal reflux disease)    H/O hiatal hernia    HOH (hard of hearing)    wears hearing aid in right ear   Hyperlipidemia    Osteoarthritis    Pneumonia    YRS AGO   Stroke [facility])    Patient Active Problem List   Diagnosis Date Noted   Benign neoplasm of ascending colon    Fecal occult blood test positive    GI bleed 02/25/2022   Gross hematuria 02/25/2022   Malignant neoplasm of bladder, unspecified ([place]) 02/25/2022   Symptomatic anemia 02/25/2022   ABLA (acute blood loss anemia) 02/24/2022   Physical deconditioning 03/25/2021   Transient ischemic attack (TIA) 03/25/2021   Transient ischemic attack 03/24/2021   Stenosis  of right carotid artery    Arthritis of left hip 09/01/2014   AAA (abdominal aortic aneurysm) ([facility])    Essential hypertension    CAD (coronary artery disease)    S/P CABG x 4 06/27/2013   Hyperlipidemia    Home Medication(s) Prior to Admission medications   Medication Sig Start Date End Date Taking? Authorizing Provider  cephALEXin (KEFLEX) 500 MG capsule Take 1 capsule (500 mg total) by mouth 3 (three) times daily for 10 days. 08/17/22 08/27/22 Yes Jeanell Sparrow, DO  phenazopyridine (PYRIDIUM) 200 MG tablet Take 1 tablet (200 mg total) by mouth 3 (three) times daily. 08/17/22  Yes Jeanell Sparrow, DO  aspirin EC 81 MG tablet Take 1 tablet (81 mg total) by mouth daily. Swallow whole. Hold if  overt external bleeding 02/27/22   Florencia Reasons, MD  atorvastatin (LIPITOR) 40 MG tablet Take 40 mg by mouth at bedtime.    [provider]  clopidogrel (PLAVIX) 75 MG tablet Take 1 tablet (75 mg total) by mouth daily. Hold plavix, resume when ok with urology Patient taking differently: Take 75 mg by mouth in the morning. 02/27/22   Florencia Reasons, MD  ferrous gluconate (FERGON) 324 MG tablet Take 324 mg by mouth daily with breakfast.    [provider]  furosemide (LASIX) 20 MG tablet Take 1 tablet (20 mg total) by mouth daily as needed (swelling). Patient taking differently: Take 20 mg by mouth daily as needed (for  swelling). 03/02/22   Belva Crome, MD  metoprolol tartrate (LOPRESSOR) 25 MG tablet Take 0.5 tablets (12.5 mg total) by mouth 2 (two) times daily. 08/29/13   Belva Crome, MD  nitroGLYCERIN (NITROSTAT) 0.4 MG SL tablet Place 1 tablet (0.4 mg total) under the tongue every 5 (five) minutes as needed for chest pain. 10/29/21   Belva Crome, MD  pantoprazole (PROTONIX) 40 MG tablet Take 1 tablet (40 mg total) by mouth daily. 04/05/22 05/05/22  Antonieta Pert, MD  terazosin (HYTRIN) 5 MG capsule Take 5 mg by mouth at bedtime.    [provider]                                                                                                                                     Past Surgical History Past Surgical History:  Procedure Laterality Date   COLONOSCOPY WITH PROPOFOL N/A 02/26/2022   Procedure: COLONOSCOPY WITH PROPOFOL;  Surgeon: Daryel November, MD;  Location: [facility];  Service: Gastroenterology;  Laterality: N/A;   CORONARY ARTERY BYPASS GRAFT N/A 06/27/2013   Procedure: CORONARY ARTERY BYPASS GRAFTING (CABG);  Surgeon: Ivin Poot, MD;  Location: [place];  Service: Open Heart Surgery;  Laterality: N/A;  times four utilizing the left internal mammary artery and the right greater saphenous vein harvested endoscopically   ENDOVEIN HARVEST OF GREATER SAPHENOUS VEIN Right 06/27/2013   Procedure: ENDOVEIN HARVEST OF GREATER SAPHENOUS VEIN;  Surgeon: Ivin Poot, MD;  Location: [place];  Service: Open Heart Surgery;  Laterality: Right;   ESOPHAGOGASTRODUODENOSCOPY (EGD) WITH PROPOFOL N/A 02/25/2022   Procedure: ESOPHAGOGASTRODUODENOSCOPY (EGD) WITH PROPOFOL;  Surgeon: Carol Ada, MD;  Location: [facility];  Service: Gastroenterology;  Laterality: N/A;   EYE SURGERY     bilateral cataracts   GIVENS CAPSULE STUDY N/A 02/26/2022   Procedure: GIVENS CAPSULE STUDY;  Surgeon: Daryel November, MD;  Location: [facility];  Service: Gastroenterology;  Laterality: N/A;   HEMOSTASIS CLIP PLACEMENT  02/26/2022   Procedure: HEMOSTASIS CLIP PLACEMENT;  Surgeon: Daryel November, MD;  Location: [facility];  Service: Gastroenterology;;   [facility] EAR SURGERY Left 1982   INNER EAR SURGERY     severed a nerve in his left ear.    LEFT HEART CATHETERIZATION WITH CORONARY ANGIOGRAM N/A 06/27/2013   Procedure: LEFT HEART CATHETERIZATION WITH CORONARY ANGIOGRAM;  Surgeon: Sinclair Grooms, MD;  Location: [facility] CATH LAB;  Service: Cardiovascular;  Laterality: N/A;  iabp insertion, cpr, intubation, emergent cvts   POLYPECTOMY  02/26/2022   Procedure: POLYPECTOMY;  Surgeon: Daryel November, MD;  Location: [facility];  Service: Gastroenterology;;   TEE WITHOUT CARDIOVERSION N/A 06/27/2013   Procedure: TRANSESOPHAGEAL ECHOCARDIOGRAM (TEE);  Surgeon: Ivin Poot, MD;  Location: [place];  Service: Open Heart Surgery;  Laterality: N/A;   TONSILLECTOMY     TOTAL HIP ARTHROPLASTY Left 09/01/2014   Procedure: TOTAL HIP ARTHROPLASTY;  Surgeon: Kerin Salen, MD;  Location: [place];  Service: Orthopedics;  Laterality: Left;   Family History Family History  Problem Relation Age of Onset   Heart disease Sister    Heart disease Brother    Heart attack Sister    Heart attack Brother    Colon cancer Neg Hx    Hypertension Neg Hx    Stroke Neg Hx     Social History Social History   Tobacco Use   Smoking status: Never   Smokeless tobacco: Never  Vaping Use   Vaping Use: Never used  Substance Use Topics   Alcohol use: Never    Comment: Rare   Drug use: No   Allergies Patient has no known allergies.  Review of Systems Review of Systems  Constitutional:  Negative for chills and fever.  HENT:  Negative for facial swelling and trouble swallowing.   Eyes:  Negative for photophobia and visual disturbance.  Respiratory:  Negative for cough and shortness of breath.   Cardiovascular:  Positive for leg swelling. Negative for chest pain and palpitations.  Gastrointestinal:  Negative for abdominal pain, nausea and vomiting.  Endocrine: Negative for polydipsia and polyuria.  Genitourinary:  Positive for dysuria and urgency. Negative for difficulty urinating and hematuria.  Musculoskeletal:  Negative for gait problem and joint swelling.  Skin:  Negative for pallor and rash.  Neurological:  Negative for syncope and headaches.  Psychiatric/Behavioral:  Negative for agitation and confusion.     Physical Exam Vital Signs  I have reviewed the triage vital signs BP (!) 158/99   Pulse 93   Temp 98.4 F (36.9 C) (Oral)   Resp 20   SpO2 97%  Physical Exam Vitals and nursing note  reviewed.  Constitutional:      General: He is not in acute distress.    Appearance: Normal appearance. He is well-developed. He is not ill-appearing.  HENT:     Head: Normocephalic and atraumatic.     Right Ear: External ear normal.     Left Ear: External ear normal.     Mouth/Throat:     Mouth: Mucous membranes are moist.  Eyes:     General: No scleral icterus. Cardiovascular:     Rate and Rhythm: Normal rate and regular rhythm.     Pulses: Normal pulses.     Heart sounds: Normal heart sounds.  Pulmonary:     Effort: Pulmonary effort is normal. No respiratory distress.     Breath sounds: Normal breath sounds.  Abdominal:     General: Abdomen is flat.     Palpations: Abdomen is soft.     Tenderness: There is abdominal tenderness.  Musculoskeletal:        General: Normal range of motion.     Cervical back: Normal range of motion.     Right lower leg: Edema present.     Left lower leg: Edema present.     Comments: Stasis dermatitis noted to bl le  Skin:    General: Skin is warm and dry.     Capillary Refill: Capillary refill takes less than 2 seconds.  Neurological:     Mental Status: He is alert and oriented to person, place, and time.     GCS: GCS eye subscore is 4. GCS verbal subscore is 5. GCS motor subscore is 6.  Psychiatric:        Mood and Affect: Mood normal.        Behavior: Behavior normal.  ED Results and Treatments Labs (all labs ordered are listed, but only abnormal results are displayed) Labs Reviewed  CBC WITH DIFFERENTIAL/PLATELET - Abnormal; Notable for the following components:      Result Value   RBC 3.94 (*)    Hemoglobin 11.5 (*)    HCT 36.7 (*)    RDW 17.1 (*)    All other components within normal limits  BRAIN NATRIURETIC PEPTIDE - Abnormal; Notable for the following components:   B Natriuretic Peptide 354.3 (*)    All other components within normal limits  COMPREHENSIVE METABOLIC PANEL - Abnormal; Notable for the following components:    Creatinine, Ser 1.26 (*)    Calcium 8.8 (*)    GFR, Estimated 54 (*)    All other components within normal limits  URINALYSIS, ROUTINE W REFLEX MICROSCOPIC - Abnormal; Notable for the following components:   APPearance HAZY (*)    Hgb urine dipstick SMALL (*)    Leukocytes,Ua LARGE (*)    WBC, UA >50 (*)    Bacteria, UA FEW (*)    All other components within normal limits  URINE CULTURE  T4, FREE  TROPONIN I (HIGH SENSITIVITY)  TROPONIN I (HIGH SENSITIVITY)                                                                                                                          Radiology CT Angio Chest PE W and/or Wo Contrast  Result Date: 08/17/2022 CLINICAL DATA:  Edema.  High probability for PE. EXAM: CT ANGIOGRAPHY CHEST WITH CONTRAST TECHNIQUE: Multidetector CT imaging of the chest was performed using the standard protocol during bolus administration of intravenous contrast. Multiplanar CT image reconstructions and MIPs were obtained to evaluate the vascular anatomy. RADIATION DOSE REDUCTION: This exam was performed according to the departmental dose-optimization program which includes automated exposure control, adjustment of the mA and/or kV according to patient size and/or use of iterative reconstruction technique. CONTRAST:  67m OMNIPAQUE IOHEXOL 350 MG/ML SOLN COMPARISON:  None Available. FINDINGS: Cardiovascular: Satisfactory opacification of the pulmonary arteries to the segmental level. No evidence of pulmonary embolism. Normal heart size. No pericardial effusion. There are atherosclerotic calcifications of the aorta and coronary arteries. Mediastinum/Nodes: No enlarged mediastinal, hilar, or axillary lymph nodes. Thyroid gland, trachea, and esophagus demonstrate no significant findings. Lungs/Pleura: There are peripheral reticular opacities throughout both lungs. There is a nodular density in the right upper lobe measuring 9 mm. There are trace bilateral pleural effusions, left  greater than right. There are minimal ground-glass opacities in the lung bases. Upper Abdomen: Aortic graft is partially visualized. No acute findings. Musculoskeletal: Degenerative changes affect the spine. Sternotomy wires are present. Review of the MIP images confirms the above findings. IMPRESSION: 1. No evidence for pulmonary embolism. 2. Trace bilateral pleural effusions, left greater than right. 3. Minimal ground-glass opacities in the lung bases may represent edema or infection. 4. Peripheral reticular opacities throughout both lungs worrisome for interstitial lung disease. 5. 9 mm right  solid pulmonary nodule within the upper lobe. Per Fleischner Society Guidelines, consider a non-contrast Chest CT at 3 months, a PET/CT, or tissue sampling. These guidelines do not apply to immunocompromised patients and patients with cancer. Follow up in patients with significant comorbidities as clinically warranted. For lung cancer screening, adhere to Lung-RADS guidelines. Reference: Radiology. 2017; 284(1):228-43. Aortic Atherosclerosis (ICD10-I70.0). Electronically Signed   By: Ronney Asters M.D.   On: 08/17/2022 22:52   DG Chest Port 1 View  Result Date: 08/17/2022 CLINICAL DATA:  CHF EXAM: PORTABLE CHEST 1 VIEW COMPARISON:  Chest x-ray 03/25/2021 FINDINGS: Limited evaluation of the left lung base secondary to technique. The cardiomediastinal silhouette is stable, the heart is enlarged. Patient is status post cardiac surgery. The visualized portions of the lungs appear clear. There is no pneumothorax or acute fracture. IMPRESSION: Limited evaluation of the left lung base secondary to technique. No definite acute cardiopulmonary process. Electronically Signed   By: Ronney Asters M.D.   On: 08/17/2022 18:09    Pertinent labs & imaging results that were available during my care of the patient were reviewed by me and considered in my medical decision making (see MDM for details).  Medications Ordered in  ED Medications  cefTRIAXone (ROCEPHIN) 1 g in sodium chloride 0.9 % 100 mL IVPB (0 g Intravenous Stopped 08/18/22 0010)  iohexol (OMNIPAQUE) 350 MG/ML injection 75 mL (75 mLs Intravenous Contrast Given 08/17/22 2222)                                                                                                                                     Procedures Procedures  (including critical care time)  Medical Decision Making / ED Course   MDM:  Byard Carranza is a 89 y.o. male with past medical history as below, significant for CAD, hard of hearing, CHF, AAA, hypertension who presents to the ED with complaint of difficulty urinating, leg swelling.. The complaint involves an extensive differential diagnosis and also carries with it a high risk of complications and morbidity.  Serious etiology was considered. Ddx includes but is not limited to: Differential diagnosis includes but is not exclusive to acute appendicitis, renal colic, testicular torsion, urinary tract infection, prostatitis,  diverticulitis, small bowel obstruction, colitis, abdominal aortic aneurysm, gastroenteritis, constipation, UTI, stasis dermatitis, less likely cellulitis etc.   On initial assessment the patient is: NAD, on ambient air, comfortbale Vital signs and nursing notes were reviewed    Patient with lower extremity edema and orthopnea.  History of CHF.  Variable compliance with Lasix.  BNP is mildly elevated, he has small pleural effusions on his CT imaging.  He is not hypoxic.  Troponin is not elevated.  Sodium normal.  Does not appear to be in overt fluid overload or severe CHF exacerbation.  Also is apparent UTI, urine culture sent.  Given dose of Rocephin in the ED.  Labs otherwise are stable.  Patient with UTI  and likely mild CHF exacerbation.  Start oral antibiotics for home and Pyridium.  Discussed restarting Lasix at home and follow-up with PCP for recheck in the next 3 days.  Low suspicion for cellulitis  lower extremities, favor stasis dermatitis.  Recommend compression stockings, elevate legs.  Follow-up with PCP  Advise follow-up PCP for repeat imaging in 3 months in regards to pulmonary nodule  Findings and care plan discussed at length with daughter at bedside who is primary caretaker.  The patient improved significantly and was discharged in stable condition. Detailed discussions were had with the patient regarding current findings, and need for close f/u with PCP or on call doctor. The patient has been instructed to return immediately if the symptoms worsen in any way for re-evaluation. Patient verbalized understanding and is in agreement with current care plan. All questions answered prior to discharge.    Additional history obtained: -Additional history obtained from family -External records from outside source obtained and reviewed including: Chart review including previous notes, labs, imaging, consultation notes including home medications, prior labs and imaging, prior ED visits   Lab Tests: -I ordered, reviewed, and interpreted labs.   The pertinent results include:   Labs Reviewed  CBC WITH DIFFERENTIAL/PLATELET - Abnormal; Notable for the following components:      Result Value   RBC 3.94 (*)    Hemoglobin 11.5 (*)    HCT 36.7 (*)    RDW 17.1 (*)    All other components within normal limits  BRAIN NATRIURETIC PEPTIDE - Abnormal; Notable for the following components:   B Natriuretic Peptide 354.3 (*)    All other components within normal limits  COMPREHENSIVE METABOLIC PANEL - Abnormal; Notable for the following components:   Creatinine, Ser 1.26 (*)    Calcium 8.8 (*)    GFR, Estimated 54 (*)    All other components within normal limits  URINALYSIS, ROUTINE W REFLEX MICROSCOPIC - Abnormal; Notable for the following components:   APPearance HAZY (*)    Hgb urine dipstick SMALL (*)    Leukocytes,Ua LARGE (*)    WBC, UA >50 (*)    Bacteria, UA FEW (*)    All other  components within normal limits  URINE CULTURE  T4, FREE  TROPONIN I (HIGH SENSITIVITY)  TROPONIN I (HIGH SENSITIVITY)    Notable for as above  EKG   EKG Interpretation  Date/Time:  Wednesday August 17 2022 20:49:10 EST Ventricular Rate:  120 PR Interval:  188 QRS Duration: 139 QT Interval:  399 QTC Calculation: 564 R Axis:   71 Text Interpretation: Sinus tachycardia LAE, consider biatrial enlargement Nonspecific intraventricular conduction delay Anterior infarct, old Confirmed by Wynona Dove ([id number]) on 08/17/2022 8:59:30 PM         Imaging Studies ordered: I ordered imaging studies including CTPE CXR I independently visualized the following imaging with scope of interpretation limited to determining acute life threatening conditions related to emergency care: Trace pleural effusions, pulmonary nodules I independently visualized and interpreted imaging. I agree with the radiologist interpretation   Medicines ordered and prescription drug management: Meds ordered this encounter  Medications   cefTRIAXone (ROCEPHIN) 1 g in sodium chloride 0.9 % 100 mL IVPB    Order Specific Question:   Antibiotic Indication:    Answer:   UTI   iohexol (OMNIPAQUE) 350 MG/ML injection 75 mL   cephALEXin (KEFLEX) 500 MG capsule    Sig: Take 1 capsule (500 mg total) by mouth 3 (three) times daily for 10 days.  Dispense:  30 capsule    Refill:  0   phenazopyridine (PYRIDIUM) 200 MG tablet    Sig: Take 1 tablet (200 mg total) by mouth 3 (three) times daily.    Dispense:  6 tablet    Refill:  0    -I have reviewed the patients home medicines and have made adjustments as needed   Consultations Obtained: Not applicable  Cardiac Monitoring: The patient was maintained on a cardiac monitor.  I personally viewed and interpreted the cardiac monitored which showed an underlying rhythm of: NSR  Social Determinants of Health:  Diagnosis or treatment significantly limited by social determinants  of health: hard of hearing   Reevaluation: After the interventions noted above, I reevaluated the patient and found that they have improved  Co morbidities that complicate the patient evaluation  Past Medical History:  Diagnosis Date   AAA (abdominal aortic aneurysm) ([place])    a. 05/2014 s/p stent grafting [facility]).   Bronchitis    CAD (coronary artery disease)    a. 06/2013 VF Arrest/Cath: 3VD, EF 50%;  b. 06/2013 CABG x 4: LIMA->LAD, VG->PDA->RPL, VG->OM.   CVA (cerebral infarction)    Erectile dysfunction    Essential hypertension    GERD (gastroesophageal reflux disease)    H/O hiatal hernia    HOH (hard of hearing)    wears hearing aid in right ear   Hyperlipidemia    Osteoarthritis    Pneumonia    YRS AGO   Stroke [facility])       Dispostion: Disposition decision including need for hospitalization was considered, and patient discharged from emergency department.    Final Clinical Impression(s) / ED Diagnoses Final diagnoses:  Urinary tract infection without hematuria, site unspecified  Acute on chronic congestive heart failure, unspecified heart failure type [facility])  Pulmonary nodule     This chart was dictated using voice recognition software.  Despite best efforts to proofread,  errors can occur which can change the documentation meaning.    Jeanell Sparrow, DO 08/18/22 [phone]

## 2022-08-19 LAB — URINE CULTURE: Culture: >=100000 — AB

## 2022-08-20 ENCOUNTER — Telehealth (HOSPITAL_BASED_OUTPATIENT_CLINIC_OR_DEPARTMENT_OTHER): Payer: Self-pay | Admitting: *Deleted

## 2022-08-20 NOTE — Telephone Encounter (Signed)
Post ED Visit - Positive Culture Follow-up  Culture report reviewed by antimicrobial stewardship pharmacist: [place] Team '[]'$  Elenor Quinones, Pharm.D. '[]'$  Heide Guile, Pharm.D., BCPS AQ-ID '[]'$  Parks Neptune, Pharm.D., BCPS '[]'$  Alycia Rossetti, Pharm.D., BCPS '[]'$  [place], Pharm.D., BCPS, AAHIVP '[]'$  Legrand Como, Pharm.D., BCPS, AAHIVP '[]'$  Salome Arnt, PharmD, BCPS '[]'$  Johnnette Gourd, PharmD, BCPS '[]'$  Hughes Better, PharmD, BCPS '[]'$  Leeroy Cha, PharmD '[]'$  Laqueta Linden, PharmD, BCPS '[x]'$  Franchot Gallo, PharmD  [place] Team '[]'$  Leodis Sias, PharmD '[]'$  Lindell Spar, PharmD '[]'$  Royetta Asal, PharmD '[]'$  Graylin Shiver, Rph '[]'$  Rema Fendt) Glennon Mac, PharmD '[]'$  Arlyn Dunning, PharmD '[]'$  Netta Cedars, PharmD '[]'$  Dia Sitter, PharmD '[]'$  Leone Haven, PharmD '[]'$  Gretta Arab, PharmD '[]'$  Theodis Shove, PharmD '[]'$  Peggyann Juba, PharmD '[]'$  Reuel Boom, PharmD   Positive urine culture Treated with Cephalexin, organism sensitive to the same and no further patient follow-up is required at this time.  Tyler [name] 08/20/2022, 10:41 AM

## 2022-12-18 ENCOUNTER — Encounter (HOSPITAL_BASED_OUTPATIENT_CLINIC_OR_DEPARTMENT_OTHER): Payer: Self-pay | Admitting: Emergency Medicine

## 2022-12-18 ENCOUNTER — Other Ambulatory Visit: Payer: Self-pay

## 2022-12-18 ENCOUNTER — Emergency Department (HOSPITAL_BASED_OUTPATIENT_CLINIC_OR_DEPARTMENT_OTHER)
Admission: EM | Admit: 2022-12-18 | Discharge: 2022-12-18 | Disposition: A | Discharge status: Home / Self Care | Payer: No Typology Code available for payment source | Attending: Emergency Medicine | Admitting: Emergency Medicine

## 2022-12-18 DIAGNOSIS — Z7982 Long term (current) use of aspirin: Secondary | ICD-10-CM | POA: Diagnosis not present

## 2022-12-18 DIAGNOSIS — I2510 Atherosclerotic heart disease of native coronary artery without angina pectoris: Secondary | ICD-10-CM | POA: Diagnosis not present

## 2022-12-18 DIAGNOSIS — Z7901 Long term (current) use of anticoagulants: Secondary | ICD-10-CM | POA: Insufficient documentation

## 2022-12-18 DIAGNOSIS — R21 Rash and other nonspecific skin eruption: Secondary | ICD-10-CM

## 2022-12-18 DIAGNOSIS — Z951 Presence of aortocoronary bypass graft: Secondary | ICD-10-CM | POA: Insufficient documentation

## 2022-12-18 LAB — CBC
HCT: 37.6 % — ABNORMAL LOW (ref 39.0–52.0)
Hemoglobin: 12.5 g/dL — ABNORMAL LOW (ref 13.0–17.0)
MCH: 30.6 pg (ref 26.0–34.0)
MCHC: 33.2 g/dL (ref 30.0–36.0)
MCV: 92.2 fL (ref 80.0–100.0)
Platelets: 136 10*3/uL — ABNORMAL LOW (ref 150–400)
RBC: 4.08 MIL/uL — ABNORMAL LOW (ref 4.22–5.81)
RDW: 14.9 % (ref 11.5–15.5)
WBC: 7.4 10*3/uL (ref 4.0–10.5)
nRBC: 0 % (ref 0.0–0.2)

## 2022-12-18 LAB — URINALYSIS, ROUTINE W REFLEX MICROSCOPIC
Bilirubin Urine: NEGATIVE
Glucose, UA: NEGATIVE mg/dL
Ketones, ur: NEGATIVE mg/dL
Nitrite: NEGATIVE
Specific Gravity, Urine: 1.019 (ref 1.005–1.030)
WBC, UA: >50 WBC/hpf (ref 0–5)
pH: 6 (ref 5.0–8.0)

## 2022-12-18 LAB — DIFFERENTIAL
Abs Immature Granulocytes: 0.03 10*3/uL (ref 0.00–0.07)
Basophils Absolute: 0.1 10*3/uL (ref 0.0–0.1)
Basophils Relative: 1 %
Eosinophils Absolute: 0.1 10*3/uL (ref 0.0–0.5)
Eosinophils Relative: 2 %
Immature Granulocytes: 0 %
Lymphocytes Relative: 24 %
Lymphs Abs: 1.8 10*3/uL (ref 0.7–4.0)
Monocytes Absolute: 0.7 10*3/uL (ref 0.1–1.0)
Monocytes Relative: 9 %
Neutro Abs: 4.9 10*3/uL (ref 1.7–7.7)
Neutrophils Relative %: 64 %

## 2022-12-18 LAB — COMPREHENSIVE METABOLIC PANEL
ALT: 10 U/L (ref 0–44)
AST: 15 U/L (ref 15–41)
Albumin: 4.2 g/dL (ref 3.5–5.0)
Alkaline Phosphatase: 59 U/L (ref 38–126)
Anion gap: 9 (ref 5–15)
BUN: 25 mg/dL — ABNORMAL HIGH (ref 8–23)
CO2: 25 mmol/L (ref 22–32)
Calcium: 8.7 mg/dL — ABNORMAL LOW (ref 8.9–10.3)
Chloride: 105 mmol/L (ref 98–111)
Creatinine, Ser: 1.91 mg/dL — ABNORMAL HIGH (ref 0.61–1.24)
GFR, Estimated: 33 mL/min — ABNORMAL LOW (ref >60)
Glucose, Bld: 93 mg/dL (ref 70–99)
Potassium: 4.4 mmol/L (ref 3.5–5.1)
Sodium: 139 mmol/L (ref 135–145)
Total Bilirubin: 0.5 mg/dL (ref 0.3–1.2)
Total Protein: 6.9 g/dL (ref 6.5–8.1)

## 2022-12-18 MED ORDER — TRIAMCINOLONE ACETONIDE 0.5 % EX CREA: TOPICAL_CREAM | Freq: Once | CUTANEOUS | Status: DC

## 2022-12-18 MED ORDER — FAMOTIDINE 20 MG PO TABS
10.0000 mg | ORAL_TABLET | Freq: Once | ORAL | Status: AC
  Administered 2022-12-18: 10 mg via ORAL
  Filled 2022-12-18: qty 1

## 2022-12-18 MED ORDER — SODIUM CHLORIDE 0.9 % IV BOLUS
500.0000 mL | Freq: Once | INTRAVENOUS | Status: AC
  Administered 2022-12-18: 500 mL via INTRAVENOUS

## 2022-12-18 MED ORDER — DIPHENHYDRAMINE HCL 25 MG PO CAPS: 25.0000 mg | ORAL_CAPSULE | Freq: Once | ORAL | Status: DC

## 2022-12-18 MED ORDER — PREDNISONE 50 MG PO TABS: 60.0000 mg | ORAL_TABLET | Freq: Once | ORAL | Status: DC

## 2022-12-18 MED ORDER — CEPHALEXIN 500 MG PO CAPS: 1000.0000 mg | ORAL_CAPSULE | Freq: Two times a day (BID) | ORAL | 0 refills | Status: DC

## 2022-12-18 MED ORDER — FAMOTIDINE 20 MG PO TABS: 20.0000 mg | ORAL_TABLET | Freq: Once | ORAL | Status: DC

## 2022-12-18 MED ORDER — TRIAMCINOLONE ACETONIDE 0.1 % EX CREA: 1.0000 | TOPICAL_CREAM | Freq: Two times a day (BID) | CUTANEOUS | 0 refills | Status: AC

## 2022-12-18 MED ORDER — CEPHALEXIN 250 MG PO CAPS
1000.0000 mg | ORAL_CAPSULE | Freq: Once | ORAL | Status: AC
  Administered 2022-12-18: 1000 mg via ORAL
  Filled 2022-12-18: qty 4

## 2022-12-18 NOTE — ED Notes (Signed)
Called lab to add differential.

## 2022-12-18 NOTE — ED Notes (Signed)
RN reviewed discharge instructions with pt. Pt verbalized understanding and had no further questions. VSS upon discharge.  

## 2022-12-18 NOTE — ED Triage Notes (Signed)
Red patches of skin to torso and lower extremities. Hurts to touch. Started yesterday. Pt has UTI , just started sulfamethoxazole on Friday.

## 2022-12-18 NOTE — ED Provider Notes (Signed)
[place] EMERGENCY DEPARTMENT AT [facility] Provider Note   CSN: [id number] Arrival date & time: 12/18/22  1407     History  No chief complaint on file.  HPI Tyler [name] is a 89 y.o. male with history of CAD status post CABG x 4, AAA, TIA presenting for rash.  Noticed the rash yesterday.  Characterizes it as red splotches all over his body.  They are at times painful to touch but otherwise not itchy or painful.  Not oozing or bleeding.  Patient mentioned that he was started on Bactrim for UTI on Friday.  Denies any other recent exposures to lotions, detergents, new clothes or new foods.  Denies any trouble breathing, chest pain or abdominal pain.  Denies nausea vomiting diarrhea.  HPI     Home Medications Prior to Admission medications   Medication Sig Start Date End Date Taking? Authorizing Provider  cephALEXin (KEFLEX) 500 MG capsule Take 2 capsules (1,000 mg total) by mouth 2 (two) times daily. 12/18/22  Yes Gareth Eagle, PA-C  triamcinolone cream (KENALOG) 0.1 % Apply 1 Application topically 2 (two) times daily. 12/18/22  Yes Gareth Eagle, PA-C  aspirin EC 81 MG tablet Take 1 tablet (81 mg total) by mouth daily. Swallow whole. Hold if  overt external bleeding 02/27/22   Albertine Grates, MD  atorvastatin (LIPITOR) 40 MG tablet Take 40 mg by mouth at bedtime.    [provider]  clopidogrel (PLAVIX) 75 MG tablet Take 1 tablet (75 mg total) by mouth daily. Hold plavix, resume when ok with urology Patient taking differently: Take 75 mg by mouth in the morning. 02/27/22   Albertine Grates, MD  ferrous gluconate (FERGON) 324 MG tablet Take 324 mg by mouth daily with breakfast.    [provider]  furosemide (LASIX) 20 MG tablet Take 1 tablet (20 mg total) by mouth daily as needed (swelling). Patient taking differently: Take 20 mg by mouth daily as needed (for swelling). 03/02/22   Lyn Records, MD  metoprolol tartrate (LOPRESSOR) 25 MG tablet Take 0.5 tablets (12.5  mg total) by mouth 2 (two) times daily. 08/29/13   Lyn Records, MD  nitroGLYCERIN (NITROSTAT) 0.4 MG SL tablet Place 1 tablet (0.4 mg total) under the tongue every 5 (five) minutes as needed for chest pain. 10/29/21   Lyn Records, MD  pantoprazole (PROTONIX) 40 MG tablet Take 1 tablet (40 mg total) by mouth daily. 04/05/22 05/05/22  Lanae Boast, MD  phenazopyridine (PYRIDIUM) 200 MG tablet Take 1 tablet (200 mg total) by mouth 3 (three) times daily. 08/17/22   Sloan Leiter, DO  terazosin (HYTRIN) 5 MG capsule Take 5 mg by mouth at bedtime.    [provider]      Allergies    Bactrim [sulfamethoxazole-trimethoprim]    Review of Systems   See HPI for pertinent positives   Physical Exam   Vitals:   12/18/22 1420 12/18/22 1520  BP: (!) 92/57 107/60  Pulse: 88 81  Resp: 20 18  Temp: 98.5 F (36.9 C)   SpO2: 95% 96%    CONSTITUTIONAL:  well-appearing, NAD NEURO:  Alert and oriented x 3, CN 3-12 grossly intact EYES:  eyes equal and reactive ENT/NECK:  Supple, no stridor  CARDIO:  Regular rate and rhythm, appears well-perfused  PULM:  No respiratory distress, CTAB GI/GU:  non-distended, soft MSK/SPINE:  No gross deformities, no edema, moves all extremities  SKIN: Diffuse widespread red, macular rash, negative Nikolsky sign  *  Additional and/or pertinent findings included in MDM below   ED Results / Procedures / Treatments   Labs (all labs ordered are listed, but only abnormal results are displayed) Labs Reviewed  URINALYSIS, ROUTINE W REFLEX MICROSCOPIC - Abnormal; Notable for the following components:      Result Value   APPearance HAZY (*)    Hgb urine dipstick TRACE (*)    Protein, ur TRACE (*)    Leukocytes,Ua LARGE (*)    Bacteria, UA RARE (*)    All other components within normal limits  CBC - Abnormal; Notable for the following components:   RBC 4.08 (*)    Hemoglobin 12.5 (*)    HCT 37.6 (*)    Platelets 136 (*)    All other components within normal  limits  COMPREHENSIVE METABOLIC PANEL - Abnormal; Notable for the following components:   BUN 25 (*)    Creatinine, Ser 1.91 (*)    Calcium 8.7 (*)    GFR, Estimated 33 (*)    All other components within normal limits  URINE CULTURE  DIFFERENTIAL    EKG None  Radiology No results found.  Procedures Procedures    Medications Ordered in ED Medications  sodium chloride 0.9 % bolus 500 mL (0 mLs Intravenous Stopped 12/18/22 1651)  famotidine (PEPCID) tablet 10 mg (10 mg Oral Given 12/18/22 1703)  cephALEXin (KEFLEX) capsule 1,000 mg (1,000 mg Oral Given 12/18/22 1704)  sodium chloride 0.9 % bolus 500 mL (500 mLs Intravenous New Bag/Given 12/18/22 1704)    ED Course/ Medical Decision Making/ A&P                             Medical Decision Making Amount and/or Complexity of Data Reviewed Labs: ordered.  Risk Prescription drug management.   89 year old male who is well-appearing presenting for rash.  Exam notable for red widespread macular rash.  DDx includes urticaria, contact dermatitis, drug-induced rash, SJS, TEN.  Suspect drug-induced rash as patient started Bactrim for UTI on Friday and rash presented 2 days later.  Symptoms and appearance of rash not consistent with SJS or TE N.  Advised patient to discontinue his Bactrim.  Started him on Keflex.  Treating rash with triamcinolone which was sent to his pharmacy.  Labs reassuring but did reveal some evidence of mild AKI.  Volume resuscitated with 1 L of fluids.  Advised to follow-up with his PCP for his rash and recheck of his labs.  Discussed pertinent return precautions.  Vital stable at discharge.  Discharged home.        Final Clinical Impression(s) / ED Diagnoses Final diagnoses:  None    Rx / DC Orders ED Discharge Orders          Ordered    cephALEXin (KEFLEX) 500 MG capsule  2 times daily        12/18/22 1643    triamcinolone cream (KENALOG) 0.1 %  2 times daily        12/18/22 1700               Gareth Eagle, PA-C 12/18/22 1741    Elayne Snare K, DO 12/18/22 2310

## 2022-12-18 NOTE — Discharge Instructions (Signed)
Evaluation for rash was likely that it was drug-induced by Bactrim the medication you are on for UTI.  Recommend that you discontinue this medication and continue to take Keflex for UTI.  You can also apply triamcinolone cream to the rash which is a topical steroid for treatment.  If the rash gets worse if you start to notice blisters or sloughing of the skin, start to have any changes in your mouth like redness or soreness, develop a fever or have any other concerning symptom please return emergency department further evaluation.  Otherwise recommend that you follow-up with your PCP for rash reevaluation and recheck of your labs as they did show evidence of a mild AKI for this encounter.

## 2022-12-20 LAB — URINE CULTURE: Culture: >=100000 — AB

## 2022-12-21 ENCOUNTER — Telehealth (HOSPITAL_BASED_OUTPATIENT_CLINIC_OR_DEPARTMENT_OTHER): Payer: Self-pay | Admitting: *Deleted

## 2022-12-21 NOTE — Progress Notes (Signed)
ED Antimicrobial Stewardship Positive Culture Follow Up   Tyler [name] is an 89 y.o. male who presented to [facility] on 12/18/2022 with a chief complaint of No chief complaint on file.   Recent Results (from the past 720 hour(s))  Urine Culture     Status: Abnormal   Collection Time: 12/18/22  4:02 PM   Specimen: Urine, Clean Catch  Result Value Ref Range Status   Specimen Description   Final    URINE, CLEAN CATCH Performed at [facility], [address], [place], [place] [address]    Special Requests   Final    NONE Performed at [facility], [address] [place], [place] [address]    Culture >=100,000 COLONIES/mL ENTEROCOCCUS FAECALIS (A)  Final   Report Status 12/20/2022 FINAL  Final   Organism ID, Bacteria ENTEROCOCCUS FAECALIS (A)  Final      Susceptibility   Enterococcus faecalis - MIC*    AMPICILLIN <=2 SENSITIVE Sensitive     NITROFURANTOIN <=16 SENSITIVE Sensitive     VANCOMYCIN 1 SENSITIVE Sensitive     * >=100,000 COLONIES/mL ENTEROCOCCUS FAECALIS    [x]  Treated with cephalexin, organism resistant to prescribed antimicrobial  New antibiotic prescription: STOP cephalexin, START amoxicillin 500mg  by mouth twice daily x 10 days  ED Provider: Claudette Stapler, PA-C   Doristine Counter, PharmD, BCPS 12/21/2022, 7:37 AM Clinical Pharmacist Monday - Friday phone -  [phone] Saturday - Sunday phone - [phone]

## 2022-12-21 NOTE — Telephone Encounter (Signed)
Post ED Visit - Positive Culture Follow-up: Successful Patient Follow-Up  Culture assessed and recommendations reviewed by:  []  Enzo Bi, Pharm.D. []  Celedonio Miyamoto, Pharm.D., BCPS AQ-ID []  Garvin Fila, Pharm.D., BCPS []  Georgina Pillion, [address].D., BCPS []  [place], [address].D., BCPS, AAHIVP []  Estella Husk, Pharm.D., BCPS, AAHIVP []  Lysle Pearl, PharmD, BCPS []  Phillips Climes, PharmD, BCPS []  Agapito Games, PharmD, BCPS []  Verlan Friends, PharmD  Positive urine culture  []  Patient discharged without antimicrobial prescription and treatment is now indicated [x]  Organism is resistant to prescribed ED discharge antimicrobial []  Patient with positive blood cultures  Changes discussed with ED provider: Claudette Stapler, PA New antibiotic prescription Amoxicillin 500mg  PO Q12 hrs x 10 days, Stop Keflex Called to [facility] [phone]  Contacted patient, date 12/21/22, time 1400   Lysle Pearl 12/21/2022, 1:57 PM

## 2023-10-19 ENCOUNTER — Other Ambulatory Visit: Payer: Self-pay

## 2023-10-19 ENCOUNTER — Emergency Department (HOSPITAL_COMMUNITY)

## 2023-10-19 ENCOUNTER — Inpatient Hospital Stay (HOSPITAL_COMMUNITY)
Admission: EM | Admit: 2023-10-19 | Discharge: 2023-10-23 | DRG: 690 | Disposition: A | Discharge status: Home Health | Attending: Family Medicine | Admitting: Family Medicine

## 2023-10-19 DIAGNOSIS — I358 Other nonrheumatic aortic valve disorders: Secondary | ICD-10-CM | POA: Diagnosis present

## 2023-10-19 DIAGNOSIS — K219 Gastro-esophageal reflux disease without esophagitis: Secondary | ICD-10-CM | POA: Diagnosis present

## 2023-10-19 DIAGNOSIS — Z7902 Long term (current) use of antithrombotics/antiplatelets: Secondary | ICD-10-CM

## 2023-10-19 DIAGNOSIS — Z974 Presence of external hearing-aid: Secondary | ICD-10-CM

## 2023-10-19 DIAGNOSIS — I130 Hypertensive heart and chronic kidney disease with heart failure and stage 1 through stage 4 chronic kidney disease, or unspecified chronic kidney disease: Secondary | ICD-10-CM | POA: Diagnosis present

## 2023-10-19 DIAGNOSIS — Z7982 Long term (current) use of aspirin: Secondary | ICD-10-CM

## 2023-10-19 DIAGNOSIS — Z8673 Personal history of transient ischemic attack (TIA), and cerebral infarction without residual deficits: Secondary | ICD-10-CM

## 2023-10-19 DIAGNOSIS — Z881 Allergy status to other antibiotic agents status: Secondary | ICD-10-CM

## 2023-10-19 DIAGNOSIS — Z882 Allergy status to sulfonamides status: Secondary | ICD-10-CM

## 2023-10-19 DIAGNOSIS — Z8674 Personal history of sudden cardiac arrest: Secondary | ICD-10-CM

## 2023-10-19 DIAGNOSIS — Z7189 Other specified counseling: Secondary | ICD-10-CM

## 2023-10-19 DIAGNOSIS — Z1623 Resistance to quinolones and fluoroquinolones: Secondary | ICD-10-CM | POA: Diagnosis present

## 2023-10-19 DIAGNOSIS — E66812 Obesity, class 2: Secondary | ICD-10-CM | POA: Diagnosis present

## 2023-10-19 DIAGNOSIS — Z951 Presence of aortocoronary bypass graft: Secondary | ICD-10-CM

## 2023-10-19 DIAGNOSIS — I10 Essential (primary) hypertension: Secondary | ICD-10-CM | POA: Diagnosis present

## 2023-10-19 DIAGNOSIS — Z96642 Presence of left artificial hip joint: Secondary | ICD-10-CM | POA: Diagnosis present

## 2023-10-19 DIAGNOSIS — E785 Hyperlipidemia, unspecified: Secondary | ICD-10-CM | POA: Diagnosis present

## 2023-10-19 DIAGNOSIS — N12 Tubulo-interstitial nephritis, not specified as acute or chronic: Principal | ICD-10-CM | POA: Diagnosis present

## 2023-10-19 DIAGNOSIS — N400 Enlarged prostate without lower urinary tract symptoms: Secondary | ICD-10-CM | POA: Diagnosis present

## 2023-10-19 DIAGNOSIS — I5032 Chronic diastolic (congestive) heart failure: Secondary | ICD-10-CM | POA: Diagnosis present

## 2023-10-19 DIAGNOSIS — Z87440 Personal history of urinary (tract) infections: Secondary | ICD-10-CM

## 2023-10-19 DIAGNOSIS — E538 Deficiency of other specified B group vitamins: Secondary | ICD-10-CM | POA: Diagnosis present

## 2023-10-19 DIAGNOSIS — Z9221 Personal history of antineoplastic chemotherapy: Secondary | ICD-10-CM

## 2023-10-19 DIAGNOSIS — Z6835 Body mass index (BMI) 35.0-35.9, adult: Secondary | ICD-10-CM

## 2023-10-19 DIAGNOSIS — I2510 Atherosclerotic heart disease of native coronary artery without angina pectoris: Secondary | ICD-10-CM | POA: Diagnosis present

## 2023-10-19 DIAGNOSIS — Z79899 Other long term (current) drug therapy: Secondary | ICD-10-CM

## 2023-10-19 DIAGNOSIS — N1832 Chronic kidney disease, stage 3b: Secondary | ICD-10-CM | POA: Diagnosis present

## 2023-10-19 DIAGNOSIS — N390 Urinary tract infection, site not specified: Secondary | ICD-10-CM | POA: Diagnosis not present

## 2023-10-19 DIAGNOSIS — Z993 Dependence on wheelchair: Secondary | ICD-10-CM

## 2023-10-19 DIAGNOSIS — N183 Chronic kidney disease, stage 3 unspecified: Secondary | ICD-10-CM | POA: Diagnosis present

## 2023-10-19 DIAGNOSIS — C679 Malignant neoplasm of bladder, unspecified: Secondary | ICD-10-CM | POA: Diagnosis not present

## 2023-10-19 DIAGNOSIS — B965 Pseudomonas (aeruginosa) (mallei) (pseudomallei) as the cause of diseases classified elsewhere: Secondary | ICD-10-CM | POA: Diagnosis present

## 2023-10-19 DIAGNOSIS — Z8679 Personal history of other diseases of the circulatory system: Secondary | ICD-10-CM

## 2023-10-19 DIAGNOSIS — N3001 Acute cystitis with hematuria: Secondary | ICD-10-CM | POA: Diagnosis not present

## 2023-10-19 DIAGNOSIS — Z8249 Family history of ischemic heart disease and other diseases of the circulatory system: Secondary | ICD-10-CM

## 2023-10-19 DIAGNOSIS — D509 Iron deficiency anemia, unspecified: Secondary | ICD-10-CM | POA: Diagnosis present

## 2023-10-19 DIAGNOSIS — I714 Abdominal aortic aneurysm, without rupture, unspecified: Secondary | ICD-10-CM | POA: Diagnosis present

## 2023-10-19 LAB — URINALYSIS, ROUTINE W REFLEX MICROSCOPIC
Bilirubin Urine: NEGATIVE
Glucose, UA: NEGATIVE mg/dL
Ketones, ur: NEGATIVE mg/dL
Nitrite: POSITIVE — AB
Protein, ur: NEGATIVE mg/dL
Specific Gravity, Urine: 1.014 (ref 1.005–1.030)
WBC, UA: >50 WBC/hpf (ref 0–5)
pH: 5 (ref 5.0–8.0)

## 2023-10-19 LAB — CBC
HCT: 34.3 % — ABNORMAL LOW (ref 39.0–52.0)
Hemoglobin: 11.1 g/dL — ABNORMAL LOW (ref 13.0–17.0)
MCH: 30.4 pg (ref 26.0–34.0)
MCHC: 32.4 g/dL (ref 30.0–36.0)
MCV: 94 fL (ref 80.0–100.0)
Platelets: 172 10*3/uL (ref 150–400)
RBC: 3.65 MIL/uL — ABNORMAL LOW (ref 4.22–5.81)
RDW: 15.1 % (ref 11.5–15.5)
WBC: 8.2 10*3/uL (ref 4.0–10.5)
nRBC: 0 % (ref 0.0–0.2)

## 2023-10-19 LAB — COMPREHENSIVE METABOLIC PANEL WITH GFR
ALT: 10 U/L (ref 0–44)
AST: 17 U/L (ref 15–41)
Albumin: 3.3 g/dL — ABNORMAL LOW (ref 3.5–5.0)
Alkaline Phosphatase: 58 U/L (ref 38–126)
Anion gap: 10 (ref 5–15)
BUN: 17 mg/dL (ref 8–23)
CO2: 22 mmol/L (ref 22–32)
Calcium: 8.5 mg/dL — ABNORMAL LOW (ref 8.9–10.3)
Chloride: 108 mmol/L (ref 98–111)
Creatinine, Ser: 1.47 mg/dL — ABNORMAL HIGH (ref 0.61–1.24)
GFR, Estimated: 44 mL/min — ABNORMAL LOW (ref >60)
Glucose, Bld: 112 mg/dL — ABNORMAL HIGH (ref 70–99)
Potassium: 3.8 mmol/L (ref 3.5–5.1)
Sodium: 140 mmol/L (ref 135–145)
Total Bilirubin: 0.7 mg/dL (ref 0.0–1.2)
Total Protein: 6.4 g/dL — ABNORMAL LOW (ref 6.5–8.1)

## 2023-10-19 LAB — TROPONIN I (HIGH SENSITIVITY)
Troponin I (High Sensitivity): 10 ng/L (ref <18)
Troponin I (High Sensitivity): 9 ng/L (ref <18)

## 2023-10-19 MED ORDER — SODIUM CHLORIDE 0.9 % IV SOLN: 1.0000 g | Freq: Once | INTRAVENOUS | Status: DC

## 2023-10-19 MED ORDER — CEFPODOXIME PROXETIL 200 MG PO TABS: 200.0000 mg | ORAL_TABLET | Freq: Two times a day (BID) | ORAL | 0 refills | Status: DC

## 2023-10-19 MED ORDER — PIPERACILLIN-TAZOBACTAM 3.375 G IVPB 30 MIN
3.3750 g | Freq: Once | INTRAVENOUS | Status: AC
  Administered 2023-10-19: 3.375 g via INTRAVENOUS
  Filled 2023-10-19: qty 50

## 2023-10-19 NOTE — Progress Notes (Signed)
 ED Pharmacy Antibiotic Sign Off An antibiotic consult was received from an ED provider for zosyn per pharmacy dosing for UTI. A chart review was completed to assess appropriateness.  The following one time order(s) were placed per pharmacy consult:  zosyn 3.375g x 1 dose  Further antibiotic and/or antibiotic pharmacy consults should be ordered by the admitting provider if indicated.   Thank you for allowing pharmacy to be a part of this patient's care.   Delmar Landau, PharmD, BCPS 10/19/2023 6:08 PM ED Clinical Pharmacist -  [phone]

## 2023-10-19 NOTE — ED Provider Notes (Signed)
 [place] EMERGENCY DEPARTMENT AT [facility] Provider Note   CSN: [id number] Arrival date & time: 10/19/23  1318     History  Chief Complaint  Patient presents with   Fatigue   Shortness of Breath   Back Pain   UTI   Dysuria    Tyler [name] is a 89 y.o. male with PMH as listed below who presents with fatigue, sob, back pain, UTI. Daughter reports symptoms similar to previous low hemoglobin vs uti. Prone to both reportedly. SOB with exertion recently, can't walk more than a few feet.   History provided by daughter at bedside. They brtought him because she was concerned he may have a UTI. Approximately two weeks ago noticed fatigue and having some dyspnea on exertion. Also notes pain in his right flank/lower back which is new. He has been a little "out of it" which is common with UTIs.  Has h/o recurrent UTIs and follows with urology at [facility], has been treated in the past with outpatient IV cefepime. H/o bladder tumor s/p resection.   Past Medical History:  Diagnosis Date   AAA (abdominal aortic aneurysm) (HCC)    a. 05/2014 s/p stent grafting [facility]).   Bronchitis    CAD (coronary artery disease)    a. 06/2013 VF Arrest/Cath: 3VD, EF 50%;  b. 06/2013 CABG x 4: LIMA->LAD, VG->PDA->RPL, VG->OM.   CVA (cerebral infarction)    Erectile dysfunction    Essential hypertension    GERD (gastroesophageal reflux disease)    H/O hiatal hernia    HOH (hard of hearing)    wears hearing aid in right ear   Hyperlipidemia    Osteoarthritis    Pneumonia    YRS AGO   Stroke [facility])        Home Medications Prior to Admission medications   Medication Sig Start Date End Date Taking? Authorizing Provider  alfuzosin (UROXATRAL) 10 MG 24 hr tablet Take 1 tablet by mouth at bedtime. 08/18/23  Yes [provider]  aspirin EC 81 MG tablet Take 1 tablet (81 mg total) by mouth daily. Swallow whole. Hold if  overt external bleeding 02/27/22  Yes Albertine Grates, MD   atorvastatin (LIPITOR) 40 MG tablet Take 40 mg by mouth at bedtime.   Yes [provider]  clopidogrel (PLAVIX) 75 MG tablet Take 1 tablet (75 mg total) by mouth daily. Hold plavix, resume when ok with urology Patient taking differently: Take 75 mg by mouth in the morning. 02/27/22  Yes Albertine Grates, MD  ferrous gluconate (FERGON) 324 MG tablet Take 324 mg by mouth 2 (two) times daily with a meal.   Yes [provider]  furosemide (LASIX) 20 MG tablet Take 1 tablet (20 mg total) by mouth daily as needed (swelling). Patient taking differently: Take 20 mg by mouth daily as needed (for swelling). 03/02/22  Yes Lyn Records, MD  metoprolol tartrate (LOPRESSOR) 25 MG tablet Take 0.5 tablets (12.5 mg total) by mouth 2 (two) times daily. Patient taking differently: Take 25 mg by mouth daily as needed (for sBP greater than 169). 08/29/13  Yes Lyn Records, MD  nitroGLYCERIN (NITROSTAT) 0.4 MG SL tablet Place 1 tablet (0.4 mg total) under the tongue every 5 (five) minutes as needed for chest pain. 10/29/21  Yes Lyn Records, MD  pantoprazole (PROTONIX) 40 MG tablet Take 1 tablet (40 mg total) by mouth daily. 04/05/22 10/18/24 Yes Lanae Boast, MD  triamcinolone cream (KENALOG) 0.1 % Apply 1 Application topically  2 (two) times daily. Patient taking differently: Apply 1 Application topically daily as needed (for itching). 12/18/22  Yes Gareth Eagle, PA-C  cyanocobalamin 1000 MCG tablet Take 1 tablet (1,000 mcg total) by mouth daily. 10/24/23   Narda Bonds, MD      Allergies    Bactrim [sulfamethoxazole-trimethoprim]    Review of Systems   Review of Systems A 10 point review of systems was performed and is negative unless otherwise reported in HPI.  Physical Exam Updated Vital Signs BP 125/61 (BP Location: Left Arm)   Pulse 82   Temp 99 F (37.2 C) (Oral)   Resp 16   Ht 5\' 7"  (1.702 m)   Wt 102 kg Comment: 06/2023  SpO2 97%   BMI 35.22 kg/m  Physical Exam General: Normal  appearing male, lying in bed.  HEENT: PERRLA, Sclera anicteric, MMM, trachea midline.  Cardiology: RRR, no murmurs/rubs/gallops. Resp: Normal respiratory rate and effort. CTAB, no wheezes, rhonchi, crackles.  Abd: Soft, non-tender, non-distended. No rebound tenderness or guarding.  GU: Deferred. MSK: No peripheral edema or signs of trauma. Extremities without deformity or TTP. No cyanosis or clubbing. Skin: warm, dry. No rashes or lesions. Back: +R CVA tenderness Neuro: A&Ox4, CNs II-XII grossly intact. MAEs. Sensation grossly intact.   ED Results / Procedures / Treatments   Labs (all labs ordered are listed, but only abnormal results are displayed) Labs Reviewed  URINE CULTURE - Abnormal; Notable for the following components:      Result Value   Culture   (*)    Value: >=100,000 COLONIES/mL PSEUDOMONAS AERUGINOSA Two isolates with different morphologies were identified as the same organism.The most resistant organism was reported. Performed at [facility], [address] [place], [place] [address]    Organism ID, Bacteria PSEUDOMONAS AERUGINOSA (*)    All other components within normal limits  CBC - Abnormal; Notable for the following components:   RBC 3.65 (*)    Hemoglobin 11.1 (*)    HCT 34.3 (*)    All other components within normal limits  COMPREHENSIVE METABOLIC PANEL WITH GFR - Abnormal; Notable for the following components:   Glucose, Bld 112 (*)    Creatinine, Ser 1.47 (*)    Calcium 8.5 (*)    Total Protein 6.4 (*)    Albumin 3.3 (*)    GFR, Estimated 44 (*)    All other components within normal limits  URINALYSIS, ROUTINE W REFLEX MICROSCOPIC - Abnormal; Notable for the following components:   APPearance CLOUDY (*)    Hgb urine dipstick MODERATE (*)    Nitrite POSITIVE (*)    Leukocytes,Ua LARGE (*)    Bacteria, UA FEW (*)    All other components within normal limits  IRON AND TIBC - Abnormal; Notable for the following components:   Iron 26 (*)     Saturation Ratios 8 (*)    All other components within normal limits  RETICULOCYTES - Abnormal; Notable for the following components:   RBC. 3.69 (*)    Immature Retic Fract 23.3 (*)    All other components within normal limits  CK - Abnormal; Notable for the following components:   Total CK 38 (*)    All other components within normal limits  COMPREHENSIVE METABOLIC PANEL WITH GFR - Abnormal; Notable for the following components:   Creatinine, Ser 1.36 (*)    Calcium 8.1 (*)    Total Protein 5.8 (*)    Albumin 3.0 (*)    GFR,  Estimated 49 (*)    All other components within normal limits  CBC - Abnormal; Notable for the following components:   RBC 3.35 (*)    Hemoglobin 10.1 (*)    HCT 31.8 (*)    Platelets 148 (*)    All other components within normal limits  RENAL FUNCTION PANEL - Abnormal; Notable for the following components:   Glucose, Bld 135 (*)    Creatinine, Ser 1.46 (*)    Calcium 8.7 (*)    Albumin 3.1 (*)    GFR, Estimated 45 (*)    All other components within normal limits  CBC - Abnormal; Notable for the following components:   RBC 3.58 (*)    Hemoglobin 10.7 (*)    HCT 33.5 (*)    All other components within normal limits  VITAMIN B12  FOLATE  FERRITIN  MAGNESIUM  PHOSPHORUS  MAGNESIUM  PHOSPHORUS  MAGNESIUM  TROPONIN I (HIGH SENSITIVITY)  TROPONIN I (HIGH SENSITIVITY)    EKG EKG Interpretation Date/Time:  Thursday October 19 2023 15:17:53 EDT Ventricular Rate:  73 PR Interval:  190 QRS Duration:  142 QT Interval:  430 QTC Calculation: 473 R Axis:   94  Text Interpretation: Normal sinus rhythm Rightward axis Left bundle branch block Abnormal ECG When compared with ECG of 17-Aug-2022 21:04, PREVIOUS ECG IS PRESENT Confirmed by Vivi Barrack [phone]) on 10/19/2023 8:05:26 PM  Radiology No results found.   Procedures Procedures    Medications Ordered in ED Medications  piperacillin-tazobactam (ZOSYN) IVPB 3.375 g (0 g Intravenous Stopped  10/19/23 2124)    ED Course/ Medical Decision Making/ A&P                          Medical Decision Making Amount and/or Complexity of Data Reviewed Labs:  Decision-making details documented in ED Course. Radiology: ordered. Decision-making details documented in ED Course.  Risk Prescription drug management. Decision regarding hospitalization.    This patient presents to the ED for concern of flank pain, dyspnea, this involves an extensive number of treatment options, and is a complaint that carries with it a high risk of complications and morbidity.  I considered the following differential and admission for this acute, potentially life threatening condition.   MDM:    For his dyspnea, patient does not have any hypoxia or acute respiratory distress.  No abnormal lung sounds.  Chest x-ray does not demonstrate any pneumonia or pleural effusion.  He has no wheezing to suggest COPD.  He reports no chest pain to suggest ACS, and reassuringly his EKG does not demonstrate signs of infection and his troponin is negative.  Consider possible anemia and his hemoglobin is mildly down but is only 11.1, would not expect significant dyspnea from this number.  Patient with history of lower extremity symptoms to indicate DVT or PE, no chest pain.  DDX for flank pain includes but is not limited to: Does have history of recurrent UTIs and greatest concern for pyelonephritis or ureterolithiasis.  Reassuringly CT does not demonstrate any ureteral lithiasis.  Patient likely with pyelonephritis.  He is complicated micro history including Enterococcus, Klebsiella, Pseudomonas, all with different sensitivities, and so we will initially treat with broad-spectrum IV Zosyn.  Will discuss with pharmacy.   Clinical Course as of 10/28/23 0722  Thu Oct 19, 2023  1725 Urinalysis, Routine w reflex microscopic -Urine, Clean Catch(!) +UTI [HN]  1725 Troponin I (High Sensitivity): 10 neg [HN]  1725 WBC: 8.2 No  leukocytosis [HN]  1726 Hemoglobin(!): 11.1 Down from 12.5 ten months ago [HN]  1726 DG Chest 1 View No active disease. [HN]  2146 CT Renal Stone Study Stable aortic stent graft with residual aneurysm sac.  No focal abnormality to correspond with the given clinical history is noted.  Mild left basilar atelectasis.   [HN]  2147 No stone. Patient likely w/ pyelonephritis [HN]  2147 Patient received a dose of zosyn IV here. D/w daughters at bedside and performed shared decision making. Given his age and h/o complicated UTIs believe that admission and monitoring for improvement would be best course of action for him. Family agrees. [HN]    Clinical Course User Index [HN] Loetta Rough, MD    Labs: I Ordered, and personally interpreted labs.  The pertinent results include: Those listed above  Imaging Studies ordered: I ordered imaging studies including chest x-ray, renal stone study I independently visualized and interpreted imaging. I agree with the radiologist interpretation  Additional history obtained from chart review, daughter is at bedside.  Cardiac Monitoring: The patient was maintained on a cardiac monitor.  I personally viewed and interpreted the cardiac monitored which showed an underlying rhythm of: Normal sinus rhythm  Reevaluation: After the interventions noted above, I reevaluated the patient and found that they have :improved  Social Determinants of Health: Lives with daughter  Disposition:  Admit to hospitalist  Co morbidities that complicate the patient evaluation  Past Medical History:  Diagnosis Date   AAA (abdominal aortic aneurysm) (HCC)    a. 05/2014 s/p stent grafting [facility]).   Bronchitis    CAD (coronary artery disease)    a. 06/2013 VF Arrest/Cath: 3VD, EF 50%;  b. 06/2013 CABG x 4: LIMA->LAD, VG->PDA->RPL, VG->OM.   CVA (cerebral infarction)    Erectile dysfunction    Essential hypertension    GERD (gastroesophageal reflux disease)     H/O hiatal hernia    HOH (hard of hearing)    wears hearing aid in right ear   Hyperlipidemia    Osteoarthritis    Pneumonia    YRS AGO   Stroke (HCC)      Medicines Meds ordered this encounter  Medications   DISCONTD: cefTRIAXone (ROCEPHIN) 1 g in sodium chloride 0.9 % 100 mL IVPB    Antibiotic Indication::   UTI   piperacillin-tazobactam (ZOSYN) IVPB 3.375 g    Antibiotic Indication::   Other Indication (list below)    Other Indication::   UTI   DISCONTD: cefpodoxime (VANTIN) 200 MG tablet    Sig: Take 1 tablet (200 mg total) by mouth 2 (two) times daily for 10 days.    Dispense:  20 tablet    Refill:  0   DISCONTD: atorvastatin (LIPITOR) tablet 40 mg   DISCONTD: pantoprazole (PROTONIX) EC tablet 40 mg   DISCONTD: 0.9 %  sodium chloride infusion   DISCONTD: acetaminophen (TYLENOL) tablet 650 mg   DISCONTD: acetaminophen (TYLENOL) suppository 650 mg   DISCONTD: HYDROcodone-acetaminophen (NORCO/VICODIN) 5-325 MG per tablet 1-2 tablet   DISCONTD: ondansetron (ZOFRAN) tablet 4 mg   DISCONTD: ondansetron (ZOFRAN) injection 4 mg   DISCONTD: clopidogrel (PLAVIX) tablet 75 mg    Hold plavix, resume when ok with urology     DISCONTD: aspirin EC tablet 81 mg    Swallow whole. Hold if  overt external bleeding     DISCONTD: ceFEPIme (MAXIPIME) 1 g in sodium chloride 0.9 % 100 mL IVPB    Antibiotic Indication::   UTI  DISCONTD: alfuzosin (UROXATRAL) 24 hr tablet 10 mg   DISCONTD: ferrous gluconate (FERGON) tablet 324 mg   cyanocobalamin (VITAMIN B12) injection 1,000 mcg   DISCONTD: cyanocobalamin (VITAMIN B12) tablet 1,000 mcg   iron sucrose (VENOFER) 300 mg in sodium chloride 0.9 % 250 mL IVPB    My patient has the following indication for IV iron use and meets the criteria for inpatient administration::   Rapid increase in hemoglobin required   DISCONTD: hydrocerin (EUCERIN) cream   gentamicin (GARAMYCIN) 400 mg in dextrose 5 % 50 mL IVPB    Antibiotic Indication::   UTI    cyanocobalamin 1000 MCG tablet    Sig: Take 1 tablet (1,000 mcg total) by mouth daily.    Dispense:  30 tablet    Refill:  0    I have reviewed the patients home medicines and have made adjustments as needed  Problem List / ED Course: Problem List Items Addressed This Visit   None Visit Diagnoses       Pyelonephritis    -  Primary                   This note was created using dictation software, which may contain spelling or grammatical errors.    Loetta Rough, MD 10/28/23 [phone]

## 2023-10-19 NOTE — ED Triage Notes (Addendum)
 Daughter stated, He has been really fatigue around 2 weeks, moving he gets SOB, He has had some bad back pain. He is really pushing out to pee. He does have low hemoglobin and has had to get blood before. We think he has a UTI and has low blood. When he gets UTI he gets a little confused.

## 2023-10-19 NOTE — Subjective & Objective (Signed)
 Presents with right flank pain fatigue malaise no fever no chills, no N/V Has hx of reccurent UTI  Hx of Bladder tumor sp ressection  Complicated UTI needing IV abx Was diagnosed with pyelonephritis Given a dose of Zosyn

## 2023-10-19 NOTE — H&P (Signed)
 Tyler [name] ZOX:[id number] DOB: [birth year] DOA: 10/19/2023     PCP: Sondra Come, MD   Outpatient Specialists:  CARDS: Dr. Lesleigh Noe, MD (Inactive)   Urology Dr.  Earlene Plater ID at [facility] Zachery Dauer, Charolotte Capuchin, MD    Patient arrived to ER on 10/19/23 at 1318 Referred by Attending Loetta Rough, MD   Patient coming from:    home Lives   With family     Chief Complaint:   Chief Complaint  Patient presents with   Fatigue   Shortness of Breath   Back Pain   UTI   Dysuria    HPI: Tyler [name] is a 89 y.o. male with medical history significant of recurrent UTI with resistant organisms including Pseudomonas, bladder ca, bilateral carotid artery occlusion Hyperlipidemia, CKD stage IIIa, CAD with CABG, HTN, ICA stenosis history of CVA on Plavix, BPH, history of blood loss anemia small AVMs, diastolic CHF  Presented with   falnk paina nd dysuria Presents with right flank pain fatigue malaise no fever no chills, no N/V Has hx of reccurent UTI  Hx of Bladder tumor sp ressection  Complicated UTI needing IV abx Was diagnosed with pyelonephritis Given a dose of Zosyn    Patient with history of recurrent UTIs Has been treated as an outpatient with IV antibiotics (cefepime) through [facility] at infusion clinic In the past has been now off of antibiotics  Developed about a week ago malaise back pain, foul-smelling urine And was brought for evaluation  Back in 2023 urine culture grew 100,000 E faecalis and 10,000 Pseudomonas discussed with ID advised to continue ampicillin to treat a faecalis as Pseudomonas less likely the causative agent,   Since then [facility] [place] Since then he has had Enterococcus faecalis in his urine   Family states that he father urological procedures and they were told that he is less likely now to have urinary tract infection Last urine culture on February 25 was unremarkable prior to this  urine culture grew Proteus mirabilis Resistant to Cipro nitrofurantoin tetracycline and Pseudomonas also resistant to Cipro but sensitive to cefepime and Zosyn   Denies significant ETOH intake    Does not smoke    Regarding pertinent Chronic problems:    Hyperlipidemia -  on statins Lipitor (atorvastatin)  Lipid Panel     Component Value Date/Time   CHOL 126 03/25/2021 0209   CHOL 160 06/03/2020 1603   TRIG 127 03/25/2021 0209   HDL 26 (L) 03/25/2021 0209   HDL 35 (L) 06/03/2020 1603   CHOLHDL 4.8 03/25/2021 0209   VLDL 25 03/25/2021 0209   LDLCALC 75 03/25/2021 0209   LDLCALC 100 (H) 06/03/2020 1603   LABVLDL 25 06/03/2020 1603     HTN on metoprolol   chronic CHF diastolic  - last echo  Recent Results (from the past 81191 hours)  ECHOCARDIOGRAM COMPLETE   Collection Time: 03/25/21 10:35 AM  Result Value   Weight 3,668.45   Height 70   BP 136/65   S' Lateral 3.20   AR max vel 2.39   AV Area VTI 2.37   AV Mean grad 7.0   AV Peak grad 13.1   Ao pk vel 1.81   Area-P 1/2 2.66   AV Area mean vel 2.13   Narrative      ECHOCARDIOGRAM REPORT       1. Left ventricular ejection fraction, by estimation, is 60 to  65%. The left ventricle has normal function. The left ventricle has no regional wall motion abnormalities. Left ventricular diastolic parameters are consistent with Grade I diastolic  dysfunction (impaired relaxation).  2. Right ventricular systolic function is normal. The right ventricular size is normal.  3. The mitral valve is normal in structure. No evidence of mitral valve regurgitation. No evidence of mitral stenosis.  4. The aortic valve is calcified. There is moderate calcification of the aortic valve. There is moderate thickening of the aortic valve. Aortic valve regurgitation is not visualized. Mild to moderate aortic valve sclerosis/calcification is present,  without any evidence of aortic stenosis.  5. The inferior vena cava is normal in size with greater  than 50% respiratory variability, suggesting right atrial pressure of 3 mmHg.  Conclusion(s)/Recommendation(s): No intracardiac source of embolism detected on this transthoracic study. A transesophageal echocardiogram is recommended to exclude cardiac source of embolism if clinically indicated.             CAD  - On Aspirin, statin,  Plavix                 -  followed by cardiology                   Hx of CVA -  with/out residual deficits on  Plavix        CKD stage IIIb baseline Cr 1.3 CrCl cannot be calculated (Unknown ideal weight.).  Lab Results  Component Value Date   CREATININE 1.47 (H) 10/19/2023   CREATININE 1.91 (H) 12/18/2022   CREATININE 1.26 (H) 08/17/2022   Lab Results  Component Value Date   NA 140 10/19/2023   CL 108 10/19/2023   K 3.8 10/19/2023   CO2 22 10/19/2023   BUN 17 10/19/2023   CREATININE 1.47 (H) 10/19/2023   GFRNONAA 44 (L) 10/19/2023   CALCIUM 8.5 (L) 10/19/2023   ALBUMIN 3.3 (L) 10/19/2023   GLUCOSE 112 (H) 10/19/2023          Chronic anemia - baseline hg Hemoglobin & Hematocrit  Recent Labs    12/18/22 1624 10/19/23 1507  HGB 12.5* 11.1*   Iron/TIBC/Ferritin/ %Sat    Component Value Date/Time   IRON 19 (L) 02/24/2022 2106   TIBC 421 02/24/2022 2106   FERRITIN 11 (L) 02/24/2022 2106   IRONPCTSAT 5 (L) 02/24/2022 2106        Cancer:bladder cancer followed by URology      While in ER: Clinical Course as of 10/19/23 2334  Thu Oct 19, 2023  1725 Urinalysis, Routine w reflex microscopic -Urine, Clean Catch(!) +UTI [HN]  1725 Troponin I (High Sensitivity): 10 neg [HN]  1725 WBC: 8.2 No leukocytosis [HN]  1726 Hemoglobin(!): 11.1 Down from 12.5 ten months ago [HN]  1726 DG Chest 1 View No active disease. [HN]  2146 CT Renal Stone Study Stable aortic stent graft with residual aneurysm sac.  No focal abnormality to correspond with the given clinical history is noted.  Mild left basilar atelectasis.   [HN]  2147 No  stone. Patient likely w/ pyelonephritis [HN]  2147 Patient received a dose of zosyn IV here. Discussed with pharmacst [place] and will DC w/ cefpodoxime and await culture results. [HN]    Clinical Course User Index [HN] Loetta Rough, MD       Lab Orders         Urine Culture         CBC  Comprehensive metabolic panel         Urinalysis, Routine w reflex microscopic -Urine, Clean Catch       CXR -  NON acute  CTabd/pelvis - Stable aortic stent graft with residual aneurysm sac.   No focal abnormality to correspond with the given clinical history is noted.   Mild left basilar atelectasis.   Following Medications were ordered in ER: Medications  piperacillin-tazobactam (ZOSYN) IVPB 3.375 g (0 g Intravenous Stopped 10/19/23 2124)       ED Triage Vitals  Encounter Vitals Group     BP 10/19/23 1510 135/69     Systolic BP Percentile --      Diastolic BP Percentile --      Pulse Rate 10/19/23 1510 69     Resp 10/19/23 1510 15     Temp 10/19/23 1510 97.7 F (36.5 C)     Temp Source 10/19/23 2030 Oral     SpO2 10/19/23 1510 100 %     Weight --      Height --      Head Circumference --      Peak Flow --      Pain Score 10/19/23 1502 5     Pain Loc --      Pain Education --      Exclude from Growth Chart --   QMVH(84)@     _________________________________________ Significant initial  Findings: Abnormal Labs Reviewed  CBC - Abnormal; Notable for the following components:      Result Value   RBC 3.65 (*)    Hemoglobin 11.1 (*)    HCT 34.3 (*)    All other components within normal limits  COMPREHENSIVE METABOLIC PANEL WITH GFR - Abnormal; Notable for the following components:   Glucose, Bld 112 (*)    Creatinine, Ser 1.47 (*)    Calcium 8.5 (*)    Total Protein 6.4 (*)    Albumin 3.3 (*)    GFR, Estimated 44 (*)    All other components within normal limits  URINALYSIS, ROUTINE W REFLEX MICROSCOPIC - Abnormal; Notable for the following components:    APPearance CLOUDY (*)    Hgb urine dipstick MODERATE (*)    Nitrite POSITIVE (*)    Leukocytes,Ua LARGE (*)    Bacteria, UA FEW (*)    All other components within normal limits   ___________ Troponin  ordered Cardiac Panel (last 3 results) Recent Labs    10/19/23 1507 10/19/23 2028  TROPONINIHS 10 9    ECG: Ordered Personally reviewed and interpreted by me showing: HR : 73 Rhythm: Normal sinus rhythm Rightward axis Left bundle branch block Abnormal ECG QTC 473   ____________________   The recent clinical data is shown below. Vitals:   10/19/23 1945 10/19/23 2030 10/19/23 2245 10/19/23 2300  BP: 133/64  118/65 113/64  Pulse: 64  63 64  Resp: 17  16 13   Temp:  97.9 F (36.6 C)    TempSrc:  Oral    SpO2: 100%  100% 97%    WBC     Component Value Date/Time   WBC 8.2 10/19/2023 1507   LYMPHSABS 1.8 12/18/2022 1624   MONOABS 0.7 12/18/2022 1624   EOSABS 0.1 12/18/2022 1624   BASOSABS 0.1 12/18/2022 1624       UA   evidence of UTI     Urine analysis:    Component Value Date/Time   COLORURINE YELLOW 10/19/2023 1508   APPEARANCEUR CLOUDY (A) 10/19/2023 1508   LABSPEC  1.014 10/19/2023 1508   PHURINE 5.0 10/19/2023 1508   GLUCOSEU NEGATIVE 10/19/2023 1508   HGBUR MODERATE (A) 10/19/2023 1508   BILIRUBINUR NEGATIVE 10/19/2023 1508   KETONESUR NEGATIVE 10/19/2023 1508   PROTEINUR NEGATIVE 10/19/2023 1508   UROBILINOGEN 0.2 08/22/2014 1559   NITRITE POSITIVE (A) 10/19/2023 1508   LEUKOCYTESUR LARGE (A) 10/19/2023 1508    Results for orders placed or performed during the hospital encounter of 12/18/22  Urine Culture     Status: Abnormal   Collection Time: 12/18/22  4:02 PM   Specimen: Urine, Clean Catch  Result Value Ref Range Status   Specimen Description   Final    URINE, CLEAN CATCH Performed at [facility], [address] [place], [place] [address]    Special Requests   Final    NONE Performed at [facility],  [address], [place], [place] [address]    Culture >=100,000 COLONIES/mL ENTEROCOCCUS FAECALIS (A)  Final   Report Status 12/20/2022 FINAL  Final   Organism ID, Bacteria ENTEROCOCCUS FAECALIS (A)  Final      Susceptibility   Enterococcus faecalis - MIC*    AMPICILLIN <=2 SENSITIVE Sensitive     NITROFURANTOIN <=16 SENSITIVE Sensitive     VANCOMYCIN 1 SENSITIVE Sensitive     * >=100,000 COLONIES/mL ENTEROCOCCUS FAECALIS    ABX started Antibiotics Given (last 72 hours)     Date/Time Action Medication Dose Rate   10/19/23 2029 New Bag/Given   piperacillin-tazobactam (ZOSYN) IVPB 3.375 g 3.375 g 100 mL/hr        _________________________________________ Recent Labs  Lab 10/19/23 1507  NA 140  K 3.8  CO2 22  GLUCOSE 112*  BUN 17  CREATININE 1.47*  CALCIUM 8.5*    Cr    stable,    Lab Results  Component Value Date   CREATININE 1.47 (H) 10/19/2023   CREATININE 1.91 (H) 12/18/2022   CREATININE 1.26 (H) 08/17/2022    Recent Labs  Lab 10/19/23 1507  AST 17  ALT 10  ALKPHOS 58  BILITOT 0.7  PROT 6.4*  ALBUMIN 3.3*   Lab Results  Component Value Date   CALCIUM 8.5 (L) 10/19/2023    Plt: Lab Results  Component Value Date   PLT 172 10/19/2023       Recent Labs  Lab 10/19/23 1507  WBC 8.2  HGB 11.1*  HCT 34.3*  MCV 94.0  PLT 172    HG/HCT   stable,      Component Value Date/Time   HGB 11.1 (L) 10/19/2023 1507   HGB 13.2 06/03/2020 1603   HCT 34.3 (L) 10/19/2023 1507   HCT 40.1 06/03/2020 1603   MCV 94.0 10/19/2023 1507   MCV 92 06/03/2020 1603    _______________________________________________ Hospitalist was called for admission for   Pyelonephritis    The following Work up has been ordered so far:  Orders Placed This Encounter  Procedures   Urine Culture   DG Chest 1 View   CT Renal Stone Study   CBC   Comprehensive metabolic panel   Urinalysis, Routine w reflex microscopic -Urine, Clean Catch   Measure post void residual    Consult to hospitalist   ED EKG         Cultures:    Component Value Date/Time   SDES  12/18/2022 1602    URINE, CLEAN CATCH Performed at [facility], [address], [place], [place] [address]    [facility]  12/18/2022 [phone]  NONE Performed at [facility], [address], [place], [place] [address]    CULT >=100,000 COLONIES/mL ENTEROCOCCUS FAECALIS (A) 12/18/2022 1602   REPTSTATUS 12/20/2022 FINAL 12/18/2022 1602     Radiological Exams on Admission: CT Renal Stone Study Result Date: 10/19/2023 CLINICAL DATA:  Flank pain EXAM: CT ABDOMEN AND PELVIS WITHOUT CONTRAST TECHNIQUE: Multidetector CT imaging of the abdomen and pelvis was performed following the standard protocol without IV contrast. RADIATION DOSE REDUCTION: This exam was performed according to the departmental dose-optimization program which includes automated exposure control, adjustment of the mA and/or kV according to patient size and/or use of iterative reconstruction technique. COMPARISON:  02/03/2022 FINDINGS: Lower chest: Mild atelectatic changes are noted in the left base. Minimal left pleural effusion is seen. Hepatobiliary: No focal liver abnormality is seen. No gallstones, gallbladder wall thickening, or biliary dilatation. Pancreas: Unremarkable. No pancreatic ductal dilatation or surrounding inflammatory changes. Spleen: Normal in size without focal abnormality. Adrenals/Urinary Tract: Adrenal glands are within normal limits. Kidneys are well visualized bilaterally. No renal calculi or obstructive changes are seen. The ureters are within normal limits. Bladder is partially distended. Stomach/Bowel: No obstructive or inflammatory changes of the colon are noted. The appendix is within normal limits. Small bowel and stomach are unremarkable. Vascular/Lymphatic: Atherosclerotic calcifications of the aorta are noted. Aortic stent graft is seen in satisfactory position. The aneurysm  sac is stable at 6.2 cm. The lack of contrast precludes evaluation for endoleak although no significant enlargement of the sac is identified to suggest endoleak. No lymphadenopathy is noted. Reproductive: Prostate is unremarkable. Other: No abdominal wall hernia or abnormality. No abdominopelvic ascites. Musculoskeletal: Left hip replacement is noted. Degenerative changes of lumbar spine are seen. IMPRESSION: Stable aortic stent graft with residual aneurysm sac. No focal abnormality to correspond with the given clinical history is noted. Mild left basilar atelectasis. Electronically Signed   By: Alcide Clever M.D.   On: 10/19/2023 21:43   DG Chest 1 View Result Date: 10/19/2023 CLINICAL DATA:  Acute shortness of breath. EXAM: CHEST  1 VIEW COMPARISON:  August 17, 2022. FINDINGS: The heart size and mediastinal contours are within normal limits. Status post coronary artery bypass graft. Both lungs are clear. The visualized skeletal structures are unremarkable. IMPRESSION: No active disease. Electronically Signed   By: Lupita Raider M.D.   On: 10/19/2023 16:02   _______________________________________________________________________________________________________ Latest  Blood pressure 113/64, pulse 64, temperature 97.9 F (36.6 C), temperature source Oral, resp. rate 13, SpO2 97%.   Vitals  labs and radiology finding personally reviewed  Review of Systems:  change in color of urine,   flank pain.   Pertinent positives include:   dysuria,   chills, fatigue, Constitutional:  No weight loss, night sweats, Fevers, weight loss  HEENT:  No headaches, Difficulty swallowing,Tooth/dental problems,Sore throat,  No sneezing, itching, ear ache, nasal congestion, post nasal drip,  Cardio-vascular:  No chest pain, Orthopnea, PND, anasarca, dizziness, palpitations.no Bilateral lower extremity swelling  GI:  No heartburn, indigestion, abdominal pain, nausea, vomiting, diarrhea, change in bowel habits, loss of  appetite, melena, blood in stool, hematemesis Resp:  no shortness of breath at rest. No dyspnea on exertion, No excess mucus, no productive cough, No non-productive cough, No coughing up of blood.No change in color of mucus.No wheezing. Skin:  no rash or lesions. No jaundice GU:   Musculoskeletal:  No joint pain or no joint swelling. No decreased range of motion. No back pain.  Psych:  No change in mood or  affect. No depression or anxiety. No memory loss.  Neuro: no localizing neurological complaints, no tingling, no weakness, no double vision, no gait abnormality, no slurred speech, no confusion  All systems reviewed and apart from HOPI all are negative _______________________________________________________________________________________________ Past Medical History:   Past Medical History:  Diagnosis Date   AAA (abdominal aortic aneurysm) (HCC)    a. 05/2014 s/p stent grafting [facility]).   Bronchitis    CAD (coronary artery disease)    a. 06/2013 VF Arrest/Cath: 3VD, EF 50%;  b. 06/2013 CABG x 4: LIMA->LAD, VG->PDA->RPL, VG->OM.   CVA (cerebral infarction)    Erectile dysfunction    Essential hypertension    GERD (gastroesophageal reflux disease)    H/O hiatal hernia    HOH (hard of hearing)    wears hearing aid in right ear   Hyperlipidemia    Osteoarthritis    Pneumonia    YRS AGO   Stroke [facility])       Past Surgical History:  Procedure Laterality Date   COLONOSCOPY WITH PROPOFOL N/A 02/26/2022   Procedure: COLONOSCOPY WITH PROPOFOL;  Surgeon: Jenel Lucks, MD;  Location: [facility];  Service: Gastroenterology;  Laterality: N/A;   CORONARY ARTERY BYPASS GRAFT N/A 06/27/2013   Procedure: CORONARY ARTERY BYPASS GRAFTING (CABG);  Surgeon: Kerin Perna, MD;  Location: [facility] OR;  Service: Open Heart Surgery;  Laterality: N/A;  times four utilizing the left internal mammary artery and the right greater saphenous vein harvested endoscopically   ENDOVEIN HARVEST OF  GREATER SAPHENOUS VEIN Right 06/27/2013   Procedure: ENDOVEIN HARVEST OF GREATER SAPHENOUS VEIN;  Surgeon: Kerin Perna, MD;  Location: [facility] OR;  Service: Open Heart Surgery;  Laterality: Right;   ESOPHAGOGASTRODUODENOSCOPY (EGD) WITH PROPOFOL N/A 02/25/2022   Procedure: ESOPHAGOGASTRODUODENOSCOPY (EGD) WITH PROPOFOL;  Surgeon: Jeani Hawking, MD;  Location: [facility];  Service: Gastroenterology;  Laterality: N/A;   EYE SURGERY     bilateral cataracts   GIVENS CAPSULE STUDY N/A 02/26/2022   Procedure: GIVENS CAPSULE STUDY;  Surgeon: Jenel Lucks, MD;  Location: [facility];  Service: Gastroenterology;  Laterality: N/A;   HEMOSTASIS CLIP PLACEMENT  02/26/2022   Procedure: HEMOSTASIS CLIP PLACEMENT;  Surgeon: Jenel Lucks, MD;  Location: [facility];  Service: Gastroenterology;;   Nelle Don EAR SURGERY Left 1982   INNER EAR SURGERY     severed a nerve in his left ear.    LEFT HEART CATHETERIZATION WITH CORONARY ANGIOGRAM N/A 06/27/2013   Procedure: LEFT HEART CATHETERIZATION WITH CORONARY ANGIOGRAM;  Surgeon: Lesleigh Noe, MD;  Location: [facility] CATH LAB;  Service: Cardiovascular;  Laterality: N/A;  iabp insertion, cpr, intubation, emergent cvts   POLYPECTOMY  02/26/2022   Procedure: POLYPECTOMY;  Surgeon: Jenel Lucks, MD;  Location: [facility];  Service: Gastroenterology;;   TEE WITHOUT CARDIOVERSION N/A 06/27/2013   Procedure: TRANSESOPHAGEAL ECHOCARDIOGRAM (TEE);  Surgeon: Kerin Perna, MD;  Location: [facility] OR;  Service: Open Heart Surgery;  Laterality: N/A;   TONSILLECTOMY     TOTAL HIP ARTHROPLASTY Left 09/01/2014   Procedure: TOTAL HIP ARTHROPLASTY;  Surgeon: Nestor Lewandowsky, MD;  Location: [facility] OR;  Service: Orthopedics;  Laterality: Left;    Social History:  Ambulatory  walker  wheelchair bound,      reports that he has never smoked. He has never used smokeless tobacco. He reports that he does not drink alcohol and does not use drugs.    Family History:   Family  History  Problem Relation Age of  Onset   Heart disease Sister    Heart disease Brother    Heart attack Sister    Heart attack Brother    Colon cancer Neg Hx    Hypertension Neg Hx    Stroke Neg Hx    ______________________________________________________________________________________________ Allergies: Allergies  Allergen Reactions   Bactrim [Sulfamethoxazole-Trimethoprim] Rash     Prior to Admission medications   Medication Sig Start Date End Date Taking? Authorizing Provider  cefpodoxime (VANTIN) 200 MG tablet Take 1 tablet (200 mg total) by mouth 2 (two) times daily for 10 days. 10/20/23 10/30/23 Yes Loetta Rough, MD  aspirin EC 81 MG tablet Take 1 tablet (81 mg total) by mouth daily. Swallow whole. Hold if  overt external bleeding 02/27/22   Albertine Grates, MD  atorvastatin (LIPITOR) 40 MG tablet Take 40 mg by mouth at bedtime.    [provider]  cephALEXin (KEFLEX) 500 MG capsule Take 2 capsules (1,000 mg total) by mouth 2 (two) times daily. 12/18/22   Gareth Eagle, PA-C  clopidogrel (PLAVIX) 75 MG tablet Take 1 tablet (75 mg total) by mouth daily. Hold plavix, resume when ok with urology Patient taking differently: Take 75 mg by mouth in the morning. 02/27/22   Albertine Grates, MD  ferrous gluconate (FERGON) 324 MG tablet Take 324 mg by mouth daily with breakfast.    [provider]  furosemide (LASIX) 20 MG tablet Take 1 tablet (20 mg total) by mouth daily as needed (swelling). Patient taking differently: Take 20 mg by mouth daily as needed (for swelling). 03/02/22   Lyn Records, MD  metoprolol tartrate (LOPRESSOR) 25 MG tablet Take 0.5 tablets (12.5 mg total) by mouth 2 (two) times daily. 08/29/13   Lyn Records, MD  nitroGLYCERIN (NITROSTAT) 0.4 MG SL tablet Place 1 tablet (0.4 mg total) under the tongue every 5 (five) minutes as needed for chest pain. 10/29/21   Lyn Records, MD  pantoprazole (PROTONIX) 40 MG tablet Take 1 tablet (40 mg total) by mouth daily. 04/05/22  05/05/22  Lanae Boast, MD  phenazopyridine (PYRIDIUM) 200 MG tablet Take 1 tablet (200 mg total) by mouth 3 (three) times daily. 08/17/22   Sloan Leiter, DO  terazosin (HYTRIN) 5 MG capsule Take 5 mg by mouth at bedtime.    [provider]  triamcinolone cream (KENALOG) 0.1 % Apply 1 Application topically 2 (two) times daily. 12/18/22   Gareth Eagle, PA-C    ___________________________________________________________________________________________________ Physical Exam:    10/19/2023   11:00 PM 10/19/2023   10:45 PM 10/19/2023    7:45 PM  Vitals with BMI  Systolic 113 118 528  Diastolic 64 65 64  Pulse 64 63 64    1. General:  in No  Acute distress   Chronically ill -appearing 2. Psychological: Alert and   Oriented 3. Head/ENT:   Dry Mucous Membranes                          Head Non traumatic, neck supple                         Poor Dentition 4. SKIN: normal Skin turgor,  Skin clean Dry and intact no rash    5. Heart: Regular rate and rhythm no  Murmur, no Rub or gallop 6. Lungs: no wheezes or crackles   7. Abdomen: Soft,  non-tender, Non distended   obese  bowel sounds  present 8. Lower extremities: no clubbing, cyanosis, no  edema 9. Neurologically Grossly intact, moving all 4 extremities equally  10. MSK: Normal range of motion    Chart has been reviewed  ______________________________________________________________________________________________  Assessment/Plan 89 y.o. male with medical history significant of recurrent UTI with resistant organisms including Pseudomonas, bladder ca, bilateral carotid artery occlusion Hyperlipidemia, CKD stage IIIa, CAD with CABG, HTN, ICA stenosis history of CVA on Plavix, BPH, history of blood loss anemia small AVMs, diastolic CHF  Admitted for UTI     Present on Admission:  UTI (urinary tract infection)  Malignant neoplasm of bladder, unspecified (HCC)  CAD (coronary artery disease)  Hyperlipidemia  Essential  hypertension  Chronic diastolic CHF (congestive heart failure) (HCC)  CKD (chronic kidney disease), stage III (HCC)    Malignant neoplasm of bladder, unspecified (HCC) Followed by urology  CAD (coronary artery disease) Chronic stable continue Lipitor 40 mg a day aspirin 81 mg daily patient has not needed metoprolol for quite some time we will hold  Hyperlipidemia Continue Lipitor 40 mg a day  Essential hypertension Patient has not been using metoprolol we will continue to hold for now unless blood pressure elevates  UTI (urinary tract infection) History of Pseudomonas, Enterococcus faecalis and Proteus Mirabilis In the past has been treated with cefepime with good results. Resume for tonight Sent message to ID appreciate their help with discharge planning Urine culture currently pending   History of stroke Continue Plavix and Lipitor  Chronic diastolic CHF (congestive heart failure) (HCC) - currently appears to be slightly on the dry side, hold home diuretics for tonight and restart when appears euvolemic, carefuly follow fluid status and Cr   CKD (chronic kidney disease), stage III (HCC)  -chronic avoid nephrotoxic medications such as NSAIDs, Vanco Zosyn combo,  avoid hypotension, continue to follow renal function    Other plan as per orders.  DVT prophylaxis:  SCD     Code Status:    Code Status: Prior FULL CODE  as per patient   I had personally discussed CODE STATUS with patient and family  ACP   none    Family Communication:   Family   at  Bedside  plan of care was discussed  with   Daughter,    Diet heart healthy   Disposition Plan:       To home once workup is complete and patient is stable   Following barriers for discharge:                          able to transition to PO antibiotics                             Will need to be able to tolerate PO                                                       Will need consultants to evaluate patient prior to  discharge       Consult Orders  (From admission, onward)           Start     Ordered   10/19/23 2315  Consult to hospitalist  PAGED BY [facility]  Once       Provider:  (Not  yet assigned)  Question Answer Comment  Place call to: [facility]   Reason for Consult Admit      10/19/23 2314                                Consults called: Sent message to ID   Admission status:  ED Disposition     ED Disposition  Admit   Condition  --   Comment  Hospital Area: [facility] [[id number]]  Level of Care: Telemetry Medical [104]  May place patient in observation at [facility] or [facility] if equivalent level of care is available:: No  Covid Evaluation: Asymptomatic - no recent exposure (last 10 days) testing not required  Diagnosis: UTI (urinary tract infection) [161096]  Admitting Physician: Therisa Doyne [[id number]]  Attending Physician: Therisa Doyne [[id number]]           Obs     Level of care     tele  For 12H    Lab Results  Component Value Date   SARSCOV2NAA POSITIVE (A) 03/24/2021    Amanada Philbrick 10/20/2023, 1:02 AM    [facility]     after 2 AM please page floor coverage PA If 7AM-7PM, please contact the day team taking care of the patient using [email or web address]

## 2023-10-19 NOTE — ED Provider Triage Note (Signed)
 Emergency Medicine Provider Triage Evaluation Note  Ilya Neely , a 89 y.o. male  was evaluated in triage.  Pt complains of fatigue, sob, back pain, UTI. Daughter reports symptoms similar to previous low hemoglobin vs uti. Prone to both reportedly. SOB with exertion recently, can't walk more than a few feet.  Review of Systems  Positive: Fatigue, dysuria, sob Negative: Chest pain  Physical Exam  There were no vitals taken for this visit. Gen:   Awake, no distress   Resp:  Normal effort  MSK:   Moves extremities without difficulty  Other:    Medical Decision Making  Medically screening exam initiated at 3:07 PM.  Appropriate orders placed.  Hadrian Yarbrough was informed that the remainder of the evaluation will be completed by another provider, this initial triage assessment does not replace that evaluation, and the importance of remaining in the ED until their evaluation is complete.  Workup initiated in triage    Olene Floss, [place] 10/19/23 1511

## 2023-10-20 ENCOUNTER — Encounter (HOSPITAL_COMMUNITY): Payer: Self-pay | Admitting: Internal Medicine

## 2023-10-20 DIAGNOSIS — I714 Abdominal aortic aneurysm, without rupture, unspecified: Secondary | ICD-10-CM | POA: Diagnosis present

## 2023-10-20 DIAGNOSIS — I2510 Atherosclerotic heart disease of native coronary artery without angina pectoris: Secondary | ICD-10-CM

## 2023-10-20 DIAGNOSIS — Z6835 Body mass index (BMI) 35.0-35.9, adult: Secondary | ICD-10-CM | POA: Diagnosis not present

## 2023-10-20 DIAGNOSIS — I5032 Chronic diastolic (congestive) heart failure: Secondary | ICD-10-CM | POA: Diagnosis present

## 2023-10-20 DIAGNOSIS — Z881 Allergy status to other antibiotic agents status: Secondary | ICD-10-CM | POA: Diagnosis not present

## 2023-10-20 DIAGNOSIS — Z993 Dependence on wheelchair: Secondary | ICD-10-CM | POA: Diagnosis not present

## 2023-10-20 DIAGNOSIS — E782 Mixed hyperlipidemia: Secondary | ICD-10-CM | POA: Diagnosis not present

## 2023-10-20 DIAGNOSIS — N183 Chronic kidney disease, stage 3 unspecified: Secondary | ICD-10-CM | POA: Diagnosis present

## 2023-10-20 DIAGNOSIS — Z951 Presence of aortocoronary bypass graft: Secondary | ICD-10-CM | POA: Diagnosis not present

## 2023-10-20 DIAGNOSIS — Z8673 Personal history of transient ischemic attack (TIA), and cerebral infarction without residual deficits: Secondary | ICD-10-CM

## 2023-10-20 DIAGNOSIS — Z9221 Personal history of antineoplastic chemotherapy: Secondary | ICD-10-CM | POA: Diagnosis not present

## 2023-10-20 DIAGNOSIS — Z8249 Family history of ischemic heart disease and other diseases of the circulatory system: Secondary | ICD-10-CM | POA: Diagnosis not present

## 2023-10-20 DIAGNOSIS — Z96642 Presence of left artificial hip joint: Secondary | ICD-10-CM | POA: Diagnosis present

## 2023-10-20 DIAGNOSIS — N390 Urinary tract infection, site not specified: Secondary | ICD-10-CM | POA: Diagnosis present

## 2023-10-20 DIAGNOSIS — C679 Malignant neoplasm of bladder, unspecified: Secondary | ICD-10-CM | POA: Diagnosis present

## 2023-10-20 DIAGNOSIS — Z1623 Resistance to quinolones and fluoroquinolones: Secondary | ICD-10-CM | POA: Diagnosis present

## 2023-10-20 DIAGNOSIS — I130 Hypertensive heart and chronic kidney disease with heart failure and stage 1 through stage 4 chronic kidney disease, or unspecified chronic kidney disease: Secondary | ICD-10-CM | POA: Diagnosis present

## 2023-10-20 DIAGNOSIS — N1831 Chronic kidney disease, stage 3a: Secondary | ICD-10-CM | POA: Diagnosis not present

## 2023-10-20 DIAGNOSIS — N12 Tubulo-interstitial nephritis, not specified as acute or chronic: Secondary | ICD-10-CM | POA: Diagnosis present

## 2023-10-20 DIAGNOSIS — B965 Pseudomonas (aeruginosa) (mallei) (pseudomallei) as the cause of diseases classified elsewhere: Secondary | ICD-10-CM | POA: Diagnosis present

## 2023-10-20 DIAGNOSIS — Z7189 Other specified counseling: Secondary | ICD-10-CM | POA: Diagnosis not present

## 2023-10-20 DIAGNOSIS — E66812 Obesity, class 2: Secondary | ICD-10-CM | POA: Diagnosis present

## 2023-10-20 DIAGNOSIS — Z79899 Other long term (current) drug therapy: Secondary | ICD-10-CM | POA: Diagnosis not present

## 2023-10-20 DIAGNOSIS — K219 Gastro-esophageal reflux disease without esophagitis: Secondary | ICD-10-CM | POA: Diagnosis present

## 2023-10-20 DIAGNOSIS — N3001 Acute cystitis with hematuria: Secondary | ICD-10-CM | POA: Diagnosis not present

## 2023-10-20 DIAGNOSIS — I358 Other nonrheumatic aortic valve disorders: Secondary | ICD-10-CM | POA: Diagnosis present

## 2023-10-20 DIAGNOSIS — E538 Deficiency of other specified B group vitamins: Secondary | ICD-10-CM | POA: Diagnosis present

## 2023-10-20 DIAGNOSIS — N1832 Chronic kidney disease, stage 3b: Secondary | ICD-10-CM | POA: Diagnosis present

## 2023-10-20 DIAGNOSIS — N400 Enlarged prostate without lower urinary tract symptoms: Secondary | ICD-10-CM | POA: Diagnosis present

## 2023-10-20 DIAGNOSIS — I10 Essential (primary) hypertension: Secondary | ICD-10-CM | POA: Diagnosis not present

## 2023-10-20 DIAGNOSIS — E785 Hyperlipidemia, unspecified: Secondary | ICD-10-CM | POA: Diagnosis present

## 2023-10-20 DIAGNOSIS — D509 Iron deficiency anemia, unspecified: Secondary | ICD-10-CM | POA: Diagnosis present

## 2023-10-20 LAB — COMPREHENSIVE METABOLIC PANEL WITH GFR
ALT: 12 U/L (ref 0–44)
AST: 15 U/L (ref 15–41)
Albumin: 3 g/dL — ABNORMAL LOW (ref 3.5–5.0)
Alkaline Phosphatase: 54 U/L (ref 38–126)
Anion gap: 7 (ref 5–15)
BUN: 17 mg/dL (ref 8–23)
CO2: 22 mmol/L (ref 22–32)
Calcium: 8.1 mg/dL — ABNORMAL LOW (ref 8.9–10.3)
Chloride: 110 mmol/L (ref 98–111)
Creatinine, Ser: 1.36 mg/dL — ABNORMAL HIGH (ref 0.61–1.24)
GFR, Estimated: 49 mL/min — ABNORMAL LOW (ref >60)
Glucose, Bld: 91 mg/dL (ref 70–99)
Potassium: 3.7 mmol/L (ref 3.5–5.1)
Sodium: 139 mmol/L (ref 135–145)
Total Bilirubin: 0.5 mg/dL (ref 0.0–1.2)
Total Protein: 5.8 g/dL — ABNORMAL LOW (ref 6.5–8.1)

## 2023-10-20 LAB — FERRITIN: Ferritin: 44 ng/mL (ref 24–336)

## 2023-10-20 LAB — FOLATE: Folate: 9.7 ng/mL (ref >5.9)

## 2023-10-20 LAB — RETICULOCYTES
Immature Retic Fract: 23.3 % — ABNORMAL HIGH (ref 2.3–15.9)
RBC.: 3.69 MIL/uL — ABNORMAL LOW (ref 4.22–5.81)
Retic Count, Absolute: 95.2 10*3/uL (ref 19.0–186.0)
Retic Ct Pct: 2.6 % (ref 0.4–3.1)

## 2023-10-20 LAB — IRON AND TIBC
Iron: 26 ug/dL — ABNORMAL LOW (ref 45–182)
Saturation Ratios: 8 % — ABNORMAL LOW (ref 17.9–39.5)
TIBC: 311 ug/dL (ref 250–450)
UIBC: 285 ug/dL

## 2023-10-20 LAB — CBC
HCT: 31.8 % — ABNORMAL LOW (ref 39.0–52.0)
Hemoglobin: 10.1 g/dL — ABNORMAL LOW (ref 13.0–17.0)
MCH: 30.1 pg (ref 26.0–34.0)
MCHC: 31.8 g/dL (ref 30.0–36.0)
MCV: 94.9 fL (ref 80.0–100.0)
Platelets: 148 10*3/uL — ABNORMAL LOW (ref 150–400)
RBC: 3.35 MIL/uL — ABNORMAL LOW (ref 4.22–5.81)
RDW: 15.1 % (ref 11.5–15.5)
WBC: 8.1 10*3/uL (ref 4.0–10.5)
nRBC: 0 % (ref 0.0–0.2)

## 2023-10-20 LAB — PHOSPHORUS
Phosphorus: 3.5 mg/dL (ref 2.5–4.6)
Phosphorus: 3.3 mg/dL (ref 2.5–4.6)

## 2023-10-20 LAB — MAGNESIUM
Magnesium: 2.1 mg/dL (ref 1.7–2.4)
Magnesium: 2 mg/dL (ref 1.7–2.4)

## 2023-10-20 LAB — VITAMIN B12: Vitamin B-12: 202 pg/mL (ref 180–914)

## 2023-10-20 LAB — CK: Total CK: 38 U/L — ABNORMAL LOW (ref 49–397)

## 2023-10-20 MED ORDER — ONDANSETRON HCL 4 MG PO TABS: 4.0000 mg | ORAL_TABLET | Freq: Four times a day (QID) | ORAL | Status: DC | PRN

## 2023-10-20 MED ORDER — ALFUZOSIN HCL ER 10 MG PO TB24
10.0000 mg | ORAL_TABLET | Freq: Every day | ORAL | Status: DC
  Administered 2023-10-20 – 2023-10-22 (×3): 10 mg via ORAL
  Filled 2023-10-20 (×4): qty 1

## 2023-10-20 MED ORDER — SODIUM CHLORIDE 0.9 % IV SOLN
1.0000 g | INTRAVENOUS | Status: DC
  Administered 2023-10-20 – 2023-10-23 (×4): 1 g via INTRAVENOUS
  Filled 2023-10-20 (×5): qty 10

## 2023-10-20 MED ORDER — HYDROCODONE-ACETAMINOPHEN 5-325 MG PO TABS: 1.0000 | ORAL_TABLET | ORAL | Status: DC | PRN

## 2023-10-20 MED ORDER — CLOPIDOGREL BISULFATE 75 MG PO TABS
75.0000 mg | ORAL_TABLET | Freq: Every day | ORAL | Status: DC
Start: 2023-10-20 — End: 2023-10-23
  Administered 2023-10-20 – 2023-10-23 (×4): 75 mg via ORAL
  Filled 2023-10-20 (×4): qty 1

## 2023-10-20 MED ORDER — ONDANSETRON HCL 4 MG/2ML IJ SOLN
4.0000 mg | Freq: Four times a day (QID) | INTRAMUSCULAR | Status: DC | PRN
Start: 2023-10-20 — End: 2023-10-23

## 2023-10-20 MED ORDER — CYANOCOBALAMIN 1000 MCG/ML IJ SOLN
1000.0000 ug | Freq: Every day | INTRAMUSCULAR | Status: AC
  Administered 2023-10-20 – 2023-10-21 (×2): 1000 ug via INTRAMUSCULAR
  Filled 2023-10-20 (×2): qty 1

## 2023-10-20 MED ORDER — IRON SUCROSE 300 MG IVPB - SIMPLE MED
300.0000 mg | Freq: Once | Status: AC
  Administered 2023-10-20: 300 mg via INTRAVENOUS
  Filled 2023-10-20: qty 300

## 2023-10-20 MED ORDER — ACETAMINOPHEN 325 MG PO TABS: 650.0000 mg | ORAL_TABLET | Freq: Four times a day (QID) | ORAL | Status: DC | PRN

## 2023-10-20 MED ORDER — ASPIRIN 81 MG PO TBEC
81.0000 mg | DELAYED_RELEASE_TABLET | Freq: Every day | ORAL | Status: DC
  Administered 2023-10-20 – 2023-10-23 (×4): 81 mg via ORAL
  Filled 2023-10-20 (×4): qty 1

## 2023-10-20 MED ORDER — ATORVASTATIN CALCIUM 40 MG PO TABS
40.0000 mg | ORAL_TABLET | Freq: Every day | ORAL | Status: DC
  Administered 2023-10-20 – 2023-10-22 (×3): 40 mg via ORAL
  Filled 2023-10-20 (×3): qty 1

## 2023-10-20 MED ORDER — SODIUM CHLORIDE 0.9 % IV SOLN: INTRAVENOUS | Status: DC

## 2023-10-20 MED ORDER — PANTOPRAZOLE SODIUM 40 MG PO TBEC
40.0000 mg | DELAYED_RELEASE_TABLET | Freq: Every day | ORAL | Status: DC
  Administered 2023-10-20 – 2023-10-23 (×4): 40 mg via ORAL
  Filled 2023-10-20 (×4): qty 1

## 2023-10-20 MED ORDER — ACETAMINOPHEN 650 MG RE SUPP: 650.0000 mg | Freq: Four times a day (QID) | RECTAL | Status: DC | PRN

## 2023-10-20 MED ORDER — VITAMIN B-12 1000 MCG PO TABS
1000.0000 ug | ORAL_TABLET | Freq: Every day | ORAL | Status: DC
  Administered 2023-10-22 – 2023-10-23 (×2): 1000 ug via ORAL
  Filled 2023-10-20 (×2): qty 1

## 2023-10-20 MED ORDER — FERROUS GLUCONATE 324 (38 FE) MG PO TABS
324.0000 mg | ORAL_TABLET | Freq: Two times a day (BID) | ORAL | Status: DC
  Administered 2023-10-20 – 2023-10-23 (×7): 324 mg via ORAL
  Filled 2023-10-20 (×8): qty 1

## 2023-10-20 NOTE — Assessment & Plan Note (Signed)
-  chronic avoid nephrotoxic medications such as NSAIDs, Vanco Zosyn combo,  avoid hypotension, continue to follow renal function

## 2023-10-20 NOTE — Assessment & Plan Note (Addendum)
 History of Pseudomonas, Enterococcus faecalis and Proteus Mirabilis In the past has been treated with cefepime with good results. Resume for tonight Sent message to ID appreciate their help with discharge planning Urine culture currently pending

## 2023-10-20 NOTE — Progress Notes (Signed)
 PROGRESS NOTE  Tyler [name] NFA:[id number] DOB: [birth year]   PCP: Sondra Come, MD  Patient is from: Home.  Lives with Wife.  Uses rolling walker  DOA: 10/19/2023 LOS: 0  Chief complaints Chief Complaint  Patient presents with   Fatigue   Shortness of Breath   Back Pain   UTI   Dysuria     Brief Narrative / Interim history: 89 year old M with PMH of bladder cancer s/p resection, recurrent UTI, CAD/CABG in 2014, diastolic CHF, CKD-3B, CVA, AAA s/p stent grafting in 2015, right carotid artery occlusion and severe left ICA stenosis followed by vascular, anemia and GI bleed/AVM presented to ED with shortness of breath, fatigue, malaise, dysuria and flank pain, and admitted with working diagnosis of complicated UTI.   In ED, normal vitals. Cr 1.47.  BUN 17.  WBC 8.2.  Hgb 11.1.  UA with large LE, positive nitrites, few bacteria and moderate Hgb without significant RBC.  CT renal stone study without acute finding.  Urine culture obtained.  Started on IV cefepime based on previous urine culture results.  ID consulted.  Subjective: Seen and examined this afternoon.  No major events overnight of this morning.  Reports feeling better but not able to provide details.  He does not recall the symptoms he had when he came to the hospital.  Now he denies dysuria or pain.  He has chronic urgency.  Patient's daughter at bedside.  Objective: Vitals:   10/20/23 0200 10/20/23 0233 10/20/23 0635 10/20/23 0740  BP: (!) 105/57 (!) 111/59 133/69 (!) 132/56  Pulse: 73 88 78 67  Resp: 20 16 18 16   Temp:  99.6 F (37.6 C) 97.7 F (36.5 C) (!) 97.4 F (36.3 C)  TempSrc:  Oral Oral Oral  SpO2: 98% 98% 97% 98%  Weight:      Height:        Examination:  GENERAL: No apparent distress.  Nontoxic. HEENT: MMM.  Vision and hearing grossly intact.  NECK: Supple.  No apparent JVD.  RESP:  No IWOB.  Fair aeration bilaterally. CVS:  RRR. Heart sounds normal.  ABD/GI/GU: BS+. Abd soft, NTND.  MSK/EXT:   Moves extremities. No apparent deformity. No edema.  SKIN: no apparent skin lesion or wound NEURO: Awake, alert and oriented appropriately.  Good sense of humor.  No apparent focal neuro deficit. PSYCH: Calm. Normal affect.   Consultants:  Infectious disease  Procedures: None  Microbiology summarized: Urine culture pending  Assessment and plan: Complicated UTI in a patient with history of recurrent UTI and bladder cancer s/p resection: Presents with malaise, dysuria, flank pain.  No fever or leukocytosis.  UA consistent with UTI.  CT renal stone study without acute finding. History of Pseudomonas, Enterococcus faecalis and Proteus Mirabilis -Continue IV cefepime based on previous urine cultures -Follow urine culture -Follow ID recommendation  Malignant neoplasm of bladder s/p resection.  CT renal stone study without acute finding. -Outpatient follow-up with urology -Resume home Uroxatral   History of CAD/CABG in 2014: Stable.  No anginal symptoms.  Does not take Toprol-XL. -Continue home statin, Plavix and aspirin  Chronic diastolic CHF: In 4696 with LVEF of 60 to 65%, G1 DD.  Takes Lasix as needed at home.  Appears euvolemic on exam. -Monitor fluid status and respiratory status  CKD-3A: Cr better than prior values. Recent Labs    12/18/22 1624 10/19/23 1507 10/20/23 1044  BUN 25* 17 17  CREATININE 1.91* 1.47* 1.36*  -Follow morning labs  Iron deficiency anemia: Slight drop  in Hgb likely dilutional.  On p.o. iron at home.  Had dark stool attributed to iron.  Patient's daughter denies hematuria or hematochezia.  Anemia panel with some iron deficiency and B12 deficiency.  He is on Protonix Recent Labs    12/18/22 1624 10/19/23 1507 10/20/23 1044  HGB 12.5* 11.1* 10.1*  -IV Venofer 300 mg x 1 -Vitamin B12 injection 1000 mcg daily x 2 followed by p.o. -Continue monitoring  Essential hypertension: Normotensive for most part.  Does not take medication on daily  basis.  History of GI bleed/history of AVM: on DAPT likely due to severe carotid artery stenosis.  Reports dark stool that they attributed to iron.  No hematuria or hematochezia. -Continue Protonix  History of stroke/right carotid occlusion/severe left carotid artery stenosis -Deferred TCAR in 2022 -Continue Plavix, aspirin and statin  AAA s/p graft: CT renal stone study showed stable aortic stent graft with residual aneurysm sac. -Statin, Plavix and aspirin as above   Hyperlipidemia -Continue Lipitor 40 mg a day   Class II obesity Body mass index is 35.22 kg/m.          DVT prophylaxis:  SCDs Start: 10/20/23 0253  Code Status: Full code Family Communication: Updated patient's daughter at bedside Level of care: Med-Surg Status is: Observation The patient will require care spanning > 2 midnights and should be moved to inpatient because: Complicated UTI   Final disposition: Home   55 minutes with more than 50% spent in reviewing records, counseling patient/family and coordinating care.   Sch Meds:  Scheduled Meds:  alfuzosin  10 mg Oral QHS   aspirin EC  81 mg Oral Daily   atorvastatin  40 mg Oral QHS   clopidogrel  75 mg Oral Daily   cyanocobalamin  1,000 mcg Intramuscular Daily   Followed by   Melene Muller ON 10/22/2023] vitamin B-12  1,000 mcg Oral Daily   ferrous gluconate  324 mg Oral BID WC   pantoprazole  40 mg Oral Daily   Continuous Infusions:  ceFEPime (MAXIPIME) IV 1 g (10/20/23 0447)   iron sucrose     PRN Meds:.acetaminophen **OR** acetaminophen, HYDROcodone-acetaminophen, ondansetron **OR** ondansetron (ZOFRAN) IV  Antimicrobials: Anti-infectives (From admission, onward)    Start     Dose/Rate Route Frequency Ordered Stop   10/20/23 0200  ceFEPIme (MAXIPIME) 1 g in sodium chloride 0.9 % 100 mL IVPB        1 g 200 mL/hr over 30 Minutes Intravenous Every 24 hours 10/20/23 0145     10/20/23 0000  cefpodoxime (VANTIN) 200 MG tablet        200 mg Oral  2 times daily 10/19/23 2151 10/30/23 2359   10/19/23 1815  piperacillin-tazobactam (ZOSYN) IVPB 3.375 g        3.375 g 100 mL/hr over 30 Minutes Intravenous  Once 10/19/23 1808 10/19/23 2124   10/19/23 1800  cefTRIAXone (ROCEPHIN) 1 g in sodium chloride 0.9 % 100 mL IVPB  Status:  Discontinued        1 g 200 mL/hr over 30 Minutes Intravenous  Once 10/19/23 1757 10/19/23 1758        I have personally reviewed the following labs and images: CBC: Recent Labs  Lab 10/19/23 1507 10/20/23 1044  WBC 8.2 8.1  HGB 11.1* 10.1*  HCT 34.3* 31.8*  MCV 94.0 94.9  PLT 172 148*   BMP &GFR Recent Labs  Lab 10/19/23 1507 10/19/23 2028 10/20/23 1044  NA 140  --  139  K 3.8  --  3.7  CL 108  --  110  CO2 22  --  22  GLUCOSE 112*  --  91  BUN 17  --  17  CREATININE 1.47*  --  1.36*  CALCIUM 8.5*  --  8.1*  MG  --  2.1 2.0  PHOS  --  3.5 3.3   Estimated Creatinine Clearance: 39.5 mL/min (A) (by C-G formula based on SCr of 1.36 mg/dL (H)). Liver & Pancreas: Recent Labs  Lab 10/19/23 1507 10/20/23 1044  AST 17 15  ALT 10 12  ALKPHOS 58 54  BILITOT 0.7 0.5  PROT 6.4* 5.8*  ALBUMIN 3.3* 3.0*   No results for input(s): "LIPASE", "AMYLASE" in the last 168 hours. No results for input(s): "AMMONIA" in the last 168 hours. Diabetic: No results for input(s): "HGBA1C" in the last 72 hours. No results for input(s): "GLUCAP" in the last 168 hours. Cardiac Enzymes: Recent Labs  Lab 10/19/23 2028  CKTOTAL 38*   No results for input(s): "PROBNP" in the last 8760 hours. Coagulation Profile: No results for input(s): "INR", "PROTIME" in the last 168 hours. Thyroid Function Tests: No results for input(s): "TSH", "T4TOTAL", "FREET4", "T3FREE", "THYROIDAB" in the last 72 hours. Lipid Profile: No results for input(s): "CHOL", "HDL", "LDLCALC", "TRIG", "CHOLHDL", "LDLDIRECT" in the last 72 hours. Anemia Panel: Recent Labs    10/19/23 1514 10/19/23 2028 10/20/23 1044  VITAMINB12  --    --  202  FOLATE  --   --  9.7  FERRITIN  --  44  --   TIBC  --  311  --   IRON  --  26*  --   RETICCTPCT 2.6  --   --    Urine analysis:    Component Value Date/Time   COLORURINE YELLOW 10/19/2023 1508   APPEARANCEUR CLOUDY (A) 10/19/2023 1508   LABSPEC 1.014 10/19/2023 1508   PHURINE 5.0 10/19/2023 1508   GLUCOSEU NEGATIVE 10/19/2023 1508   HGBUR MODERATE (A) 10/19/2023 1508   BILIRUBINUR NEGATIVE 10/19/2023 1508   KETONESUR NEGATIVE 10/19/2023 1508   PROTEINUR NEGATIVE 10/19/2023 1508   UROBILINOGEN 0.2 08/22/2014 1559   NITRITE POSITIVE (A) 10/19/2023 1508   LEUKOCYTESUR LARGE (A) 10/19/2023 1508   Sepsis Labs: Invalid input(s): "PROCALCITONIN", "LACTICIDVEN"  Microbiology: No results found for this or any previous visit (from the past 240 hours).  Radiology Studies: CT Renal Stone Study Result Date: 10/19/2023 CLINICAL DATA:  Flank pain EXAM: CT ABDOMEN AND PELVIS WITHOUT CONTRAST TECHNIQUE: Multidetector CT imaging of the abdomen and pelvis was performed following the standard protocol without IV contrast. RADIATION DOSE REDUCTION: This exam was performed according to the departmental dose-optimization program which includes automated exposure control, adjustment of the mA and/or kV according to patient size and/or use of iterative reconstruction technique. COMPARISON:  02/03/2022 FINDINGS: Lower chest: Mild atelectatic changes are noted in the left base. Minimal left pleural effusion is seen. Hepatobiliary: No focal liver abnormality is seen. No gallstones, gallbladder wall thickening, or biliary dilatation. Pancreas: Unremarkable. No pancreatic ductal dilatation or surrounding inflammatory changes. Spleen: Normal in size without focal abnormality. Adrenals/Urinary Tract: Adrenal glands are within normal limits. Kidneys are well visualized bilaterally. No renal calculi or obstructive changes are seen. The ureters are within normal limits. Bladder is partially distended.  Stomach/Bowel: No obstructive or inflammatory changes of the colon are noted. The appendix is within normal limits. Small bowel and stomach are unremarkable. Vascular/Lymphatic: Atherosclerotic calcifications of the aorta are noted. Aortic stent graft is seen in satisfactory position.  The aneurysm sac is stable at 6.2 cm. The lack of contrast precludes evaluation for endoleak although no significant enlargement of the sac is identified to suggest endoleak. No lymphadenopathy is noted. Reproductive: Prostate is unremarkable. Other: No abdominal wall hernia or abnormality. No abdominopelvic ascites. Musculoskeletal: Left hip replacement is noted. Degenerative changes of lumbar spine are seen. IMPRESSION: Stable aortic stent graft with residual aneurysm sac. No focal abnormality to correspond with the given clinical history is noted. Mild left basilar atelectasis. Electronically Signed   By: Alcide Clever M.D.   On: 10/19/2023 21:43   DG Chest 1 View Result Date: 10/19/2023 CLINICAL DATA:  Acute shortness of breath. EXAM: CHEST  1 VIEW COMPARISON:  August 17, 2022. FINDINGS: The heart size and mediastinal contours are within normal limits. Status post coronary artery bypass graft. Both lungs are clear. The visualized skeletal structures are unremarkable. IMPRESSION: No active disease. Electronically Signed   By: Lupita Raider M.D.   On: 10/19/2023 16:02      Laquasia Pincus T. Alya Smaltz [facility]  If 7PM-7AM, please contact night-coverage [email or web address] 10/20/2023, 1:52 PM

## 2023-10-20 NOTE — TOC CM/SW Note (Addendum)
 Notified [facility] admission   PCP Dr Janece Canterbury  [facility] [phone] ext [phone]

## 2023-10-20 NOTE — Progress Notes (Signed)
 Pharmacy Antibiotic Note  Tyler [name] is a 89 y.o. male admitted on 10/19/2023 with recurrent UTI. PMH includes h/o bladder cancer (previously receiving intra-vesicular BCG until 06/2023) and recent UTI showing Pseudomonas (prev tx with cefepime). Pharmacy has been consulted for Cefepime dosing.  -Zosyn 3.375gm IV x1 -Most recent Ucx (Proteus Mirabilis + PSA-Rx to FQs)  -UA: Nitrite positive, Leukocyte large, WBC >50  Plan: -Cefepime 1g IV every 24 hours -Monitor renal function -Follow up signs of clinical improvement, LOT, de-escalation of antibiotics  -Follow up ID consult    Temp (24hrs), Avg:97.8 F (36.6 C), Min:97.7 F (36.5 C), Max:97.9 F (36.6 C)  Recent Labs  Lab 10/19/23 1507  WBC 8.2  CREATININE 1.47*    CrCl cannot be calculated (Unknown ideal weight.).    Allergies  Allergen Reactions   Bactrim [Sulfamethoxazole-Trimethoprim] Itching and Rash    Antimicrobials this admission: Cefepime 3/28 >>   Microbiology results: 3/27 UCx:    Thank you for allowing pharmacy to be a part of this patient's care.  Arabella Merles, PharmD. Clinical Pharmacist 10/20/2023 1:45 AM

## 2023-10-20 NOTE — Assessment & Plan Note (Addendum)
 Chronic stable continue Lipitor 40 mg a day aspirin 81 mg daily patient has not needed metoprolol for quite some time we will hold

## 2023-10-20 NOTE — Evaluation (Signed)
 Occupational Therapy Evaluation Patient Details Name: Tyler [name] MRN: [id number] DOB: [birth year] Today's Date: 10/20/2023   History of Present Illness   Pt is a 89 y/o male who presents 10/19/2023 with AMS, back pain, and straining to urinate. Pt admitted with complicated UTI. PMH significant for AAA, CAD, CVA, HTN, OA, CABG, L THA.     Clinical Impressions Patient admitted for the diagnosis above. PTA he lives at home with his spouse, uses a wheelchair throughout the day, but is still able to perform stand transfers at [facility] level.  Patient endorses completing his own bathing, but his spouse does assist with lower body dressing.  In addition, they do have 5x/wk PCA assist for am meals and showers.  Patient is interested in increasing the hours for his PCA assist as home to also include early evening hours.  OT will continue efforts in the acute setting, and HH OT can be considered.  Patient has the needed DME at home.        If plan is discharge home, recommend the following:   Assist for transportation;A lot of help with bathing/dressing/bathroom;A lot of help with walking and/or transfers     Functional Status Assessment   Patient has had a recent decline in their functional status and demonstrates the ability to make significant improvements in function in a reasonable and predictable amount of time.     Equipment Recommendations   None recommended by OT     Recommendations for Other Services         Precautions/Restrictions   Precautions Precautions: Fall Restrictions Weight Bearing Restrictions Per Provider Order: No     Mobility Bed Mobility               General bed mobility comments: Remains in wheelchair    Transfers Overall transfer level: Needs assistance Equipment used: Rolling walker (2 wheels) Transfers: Bed to chair/wheelchair/BSC       Step pivot transfers: Mod assist            Balance Overall balance assessment: Needs  assistance Sitting-balance support: Feet supported, No upper extremity supported Sitting balance-Leahy Scale: Good     Standing balance support: Reliant on assistive device for balance Standing balance-Leahy Scale: Poor                             ADL either performed or assessed with clinical judgement   ADL   Eating/Feeding: Set up;Sitting   Grooming: Set up;Sitting       Lower Body Bathing: Moderate assistance       Lower Body Dressing: Moderate assistance;Sit to/from stand   Toilet Transfer: Moderate assistance;Regular Toilet;Stand-pivot                   Vision Patient Visual Report: No change from baseline       Perception Perception: Not tested       Praxis Praxis: Not tested       Pertinent Vitals/Pain Pain Assessment Pain Assessment: No/denies pain     Extremity/Trunk Assessment Upper Extremity Assessment Upper Extremity Assessment: Generalized weakness   Lower Extremity Assessment Lower Extremity Assessment: Defer to PT evaluation   Cervical / Trunk Assessment Cervical / Trunk Assessment: Kyphotic Cervical / Trunk Exceptions: Forward head posture with rounded shoulders   Communication Communication Communication: No apparent difficulties Factors Affecting Communication: Hearing impaired   Cognition Arousal: Alert Behavior During Therapy: WFL for tasks assessed/performed Cognition: No apparent impairments  Following commands: Intact       Cueing  General Comments   Cueing Techniques: Verbal cues;Gestural cues   VSS on RA   Exercises     Shoulder Instructions      Home Living Family/patient expects to be discharged to:: Private residence Living Arrangements: Spouse/significant other Available Help at Discharge: Family;Available 24 hours/day Type of Home: Other(Comment) (2 story Condo) Home Access: Level entry     Home Layout: Two level;Able to live on main level  with bedroom/bathroom Alternate Level Stairs-Number of Steps: flight - patient unable to ascend   Bathroom Shower/Tub: Producer, television/film/video: Standard Bathroom Accessibility: Yes How Accessible: Accessible via walker Home Equipment: Educational psychologist (2 wheels);BSC/3in1;Grab bars - tub/shower;Wheelchair - manual;Shower seat;Lift chair          Prior Functioning/Environment Prior Level of Function : Needs assist       Physical Assist : ADLs (physical)   ADLs (physical): Dressing Mobility Comments: Amb short household distances with RW, primarily bed to chair and the bathroom. Pt is in the wheelchair for most of the day. ADLs Comments: Pt enters his walk-in shower and bathes without assistance utilizing shower chair. Pt can dress himself, except wife assists to don socks. Pt can don slip-on shoes without assist.    OT Problem List: Decreased strength;Decreased activity tolerance;Impaired balance (sitting and/or standing)   OT Treatment/Interventions: Self-care/ADL training;Therapeutic activities;Balance training;DME and/or AE instruction;Patient/family education      OT Goals(Current goals can be found in the care plan section)   Acute Rehab OT Goals Patient Stated Goal: Return home OT Goal Formulation: With patient Time For Goal Achievement: 11/03/23 Potential to Achieve Goals: Good ADL Goals Pt Will Perform Lower Body Bathing: with set-up;sit to/from stand Pt Will Perform Lower Body Dressing: with min assist;sit to/from stand Pt Will Transfer to Toilet: with modified independence;stand pivot transfer;regular height toilet   OT Frequency:  Min 1X/week    Co-evaluation              AM-PAC OT "6 Clicks" Daily Activity     Outcome Measure Help from another person eating meals?: A Little Help from another person taking care of personal grooming?: A Little Help from another person toileting, which includes using toliet, bedpan, or urinal?: A  Lot Help from another person bathing (including washing, rinsing, drying)?: A Lot Help from another person to put on and taking off regular upper body clothing?: A Little Help from another person to put on and taking off regular lower body clothing?: A Lot 6 Click Score: 15   End of Session Equipment Utilized During Treatment: Gait belt;Rolling walker (2 wheels) Nurse Communication: Mobility status  Activity Tolerance: Patient tolerated treatment well Patient left: in chair;with call bell/phone within reach;with family/visitor present  OT Visit Diagnosis: Muscle weakness (generalized) (M62.81)                Time: 1510-1531 OT Time Calculation (min): 21 min Charges:  OT General Charges $OT Visit: 1 Visit OT Evaluation $OT Eval Moderate Complexity: 1 Mod  10/20/2023  RP, OTR/L  Acute Rehabilitation Services  Office:  [phone]   Suzanna Obey 10/20/2023, 3:38 PM

## 2023-10-20 NOTE — TOC Initial Note (Addendum)
 Transition of Care (TOC) - Initial/Assessment Note   Spoke to patient and wife Marylu Lund at bedside.   Patient from home with wife.   Patient has aide 5 days a week for 3 hours a day through [facility] in [place] ( per [place] [employer] ) . NCM spoke to [place] at [facility] to get name of aide service and to notify her NCM emailed  orders for HHPT and [facility] to [facility] to [place] . [facility]   Patient had [facility] and would like them again. [facility] accepted referral   Patient has wheelchair and walker at home  Patient Details  Name: Tyler [name] MRN: [id number] Date of Birth: 1929/07/22  Transition of Care [facility]) CM/SW Contact:    Kingsley Plan, RN Phone Number: 10/20/2023, 3:16 PM  Clinical Narrative:                   Expected Discharge Plan: Home w Home Health Services Barriers to Discharge: Continued Medical Work up   Patient Goals and CMS Choice Patient states their goals for this hospitalization and ongoing recovery are:: to return to home CMS Medicare.gov Compare Post Acute Care list provided to:: Patient Choice offered to / list presented to : Patient, Spouse      Expected Discharge Plan and Services   Discharge Planning Services: CM Consult Post Acute Care Choice: Home Health Living arrangements for the past 2 months: Single Family Home                 DME Arranged: N/A         HH Arranged: PT HH Agency: [facility] ([facility]) Date HH Agency Contacted: 10/20/23 Time HH Agency Contacted: 1515 Representative spoke with at [facility] Agency: Adele Dan  Prior Living Arrangements/Services Living arrangements for the past 2 months: Single Family Home Lives with:: Spouse Patient language and need for interpreter reviewed:: Yes Do you feel safe going back to the place where you live?: Yes      Need for Family Participation in Patient Care: Yes (Comment) Care giver support system in place?: Yes (comment) Current home  services: DME, Homehealth aide Criminal Activity/Legal Involvement Pertinent to Current Situation/Hospitalization: No - Comment as needed  Activities of Daily Living   ADL Screening (condition at time of admission) Independently performs ADLs?: No Does the patient have a NEW difficulty with bathing/dressing/toileting/self-feeding that is expected to last >3 days?: No Does the patient have a NEW difficulty with getting in/out of bed, walking, or climbing stairs that is expected to last >3 days?: No Does the patient have a NEW difficulty with communication that is expected to last >3 days?: No Is the patient deaf or have difficulty hearing?: Yes Does the patient have difficulty seeing, even when wearing glasses/contacts?: No Does the patient have difficulty concentrating, remembering, or making decisions?: Yes  Permission Sought/Granted   Permission granted to share information with : Yes, Verbal Permission Granted  Share Information with NAME: wife Handy Mcloud  Permission granted to share info w [facility]        Emotional Assessment Appearance:: Appears stated age Attitude/Demeanor/Rapport: Engaged Affect (typically observed): Appropriate Orientation: : Oriented to Self, Oriented to Place, Oriented to  Time, Oriented to Situation Alcohol / Substance Use: Not Applicable Psych Involvement: No (comment)  Admission diagnosis:  UTI (urinary tract infection) [N39.0] Pyelonephritis [N12] Complicated UTI (urinary tract infection) [N39.0] Patient Active Problem List   Diagnosis Date Noted   History  of stroke 10/20/2023   Chronic diastolic CHF (congestive heart failure) (HCC) 10/20/2023   CKD (chronic kidney disease), stage III (HCC) 10/20/2023   Complicated UTI (urinary tract infection) 10/20/2023   UTI (urinary tract infection) 10/19/2023   Benign neoplasm of ascending colon    Fecal occult blood test positive    GI bleed 02/25/2022   Gross hematuria 02/25/2022    Malignant neoplasm of bladder, unspecified (HCC) 02/25/2022   Symptomatic anemia 02/25/2022   ABLA (acute blood loss anemia) 02/24/2022   Physical deconditioning 03/25/2021   Transient ischemic attack (TIA) 03/25/2021   Transient ischemic attack 03/24/2021   Stenosis of right carotid artery    Arthritis of left hip 09/01/2014   AAA (abdominal aortic aneurysm) (HCC)    Essential hypertension    CAD (coronary artery disease)    S/P CABG x 4 06/27/2013   Hyperlipidemia    PCP:  Sondra Come, MD Pharmacy:   [facility] - [place], [place] - [address] [place] [address] Phone: [phone] Fax: [phone]     Social Drivers of Health (SDOH) Social History: SDOH Screenings   Food Insecurity: No Food Insecurity (10/20/2023)  Housing: Low Risk  (10/20/2023)  Transportation Needs: No Transportation Needs (10/20/2023)  Utilities: Not At Risk (10/20/2023)  Social Connections: Patient Declined (10/20/2023)  Tobacco Use: Low Risk  (10/20/2023)  Recent Concern: Tobacco Use - Medium Risk (08/21/2023)   Received from [facility]   SDOH Interventions:     Readmission Risk Interventions     No data to display

## 2023-10-20 NOTE — Assessment & Plan Note (Signed)
 Followed by urology.

## 2023-10-20 NOTE — Consult Note (Addendum)
 I have seen and examined the patient. I have personally reviewed the clinical findings, laboratory findings, microbiological data and imaging studies. The assessment and treatment plan was discussed with the Nurse Practitioner. I agree with her/his recommendations except following additions/corrections.  89 year old male with prior history of AAA s/p stent graft, bilateral carotid artery occlusion, CAD/CABG/V-fib arrest, ICA stenosis with CVA, HTN, HLD, CHF, CKD, left THA, blood loss anemia with small AVM, h/o  bladder cancer s/p chemotherapy, TURBT, Intravesicular BCG until 06/28/23, BPH, recurrent UTI who presented to the ED with fatigue, shortness of breath with exertion, flank/back pain, dysuria, foul-smelling urine,  confusion.  No reported fever, chills or nausea or vomiting.   At ED afebrile Labs remarkable for creatinine 1.47, WBC 8.2, UA cloudy large leukocytes, positive nitrite  Lab/imaging and microbiological data reviewed   Exam - elderly male lying in the bed, not in acute distress, hard of hearing, heart, lung and abdomen wnl, awake, alert and oriented, non focal. No rashes, No pedal edema, No CVA tenderness  Unclear if true UTI or chronic urinary symptoms with dehydration component   Plan Continue cefepime pending urine cx, plan to treat as probable simple cystitis.  Monitor CBC and BMP on abtx Dr Renold Don covering this weekend and will fu cultures   I have personally spent 80 minutes involved in face-to-face and non-face-to-face activities for this patient on the day of the visit. Professional time spent includes the following activities: Preparing to see the patient (review of tests), Obtaining and/or reviewing separately obtained history (admission/discharge record), Performing a medically appropriate examination and/or evaluation , Ordering medications/tests/procedures, referring and communicating with other health care professionals, Documenting clinical information in the EMR,  Independently interpreting results (not separately reported), Communicating results to the patient/family/caregiver, Counseling and educating the patient/family/caregiver and Care coordination (not separately reported).        [facility]    Date of Admission:  10/19/2023     Total days of antibiotics 2  1 dose of Zosyn  Cefepime daily         Reason for Consult: Possible cystitis with history of drug-resistant bacteria   Referring Provider: Adela [name] Primary Care Provider: Sondra Come, MD   Assessment: Tyler [name] is a 89 y.o. male admitted from home with:   Fatigue -  Shortness of breath - Back pain -  Dysuria -  Tyler [name] is here with his daughter Tyler [name] at the bedside his history and clinical course recently.  She does not feel like he has bounced back since the last time he was treated for a UTI.  Do not think all of his symptoms fit totally consistently with it specially the shortness of breath.  May be a factor of deconditioning versus anemia; he is spontaneously voiding though smaller amounts.  May have some component of BPH symptoms versus urinary retention versus dehydration being that he has a hard time keeping up with water intake.  Previously on methenamine to decrease cystitis frequency.  This was held 10 days ago his kidney function. Currently he does not have any fevers or leukocytosis, though his daughter voices that these are not common features of his UTIs.  I suspect he will always have abnormal urine studies and colonization colonization which we discussed makes it difficult to determine infection versus bacteriuria. He has a urine culture in process now.  -Continue cefepime -Follow-up urine culture results -Consider single dose gentamicin injection as an option for treatment given there is no signs of pyelonephritis  or nephrolithiasis on his CT scan -Continue to encourage adequate hydration -May be helpful to measure a postvoid  residual on bladder scan  History of quinolone resistant Pseudomonas recurrent UTIs -  In reviewing his previous culture studies is 4 where previously it was less than 2  Isolation recommendations -  No ESBL history and Pseudomonas is only resistant to quinolone class.  Standard precautions recommended   Plan: Continue cefepime Follow-up urine culture Would consider single dose gentamicin to spare of PICC line for possible cystitis Consider postvoid residual bladder scan   Principal Problem:   UTI (urinary tract infection) Active Problems:   Hyperlipidemia   Essential hypertension   CAD (coronary artery disease)   Malignant neoplasm of bladder, unspecified (HCC)   History of stroke   Chronic diastolic CHF (congestive heart failure) (HCC)   CKD (chronic kidney disease), stage III (HCC)    alfuzosin  10 mg Oral QHS   aspirin EC  81 mg Oral Daily   atorvastatin  40 mg Oral QHS   clopidogrel  75 mg Oral Daily   ferrous gluconate  324 mg Oral BID WC   pantoprazole  40 mg Oral Daily    HPI: Tyler [name] is a 89 y.o. male admitted to the hospital after he was noted to be feeling bad, urinary color changes and back pain.   The back pain was expereinced when walking - after some modifications to his walker this pain perissted. His activy tolerance and fatigue / dyspnea also increased. His daughter Tyler [name] does not feel like he bounced back very well from the last time he was treated for UTI. He follows with ID at [facility] for drug resistant pseudomonas bladder colonization. He was taking methenamine up until 3/18 when he was advised to hold it due to kidney function.   H/O High grade Ta diagnosed on 03/08/22 - intra-vesicular BCG 04/26/23 - 06/28/23 - normal interval for treatment was delayed d/t cystitis d/t pseudomonas.  PsA 12/24 - Cipro/Levaquin resistant, Cefepime MIC 4 (s) previous isolates < 2; proteus mirabilis sensitive   He also has CAD/CABG 2014, diastolic CHF, CKD3b, CVA,  AAA s/p EVAR 2015, RCA occlusions, GI bleed/AVM.     Review of Systems: Review of Systems  Constitutional:  Positive for malaise/fatigue. Negative for chills, diaphoresis and fever.  Respiratory: Negative.  Negative for cough.   Cardiovascular: Negative.   Gastrointestinal: Negative.   Genitourinary:  Positive for dysuria and frequency.  Skin:  Negative for rash.  Neurological:  Positive for dizziness.    Past Medical History:  Diagnosis Date   AAA (abdominal aortic aneurysm) (HCC)    a. 05/2014 s/p stent grafting [facility]).   Bronchitis    CAD (coronary artery disease)    a. 06/2013 VF Arrest/Cath: 3VD, EF 50%;  b. 06/2013 CABG x 4: LIMA->LAD, VG->PDA->RPL, VG->OM.   CVA (cerebral infarction)    Erectile dysfunction    Essential hypertension    GERD (gastroesophageal reflux disease)    H/O hiatal hernia    HOH (hard of hearing)    wears hearing aid in right ear   Hyperlipidemia    Osteoarthritis    Pneumonia    YRS AGO   Stroke [facility])    Past Surgical History:  Procedure Laterality Date   COLONOSCOPY WITH PROPOFOL N/A 02/26/2022   Procedure: COLONOSCOPY WITH PROPOFOL;  Surgeon: Jenel Lucks, MD;  Location: [facility];  Service: Gastroenterology;  Laterality: N/A;   CORONARY ARTERY BYPASS GRAFT N/A 06/27/2013   Procedure:  CORONARY ARTERY BYPASS GRAFTING (CABG);  Surgeon: Kerin Perna, MD;  Location: [facility] OR;  Service: Open Heart Surgery;  Laterality: N/A;  times four utilizing the left internal mammary artery and the right greater saphenous vein harvested endoscopically   ENDOVEIN HARVEST OF GREATER SAPHENOUS VEIN Right 06/27/2013   Procedure: ENDOVEIN HARVEST OF GREATER SAPHENOUS VEIN;  Surgeon: Kerin Perna, MD;  Location: [facility] OR;  Service: Open Heart Surgery;  Laterality: Right;   ESOPHAGOGASTRODUODENOSCOPY (EGD) WITH PROPOFOL N/A 02/25/2022   Procedure: ESOPHAGOGASTRODUODENOSCOPY (EGD) WITH PROPOFOL;  Surgeon: Jeani Hawking, MD;  Location: [facility];   Service: Gastroenterology;  Laterality: N/A;   EYE SURGERY     bilateral cataracts   GIVENS CAPSULE STUDY N/A 02/26/2022   Procedure: GIVENS CAPSULE STUDY;  Surgeon: Jenel Lucks, MD;  Location: [facility];  Service: Gastroenterology;  Laterality: N/A;   HEMOSTASIS CLIP PLACEMENT  02/26/2022   Procedure: HEMOSTASIS CLIP PLACEMENT;  Surgeon: Jenel Lucks, MD;  Location: [facility];  Service: Gastroenterology;;   Nelle Don EAR SURGERY Left 1982   INNER EAR SURGERY     severed a nerve in his left ear.    LEFT HEART CATHETERIZATION WITH CORONARY ANGIOGRAM N/A 06/27/2013   Procedure: LEFT HEART CATHETERIZATION WITH CORONARY ANGIOGRAM;  Surgeon: Lesleigh Noe, MD;  Location: [facility] CATH LAB;  Service: Cardiovascular;  Laterality: N/A;  iabp insertion, cpr, intubation, emergent cvts   POLYPECTOMY  02/26/2022   Procedure: POLYPECTOMY;  Surgeon: Jenel Lucks, MD;  Location: [facility];  Service: Gastroenterology;;   TEE WITHOUT CARDIOVERSION N/A 06/27/2013   Procedure: TRANSESOPHAGEAL ECHOCARDIOGRAM (TEE);  Surgeon: Kerin Perna, MD;  Location: [facility] OR;  Service: Open Heart Surgery;  Laterality: N/A;   TONSILLECTOMY     TOTAL HIP ARTHROPLASTY Left 09/01/2014   Procedure: TOTAL HIP ARTHROPLASTY;  Surgeon: Nestor Lewandowsky, MD;  Location: [facility] OR;  Service: Orthopedics;  Laterality: Left;    Social History   Tobacco Use   Smoking status: Never   Smokeless tobacco: Never  Vaping Use   Vaping status: Never Used  Substance Use Topics   Alcohol use: Never    Comment: Rare   Drug use: No    Family History  Problem Relation Age of Onset   Heart disease Sister    Heart disease Brother    Heart attack Sister    Heart attack Brother    Colon cancer Neg Hx    Hypertension Neg Hx    Stroke Neg Hx    Allergies  Allergen Reactions   Bactrim [Sulfamethoxazole-Trimethoprim] Itching and Rash    OBJECTIVE: Blood pressure (!) 132/56, pulse 67, temperature (!) 97.4 F (36.3 C),  temperature source Oral, resp. rate 16, height 5\' 7"  (1.702 m), weight 102 kg, SpO2 98%.  Physical Exam Vitals reviewed.  Constitutional:      Appearance: He is well-developed.  Cardiovascular:     Rate and Rhythm: Normal rate and regular rhythm.  Pulmonary:     Effort: Pulmonary effort is normal.     Breath sounds: Normal breath sounds.  Abdominal:     Tenderness: There is no abdominal tenderness.  Genitourinary:    Comments: Negative costovertebral angle tenderness Musculoskeletal:        General: Normal range of motion.  Skin:    General: Skin is warm and dry.  Neurological:     Mental Status: He is alert and oriented to person, place, and time.     Comments: Hard of hearing with hearing aids  in place     Lab Results Lab Results  Component Value Date   WBC 8.2 10/19/2023   HGB 11.1 (L) 10/19/2023   HCT 34.3 (L) 10/19/2023   MCV 94.0 10/19/2023   PLT 172 10/19/2023    Lab Results  Component Value Date   CREATININE 1.47 (H) 10/19/2023   BUN 17 10/19/2023   NA 140 10/19/2023   K 3.8 10/19/2023   CL 108 10/19/2023   CO2 22 10/19/2023    Lab Results  Component Value Date   ALT 10 10/19/2023   AST 17 10/19/2023   ALKPHOS 58 10/19/2023   BILITOT 0.7 10/19/2023     Microbiology: No results found for this or any previous visit (from the past 240 hours).  Imaging CT Renal Stone Study Result Date: 10/19/2023 CLINICAL DATA:  Flank pain EXAM: CT ABDOMEN AND PELVIS WITHOUT CONTRAST TECHNIQUE: Multidetector CT imaging of the abdomen and pelvis was performed following the standard protocol without IV contrast. RADIATION DOSE REDUCTION: This exam was performed according to the departmental dose-optimization program which includes automated exposure control, adjustment of the mA and/or kV according to patient size and/or use of iterative reconstruction technique. COMPARISON:  02/03/2022 FINDINGS: Lower chest: Mild atelectatic changes are noted in the left base. Minimal left  pleural effusion is seen. Hepatobiliary: No focal liver abnormality is seen. No gallstones, gallbladder wall thickening, or biliary dilatation. Pancreas: Unremarkable. No pancreatic ductal dilatation or surrounding inflammatory changes. Spleen: Normal in size without focal abnormality. Adrenals/Urinary Tract: Adrenal glands are within normal limits. Kidneys are well visualized bilaterally. No renal calculi or obstructive changes are seen. The ureters are within normal limits. Bladder is partially distended. Stomach/Bowel: No obstructive or inflammatory changes of the colon are noted. The appendix is within normal limits. Small bowel and stomach are unremarkable. Vascular/Lymphatic: Atherosclerotic calcifications of the aorta are noted. Aortic stent graft is seen in satisfactory position. The aneurysm sac is stable at 6.2 cm. The lack of contrast precludes evaluation for endoleak although no significant enlargement of the sac is identified to suggest endoleak. No lymphadenopathy is noted. Reproductive: Prostate is unremarkable. Other: No abdominal wall hernia or abnormality. No abdominopelvic ascites. Musculoskeletal: Left hip replacement is noted. Degenerative changes of lumbar spine are seen. IMPRESSION: Stable aortic stent graft with residual aneurysm sac. No focal abnormality to correspond with the given clinical history is noted. Mild left basilar atelectasis. Electronically Signed   By: Alcide Clever M.D.   On: 10/19/2023 21:43   DG Chest 1 View Result Date: 10/19/2023 CLINICAL DATA:  Acute shortness of breath. EXAM: CHEST  1 VIEW COMPARISON:  August 17, 2022. FINDINGS: The heart size and mediastinal contours are within normal limits. Status post coronary artery bypass graft. Both lungs are clear. The visualized skeletal structures are unremarkable. IMPRESSION: No active disease. Electronically Signed   By: Lupita Raider M.D.   On: 10/19/2023 16:02    Rexene Alberts, MSN, NP-C [facility]  [email or web address] Pager: [phone] [facility] Main Line: [phone] *Secure Chat Communication Welcome   10/20/2023 10:03 AM

## 2023-10-20 NOTE — Assessment & Plan Note (Signed)
 Continue Plavix and Lipitor

## 2023-10-20 NOTE — Evaluation (Signed)
 Physical Therapy Evaluation  Patient Details Name: Tyler [name] MRN: [id number] DOB: 1928-07-31 Today's Date: 10/20/2023  History of Present Illness  Pt is a 89 y/o male who presents 10/19/2023 with AMS, back pain, and straining to urinate. Pt admitted with complicated UTI. PMH significant for AAA, CAD, CVA, HTN, OA, CABG, L THA.  Clinical Impression  Pt admitted with above diagnosis. Pt currently with functional limitations due to the deficits listed below (see PT Problem List). At the time of PT eval pt was able to perform transfers and ambulation with up to mod assist and RW for support. Pt has wheelchair in room and was sitting up in w/c upon PT arrival. Anticipate pt will be safe for d/c home with family support and HHPT to follow up. Pt and wife in agreement. Pt will benefit from acute skilled PT to increase their independence and safety with mobility to allow discharge.           If plan is discharge home, recommend the following: A little help with walking and/or transfers;A little help with bathing/dressing/bathroom;Assistance with cooking/housework;Assist for transportation;Help with stairs or ramp for entrance   Can travel by private vehicle        Equipment Recommendations None recommended by PT  Recommendations for Other Services       Functional Status Assessment Patient has had a recent decline in their functional status and demonstrates the ability to make significant improvements in function in a reasonable and predictable amount of time.     Precautions / Restrictions Precautions Precautions: Fall Recall of Precautions/Restrictions: Intact Precaution/Restrictions Comments: Pt has non-skid socks from home however the grips are worn down and slippery. Restrictions Weight Bearing Restrictions Per Provider Order: No      Mobility  Bed Mobility               General bed mobility comments: Pt was received sitting up in the wheelchair with wife present.     Transfers Overall transfer level: Needs assistance Equipment used: Rolling walker (2 wheels) Transfers: Sit to/from Stand Sit to Stand: Mod assist           General transfer comment: VC's for hand placement on seated surface for safety. Mod assist required for power up to full stand.    Ambulation/Gait Ambulation/Gait assistance: Contact guard assist Gait Distance (Feet): 25 Feet Assistive device: Rolling walker (2 wheels) Gait Pattern/deviations: Step-through pattern, Decreased stride length, Trunk flexed Gait velocity: Decreased Gait velocity interpretation: <1.31 ft/sec, indicative of household ambulator   General Gait Details: Low floor clearance while advancing feet and maintained a flexed trunk throughout. Overall no overt LOB noted with ambulation out to the hallway and then return to wheelchair.  Stairs            Wheelchair Mobility     Tilt Bed    Modified Rankin (Stroke Patients Only)       Balance Overall balance assessment: Needs assistance Sitting-balance support: Feet supported, No upper extremity supported Sitting balance-Leahy Scale: Fair     Standing balance support: During functional activity, Reliant on assistive device for balance, Single extremity supported Standing balance-Leahy Scale: Poor Standing balance comment: Pt stood to walker to void with urinal. Pt able to hold walker with 1 hand and the urinal with the other hand. Assist required to hold gown up and manage incontinence briefs.  Pertinent Vitals/Pain Pain Assessment Pain Assessment: No/denies pain    Home Living Family/patient expects to be discharged to:: Private residence Living Arrangements: Spouse/significant other Available Help at Discharge: Family;Available 24 hours/day Type of Home:  (Townhome) Home Access: Level entry     Alternate Level Stairs-Number of Steps: flight Home Layout: Two level;Able to live on main level  with bedroom/bathroom Home Equipment: Toilet riser;Rolling Walker (2 wheels);BSC/3in1;Grab bars - tub/shower;Wheelchair - manual;Shower seat      Prior Function Prior Level of Function : Independent/Modified Independent             Mobility Comments: Amb short household distances with RW, primarily bed to chair and the bathroom. Pt is in the wheelchair for most of the day. ADLs Comments: Pt enters his walk-in shower and bathes without assistance utilizing shower chair. Pt can dress himself, except wife assists to don socks. Pt can don slip-on shoes without assist.     Extremity/Trunk Assessment   Upper Extremity Assessment Upper Extremity Assessment: Generalized weakness (Difficulty opening items on the lunch tray - wife assisting)    Lower Extremity Assessment Lower Extremity Assessment: Generalized weakness    Cervical / Trunk Assessment Cervical / Trunk Assessment: Other exceptions Cervical / Trunk Exceptions: Forward head posture with rounded shoulders  Communication   Communication Communication: Impaired Factors Affecting Communication: Hearing impaired (hearing aides)    Cognition Arousal: Alert Behavior During Therapy: Flat affect   PT - Cognitive impairments: No apparent impairments                         Following commands: Intact       Cueing Cueing Techniques: Verbal cues, Gestural cues     General Comments      Exercises     Assessment/Plan    PT Assessment Patient needs continued PT services  PT Problem List Decreased strength;Decreased activity tolerance;Decreased balance;Decreased mobility;Decreased knowledge of use of DME;Decreased safety awareness;Decreased knowledge of precautions;Pain       PT Treatment Interventions DME instruction;Gait training;Functional mobility training;Therapeutic activities;Therapeutic exercise;Balance training;Patient/family education    PT Goals (Current goals can be found in the Care Plan section)   Acute Rehab PT Goals Patient Stated Goal: Return home with family PT Goal Formulation: With patient/family Time For Goal Achievement: 10/27/23 Potential to Achieve Goals: Good    Frequency Min 3X/week     Co-evaluation               AM-PAC PT "6 Clicks" Mobility  Outcome Measure Help needed turning from your back to your side while in a flat bed without using bedrails?: A Little Help needed moving from lying on your back to sitting on the side of a flat bed without using bedrails?: A Lot Help needed moving to and from a bed to a chair (including a wheelchair)?: A Little Help needed standing up from a chair using your arms (e.g., wheelchair or bedside chair)?: A Lot Help needed to walk in hospital room?: A Little Help needed climbing 3-5 steps with a railing? : A Lot 6 Click Score: 15    End of Session Equipment Utilized During Treatment: Gait belt Activity Tolerance: Patient tolerated treatment well Patient left: in chair;with call bell/phone within reach;with family/visitor present (In wheelchair) Nurse Communication: Mobility status PT Visit Diagnosis: Unsteadiness on feet (R26.81);Difficulty in walking, not elsewhere classified (R26.2)    Time: 8295-6213 PT Time Calculation (min) (ACUTE ONLY): 27 min   Charges:   PT Evaluation $PT Eval  Moderate Complexity: 1 Mod PT Treatments $Gait Training: 8-22 mins PT General Charges $$ ACUTE PT VISIT: 1 Visit         Tyler [name], PT, DPT Acute Rehabilitation Services Secure Chat Preferred Office: [phone]   Marylynn Pearson 10/20/2023, 2:56 PM

## 2023-10-20 NOTE — Assessment & Plan Note (Signed)
Continue Lipitor 40 mg a day

## 2023-10-20 NOTE — Assessment & Plan Note (Addendum)
 Patient has not been using metoprolol we will continue to hold for now unless blood pressure elevates

## 2023-10-20 NOTE — ED Notes (Signed)
 6 [address] called and verified that patient was ready for transportation to floor.  Daughter remains at bedside.  Pt alert and oriented.

## 2023-10-20 NOTE — Assessment & Plan Note (Signed)
-   currently appears to be slightly on the dry side, hold home diuretics for tonight and restart when appears euvolemic, carefuly follow fluid status and Cr  

## 2023-10-21 DIAGNOSIS — Z7189 Other specified counseling: Secondary | ICD-10-CM

## 2023-10-21 DIAGNOSIS — I10 Essential (primary) hypertension: Secondary | ICD-10-CM | POA: Diagnosis not present

## 2023-10-21 DIAGNOSIS — I2510 Atherosclerotic heart disease of native coronary artery without angina pectoris: Secondary | ICD-10-CM | POA: Diagnosis not present

## 2023-10-21 DIAGNOSIS — Z8673 Personal history of transient ischemic attack (TIA), and cerebral infarction without residual deficits: Secondary | ICD-10-CM | POA: Diagnosis not present

## 2023-10-21 DIAGNOSIS — N3001 Acute cystitis with hematuria: Secondary | ICD-10-CM | POA: Diagnosis not present

## 2023-10-21 LAB — RENAL FUNCTION PANEL
Albumin: 3.1 g/dL — ABNORMAL LOW (ref 3.5–5.0)
Anion gap: 9 (ref 5–15)
BUN: 17 mg/dL (ref 8–23)
CO2: 22 mmol/L (ref 22–32)
Calcium: 8.7 mg/dL — ABNORMAL LOW (ref 8.9–10.3)
Chloride: 109 mmol/L (ref 98–111)
Creatinine, Ser: 1.46 mg/dL — ABNORMAL HIGH (ref 0.61–1.24)
GFR, Estimated: 45 mL/min — ABNORMAL LOW (ref >60)
Glucose, Bld: 135 mg/dL — ABNORMAL HIGH (ref 70–99)
Phosphorus: 3.1 mg/dL (ref 2.5–4.6)
Potassium: 3.7 mmol/L (ref 3.5–5.1)
Sodium: 140 mmol/L (ref 135–145)

## 2023-10-21 LAB — CBC
HCT: 33.5 % — ABNORMAL LOW (ref 39.0–52.0)
Hemoglobin: 10.7 g/dL — ABNORMAL LOW (ref 13.0–17.0)
MCH: 29.9 pg (ref 26.0–34.0)
MCHC: 31.9 g/dL (ref 30.0–36.0)
MCV: 93.6 fL (ref 80.0–100.0)
Platelets: 153 10*3/uL (ref 150–400)
RBC: 3.58 MIL/uL — ABNORMAL LOW (ref 4.22–5.81)
RDW: 15 % (ref 11.5–15.5)
WBC: 8.1 10*3/uL (ref 4.0–10.5)
nRBC: 0 % (ref 0.0–0.2)

## 2023-10-21 LAB — MAGNESIUM: Magnesium: 2 mg/dL (ref 1.7–2.4)

## 2023-10-21 NOTE — Plan of Care (Signed)

## 2023-10-21 NOTE — Progress Notes (Signed)
 PROGRESS NOTE  Tyler [name] VHQ:[id number] DOB: 03-Mar-1929   PCP: Sondra Come, MD  Patient is from: Home.  Lives with Wife.  Uses rolling walker  DOA: 10/19/2023 LOS: 1  Chief complaints Chief Complaint  Patient presents with   Fatigue   Shortness of Breath   Back Pain   UTI   Dysuria     Brief Narrative / Interim history: 89 year old M with PMH of bladder cancer s/p resection, recurrent UTI, CAD/CABG in 2014, diastolic CHF, CKD-3B, CVA, AAA s/p stent grafting in 2015, right carotid artery occlusion and severe left ICA stenosis followed by vascular, anemia and GI bleed/AVM presented to ED with shortness of breath, fatigue, malaise, dysuria and flank pain, and admitted with working diagnosis of complicated UTI.   In ED, normal vitals. Cr 1.47.  BUN 17.  WBC 8.2.  Hgb 11.1.  UA with large LE, positive nitrites, few bacteria and moderate Hgb without significant RBC.  CT renal stone study without acute finding.  Urine culture obtained.  Started on IV cefepime based on previous urine culture results.    Remains on IV cefepime.  Urine culture pending.  ID on board.  Subjective: Seen and examined this afternoon.  No major events overnight of this morning.  No further dysuria, frequency or urgency.  Back to baseline.  Urine culture pending.  Daughter at bedside.  Objective: Vitals:   10/20/23 2046 10/21/23 0453 10/21/23 0736 10/21/23 1548  BP: (!) 147/70 (!) 158/75 115/82 112/60  Pulse: 72 76 77 79  Resp: 16 18 16 16   Temp: 98 F (36.7 C) 98 F (36.7 C) (!) 97.5 F (36.4 C) 97.8 F (36.6 C)  TempSrc: Oral  Oral Oral  SpO2: 98% 96% 97% 98%  Weight:      Height:        Examination:  GENERAL: No apparent distress.  Sitting on his wheelchair watching soccer game HEENT: MMM.  Vision and hearing grossly intact.  NECK: Supple.  No apparent JVD.  RESP:  No IWOB.  Fair aeration bilaterally. CVS:  RRR. Heart sounds normal.  ABD/GI/GU: BS+. Abd soft, NTND.  MSK/EXT:  Moves  extremities. No apparent deformity. No edema.  SKIN: no apparent skin lesion or wound NEURO: Awake, alert and oriented fairly.  Good sense of humor.  No apparent focal neuro deficit. PSYCH: Calm. Normal affect.   Consultants:  Infectious disease  Procedures: None  Microbiology summarized: Urine culture pending  Assessment and plan: Complicated UTI in a patient with history of recurrent UTI and bladder cancer s/p resection: Presents with malaise, dysuria, flank pain.  No fever or leukocytosis.  UA consistent with UTI.  CT renal stone study without acute finding. History of Pseudomonas, Enterococcus faecalis and Proteus Mirabilis -Continue IV cefepime based on previous urine cultures -Follow urine culture -ID on board.  Malignant neoplasm of bladder s/p resection.  CT renal stone study without acute finding. -Outpatient follow-up with urology -Continue home Uroxatral   History of CAD/CABG in 2014: Stable.  No anginal symptoms.  Does not take Toprol-XL. -Continue home statin, Plavix and aspirin  Chronic diastolic CHF: In 4132 with LVEF of 60 to 65%, G1 DD.  Takes Lasix as needed at home.  Appears euvolemic on exam. -Monitor fluid status and respiratory status  CKD-3A: Stable. Recent Labs    12/18/22 1624 10/19/23 1507 10/20/23 1044 10/21/23 0953  BUN 25* 17 17 17   CREATININE 1.91* 1.47* 1.36* 1.46*  -Monitor intermittently  Iron deficiency anemia: H&H relatively stable. Anemia panel with  some iron deficiency and B12 deficiency.  On p.o. iron at home.  Had dark stool attributed to iron.  Patient's daughter denies hematuria or hematochezia.  He is on Protonix Recent Labs    12/18/22 1624 10/19/23 1507 10/20/23 1044 10/21/23 0953  HGB 12.5* 11.1* 10.1* 10.7*  -Received IV Venofer 300 mg on 3/28 -Vitamin B12 injection 1000 mcg daily on 3/28 and 29. -Continue p.o. vitamin B12 -Continue monitoring  Essential hypertension: Normotensive for most part.  Does not take  medication on daily basis.  History of GI bleed/history of AVM: on DAPT likely due to severe carotid artery stenosis.  -Continue Protonix  History of stroke/right carotid occlusion/severe left carotid artery stenosis -Deferred TCAR in 2022 -Continue Plavix, aspirin and statin  AAA s/p graft: CT renal stone study showed stable aortic stent graft with residual aneurysm sac. -Statin, Plavix and aspirin as above   Hyperlipidemia -Continue Lipitor 40 mg a day   Class II obesity Body mass index is 35.22 kg/m.          DVT prophylaxis:  SCDs Start: 10/20/23 0253  Code Status: Full code Family Communication: Updated patient's daughter at bedside Level of care: Med-Surg Status is: Inpatient The patient will remain inpatient because: Complicated UTI   Final disposition: Home   55 minutes with more than 50% spent in reviewing records, counseling patient/family and coordinating care.   Sch Meds:  Scheduled Meds:  alfuzosin  10 mg Oral QHS   aspirin EC  81 mg Oral Daily   atorvastatin  40 mg Oral QHS   clopidogrel  75 mg Oral Daily   [START ON 10/22/2023] vitamin B-12  1,000 mcg Oral Daily   ferrous gluconate  324 mg Oral BID WC   pantoprazole  40 mg Oral Daily   Continuous Infusions:  ceFEPime (MAXIPIME) IV 1 g (10/21/23 0150)   PRN Meds:.acetaminophen **OR** acetaminophen, HYDROcodone-acetaminophen, ondansetron **OR** ondansetron (ZOFRAN) IV  Antimicrobials: Anti-infectives (From admission, onward)    Start     Dose/Rate Route Frequency Ordered Stop   10/20/23 0200  ceFEPIme (MAXIPIME) 1 g in sodium chloride 0.9 % 100 mL IVPB        1 g 200 mL/hr over 30 Minutes Intravenous Every 24 hours 10/20/23 0145     10/20/23 0000  cefpodoxime (VANTIN) 200 MG tablet        200 mg Oral 2 times daily 10/19/23 2151 10/30/23 2359   10/19/23 1815  piperacillin-tazobactam (ZOSYN) IVPB 3.375 g        3.375 g 100 mL/hr over 30 Minutes Intravenous  Once 10/19/23 1808 10/19/23 2124    10/19/23 1800  cefTRIAXone (ROCEPHIN) 1 g in sodium chloride 0.9 % 100 mL IVPB  Status:  Discontinued        1 g 200 mL/hr over 30 Minutes Intravenous  Once 10/19/23 1757 10/19/23 1758        I have personally reviewed the following labs and images: CBC: Recent Labs  Lab 10/19/23 1507 10/20/23 1044 10/21/23 0953  WBC 8.2 8.1 8.1  HGB 11.1* 10.1* 10.7*  HCT 34.3* 31.8* 33.5*  MCV 94.0 94.9 93.6  PLT 172 148* 153   BMP &GFR Recent Labs  Lab 10/19/23 1507 10/19/23 2028 10/20/23 1044 10/21/23 0953  NA 140  --  139 140  K 3.8  --  3.7 3.7  CL 108  --  110 109  CO2 22  --  22 22  GLUCOSE 112*  --  91 135*  BUN 17  --  17 17  CREATININE 1.47*  --  1.36* 1.46*  CALCIUM 8.5*  --  8.1* 8.7*  MG  --  2.1 2.0 2.0  PHOS  --  3.5 3.3 3.1   Estimated Creatinine Clearance: 36.8 mL/min (A) (by C-G formula based on SCr of 1.46 mg/dL (H)). Liver & Pancreas: Recent Labs  Lab 10/19/23 1507 10/20/23 1044 10/21/23 0953  AST 17 15  --   ALT 10 12  --   ALKPHOS 58 54  --   BILITOT 0.7 0.5  --   PROT 6.4* 5.8*  --   ALBUMIN 3.3* 3.0* 3.1*   No results for input(s): "LIPASE", "AMYLASE" in the last 168 hours. No results for input(s): "AMMONIA" in the last 168 hours. Diabetic: No results for input(s): "HGBA1C" in the last 72 hours. No results for input(s): "GLUCAP" in the last 168 hours. Cardiac Enzymes: Recent Labs  Lab 10/19/23 2028  CKTOTAL 38*   No results for input(s): "PROBNP" in the last 8760 hours. Coagulation Profile: No results for input(s): "INR", "PROTIME" in the last 168 hours. Thyroid Function Tests: No results for input(s): "TSH", "T4TOTAL", "FREET4", "T3FREE", "THYROIDAB" in the last 72 hours. Lipid Profile: No results for input(s): "CHOL", "HDL", "LDLCALC", "TRIG", "CHOLHDL", "LDLDIRECT" in the last 72 hours. Anemia Panel: Recent Labs    10/19/23 1514 10/19/23 2028 10/20/23 1044  VITAMINB12  --   --  202  FOLATE  --   --  9.7  FERRITIN  --  44  --    TIBC  --  311  --   IRON  --  26*  --   RETICCTPCT 2.6  --   --    Urine analysis:    Component Value Date/Time   COLORURINE YELLOW 10/19/2023 1508   APPEARANCEUR CLOUDY (A) 10/19/2023 1508   LABSPEC 1.014 10/19/2023 1508   PHURINE 5.0 10/19/2023 1508   GLUCOSEU NEGATIVE 10/19/2023 1508   HGBUR MODERATE (A) 10/19/2023 1508   BILIRUBINUR NEGATIVE 10/19/2023 1508   KETONESUR NEGATIVE 10/19/2023 1508   PROTEINUR NEGATIVE 10/19/2023 1508   UROBILINOGEN 0.2 08/22/2014 1559   NITRITE POSITIVE (A) 10/19/2023 1508   LEUKOCYTESUR LARGE (A) 10/19/2023 1508   Sepsis Labs: Invalid input(s): "PROCALCITONIN", "LACTICIDVEN"  Microbiology: Recent Results (from the past 240 hours)  Urine Culture     Status: None (Preliminary result)   Collection Time: 10/19/23  3:08 PM   Specimen: Urine, Clean Catch  Result Value Ref Range Status   Specimen Description URINE, CLEAN CATCH  Final   Special Requests NONE  Final   Culture   Final    CULTURE REINCUBATED FOR BETTER GROWTH Performed at [facility], [address], [place], [place] [address]    Report Status PENDING  Incomplete    Radiology Studies: No results found.     Kayl Stogdill T. Dorie Ohms [facility]  If 7PM-7AM, please contact night-coverage [email or web address] 10/21/2023, 4:16 PM

## 2023-10-21 NOTE — IPAL (Signed)
  Interdisciplinary Goals of Care Family Meeting   Date carried out: 10/21/2023  Location of the meeting: Bedside  Member's involved: Physician and Family Member or next of kin (daughter)  Durable Power of Pensions consultant or Environmental health practitioner: Patient  Discussion: We discussed goals of care for [employer] .  Discussed CODE STATUS including pros and cons of CPR in light of his advanced age and underlying comorbidity including but not limited to bladder cancer s/p resection, recurrent UTI, CAD/CABG in 2014, diastolic CHF, CKD-3B, CVA, AAA s/p stent grafting in 2015, right carotid artery occlusion and severe left ICA stenosis followed by vascular, anemia, GI bleed/AVM and physical deconditioning.  Patient is currently stable.  However, I believe risk of cardiopulmonary resuscitation outweighs the benefits if we happen to be in the situation.  I frankly recommended DNR/DNI.  Patient and daughter voiced understanding but they would like to discuss with PCP at next follow-up.   Code status:   Code Status: Full Code   Disposition: Continue current acute care  Time spent for the meeting: 30    Almon Hercules, MD  10/21/2023, 4:21 PM

## 2023-10-21 NOTE — Progress Notes (Signed)
 Physical Therapy Treatment Patient Details Name: Tyler [name] MRN: [id number] DOB: 1928-11-03 Today's Date: 10/21/2023   History of Present Illness Pt is a 89 y/o male who presents 10/19/2023 with AMS, back pain, and straining to urinate. Pt admitted with complicated UTI. PMH significant for AAA, CAD, CVA, HTN, OA, CABG, L THA.    PT Comments  Pt admitted with above diagnosis. Pt was able to ambulate with rW with CGA.  Pt with flexed posture however was safe and wife states that pt is ambulating like he did at baseline.  Pt incontinent x 2 (wears Depends at home).  Nurse asked PT if daughter can walk pt and this PT agrees that daughter can walk pt as wife states pt is at baseline.  Will continue to follow acutely.  Pt currently with functional limitations due to the deficits listed below (see PT Problem List). Pt will benefit from acute skilled PT to increase their independence and safety with mobility to allow discharge.       If plan is discharge home, recommend the following: A little help with walking and/or transfers;A little help with bathing/dressing/bathroom;Assistance with cooking/housework;Assist for transportation;Help with stairs or ramp for entrance   Can travel by private vehicle        Equipment Recommendations  None recommended by PT    Recommendations for Other Services       Precautions / Restrictions Precautions Precautions: Fall Recall of Precautions/Restrictions: Intact Restrictions Weight Bearing Restrictions Per Provider Order: No     Mobility  Bed Mobility Overal bed mobility: Needs Assistance Bed Mobility: Supine to Sit     Supine to sit: Mod assist, Used rails     General bed mobility comments: Pt ultimately needed mod assist for trunk and min assist for LES.  Wife present and she states he has rails on both sides at home so he can get out with a little help and incr time.    Transfers Overall transfer level: Needs assistance Equipment used:  Rolling walker (2 wheels) Transfers: Bed to chair/wheelchair/BSC Sit to Stand: Min assist, From elevated surface           General transfer comment: Tyler [name] was wet once pt sat up therefore changed pts gown prior to getting up.  Wife states he wears Depends at home. VC's for hand placement on seated surface for safety. Wife states he pulls up on RW at home. Min assist required for power up to full stand from elevated surface. Practiced x 3 and pt got better the more he practiced. Pt does best if cued to separate his feet and cues for foot placement as well.    Ambulation/Gait Ambulation/Gait assistance: Contact guard assist Gait Distance (Feet): 60 Feet (30 feet then 60 feet then 20 feet) Assistive device: Rolling walker (2 wheels) Gait Pattern/deviations: Step-through pattern, Decreased stride length, Trunk flexed, Narrow base of support, Drifts right/left, Shuffle Gait velocity: Decreased Gait velocity interpretation: <1.31 ft/sec, indicative of household ambulator   General Gait Details: Low floor clearance while advancing feet and maintained a flexed trunk throughout. Overall no overt LOB noted with ambulation.  Once pt was a little out in hallway, he stated he needed to urinate. Pt urinated prior to PT being able to get urinal in place.  Pt walked to chair and PT cleaned pt, LEs and changed socks.   Stairs             Wheelchair Mobility     Tilt Bed    Modified Rankin (Stroke  Patients Only)       Balance Overall balance assessment: Needs assistance Sitting-balance support: Feet supported, No upper extremity supported Sitting balance-Leahy Scale: Good     Standing balance support: Reliant on assistive device for balance, Bilateral upper extremity supported, During functional activity Standing balance-Leahy Scale: Poor Standing balance comment: Pt stood to walker to void with urinal. Assist required to hold gown up and to hold urinal in place. Pt relies on UE support  for balance.                            Communication Communication Communication: No apparent difficulties Factors Affecting Communication: Hearing impaired  Cognition Arousal: Alert Behavior During Therapy: WFL for tasks assessed/performed   PT - Cognitive impairments: No apparent impairments                         Following commands: Intact      Cueing Cueing Techniques: Verbal cues, Gestural cues  Exercises General Exercises - Lower Extremity Ankle Circles/Pumps: AROM, Both, 10 reps, Seated Long Arc Quad: AROM, Both, 10 reps, Seated Hip Flexion/Marching: AROM, Both, 10 reps, Seated    General Comments        Pertinent Vitals/Pain Pain Assessment Pain Assessment: No/denies pain    Home Living                          Prior Function            PT Goals (current goals can now be found in the care plan section) Acute Rehab PT Goals Patient Stated Goal: Return home with family Progress towards PT goals: Progressing toward goals    Frequency    Min 3X/week      PT Plan      Co-evaluation              AM-PAC PT "6 Clicks" Mobility   Outcome Measure  Help needed turning from your back to your side while in a flat bed without using bedrails?: A Little Help needed moving from lying on your back to sitting on the side of a flat bed without using bedrails?: A Lot Help needed moving to and from a bed to a chair (including a wheelchair)?: A Little Help needed standing up from a chair using your arms (e.g., wheelchair or bedside chair)?: A Little Help needed to walk in hospital room?: A Little Help needed climbing 3-5 steps with a railing? : A Lot 6 Click Score: 16    End of Session Equipment Utilized During Treatment: Gait belt Activity Tolerance: Patient tolerated treatment well Patient left: in chair;with call bell/phone within reach;with family/visitor present;with chair alarm set Nurse Communication: Mobility  status PT Visit Diagnosis: Unsteadiness on feet (R26.81);Difficulty in walking, not elsewhere classified (R26.2)     Time: 1610-9604 PT Time Calculation (min) (ACUTE ONLY): 34 min  Charges:    $Gait Training: 8-22 mins $Self Care/Home Management: 8-22 PT General Charges $$ ACUTE PT VISIT: 1 Visit                     Sarely Stracener M,PT Acute Rehab Services [phone]    Tyler [name] 10/21/2023, 3:13 PM

## 2023-10-21 NOTE — Plan of Care (Signed)
  Problem: Pain Managment: Goal: General experience of comfort will improve and/or be controlled Outcome: Progressing   Problem: Safety: Goal: Ability to remain free from injury will improve Outcome: Progressing

## 2023-10-22 DIAGNOSIS — N1831 Chronic kidney disease, stage 3a: Secondary | ICD-10-CM | POA: Diagnosis not present

## 2023-10-22 DIAGNOSIS — I10 Essential (primary) hypertension: Secondary | ICD-10-CM | POA: Diagnosis not present

## 2023-10-22 DIAGNOSIS — N3001 Acute cystitis with hematuria: Secondary | ICD-10-CM | POA: Diagnosis not present

## 2023-10-22 NOTE — Plan of Care (Signed)

## 2023-10-22 NOTE — Hospital Course (Signed)
 Tyler [name] is a 89 y.o. male with a history of bladder cancer s/p resection, recurrent UTI, CAD status post CABG, diastolic heart failure CKD stage IIIb, CVA, AAA status post graft, right carotid artery occlusion and severe left ICA stenosis, anemia, GI bleeding with AVMs.  Patient presented secondary to dyspnea, fatigue, myalgias, dysuria and flank pain with evidence of complicated UTI on admission.  Patient started on Zosyn IV initially with transition to cefepime IV.  ID consulted secondary to complicated history.  Urine cultures pending.

## 2023-10-22 NOTE — Progress Notes (Signed)
 PROGRESS NOTE    Tyler [name]  ZOX:[id number] DOB: 06-Aug-1928 DOA: 10/19/2023 PCP: Sondra Come, MD   Brief Narrative: Tyler [name] is a 89 y.o. male with a history of bladder cancer s/p resection, recurrent UTI, CAD status post CABG, diastolic heart failure CKD stage IIIb, CVA, AAA status post graft, right carotid artery occlusion and severe left ICA stenosis, anemia, GI bleeding with AVMs.  Patient presented secondary to dyspnea, fatigue, myalgias, dysuria and flank pain with evidence of complicated UTI on admission.  Patient started on Zosyn IV initially with transition to cefepime IV.  ID consulted secondary to complicated history.  Urine cultures pending.   Assessment and Plan:  Complicated UTI Recurrent issue of UTI.  Complicated by history of bladder cancer s/p resection.  Urine culture obtained on admission.  Infectious disease consulted.  Patient started empirically on Zosyn IV and transition to cefepime IV.  Awaiting culture results. -Follow-up ID recommendations -Continue cefepime pending urine culture data  History of malignant neoplasm of bladder Noted.  Patient follows with [facility].  Patient status post cystoscopy with transurethral resection of bladder tumor in August 2024.  Patient status post chemo.  CAD History of CABG. No chest pain. Stable. -Continue Lipitor, Plavix and aspirin  Chronic diastolic heart failure Stable.  CKD stage IIIa Stable.  Iron deficiency anemia Stable hemoglobin. Patient is on iron supplementation as an outpatient. No evidence of ongoing hemorrhaging. IV iron given this admission.  Low-normal vitamin B12  Patient started on vitamin B12 supplementation.  -Continue vitamin B12  Primary hypertension Well-controlled at this time.  History of GI bleeding Secondary to AVMs in setting of DAPT. No bleeding noted this admission. -Continue Protonix  AAA Patient has history of graft.  CT renal stone study was  significant for stable aortic stent graft with residual aneurysm sac. -Continue statin, Plavix, aspirin  History of CVA History of right ICA occlusion Severe left ICA stenosis Noted.  Patient follows vascular surgeon outpatient. -Continue Plavix, aspirin, Lipitor  Hyperlipidemia -Continue Lipitor  Obesity, class II Estimated body mass index is 35.22 kg/m as calculated from the following:   Height as of this encounter: 5\' 7"  (1.702 m).   Weight as of this encounter: 102 kg.   DVT prophylaxis: SCDs Code Status:   Code Status: Full Code Family Communication: Daughter at bedside Disposition Plan: Discharge home likely in 24 hours pending culture data and transition to outpatient antibiotic regimen   Consultants:  Infectious disease  Procedures:  None  Antimicrobials: Zosyn IV Cefepime IV    Subjective: Patient with some mild dysuria but this has improved since admission. No specific concerns.  Objective: BP 125/71 (BP Location: Right Arm)   Pulse 75   Temp 98.1 F (36.7 C) (Oral)   Resp 18   Ht 5\' 7"  (1.702 m)   Wt 102 kg Comment: 06/2023  SpO2 98%   BMI 35.22 kg/m   Examination:  General exam: Appears calm and comfortable Respiratory system: Clear to auscultation. Respiratory effort normal. Cardiovascular system: S1 & S2 heard, RRR. No murmurs, rubs, gallops or clicks. Gastrointestinal system: Abdomen is nondistended, soft and nontender. Normal bowel sounds heard. Central nervous system: Alert and oriented. No focal neurological deficits. Musculoskeletal: No edema. No calf tenderness Psychiatry: Judgement and insight appear normal. Mood & affect appropriate.    Data Reviewed: I have personally reviewed following labs and imaging studies  CBC Lab Results  Component Value Date   WBC 8.1 10/21/2023   RBC 3.58 (L) 10/21/2023  HGB 10.7 (L) 10/21/2023   HCT 33.5 (L) 10/21/2023   MCV 93.6 10/21/2023   MCH 29.9 10/21/2023   PLT 153 10/21/2023   MCHC  31.9 10/21/2023   RDW 15.0 10/21/2023   LYMPHSABS 1.8 12/18/2022   MONOABS 0.7 12/18/2022   EOSABS 0.1 12/18/2022   BASOSABS 0.1 12/18/2022     Last metabolic panel Lab Results  Component Value Date   NA 140 10/21/2023   K 3.7 10/21/2023   CL 109 10/21/2023   CO2 22 10/21/2023   BUN 17 10/21/2023   CREATININE 1.46 (H) 10/21/2023   GLUCOSE 135 (H) 10/21/2023   GFRNONAA 45 (L) 10/21/2023   GFRAA 46 (L) 06/03/2020   CALCIUM 8.7 (L) 10/21/2023   PHOS 3.1 10/21/2023   PROT 5.8 (L) 10/20/2023   ALBUMIN 3.1 (L) 10/21/2023   BILITOT 0.5 10/20/2023   ALKPHOS 54 10/20/2023   AST 15 10/20/2023   ALT 12 10/20/2023   ANIONGAP 9 10/21/2023    GFR: Estimated Creatinine Clearance: 36.8 mL/min (A) (by C-G formula based on SCr of 1.46 mg/dL (H)).  Recent Results (from the past 240 hours)  Urine Culture     Status: None (Preliminary result)   Collection Time: 10/19/23  3:08 PM   Specimen: Urine, Clean Catch  Result Value Ref Range Status   Specimen Description URINE, CLEAN CATCH  Final   Special Requests NONE  Final   Culture   Final    CULTURE REINCUBATED FOR BETTER GROWTH Performed at [facility], [address] [place], [place] [address]    Report Status PENDING  Incomplete      Radiology Studies: No results found.    LOS: 2 days    Jacquelin Hawking, MD [facility] 10/22/2023, 9:07 AM   If 7PM-7AM, please contact night-coverage [email or web address]

## 2023-10-22 NOTE — Plan of Care (Signed)
  Problem: Pain Managment: Goal: General experience of comfort will improve and/or be controlled Outcome: Progressing   Problem: Safety: Goal: Ability to remain free from injury will improve Outcome: Progressing   Problem: Skin Integrity: Goal: Risk for impaired skin integrity will decrease Outcome: Progressing

## 2023-10-23 DIAGNOSIS — N3001 Acute cystitis with hematuria: Secondary | ICD-10-CM | POA: Diagnosis not present

## 2023-10-23 LAB — URINE CULTURE

## 2023-10-23 MED ORDER — HYDROCERIN EX CREA
TOPICAL_CREAM | Freq: Two times a day (BID) | CUTANEOUS | Status: DC
  Filled 2023-10-23: qty 113

## 2023-10-23 MED ORDER — CYANOCOBALAMIN 1000 MCG PO TABS: 1000.0000 ug | ORAL_TABLET | Freq: Every day | ORAL | 0 refills | Status: AC

## 2023-10-23 MED ORDER — GENTAMICIN SULFATE 40 MG/ML IJ SOLN
5.0000 mg/kg | Freq: Once | INTRAVENOUS | Status: AC
  Administered 2023-10-23: 400 mg via INTRAVENOUS
  Filled 2023-10-23: qty 10

## 2023-10-23 NOTE — Discharge Instructions (Signed)
 Tyler [name],  You were in the hospital with a UTI. Although there was concern your kidney was involved, the evidence of this does not appear to correlate with that diagnosis. The infectious disease doctors believe this is just involving your bladder. You have been treated for 5 days with appropriate antibiotics and the infectious disease doctor does not believe you need further treatment at this time. Please follow-up with your infectious disease doctors in the [facility] system. Please follow-up with your PCP.

## 2023-10-23 NOTE — Progress Notes (Signed)
 [facility]  Date of Admission:  10/19/2023     Reason for Follow Up: UTI (urinary tract infection)  Total days of antibiotics 5         ASSESSMENT:  [name] Pseudomonas is resistant to flora quinolones which is consistent with previous cultures obtained in the setting of concern for recurrent pseudomonal UTI.  Discussed recommended plan of care for 1 dose of gentamicin which would cover his current infection.  Concern given his urological history there may be other causes/differentials of his current situation.  Hydration encouraged. Recommend close follow-up with urology and infectious disease outpatient.  Okay for discharge from infectious disease standpoint.  Continue universal/standard precautions.  Remaining medical and supportive care per Internal Medicine.   PLAN:  Discontinue cefepime.  Give 1 dose of gentamicin for UTI. Close follow-up with urology and infectious disease outpatient at [facility] for discharge from ID stand point.   Principal Problem:   UTI (urinary tract infection) Active Problems:   Hyperlipidemia   Essential hypertension   CAD (coronary artery disease)   Malignant neoplasm of bladder, unspecified (HCC)   History of stroke   Chronic diastolic CHF (congestive heart failure) (HCC)   CKD (chronic kidney disease), stage III (HCC)   Complicated UTI (urinary tract infection)   Advance care planning    alfuzosin  10 mg Oral QHS   aspirin EC  81 mg Oral Daily   atorvastatin  40 mg Oral QHS   clopidogrel  75 mg Oral Daily   vitamin B-12  1,000 mcg Oral Daily   ferrous gluconate  324 mg Oral BID WC   hydrocerin   Topical BID   pantoprazole  40 mg Oral Daily    SUBJECTIVE:  Afebrile overnight with no acute events.  Tolerating cefepime with no adverse side effects.  Daughter visiting at bedside.  Has been urinating overnight at about 100 cc every 2 hours.  Delaying onset of urination has improved.  Allergies  Allergen  Reactions   Bactrim [Sulfamethoxazole-Trimethoprim] Itching and Rash     Review of Systems: Review of Systems  Constitutional:  Negative for chills, fever and weight loss.  Respiratory:  Negative for cough, shortness of breath and wheezing.   Cardiovascular:  Negative for chest pain and leg swelling.  Gastrointestinal:  Negative for abdominal pain, constipation, diarrhea, nausea and vomiting.  Skin:  Negative for rash.      OBJECTIVE: Vitals:   10/22/23 0730 10/22/23 2023 10/23/23 0400 10/23/23 0719  BP: 125/71 130/71 (!) 144/88 125/61  Pulse: 75 71 73 82  Resp: 18 18 19 16   Temp: 98.1 F (36.7 C) 98.4 F (36.9 C) 97.7 F (36.5 C) 99 F (37.2 C)  TempSrc: Oral Oral Oral Oral  SpO2: 98% 96% 97% 97%  Weight:      Height:       Body mass index is 35.22 kg/m.  Physical Exam Constitutional:      General: He is not in acute distress.    Appearance: He is well-developed.  Cardiovascular:     Rate and Rhythm: Normal rate and regular rhythm.     Heart sounds: Normal heart sounds.  Pulmonary:     Effort: Pulmonary effort is normal.     Breath sounds: Normal breath sounds.  Skin:    General: Skin is warm and dry.  Neurological:     Mental Status: He is alert.  Psychiatric:        Mood and Affect: Mood  normal.     Lab Results Lab Results  Component Value Date   WBC 8.1 10/21/2023   HGB 10.7 (L) 10/21/2023   HCT 33.5 (L) 10/21/2023   MCV 93.6 10/21/2023   PLT 153 10/21/2023    Lab Results  Component Value Date   CREATININE 1.46 (H) 10/21/2023   BUN 17 10/21/2023   NA 140 10/21/2023   K 3.7 10/21/2023   CL 109 10/21/2023   CO2 22 10/21/2023    Lab Results  Component Value Date   ALT 12 10/20/2023   AST 15 10/20/2023   ALKPHOS 54 10/20/2023   BILITOT 0.5 10/20/2023     Microbiology: Recent Results (from the past 240 hours)  Urine Culture     Status: Abnormal   Collection Time: 10/19/23  3:08 PM   Specimen: Urine, Clean Catch  Result Value Ref  Range Status   Specimen Description URINE, CLEAN CATCH  Final   Special Requests NONE  Final   Culture (A)  Final    >=100,000 COLONIES/mL PSEUDOMONAS AERUGINOSA Two isolates with different morphologies were identified as the same organism.The most resistant organism was reported. Performed at [facility], [address], [place], [place] [address]    Report Status 10/23/2023 FINAL  Final   Organism ID, Bacteria PSEUDOMONAS AERUGINOSA (A)  Final      Susceptibility   Pseudomonas aeruginosa - MIC*    CEFTAZIDIME 4 SENSITIVE Sensitive     CIPROFLOXACIN >=4 RESISTANT Resistant     GENTAMICIN <=1 SENSITIVE Sensitive     IMIPENEM <=0.25 SENSITIVE Sensitive     * >=100,000 COLONIES/mL PSEUDOMONAS AERUGINOSA    I have personally spent 32 minutes involved in face-to-face and non-face-to-face activities for this patient on the day of the visit. Professional time spent includes the following activities: Preparing to see the patient (review of tests), Obtaining and/or reviewing separately obtained history (admission/discharge record), Performing a medically appropriate examination and/or evaluation , Ordering medications/tests/procedures, referring and communicating with other health care professionals, Documenting clinical information in the EMR, Independently interpreting results (not separately reported), Communicating results to the patient/family/caregiver, Counseling and educating the patient/family/caregiver and Care coordination (not separately reported).   Tyler Eke, NP [facility]  10/23/2023  1:06 PM

## 2023-10-23 NOTE — TOC Transition Note (Signed)
 Transition of Care [facility]) - Discharge Note   Patient Details  Name: Tyler [name] MRN: [id number] Date of Birth: 02/22/1929  [facility] CM/SW Contact:  Gala Lewandowsky, RN Phone Number: 10/23/2023, 1:51 PM   Clinical Narrative:  Patient will transition home today. [facility] has received authorization from the [place] for [facility] PT Services. Case Manager did clarify with MD regarding home IV antibiotics and no home antibiotics needed. No home needs identified at this time.   Final next level of care: Home w Home Health Services Barriers to Discharge: No Barriers Identified   Patient Goals and CMS Choice Patient states their goals for this hospitalization and ongoing recovery are:: to return to home CMS Medicare.gov Compare Post Acute Care list provided to:: Patient Choice offered to / list presented to : Patient, Spouse    Discharge Plan and Services Additional resources added to the After Visit Summary for     Discharge Planning Services: CM Consult Post Acute Care Choice: Home Health          DME Arranged: N/A     HH Arranged: PT HH Agency: [facility] Date HH Agency Contacted: 10/20/23 Time HH Agency Contacted: 1515 Representative spoke with at [facility] Agency: Adele Dan  Social Drivers of Health (SDOH) Interventions SDOH Screenings   Food Insecurity: No Food Insecurity (10/20/2023)  Housing: Low Risk  (10/20/2023)  Transportation Needs: No Transportation Needs (10/20/2023)  Utilities: Not At Risk (10/20/2023)  Social Connections: Patient Declined (10/20/2023)  Tobacco Use: Low Risk  (10/20/2023)  Recent Concern: Tobacco Use - Medium Risk (08/21/2023)   Received from [facility]     Readmission Risk Interventions     No data to display

## 2023-10-23 NOTE — Progress Notes (Signed)
 Mobility Specialist Progress Note:   10/23/23 1130  Mobility  Activity Ambulated with assistance in hallway  Level of Assistance Minimal assist, patient does 75% or more  Assistive Device Front wheel walker  Distance Ambulated (ft) 60 ft  Activity Response Tolerated well  Mobility Referral Yes  Mobility visit 1 Mobility  Mobility Specialist Start Time (ACUTE ONLY) 0945  Mobility Specialist Stop Time (ACUTE ONLY) [id number]  Mobility Specialist Time Calculation (min) (ACUTE ONLY) 13 min   Pt received in bed, agreeable to mobility. MinA bed mobility and to stand. CG during ambulation. Pt c/o slight fatigue during ambulation, otherwise asx throughout. Daughter present to assist with chair follow. 1x slight leg buckle when turning RW but no LOB present. Pt returned to bed with call bell in reach and all needs met. Daughter present.   Leory Plowman  Mobility Specialist Please contact via [employer] office at [phone]

## 2023-10-23 NOTE — Discharge Summary (Signed)
 Physician Discharge Summary   Patient: Tyler [name] MRN: [id number] DOB: 05/03/1929  Admit date:     10/19/2023  Discharge date: 10/23/23  Discharge Physician: Jacquelin Hawking, MD   PCP: Sondra Come, MD   Recommendations at discharge:  PCP visit for hospital follow-up ID follow-up  Discharge Diagnoses: Principal Problem:   UTI (urinary tract infection) Active Problems:   Malignant neoplasm of bladder, unspecified (HCC)   Hyperlipidemia   Essential hypertension   CAD (coronary artery disease)   History of stroke   Chronic diastolic CHF (congestive heart failure) (HCC)   CKD (chronic kidney disease), stage III (HCC)   Complicated UTI (urinary tract infection)   Advance care planning  Resolved Problems:   * No resolved hospital problems. *  Hospital Course: Tyler [name] is a 89 y.o. male with a history of bladder cancer s/p resection, recurrent UTI, CAD status post CABG, diastolic heart failure CKD stage IIIb, CVA, AAA status post graft, right carotid artery occlusion and severe left ICA stenosis, anemia, GI bleeding with AVMs.  Patient presented secondary to dyspnea, fatigue, myalgias, dysuria and flank pain with evidence of complicated UTI on admission.  Patient started on Zosyn IV initially with transition to cefepime IV.  ID consulted secondary to complicated history.  Urine cultures pending.  Assessment and Plan:  Complicated UTI Recurrent issue of UTI.  Complicated by history of bladder cancer s/p resection.  Urine culture obtained on admission.  Infectious disease consulted.  Patient started empirically on Zosyn IV and transition to cefepime IV.  Culture significant for strep pneumoniae with resistance to fluoroquinolones. Patient given a dose of gentamicin per ID to complete a total of 5 days of antibiotics, prior to discharge home with home health.   History of malignant neoplasm of bladder Noted.  Patient follows with [facility].  Patient status post  cystoscopy with transurethral resection of bladder tumor in August 2024.  Patient status post chemo.   CAD History of CABG. No chest pain. Stable. Continue Lipitor, Plavix and aspirin.   Chronic diastolic heart failure Stable.   CKD stage IIIa Stable.   Iron deficiency anemia Stable hemoglobin. Patient is on iron supplementation as an outpatient. No evidence of ongoing hemorrhaging. IV iron given this admission.   Low-normal vitamin B12  Patient started on vitamin B12 supplementation. Continue vitamin B12.   Primary hypertension Well-controlled at this time.   History of GI bleeding Secondary to AVMs in setting of DAPT. No bleeding noted this admission. Continue Protonix.   AAA Patient has history of graft.  CT renal stone study was significant for stable aortic stent graft with residual aneurysm sac. Continue statin, Plavix, aspirin.   History of CVA History of right ICA occlusion Severe left ICA stenosis Noted.  Patient follows vascular surgeon outpatient. Continue Plavix, aspirin, Lipitor.   Hyperlipidemia Continue Lipitor   Obesity, class II Estimated body mass index is 35.22 kg/m as calculated from the following:   Height as of this encounter: 5\' 7"  (1.702 m).   Weight as of this encounter: 102 kg.   Consultants: Infectious disease Procedures performed: None  Disposition: Home health Diet recommendation: Cardiac diet    DISCHARGE MEDICATION: Allergies as of 10/23/2023       Reactions   Bactrim [sulfamethoxazole-trimethoprim] Itching, Rash        Medication List     TAKE these medications    alfuzosin 10 MG 24 hr tablet Commonly known as: UROXATRAL Take 1 tablet by mouth at bedtime.  aspirin EC 81 MG tablet Take 1 tablet (81 mg total) by mouth daily. Swallow whole. Hold if  overt external bleeding   atorvastatin 40 MG tablet Commonly known as: LIPITOR Take 40 mg by mouth at bedtime.   clopidogrel 75 MG tablet Commonly known as:  PLAVIX Take 1 tablet (75 mg total) by mouth daily. Hold plavix, resume when ok with urology What changed:  when to take this additional instructions   cyanocobalamin 1000 MCG tablet Take 1 tablet (1,000 mcg total) by mouth daily. Start taking on: October 24, 2023   ferrous gluconate 324 MG tablet Commonly known as: FERGON Take 324 mg by mouth 2 (two) times daily with a meal.   furosemide 20 MG tablet Commonly known as: LASIX Take 1 tablet (20 mg total) by mouth daily as needed (swelling). What changed: reasons to take this   metoprolol tartrate 25 MG tablet Commonly known as: LOPRESSOR Take 0.5 tablets (12.5 mg total) by mouth 2 (two) times daily. What changed:  how much to take when to take this reasons to take this   nitroGLYCERIN 0.4 MG SL tablet Commonly known as: NITROSTAT Place 1 tablet (0.4 mg total) under the tongue every 5 (five) minutes as needed for chest pain.   pantoprazole 40 MG tablet Commonly known as: Protonix Take 1 tablet (40 mg total) by mouth daily.   triamcinolone cream 0.1 % Commonly known as: KENALOG Apply 1 Application topically 2 (two) times daily. What changed:  when to take this reasons to take this        Follow-up Information     [facility] Follow up.   Contact information: [address] [place] [address] [phone]         Sondra Come, MD. Schedule an appointment as soon as possible for a visit in 1 week(s).   Specialty: Internal Medicine Why: For hospital follow-up Contact information: [address] [place] [phone]         Stevphen Rochester, NP. Schedule an appointment as soon as possible for a visit.   Specialty: Family Medicine Why: For hospital follow-up Contact information: [address] [place] [address] [phone]                Discharge Exam: BP 125/61 (BP Location: Left Arm)   Pulse 82   Temp 99 F (37.2 C) (Oral)   Resp 16    Ht 5\' 7"  (1.702 m)   Wt 102 kg Comment: 06/2023  SpO2 97%   BMI 35.22 kg/m   General exam: Appears calm and comfortable Respiratory system: Clear to auscultation. Respiratory effort normal. Cardiovascular system: S1 & S2 heard, RRR. Gastrointestinal system: Abdomen is nondistended, soft and nontender. Normal bowel sounds heard. Central nervous system: Alert and oriented. Psychiatry: Judgement and insight appear normal. Mood & affect appropriate.   Condition at discharge: stable  The results of significant diagnostics from this hospitalization (including imaging, microbiology, ancillary and laboratory) are listed below for reference.   Imaging Studies: CT Renal Stone Study Result Date: 10/19/2023 CLINICAL DATA:  Flank pain EXAM: CT ABDOMEN AND PELVIS WITHOUT CONTRAST TECHNIQUE: Multidetector CT imaging of the abdomen and pelvis was performed following the standard protocol without IV contrast. RADIATION DOSE REDUCTION: This exam was performed according to the departmental dose-optimization program which includes automated exposure control, adjustment of the mA and/or kV according to patient size and/or use of iterative reconstruction technique. COMPARISON:  02/03/2022 FINDINGS: Lower chest: Mild atelectatic changes  are noted in the left base. Minimal left pleural effusion is seen. Hepatobiliary: No focal liver abnormality is seen. No gallstones, gallbladder wall thickening, or biliary dilatation. Pancreas: Unremarkable. No pancreatic ductal dilatation or surrounding inflammatory changes. Spleen: Normal in size without focal abnormality. Adrenals/Urinary Tract: Adrenal glands are within normal limits. Kidneys are well visualized bilaterally. No renal calculi or obstructive changes are seen. The ureters are within normal limits. Bladder is partially distended. Stomach/Bowel: No obstructive or inflammatory changes of the colon are noted. The appendix is within normal limits. Small bowel and stomach  are unremarkable. Vascular/Lymphatic: Atherosclerotic calcifications of the aorta are noted. Aortic stent graft is seen in satisfactory position. The aneurysm sac is stable at 6.2 cm. The lack of contrast precludes evaluation for endoleak although no significant enlargement of the sac is identified to suggest endoleak. No lymphadenopathy is noted. Reproductive: Prostate is unremarkable. Other: No abdominal wall hernia or abnormality. No abdominopelvic ascites. Musculoskeletal: Left hip replacement is noted. Degenerative changes of lumbar spine are seen. IMPRESSION: Stable aortic stent graft with residual aneurysm sac. No focal abnormality to correspond with the given clinical history is noted. Mild left basilar atelectasis. Electronically Signed   By: Alcide Clever M.D.   On: 10/19/2023 21:43   DG Chest 1 View Result Date: 10/19/2023 CLINICAL DATA:  Acute shortness of breath. EXAM: CHEST  1 VIEW COMPARISON:  August 17, 2022. FINDINGS: The heart size and mediastinal contours are within normal limits. Status post coronary artery bypass graft. Both lungs are clear. The visualized skeletal structures are unremarkable. IMPRESSION: No active disease. Electronically Signed   By: Lupita Raider M.D.   On: 10/19/2023 16:02    Microbiology: Results for orders placed or performed during the hospital encounter of 10/19/23  Urine Culture     Status: Abnormal   Collection Time: 10/19/23  3:08 PM   Specimen: Urine, Clean Catch  Result Value Ref Range Status   Specimen Description URINE, CLEAN CATCH  Final   Special Requests NONE  Final   Culture (A)  Final    >=100,000 COLONIES/mL PSEUDOMONAS AERUGINOSA Two isolates with different morphologies were identified as the same organism.The most resistant organism was reported. Performed at [facility], [address], [place], [place] [address]    Report Status 10/23/2023 FINAL  Final   Organism ID, Bacteria PSEUDOMONAS AERUGINOSA (A)  Final       Susceptibility   Pseudomonas aeruginosa - MIC*    CEFTAZIDIME 4 SENSITIVE Sensitive     CIPROFLOXACIN >=4 RESISTANT Resistant     GENTAMICIN <=1 SENSITIVE Sensitive     IMIPENEM <=0.25 SENSITIVE Sensitive     * >=100,000 COLONIES/mL PSEUDOMONAS AERUGINOSA    Labs: CBC: Recent Labs  Lab 10/19/23 1507 10/20/23 1044 10/21/23 0953  WBC 8.2 8.1 8.1  HGB 11.1* 10.1* 10.7*  HCT 34.3* 31.8* 33.5*  MCV 94.0 94.9 93.6  PLT 172 148* 153   Basic Metabolic Panel: Recent Labs  Lab 10/19/23 1507 10/19/23 2028 10/20/23 1044 10/21/23 0953  NA 140  --  139 140  K 3.8  --  3.7 3.7  CL 108  --  110 109  CO2 22  --  22 22  GLUCOSE 112*  --  91 135*  BUN 17  --  17 17  CREATININE 1.47*  --  1.36* 1.46*  CALCIUM 8.5*  --  8.1* 8.7*  MG  --  2.1 2.0 2.0  PHOS  --  3.5 3.3 3.1   Liver  Function Tests: Recent Labs  Lab 10/19/23 1507 10/20/23 1044 10/21/23 0953  AST 17 15  --   ALT 10 12  --   ALKPHOS 58 54  --   BILITOT 0.7 0.5  --   PROT 6.4* 5.8*  --   ALBUMIN 3.3* 3.0* 3.1*     Discharge time spent: 35 minutes.  Signed: Jacquelin Hawking, MD [facility] 10/23/2023

## 2023-10-31 ENCOUNTER — Emergency Department (HOSPITAL_COMMUNITY)

## 2023-10-31 ENCOUNTER — Encounter (HOSPITAL_COMMUNITY): Payer: Self-pay

## 2023-10-31 ENCOUNTER — Emergency Department (HOSPITAL_COMMUNITY)
Admission: EM | Admit: 2023-10-31 | Discharge: 2023-11-01 | Disposition: A | Discharge status: Home / Self Care | Attending: Emergency Medicine | Admitting: Emergency Medicine

## 2023-10-31 ENCOUNTER — Other Ambulatory Visit: Payer: Self-pay

## 2023-10-31 DIAGNOSIS — Z79899 Other long term (current) drug therapy: Secondary | ICD-10-CM | POA: Diagnosis not present

## 2023-10-31 DIAGNOSIS — I959 Hypotension, unspecified: Secondary | ICD-10-CM | POA: Diagnosis present

## 2023-10-31 DIAGNOSIS — I130 Hypertensive heart and chronic kidney disease with heart failure and stage 1 through stage 4 chronic kidney disease, or unspecified chronic kidney disease: Secondary | ICD-10-CM | POA: Insufficient documentation

## 2023-10-31 DIAGNOSIS — Z7901 Long term (current) use of anticoagulants: Secondary | ICD-10-CM | POA: Insufficient documentation

## 2023-10-31 DIAGNOSIS — Z7982 Long term (current) use of aspirin: Secondary | ICD-10-CM | POA: Insufficient documentation

## 2023-10-31 DIAGNOSIS — I2510 Atherosclerotic heart disease of native coronary artery without angina pectoris: Secondary | ICD-10-CM | POA: Diagnosis not present

## 2023-10-31 DIAGNOSIS — N183 Chronic kidney disease, stage 3 unspecified: Secondary | ICD-10-CM | POA: Diagnosis not present

## 2023-10-31 DIAGNOSIS — I5032 Chronic diastolic (congestive) heart failure: Secondary | ICD-10-CM | POA: Diagnosis not present

## 2023-10-31 LAB — CBC WITH DIFFERENTIAL/PLATELET
Abs Immature Granulocytes: 0.03 10*3/uL (ref 0.00–0.07)
Basophils Absolute: 0.1 10*3/uL (ref 0.0–0.1)
Basophils Relative: 1 %
Eosinophils Absolute: 0.2 10*3/uL (ref 0.0–0.5)
Eosinophils Relative: 2 %
HCT: 34.7 % — ABNORMAL LOW (ref 39.0–52.0)
Hemoglobin: 11.2 g/dL — ABNORMAL LOW (ref 13.0–17.0)
Immature Granulocytes: 0 %
Lymphocytes Relative: 24 %
Lymphs Abs: 2 10*3/uL (ref 0.7–4.0)
MCH: 30.4 pg (ref 26.0–34.0)
MCHC: 32.3 g/dL (ref 30.0–36.0)
MCV: 94 fL (ref 80.0–100.0)
Monocytes Absolute: 0.7 10*3/uL (ref 0.1–1.0)
Monocytes Relative: 8 %
Neutro Abs: 5.3 10*3/uL (ref 1.7–7.7)
Neutrophils Relative %: 65 %
Platelets: 127 10*3/uL — ABNORMAL LOW (ref 150–400)
RBC: 3.69 MIL/uL — ABNORMAL LOW (ref 4.22–5.81)
RDW: 14.6 % (ref 11.5–15.5)
WBC: 8.2 10*3/uL (ref 4.0–10.5)
nRBC: 0 % (ref 0.0–0.2)

## 2023-10-31 LAB — COMPREHENSIVE METABOLIC PANEL WITH GFR
ALT: 13 U/L (ref 0–44)
AST: 18 U/L (ref 15–41)
Albumin: 3.2 g/dL — ABNORMAL LOW (ref 3.5–5.0)
Alkaline Phosphatase: 51 U/L (ref 38–126)
Anion gap: 11 (ref 5–15)
BUN: 23 mg/dL (ref 8–23)
CO2: 20 mmol/L — ABNORMAL LOW (ref 22–32)
Calcium: 8.2 mg/dL — ABNORMAL LOW (ref 8.9–10.3)
Chloride: 107 mmol/L (ref 98–111)
Creatinine, Ser: 1.62 mg/dL — ABNORMAL HIGH (ref 0.61–1.24)
GFR, Estimated: 40 mL/min — ABNORMAL LOW (ref >60)
Glucose, Bld: 109 mg/dL — ABNORMAL HIGH (ref 70–99)
Potassium: 4 mmol/L (ref 3.5–5.1)
Sodium: 138 mmol/L (ref 135–145)
Total Bilirubin: 0.5 mg/dL (ref 0.0–1.2)
Total Protein: 6.1 g/dL — ABNORMAL LOW (ref 6.5–8.1)

## 2023-10-31 LAB — I-STAT CG4 LACTIC ACID, ED: Lactic Acid, Venous: 0.8 mmol/L (ref 0.5–1.9)

## 2023-10-31 LAB — PROTIME-INR
INR: 1.2 (ref 0.8–1.2)
Prothrombin Time: 15 s (ref 11.4–15.2)

## 2023-10-31 MED ORDER — LACTATED RINGERS IV BOLUS
1000.0000 mL | Freq: Once | INTRAVENOUS | Status: AC
  Administered 2023-10-31: 1000 mL via INTRAVENOUS

## 2023-10-31 MED ORDER — IOHEXOL 350 MG/ML SOLN
100.0000 mL | Freq: Once | INTRAVENOUS | Status: AC | PRN
  Administered 2023-10-31: 100 mL via INTRAVENOUS

## 2023-10-31 NOTE — ED Provider Notes (Signed)
 [facility] [facility] AT [facility] Provider Note  CSN: [id number] Arrival date & time: 10/31/23 1657  Chief Complaint(s) Hypotension  HPI Tyler [name] is a 89 y.o. male who is here today after he was at the [place] and was hypotensive.  Patient currently being treated for a UTI with ciprofloxacin.  EMS reports is his initial blood pressure was 85/52.  Without any intervention, the patient's blood pressure was 110/67.  Patient overall states that he feels well, denies any weakness, abdominal pain, shortness of breath or dysuria.   Past Medical History Past Medical History:  Diagnosis Date   AAA (abdominal aortic aneurysm) (HCC)    a. 05/2014 s/p stent grafting [facility]).   Bronchitis    CAD (coronary artery disease)    a. 06/2013 VF Arrest/Cath: 3VD, EF 50%;  b. 06/2013 CABG x 4: LIMA->LAD, VG->PDA->RPL, VG->OM.   CVA (cerebral infarction)    Erectile dysfunction    Essential hypertension    GERD (gastroesophageal reflux disease)    H/O hiatal hernia    HOH (hard of hearing)    wears hearing aid in right ear   Hyperlipidemia    Osteoarthritis    Pneumonia    YRS AGO   Stroke [facility])    Patient Active Problem List   Diagnosis Date Noted   Advance care planning 10/21/2023   History of stroke 10/20/2023   Chronic diastolic CHF (congestive heart failure) (HCC) 10/20/2023   CKD (chronic kidney disease), stage III (HCC) 10/20/2023   Complicated UTI (urinary tract infection) 10/20/2023   UTI (urinary tract infection) 10/19/2023   Benign neoplasm of ascending colon    Fecal occult blood test positive    GI bleed 02/25/2022   Gross hematuria 02/25/2022   Malignant neoplasm of bladder, unspecified (HCC) 02/25/2022   Symptomatic anemia 02/25/2022   ABLA (acute blood loss anemia) 02/24/2022   Physical deconditioning 03/25/2021   Transient ischemic attack (TIA) 03/25/2021   Transient ischemic attack 03/24/2021   Stenosis of right carotid artery    Arthritis  of left hip 09/01/2014   AAA (abdominal aortic aneurysm) (HCC)    Essential hypertension    CAD (coronary artery disease)    S/P CABG x 4 06/27/2013   Hyperlipidemia    Home Medication(s) Prior to Admission medications   Medication Sig Start Date End Date Taking? Authorizing Provider  alfuzosin (UROXATRAL) 10 MG 24 hr tablet Take 1 tablet by mouth at bedtime. 08/18/23   [provider]  aspirin EC 81 MG tablet Take 1 tablet (81 mg total) by mouth daily. Swallow whole. Hold if  overt external bleeding 02/27/22   Albertine Grates, MD  atorvastatin (LIPITOR) 40 MG tablet Take 40 mg by mouth at bedtime.    [provider]  clopidogrel (PLAVIX) 75 MG tablet Take 1 tablet (75 mg total) by mouth daily. Hold plavix, resume when ok with urology Patient taking differently: Take 75 mg by mouth in the morning. 02/27/22   Albertine Grates, MD  cyanocobalamin 1000 MCG tablet Take 1 tablet (1,000 mcg total) by mouth daily. 10/24/23   Narda Bonds, MD  ferrous gluconate (FERGON) 324 MG tablet Take 324 mg by mouth 2 (two) times daily with a meal.    [provider]  furosemide (LASIX) 20 MG tablet Take 1 tablet (20 mg total) by mouth daily as needed (swelling). Patient taking differently: Take 20 mg by mouth daily as needed (for swelling). 03/02/22   Lyn Records, MD  metoprolol tartrate (  LOPRESSOR) 25 MG tablet Take 0.5 tablets (12.5 mg total) by mouth 2 (two) times daily. Patient taking differently: Take 25 mg by mouth daily as needed (for sBP greater than 169). 08/29/13   Lyn Records, MD  nitroGLYCERIN (NITROSTAT) 0.4 MG SL tablet Place 1 tablet (0.4 mg total) under the tongue every 5 (five) minutes as needed for chest pain. 10/29/21   Lyn Records, MD  pantoprazole (PROTONIX) 40 MG tablet Take 1 tablet (40 mg total) by mouth daily. 04/05/22 10/18/24  Lanae Boast, MD  triamcinolone cream (KENALOG) 0.1 % Apply 1 Application topically 2 (two) times daily. Patient taking differently: Apply 1 Application  topically daily as needed (for itching). 12/18/22   Gareth Eagle, PA-C                                                                                                                                    Past Surgical History Past Surgical History:  Procedure Laterality Date   COLONOSCOPY WITH PROPOFOL N/A 02/26/2022   Procedure: COLONOSCOPY WITH PROPOFOL;  Surgeon: Jenel Lucks, MD;  Location: [facility];  Service: Gastroenterology;  Laterality: N/A;   CORONARY ARTERY BYPASS GRAFT N/A 06/27/2013   Procedure: CORONARY ARTERY BYPASS GRAFTING (CABG);  Surgeon: Kerin Perna, MD;  Location: [facility] OR;  Service: Open Heart Surgery;  Laterality: N/A;  times four utilizing the left internal mammary artery and the right greater saphenous vein harvested endoscopically   ENDOVEIN HARVEST OF GREATER SAPHENOUS VEIN Right 06/27/2013   Procedure: ENDOVEIN HARVEST OF GREATER SAPHENOUS VEIN;  Surgeon: Kerin Perna, MD;  Location: [facility] OR;  Service: Open Heart Surgery;  Laterality: Right;   ESOPHAGOGASTRODUODENOSCOPY (EGD) WITH PROPOFOL N/A 02/25/2022   Procedure: ESOPHAGOGASTRODUODENOSCOPY (EGD) WITH PROPOFOL;  Surgeon: Jeani Hawking, MD;  Location: [facility];  Service: Gastroenterology;  Laterality: N/A;   EYE SURGERY     bilateral cataracts   GIVENS CAPSULE STUDY N/A 02/26/2022   Procedure: GIVENS CAPSULE STUDY;  Surgeon: Jenel Lucks, MD;  Location: [facility];  Service: Gastroenterology;  Laterality: N/A;   HEMOSTASIS CLIP PLACEMENT  02/26/2022   Procedure: HEMOSTASIS CLIP PLACEMENT;  Surgeon: Jenel Lucks, MD;  Location: [facility];  Service: Gastroenterology;;   [place] EAR SURGERY Left 1982   INNER EAR SURGERY     severed a nerve in his left ear.    LEFT HEART CATHETERIZATION WITH CORONARY ANGIOGRAM N/A 06/27/2013   Procedure: LEFT HEART CATHETERIZATION WITH CORONARY ANGIOGRAM;  Surgeon: Lesleigh Noe, MD;  Location: [facility] CATH LAB;  Service: Cardiovascular;  Laterality: N/A;  iabp  insertion, cpr, intubation, emergent cvts   POLYPECTOMY  02/26/2022   Procedure: POLYPECTOMY;  Surgeon: Jenel Lucks, MD;  Location: [facility];  Service: Gastroenterology;;   TEE WITHOUT CARDIOVERSION N/A 06/27/2013   Procedure: TRANSESOPHAGEAL ECHOCARDIOGRAM (TEE);  Surgeon: Kerin Perna, MD;  Location: [facility] OR;  Service: Open Heart Surgery;  Laterality:  N/A;   TONSILLECTOMY     TOTAL HIP ARTHROPLASTY Left 09/01/2014   Procedure: TOTAL HIP ARTHROPLASTY;  Surgeon: Nestor Lewandowsky, MD;  Location: [facility] OR;  Service: Orthopedics;  Laterality: Left;   Family History Family History  Problem Relation Age of Onset   Heart disease Sister    Heart disease Brother    Heart attack Sister    Heart attack Brother    Colon cancer Neg Hx    Hypertension Neg Hx    Stroke Neg Hx     Social History Social History   Tobacco Use   Smoking status: Never   Smokeless tobacco: Never  Vaping Use   Vaping status: Never Used  Substance Use Topics   Alcohol use: Never    Comment: Rare   Drug use: No   Allergies Bactrim [sulfamethoxazole-trimethoprim]  Review of Systems Review of Systems  Physical Exam Vital Signs  I have reviewed the triage vital signs BP 131/74   Pulse 74   Temp 98.2 F (36.8 C) (Oral)   Resp 15   Ht 5\' 7"  (1.702 m)   Wt 102 kg   SpO2 100%   BMI 35.22 kg/m   Physical Exam Vitals reviewed.  Constitutional:      Appearance: He is not toxic-appearing.  Eyes:     Pupils: Pupils are equal, round, and reactive to light.  Cardiovascular:     Rate and Rhythm: Normal rate.     Pulses: Normal pulses.  Abdominal:     General: Abdomen is flat.     Palpations: Abdomen is soft. There is no mass.     Tenderness: There is no abdominal tenderness.  Musculoskeletal:        General: Normal range of motion.     Cervical back: Normal range of motion.  Skin:    General: Skin is warm and dry.  Neurological:     General: No focal deficit present.     Mental Status: He is  alert and oriented to person, place, and time.     ED Results and Treatments Labs (all labs ordered are listed, but only abnormal results are displayed) Labs Reviewed  COMPREHENSIVE METABOLIC PANEL WITH GFR - Abnormal; Notable for the following components:      Result Value   CO2 20 (*)    Glucose, Bld 109 (*)    Creatinine, Ser 1.62 (*)    Calcium 8.2 (*)    Total Protein 6.1 (*)    Albumin 3.2 (*)    GFR, Estimated 40 (*)    All other components within normal limits  CBC WITH DIFFERENTIAL/PLATELET - Abnormal; Notable for the following components:   RBC 3.69 (*)    Hemoglobin 11.2 (*)    HCT 34.7 (*)    Platelets 127 (*)    All other components within normal limits  CULTURE, BLOOD (ROUTINE X 2)  CULTURE, BLOOD (ROUTINE X 2)  PROTIME-INR  URINALYSIS, W/ REFLEX TO CULTURE (INFECTION SUSPECTED)  I-STAT CG4 LACTIC ACID, ED  Radiology CT Angio Chest/Abd/Pel for Dissection W and/or Wo Contrast Result Date: 10/31/2023 CLINICAL DATA:  Acute aortic syndrome EXAM: CT ANGIOGRAPHY CHEST, ABDOMEN AND PELVIS TECHNIQUE: Non-contrast CT of the chest was initially obtained. Multidetector CT imaging through the chest, abdomen and pelvis was performed using the standard protocol during bolus administration of intravenous contrast. Multiplanar reconstructed images and MIPs were obtained and reviewed to evaluate the vascular anatomy. RADIATION DOSE REDUCTION: This exam was performed according to the departmental dose-optimization program which includes automated exposure control, adjustment of the mA and/or kV according to patient size and/or use of iterative reconstruction technique. CONTRAST:  OMNIPAQUE IOHEXOL 350 MG/ML SOLN COMPARISON:  10/19/2023 FINDINGS: CTA CHEST FINDINGS Cardiovascular: Status post coronary artery bypass grafting. Global cardiac size within normal limits.  No pericardial effusion. Central pulmonary arteries are of normal caliber. Thoracic aorta is normal in course and caliber. No intramural hematoma or dissection. Moderate mixed atherosclerotic plaque. Mediastinum/Nodes: No enlarged mediastinal, hilar, or axillary lymph nodes. Thyroid gland, trachea, and esophagus demonstrate no significant findings. Lungs/Pleura: 8 mm sub solid pulmonary nodule within the right upper lobe is indeterminate, possibly inflammatory in nature though an underlying adenomatous lesion is not excluded. Scattered ground-glass opacity likely relates to underlying atelectasis. Minimal subpleural pulmonary fibrosis. No pneumothorax or pleural effusion. Musculoskeletal: No acute bone abnormality. Review of the MIP images confirms the above findings. CTA ABDOMEN AND PELVIS FINDINGS VASCULAR Aorta: Infrarenal abdominal aortic aneurysm status post endovascular repair utilizing a infrarenal bifurcated stent graft is again seen. The aneurysm sac is thrombosed. However, there is interval increase in size of the aneurysm sac when compared to remote prior examination of 12/30/11/2023, now measuring 6.6 cm in greatest diameter, previously measuring 6.1 cm. Delayed phase imaging was not obtained to assess for the presence of an indirect (type 2) endoleak. No evidence of type 1 or type 3 endoleak. Celiac: 50% stenosis of the origin, unchanged. No dissection or aneurysm. SMA: Patent without evidence of aneurysm, dissection, vasculitis or significant stenosis. Replaced right hepatic artery Renals: Both renal arteries are patent without evidence of aneurysm, dissection, vasculitis, fibromuscular dysplasia or significant stenosis. IMA: Occluded at the origin and reconstituted by the marginal artery. Widely patent through its mid segment. Peripheral branches are not opacified. Inflow: Poorly opacified due to bolus timing. Stable 3.1 cm aneurysm the right common iliac artery. Moderate atherosclerotic calcification  Veins: No obvious venous abnormality within the limitations of this arterial phase study. Review of the MIP images confirms the above findings. NON-VASCULAR Hepatobiliary: No focal liver abnormality is seen. No gallstones, gallbladder wall thickening, or biliary dilatation. Pancreas: Unremarkable Spleen: Normal in size without focal abnormality. Adrenals/Urinary Tract: Adrenal glands are unremarkable. Kidneys are normal, without renal calculi, focal lesion, or hydronephrosis. Bladder is unremarkable. Stomach/Bowel: Stomach is within normal limits. Appendix appears normal. No evidence of bowel wall thickening, distention, or inflammatory changes. Lymphatic: No pathologic adenopathy within the abdomen and pelvis Reproductive: Moderate prostatic hypertrophy Other: No abdominal wall hernia or abnormality. No abdominopelvic ascites. Musculoskeletal: Left total hip arthroplasty. Osseous structures are otherwise age-appropriate. No acute bone abnormality. Review of the MIP images confirms the above findings. IMPRESSION: 1. No evidence of acute aortic syndrome. 2. Infrarenal abdominal aortic aneurysm status post endovascular repair utilizing a bifurcated stent graft. The aneurysm sac is thrombosed. However, there is interval increase in size of the aneurysm sac when compared to remote prior examination of 12/30/11/2023, now measuring 6.6 cm in greatest diameter, previously measuring 6.1 cm. Delayed phase imaging was not obtained to  assess for the presence of an indirect (type 2) endoleak. No evidence of type 1 or type 3 endoleak. Nonemergent endograft protocol CTA abdomen pelvis is recommended for further evaluation. 3. 8 mm sub solid pulmonary nodule within the right upper lobe. 4. 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 Electronically Signed   By: Helyn Numbers M.D.   On: 10/31/2023 22:41   DG Chest Port 1 View Result Date: 10/31/2023 CLINICAL DATA:  Sepsis EXAM: PORTABLE CHEST 1 VIEW COMPARISON:  Chest  radiograph dated 10/19/2023 FINDINGS: Normal lung volumes. Bibasilar patchy opacities. Blunting of the bilateral costophrenic angles. No pneumothorax. The heart size and mediastinal contours are within normal limits. Median sternotomy wires are nondisplaced. IMPRESSION: 1. Bibasilar patchy opacities, which may represent atelectasis, aspiration, or pneumonia. 2. Blunting of the bilateral costophrenic angles, which may represent small pleural effusions. Electronically Signed   By: Agustin Cree M.D.   On: 10/31/2023 20:05    Pertinent labs & imaging results that were available during my care of the patient were reviewed by me and considered in my medical decision making (see MDM for details).  Medications Ordered in ED Medications  lactated ringers bolus 1,000 mL (0 mLs Intravenous Stopped 10/31/23 2233)  iohexol (OMNIPAQUE) 350 MG/ML injection 100 mL (100 mLs Intravenous Contrast Given 10/31/23 2007)                                                                                                                                     Procedures Procedures  (including critical care time)  Medical Decision Making / ED Course   This patient presents to the ED for concern of hypotension since resolved, this involves an extensive number of treatment options, and is a complaint that carries with it a high risk of complications and morbidity.  The differential diagnosis includes sepsis, dehydration, AAA, faulty blood pressure reading.  MDM: On exam, patient overall looks well.  He is nontachycardic, nontachypneic, normotensive.  Patient does have a history of atrial flutter and does take metoprolol, so heart rate may not be reliable.  Will check lactic acid, CBC, blood cultures.  Also provide the patient with IV fluids.  Patient with a history of AAA, will obtain imaging of the patient's aorta.  I do not believe patient has sepsis at this time.  Reassessment 9:05 PM-called radiology at 8:30 PM after I was  concerned about the patient's CT angiography study given aneurysm with what appears to be large endoleak..  Was told that it would be an expedited read.  Still awaiting read.  Reassessment 10:50 PM-patient CT scan read.  Patient has a slight interval increase in his aortic aneurysm size.  Spoke with Dr. Genevive Bi vascular surgery who believe the patient was appropriate for outpatient follow-up.  While the patient is been with [place] here in the emergency room he has had normal blood pressures the entire time.  He is been 130/70.  Blood work overall looks normal.  Patient feels fine, would like to go home, and in the absence of any abnormal vital signs here in the ER, do not have a compelling reason to keep him.  He will follow-up with his PCP and vascular surgery.   Additional history obtained: -Additional history obtained from daughter at bedside -External records from outside source obtained and reviewed including: Chart review including previous notes, labs, imaging, consultation notes   Lab Tests: -I ordered, reviewed, and interpreted labs.   The pertinent results include:   Labs Reviewed  COMPREHENSIVE METABOLIC PANEL WITH GFR - Abnormal; Notable for the following components:      Result Value   CO2 20 (*)    Glucose, Bld 109 (*)    Creatinine, Ser 1.62 (*)    Calcium 8.2 (*)    Total Protein 6.1 (*)    Albumin 3.2 (*)    GFR, Estimated 40 (*)    All other components within normal limits  CBC WITH DIFFERENTIAL/PLATELET - Abnormal; Notable for the following components:   RBC 3.69 (*)    Hemoglobin 11.2 (*)    HCT 34.7 (*)    Platelets 127 (*)    All other components within normal limits  CULTURE, BLOOD (ROUTINE X 2)  CULTURE, BLOOD (ROUTINE X 2)  PROTIME-INR  URINALYSIS, W/ REFLEX TO CULTURE (INFECTION SUSPECTED)  I-STAT CG4 LACTIC ACID, ED      EKG atrial flutter, no acute ischemia  EKG Interpretation Date/Time:  Tuesday October 31 2023 17:15:38 EDT Ventricular Rate:  76 PR  Interval:    QRS Duration:  120 QT Interval:  457 QTC Calculation: 514 R Axis:   69  Text Interpretation: Atrial flutter with predominant 3:1 AV block Nonspecific intraventricular conduction delay Anterior infarct, old Confirmed by Anders Simmonds [phone]) on 10/31/2023 7:27:55 PM         Imaging Studies ordered: I ordered imaging studies including CTA of the chest abdomen pelvis, chest x-ray I independently visualized and interpreted imaging. I agree with the radiologist interpretation   Medicines ordered and prescription drug management: Meds ordered this encounter  Medications   lactated ringers bolus 1,000 mL   iohexol (OMNIPAQUE) 350 MG/ML injection 100 mL    -I have reviewed the patients home medicines and have made adjustments as needed  Critical interventions   Consultations Obtained: I requested consultation with the vascular surgery,  and discussed lab and imaging findings as well as pertinent plan - they recommend: Outpatient follow-up   Cardiac Monitoring: The patient was maintained on a cardiac monitor.  I personally viewed and interpreted the cardiac monitored which showed an underlying rhythm of: Irregular rhythm, nontachycardic  Social Determinants of Health:  Factors impacting patients care include: Advanced age, multiple medical comorbidities including AAA.   Reevaluation: After the interventions noted above, I reevaluated the patient and found that they have :improved  Co morbidities that complicate the patient evaluation  Past Medical History:  Diagnosis Date   AAA (abdominal aortic aneurysm) (HCC)    a. 05/2014 s/p stent grafting [facility]).   Bronchitis    CAD (coronary artery disease)    a. 06/2013 VF Arrest/Cath: 3VD, EF 50%;  b. 06/2013 CABG x 4: LIMA->LAD, VG->PDA->RPL, VG->OM.   CVA (cerebral infarction)    Erectile dysfunction    Essential hypertension    GERD (gastroesophageal reflux disease)    H/O hiatal hernia    HOH (hard of  hearing)    wears hearing aid in right ear  Hyperlipidemia    Osteoarthritis    Pneumonia    YRS AGO   Stroke [facility])       Dispostion: I considered admission for this patient, however the patient's reassuring workup, imaging and observation.  Believe he is appropriate for discharge.     Final Clinical Impression(s) / ED Diagnoses Final diagnoses:  Hypotensive episode     @PCDICTATION @    Anders Simmonds T, DO 10/31/23 2259

## 2023-10-31 NOTE — Discharge Instructions (Signed)
 While you were in the emergency room, you had blood work done that overall was normal.  Your EKG was normal for you.  Your CT scan did show a small increase in the size of your aorta.  I spoke with the vascular surgeon on-call, and they are going to reach out to you to set up a follow-up appointment.  This likely will not require any intervention, but they will want to keep an eye on it.  While you were here, your blood pressure was normal for the entire time you are in the emergency room.  Please follow-up with your primary care doctor within 1 week.

## 2023-10-31 NOTE — ED Notes (Signed)
 Patient transported to CT

## 2023-10-31 NOTE — ED Triage Notes (Signed)
 Patient bib EMS from the [place] for hypotension. EMS reports that patients BP was 85/52 initially and has now came up to 110/68.   He was discharged from the hospital last week with a UTI and is currently taking Ciprofloxacin.   Patient A&Ox3, disoriented to time.

## 2023-11-05 LAB — CULTURE, BLOOD (ROUTINE X 2)
Culture: NO GROWTH
Culture: NO GROWTH
Special Requests: ADEQUATE

## 2023-11-29 ENCOUNTER — Encounter (HOSPITAL_COMMUNITY): Payer: Self-pay

## 2023-11-29 ENCOUNTER — Other Ambulatory Visit: Payer: Self-pay

## 2023-11-29 ENCOUNTER — Emergency Department (HOSPITAL_COMMUNITY)

## 2023-11-29 ENCOUNTER — Emergency Department (HOSPITAL_COMMUNITY): Admission: EM | Admit: 2023-11-29 | Discharge: 2023-11-29 | Disposition: A | Discharge status: Home / Self Care

## 2023-11-29 DIAGNOSIS — M545 Low back pain, unspecified: Secondary | ICD-10-CM | POA: Insufficient documentation

## 2023-11-29 DIAGNOSIS — Z7982 Long term (current) use of aspirin: Secondary | ICD-10-CM | POA: Diagnosis not present

## 2023-11-29 DIAGNOSIS — Z7901 Long term (current) use of anticoagulants: Secondary | ICD-10-CM | POA: Diagnosis not present

## 2023-11-29 DIAGNOSIS — I2510 Atherosclerotic heart disease of native coronary artery without angina pectoris: Secondary | ICD-10-CM | POA: Diagnosis not present

## 2023-11-29 LAB — COMPREHENSIVE METABOLIC PANEL WITH GFR
ALT: 9 U/L (ref 0–44)
AST: 29 U/L (ref 15–41)
Albumin: 3.2 g/dL — ABNORMAL LOW (ref 3.5–5.0)
Alkaline Phosphatase: 48 U/L (ref 38–126)
Anion gap: 8 (ref 5–15)
BUN: 23 mg/dL (ref 8–23)
CO2: 22 mmol/L (ref 22–32)
Calcium: 8.6 mg/dL — ABNORMAL LOW (ref 8.9–10.3)
Chloride: 108 mmol/L (ref 98–111)
Creatinine, Ser: 1.42 mg/dL — ABNORMAL HIGH (ref 0.61–1.24)
GFR, Estimated: 46 mL/min — ABNORMAL LOW (ref >60)
Glucose, Bld: 88 mg/dL (ref 70–99)
Potassium: 5.1 mmol/L (ref 3.5–5.1)
Sodium: 138 mmol/L (ref 135–145)
Total Bilirubin: 1.7 mg/dL — ABNORMAL HIGH (ref 0.0–1.2)
Total Protein: 6 g/dL — ABNORMAL LOW (ref 6.5–8.1)

## 2023-11-29 LAB — CBC WITH DIFFERENTIAL/PLATELET
Abs Immature Granulocytes: 0.03 10*3/uL (ref 0.00–0.07)
Basophils Absolute: 0.1 10*3/uL (ref 0.0–0.1)
Basophils Relative: 1 %
Eosinophils Absolute: 0.1 10*3/uL (ref 0.0–0.5)
Eosinophils Relative: 2 %
HCT: 34.3 % — ABNORMAL LOW (ref 39.0–52.0)
Hemoglobin: 11 g/dL — ABNORMAL LOW (ref 13.0–17.0)
Immature Granulocytes: 0 %
Lymphocytes Relative: 24 %
Lymphs Abs: 2 10*3/uL (ref 0.7–4.0)
MCH: 30.1 pg (ref 26.0–34.0)
MCHC: 32.1 g/dL (ref 30.0–36.0)
MCV: 93.7 fL (ref 80.0–100.0)
Monocytes Absolute: 0.6 10*3/uL (ref 0.1–1.0)
Monocytes Relative: 7 %
Neutro Abs: 5.5 10*3/uL (ref 1.7–7.7)
Neutrophils Relative %: 66 %
Platelets: 163 10*3/uL (ref 150–400)
RBC: 3.66 MIL/uL — ABNORMAL LOW (ref 4.22–5.81)
RDW: 15.1 % (ref 11.5–15.5)
WBC: 8.4 10*3/uL (ref 4.0–10.5)
nRBC: 0 % (ref 0.0–0.2)

## 2023-11-29 LAB — URINALYSIS, ROUTINE W REFLEX MICROSCOPIC
Bacteria, UA: NONE SEEN
Bilirubin Urine: NEGATIVE
Glucose, UA: NEGATIVE mg/dL
Ketones, ur: NEGATIVE mg/dL
Leukocytes,Ua: NEGATIVE
Nitrite: NEGATIVE
Protein, ur: NEGATIVE mg/dL
Specific Gravity, Urine: 1.013 (ref 1.005–1.030)
pH: 5 (ref 5.0–8.0)

## 2023-11-29 LAB — AMMONIA: Ammonia: 34 umol/L (ref 9–35)

## 2023-11-29 MED ORDER — DICLOFENAC SODIUM 1 % EX GEL: 4.0000 g | Freq: Four times a day (QID) | CUTANEOUS | Status: DC

## 2023-11-29 MED ORDER — LIDOCAINE 5 % EX PTCH
1.0000 | MEDICATED_PATCH | CUTANEOUS | Status: DC
  Administered 2023-11-29: 1 via TRANSDERMAL
  Filled 2023-11-29: qty 1

## 2023-11-29 MED ORDER — LIDOCAINE 5 % EX PTCH: 1.0000 | MEDICATED_PATCH | CUTANEOUS | 0 refills | Status: DC

## 2023-11-29 MED ORDER — IOHEXOL 350 MG/ML SOLN
75.0000 mL | Freq: Once | INTRAVENOUS | Status: AC | PRN
  Administered 2023-11-29: 75 mL via INTRAVENOUS

## 2023-11-29 NOTE — ED Notes (Signed)
 Patient pivoted and sat in the wheelchair from the stretcher. Patients daughter feels comfortable helping him transfer when discharged home. EDP Tee notified.

## 2023-11-29 NOTE — Discharge Instructions (Addendum)
 Your workup today was reassuring.  Please apply the Voltaren gel 4 times daily.  You may also use the lidocaine  patches every 12 hours.  Take 1000 mg of Tylenol  every 8 hours as needed for pain.  You may schedule a follow-up appointment with the neurosurgeon at the number provided.  They may be able to discuss some nonsurgical treatment such as injections as needed.  Please call your physical therapist tomorrow.  Return to the ER for worsening symptoms.

## 2023-11-29 NOTE — ED Provider Notes (Signed)
 [place] EMERGENCY DEPARTMENT AT [facility] Provider Note   CSN: [id number] Arrival date & time: 11/29/23  1552     History  Chief Complaint  Patient presents with   Altered Mental Status    Tyler [name] is a 89 y.o. male.  89 year old male with past medical history of coronary artery disease, CVA, and AAA presenting to the emergency department today with altered mental status.  The patient's family states that he was complaining of worsening back pain than normal earlier today.  He was seen here last month and was fully evaluated for this.  His workup was unremarkable.  He was sent home with home health.  He has been working with them but has been complaining of worsening back pain.  He denies any focal weakness, numbness, or tingling.  Denies any bowel or bladder dysfunction has not had any saddle anesthesia.  The patient's family states that he was more confused than normal this morning.  He does not have any history of dementia.   Altered Mental Status Presenting symptoms: confusion        Home Medications Prior to Admission medications   Medication Sig Start Date End Date Taking? Authorizing Provider  diclofenac Sodium (VOLTAREN) 1 % GEL Apply 4 g topically 4 (four) times daily. 11/29/23  Yes Carin Charleston, MD  lidocaine  (LIDODERM ) 5 % Place 1 patch onto the skin daily. Remove & Discard patch within 12 hours or as directed by MD 11/29/23  Yes Carin Charleston, MD  alfuzosin  (UROXATRAL ) 10 MG 24 hr tablet Take 1 tablet by mouth at bedtime. 08/18/23   [provider]  aspirin  EC 81 MG tablet Take 1 tablet (81 mg total) by mouth daily. Swallow whole. Hold if  overt external bleeding 02/27/22   Lavaughn Portland, MD  atorvastatin  (LIPITOR) 40 MG tablet Take 40 mg by mouth at bedtime.    [provider]  clopidogrel  (PLAVIX ) 75 MG tablet Take 1 tablet (75 mg total) by mouth daily. Hold plavix , resume when ok with urology Patient taking differently: Take 75 mg by mouth  in the morning. 02/27/22   Lavaughn Portland, MD  cyanocobalamin  1000 MCG tablet Take 1 tablet (1,000 mcg total) by mouth daily. 10/24/23   Verlyn Goad, MD  ferrous gluconate  (FERGON) 324 MG tablet Take 324 mg by mouth 2 (two) times daily with a meal.    [provider]  furosemide  (LASIX ) 20 MG tablet Take 1 tablet (20 mg total) by mouth daily as needed (swelling). Patient taking differently: Take 20 mg by mouth daily as needed (for swelling). 03/02/22   Arty Binning, MD  metoprolol  tartrate (LOPRESSOR ) 25 MG tablet Take 0.5 tablets (12.5 mg total) by mouth 2 (two) times daily. Patient taking differently: Take 25 mg by mouth daily as needed (for sBP greater than 169). 08/29/13   Arty Binning, MD  nitroGLYCERIN  (NITROSTAT ) 0.4 MG SL tablet Place 1 tablet (0.4 mg total) under the tongue every 5 (five) minutes as needed for chest pain. 10/29/21   Arty Binning, MD  pantoprazole  (PROTONIX ) 40 MG tablet Take 1 tablet (40 mg total) by mouth daily. 04/05/22 10/18/24  Lesa Rape, MD  triamcinolone  cream (KENALOG ) 0.1 % Apply 1 Application topically 2 (two) times daily. Patient taking differently: Apply 1 Application topically daily as needed (for itching). 12/18/22   Robinson, John K, PA-C      Allergies    Bactrim  [sulfamethoxazole -trimethoprim ]    Review of Systems  Review of Systems  Musculoskeletal:  Positive for back pain.  Psychiatric/Behavioral:  Positive for confusion.   All other systems reviewed and are negative.   Physical Exam Updated Vital Signs BP (!) 150/78   Pulse (!) 59   Temp 97.8 F (36.6 C) (Oral)   Resp 17   Ht 5\' 7"  (1.702 m)   Wt 102 kg   SpO2 100%   BMI 35.22 kg/m  Physical Exam Vitals and nursing note reviewed.   Gen: NAD Eyes: PERRL, EOMI HEENT: no oropharyngeal swelling Neck: trachea midline, no meningismus Resp: clear to auscultation bilaterally Card: RRR, no murmurs, rubs, or gallops Abd: nontender, nondistended Extremities: no calf tenderness, no  edema MSK: The patient is tender over the mid and lower lumbar spine with no step-offs or deformities noted Vascular: 2+ radial pulses bilaterally, 2+ DP pulses bilaterally Neuro: Cranial nerves intact, equal strength and sensation throughout bilateral upper and lower extremities Skin: no rashes Psyc: acting appropriately   ED Results / Procedures / Treatments   Labs (all labs ordered are listed, but only abnormal results are displayed) Labs Reviewed  CBC WITH DIFFERENTIAL/PLATELET - Abnormal; Notable for the following components:      Result Value   RBC 3.66 (*)    Hemoglobin 11.0 (*)    HCT 34.3 (*)    All other components within normal limits  COMPREHENSIVE METABOLIC PANEL WITH GFR - Abnormal; Notable for the following components:   Creatinine, Ser 1.42 (*)    Calcium  8.6 (*)    Total Protein 6.0 (*)    Albumin  3.2 (*)    Total Bilirubin 1.7 (*)    GFR, Estimated 46 (*)    All other components within normal limits  URINALYSIS, ROUTINE W REFLEX MICROSCOPIC - Abnormal; Notable for the following components:   Hgb urine dipstick SMALL (*)    All other components within normal limits  URINE CULTURE  AMMONIA    EKG None  Radiology CT Angio Abd/Pel W and/or Wo Contrast Result Date: 11/29/2023 CLINICAL DATA:  Abdominal aortic aneurysm. Altered mental status. Chronic back pain, worsening today. EXAM: CTA ABDOMEN AND PELVIS WITHOUT AND WITH CONTRAST TECHNIQUE: Multidetector CT imaging of the abdomen and pelvis was performed using the standard protocol during bolus administration of intravenous contrast. Multiplanar reconstructed images and MIPs were obtained and reviewed to evaluate the vascular anatomy. RADIATION DOSE REDUCTION: This exam was performed according to the departmental dose-optimization program which includes automated exposure control, adjustment of the mA and/or kV according to patient size and/or use of iterative reconstruction technique. CONTRAST:  75mL OMNIPAQUE   IOHEXOL  350 MG/ML SOLN COMPARISON:  10/31/2023 FINDINGS: VASCULAR Aorta: Status post endograft repair of infrarenal abdominal aortic aneurysm. Maximum sac size 6.3 cm compared to 6.6 cm previously. No delayed images were performed to assess for type 2 endoleak. No type 1 or type 3 endoleak. Celiac: Patent without evidence of aneurysm, dissection, vasculitis or significant stenosis. SMA: Patent without evidence of aneurysm, dissection, vasculitis or significant stenosis. Renals: Both renal arteries are patent without evidence of aneurysm, dissection, vasculitis, fibromuscular dysplasia or significant stenosis. IMA: Fills via collaterals. Inflow: Endograft extends into the common iliac arteries bilaterally without type 1 or type 3 endoleak. Appearance is stable. Heavily calcified external iliac arteries. Proximal Outflow: Calcified common femoral arteries. No aneurysm, stenosis or dissection. Veins: No obvious venous abnormality within the limitations of this arterial phase study. Review of the MIP images confirms the above findings. NON-VASCULAR Lower chest: Left base atelectasis.  No effusions. Hepatobiliary:  No focal hepatic abnormality. Gallbladder unremarkable. Pancreas: No focal abnormality or ductal dilatation. Spleen: No focal abnormality.  Normal size. Adrenals/Urinary Tract: No adrenal abnormality. No focal renal abnormality. No stones or hydronephrosis. Urinary bladder is unremarkable. Stomach/Bowel: Normal appendix. Stomach, large and small bowel grossly unremarkable. Lymphatic: No adenopathy Reproductive: Prostate enlargement. Other: No free fluid or free air. Musculoskeletal: Prior left hip replacement. No acute bony abnormality. IMPRESSION: VASCULAR Infrarenal abdominal aortic aneurysm status post stent graft repair. Aneurysm sac size 6.3 cm compared to 6.6 cm previously. No delayed imaging was performed to assess for type 2 endoleak. No type 1 or type 3 endoleak. No acute findings in the abdomen or  pelvis. Left base atelectasis. Prostate enlargement. NON-VASCULAR Electronically Signed   By: Janeece Mechanic M.D.   On: 11/29/2023 19:43   CT Head Wo Contrast Result Date: 11/29/2023 CLINICAL DATA:  Memory loss EXAM: CT HEAD WITHOUT CONTRAST TECHNIQUE: Contiguous axial images were obtained from the base of the skull through the vertex without intravenous contrast. RADIATION DOSE REDUCTION: This exam was performed according to the departmental dose-optimization program which includes automated exposure control, adjustment of the mA and/or kV according to patient size and/or use of iterative reconstruction technique. COMPARISON:  MRI brain 03/25/2021. FINDINGS: Brain: No evidence of acute infarction, hemorrhage, hydrocephalus, extra-axial collection or mass lesion/mass effect. Again seen is moderate diffuse atrophy. There is stable moderate periventricular and deep white matter hypodensity. Small old cortical infarcts are seen in the left parietal region Vascular: And right frontal region. Skull: Atherosclerotic calcifications are present within the cavernous internal carotid arteries. Sinuses/Orbits: No acute finding. Other: None. IMPRESSION: 1. No acute intracranial process. 2. Stable atrophy and chronic small vessel ischemic changes. 3. Small old cortical infarcts in the left parietal region. Electronically Signed   By: Tyron Gallon M.D.   On: 11/29/2023 19:37   DG Chest Portable 1 View Result Date: 11/29/2023 CLINICAL DATA:  AMS EXAM: PORTABLE CHEST 1 VIEW COMPARISON:  October 31, 2023 FINDINGS: The heart size and mediastinal contours are within normal limits. Both lungs are clear. The visualized skeletal structures are unremarkable. IMPRESSION: No active disease. Electronically Signed   By: Fredrich Jefferson M.D.   On: 11/29/2023 17:17    Procedures Procedures    Medications Ordered in ED Medications  lidocaine  (LIDODERM ) 5 % 1 patch (1 patch Transdermal Patch Applied 11/29/23 1822)  iohexol  (OMNIPAQUE ) 350  MG/ML injection 75 mL (75 mLs Intravenous Contrast Given 11/29/23 1932)    ED Course/ Medical Decision Making/ A&P                                 Medical Decision Making 89 year old male with past medical history of coronary artery disease, CVA, and AAA in the past presenting to the emergency department today with concern for confusion and low back pain.  The patient is alert and oriented x 2 but is unsure of the year currently.  I will further evaluate him here with basic labs to eval for anemia or electrolyte abnormalities.  Will obtain a CT scan of his head to evaluate for intracranial hemorrhage or mass lesion given his age.  Will also obtain a urinalysis and chest x-ray for further evaluation for infectious etiologies for his altered mental status.  He does seem to be relatively close to his baseline.  Will give him lidocaine  patch here to see if this helps with his low back pain.  If renal  function is unremarkable we will give him a single dose of Toradol  here as well.  It does appear that on his CT imaging last month that there was some concern for increasing size of his abdominal aortic aneurysm although the endograft appeared to be intact.  Will repeat imaging here to evaluate for stability given his complaint of the flank and low back pain.  The patient's workup is reassuring.  The patient is back to his baseline per his family.  Discussed with him that this may represent some early dementia or could have been some delirium due to pain.  There are no infectious sources noted here.  We discussed admission in the emergency department for physical therapy evaluation versus going home since the patient does have home physical therapy.  He is ultimately discharged through shared decision making with the patient and his family.  Amount and/or Complexity of Data Reviewed Labs: ordered. Radiology: ordered.  Risk Prescription drug management.           Final Clinical Impression(s) / ED  Diagnoses Final diagnoses:  Low back pain, unspecified back pain laterality, unspecified chronicity, unspecified whether sciatica present    Rx / DC Orders ED Discharge Orders          Ordered    diclofenac Sodium (VOLTAREN) 1 % GEL  4 times daily        11/29/23 2030    lidocaine  (LIDODERM ) 5 %  Every 24 hours        11/29/23 2030              Carin Charleston, MD 11/29/23 2034

## 2023-11-29 NOTE — ED Notes (Signed)
 Attempted to get IV access twice. Unsuccessful. IV team consult placed

## 2023-11-29 NOTE — ED Triage Notes (Signed)
 Patient bib EMS from home after family stated his mental status is altered. He has chronic back pain but today states that it is worse than normal. Also EMS states that patient is normally hypotensive but today has been hypertensive. Family gave him metoprolol  12.5mg .

## 2023-12-01 LAB — URINE CULTURE: Culture: 30000 — AB

## 2023-12-02 ENCOUNTER — Telehealth (HOSPITAL_BASED_OUTPATIENT_CLINIC_OR_DEPARTMENT_OTHER): Payer: Self-pay | Admitting: *Deleted

## 2023-12-02 NOTE — Telephone Encounter (Signed)
 Post ED Visit - Positive Culture Follow-up  Culture report reviewed by antimicrobial stewardship pharmacist: [facility] []  [place], Pharm.D. []  Skeet Duke, Pharm.D., BCPS AQ-ID []  Leslee Rase, Pharm.D., BCPS []  Garland Junk, [address].D., BCPS []  [place], [address].D., BCPS, AAHIVP []  Alcide Aly, Pharm.D., BCPS, AAHIVP []  Jerri Morale, PharmD, BCPS []  Graham Laws, PharmD, BCPS []  Cleda Curly, PharmD, BCPS []  Tamar Fairly, PharmD []  Ballard Levels, PharmD, BCPS [x]  Sheryle Donning, PharmD  [facility] []  Arlyne Bering, PharmD []  Sherryle Don, PharmD []  [place], PharmD []  Delila Felty, Rph []  [place]) Cleora Daft, PharmD []  Augustina Block, PharmD []  Arie Kurtz, PharmD []  Sharlyn Deaner, PharmD []  Agnes Hose, PharmD []  Kendall Pauls, PharmD []  Gladstone Lamer, PharmD []  Armanda Bern, PharmD []  Tera Fellows, PharmD   Positive urine culture No symptoms  and no further patient follow-up is required at this time.  Tyler [name] 12/02/2023, 9:42 AM

## 2024-08-17 ENCOUNTER — Other Ambulatory Visit (HOSPITAL_BASED_OUTPATIENT_CLINIC_OR_DEPARTMENT_OTHER): Payer: Self-pay

## 2024-08-17 ENCOUNTER — Emergency Department (HOSPITAL_BASED_OUTPATIENT_CLINIC_OR_DEPARTMENT_OTHER)
Admission: EM | Admit: 2024-08-17 | Discharge: 2024-08-17 | Disposition: A | Discharge status: Home / Self Care | Attending: Emergency Medicine | Admitting: Emergency Medicine

## 2024-08-17 ENCOUNTER — Encounter (HOSPITAL_BASED_OUTPATIENT_CLINIC_OR_DEPARTMENT_OTHER): Payer: Self-pay

## 2024-08-17 DIAGNOSIS — Z79899 Other long term (current) drug therapy: Secondary | ICD-10-CM | POA: Insufficient documentation

## 2024-08-17 DIAGNOSIS — N390 Urinary tract infection, site not specified: Secondary | ICD-10-CM | POA: Insufficient documentation

## 2024-08-17 DIAGNOSIS — Z7982 Long term (current) use of aspirin: Secondary | ICD-10-CM | POA: Insufficient documentation

## 2024-08-17 DIAGNOSIS — Z8673 Personal history of transient ischemic attack (TIA), and cerebral infarction without residual deficits: Secondary | ICD-10-CM | POA: Insufficient documentation

## 2024-08-17 DIAGNOSIS — Z7902 Long term (current) use of antithrombotics/antiplatelets: Secondary | ICD-10-CM | POA: Insufficient documentation

## 2024-08-17 MED ORDER — CEPHALEXIN 500 MG PO CAPS
500.0000 mg | ORAL_CAPSULE | Freq: Two times a day (BID) | ORAL | 0 refills | Status: DC
  Filled 2024-08-17: qty 14, 7d supply, fill #0

## 2024-08-17 MED ORDER — CEPHALEXIN 250 MG PO CAPS
500.0000 mg | ORAL_CAPSULE | Freq: Once | ORAL | Status: AC
  Administered 2024-08-17: 500 mg via ORAL
  Filled 2024-08-17: qty 2

## 2024-08-17 NOTE — Discharge Instructions (Signed)
 Return if patient develops any altered mental status, fever greater than 100.4 or any other concerning symptoms.  Take antibiotic as prescribed

## 2024-08-17 NOTE — ED Triage Notes (Signed)
 Positive UTI with + culture of E.coli. Letter from [place] reports would benefit from IV antibiotics. Susceptible to most antibiotics. Daughter has the letter.

## 2024-08-17 NOTE — ED Provider Notes (Signed)
 " [place] EMERGENCY DEPARTMENT AT [facility] Provider Note   CSN: [id number] Arrival date & time: 08/17/24  1000     Patient presents with: Dysuria   Tyler [name] is a 89 y.o. male.   Patient here for help with antibiotics for E. coli UTI that they were diagnosed with.  Patient went to [place] on Monday had a urine sample collected but antibiotics were not started.  Culture came back yesterday positive for E. coli.  Daughter's been having a hard time finding someone to help provide antibiotics for this.  He is not having any fever or chills.  He has been acting at his baseline.  He used to have frequent UTIs due to some sort of cyst that he had in the past.  He has been fairly stable from a urinary tract infection standpoint for about a year now.  He has no complaints.  The history is provided by the patient and a caregiver.       Prior to Admission medications  Medication Sig Start Date End Date Taking? Authorizing Provider  cephALEXin  (KEFLEX ) 500 MG capsule Take 1 capsule (500 mg total) by mouth 2 (two) times daily for 7 days. 08/17/24 08/24/24 Yes Wilfred Siverson, DO  alfuzosin  (UROXATRAL ) 10 MG 24 hr tablet Take 1 tablet by mouth at bedtime. 08/18/23   [provider]  aspirin  EC 81 MG tablet Take 1 tablet (81 mg total) by mouth daily. Swallow whole. Hold if  overt external bleeding 02/27/22   Jerri Keys, MD  atorvastatin  (LIPITOR) 40 MG tablet Take 40 mg by mouth at bedtime.    [provider]  clopidogrel  (PLAVIX ) 75 MG tablet Take 1 tablet (75 mg total) by mouth daily. Hold plavix , resume when ok with urology Patient taking differently: Take 75 mg by mouth in the morning. 02/27/22   Jerri Keys, MD  cyanocobalamin  1000 MCG tablet Take 1 tablet (1,000 mcg total) by mouth daily. 10/24/23   Briana Elgin LABOR, MD  diclofenac  Sodium (VOLTAREN ) 1 % GEL Apply 4 g topically 4 (four) times daily. 11/29/23   Ula Prentice SAUNDERS, MD  ferrous gluconate  (FERGON) 324 MG tablet Take 324 mg  by mouth 2 (two) times daily with a meal.    [provider]  furosemide  (LASIX ) 20 MG tablet Take 1 tablet (20 mg total) by mouth daily as needed (swelling). Patient taking differently: Take 20 mg by mouth daily as needed (for swelling). 03/02/22   Claudene Victory ORN, MD  lidocaine  (LIDODERM ) 5 % Place 1 patch onto the skin daily. Remove & Discard patch within 12 hours or as directed by MD 11/29/23   Ula Prentice SAUNDERS, MD  metoprolol  tartrate (LOPRESSOR ) 25 MG tablet Take 0.5 tablets (12.5 mg total) by mouth 2 (two) times daily. Patient taking differently: Take 25 mg by mouth daily as needed (for sBP greater than 169). 08/29/13   Claudene Victory ORN, MD  nitroGLYCERIN  (NITROSTAT ) 0.4 MG SL tablet Place 1 tablet (0.4 mg total) under the tongue every 5 (five) minutes as needed for chest pain. 10/29/21   Claudene Victory ORN, MD  pantoprazole  (PROTONIX ) 40 MG tablet Take 1 tablet (40 mg total) by mouth daily. 04/05/22 10/18/24  Christobal Guadalajara, MD  triamcinolone  cream (KENALOG ) 0.1 % Apply 1 Application topically 2 (two) times daily. Patient taking differently: Apply 1 Application topically daily as needed (for itching). 12/18/22   Robinson, John K, PA-C    Allergies: Bactrim  [sulfamethoxazole -trimethoprim ]    Review of Systems  Updated  Vital Signs BP 117/63 (BP Location: Right Arm)   Pulse 92   Temp 98.3 F (36.8 C)   Resp 15   SpO2 97%   Physical Exam Vitals and nursing note reviewed.  Constitutional:      General: He is not in acute distress.    Appearance: He is well-developed.  HENT:     Head: Normocephalic and atraumatic.  Eyes:     Conjunctiva/sclera: Conjunctivae normal.  Cardiovascular:     Rate and Rhythm: Normal rate and regular rhythm.     Heart sounds: No murmur heard. Pulmonary:     Effort: Pulmonary effort is normal. No respiratory distress.     Breath sounds: Normal breath sounds.  Abdominal:     Palpations: Abdomen is soft.     Tenderness: There is no abdominal tenderness.   Musculoskeletal:        General: No swelling.     Cervical back: Neck supple.  Skin:    General: Skin is warm and dry.     Capillary Refill: Capillary refill takes less than 2 seconds.  Neurological:     Mental Status: He is alert.  Psychiatric:        Mood and Affect: Mood normal.     (all labs ordered are listed, but only abnormal results are displayed) Labs Reviewed - No data to display  EKG: None  Radiology: No results found.   Procedures   Medications Ordered in the ED  cephALEXin  (KEFLEX ) capsule 500 mg (500 mg Oral Given 08/17/24 1039)                                    Medical Decision Making Risk Prescription drug management.   Tyler [name] is here for guidance for treatment for UTI.  Patient is asymptomatic well-appearing.  History of UTIs history of stroke.  Patient had urinalysis collected earlier this week but did not have antibiotics started.  Urine culture that the daughter has in the room is positive for E. coli which is sensitive to all the antibiotics especially cephalosporins.  Overall he has allergy to Bactrim  but no other drug allergies.  She has been trying to reach a provider to help provide a prescription for antibiotics.  She is not sure if he needs IV antibiotics.  But ultimately given that urine is sensitive to cephalosporins we will give Keflex .  He is well-appearing.  He has no fever.  Have no concern for sepsis.  Will put him on Keflex  and have him return if he develops fever or any worsening symptoms.  Discharged in good condition.  Follow-up with PCP.  Normal vitals.  No fever.  This chart was dictated using voice recognition software.  Despite best efforts to proofread,  errors can occur which can change the documentation meaning.      Final diagnoses:  Urinary tract infection without hematuria, site unspecified    ED Discharge Orders          Ordered    cephALEXin  (KEFLEX ) 500 MG capsule  2 times daily        08/17/24 1038                [place], DO 08/17/24 1051  "

## 2024-08-17 NOTE — ED Notes (Signed)
 ED Provider at bedside.

## 2024-08-23 ENCOUNTER — Other Ambulatory Visit: Payer: Self-pay

## 2024-08-23 ENCOUNTER — Inpatient Hospital Stay (HOSPITAL_COMMUNITY)
Admission: EM | Admit: 2024-08-23 | Discharge: 2024-08-30 | Disposition: A | Discharge status: Skilled Nursing Facility | Source: Home / Self Care | Attending: Family Medicine | Admitting: Family Medicine

## 2024-08-23 DIAGNOSIS — N189 Chronic kidney disease, unspecified: Secondary | ICD-10-CM | POA: Diagnosis present

## 2024-08-23 DIAGNOSIS — N138 Other obstructive and reflux uropathy: Secondary | ICD-10-CM | POA: Diagnosis present

## 2024-08-23 DIAGNOSIS — N179 Acute kidney failure, unspecified: Secondary | ICD-10-CM | POA: Diagnosis present

## 2024-08-23 DIAGNOSIS — D72829 Elevated white blood cell count, unspecified: Secondary | ICD-10-CM | POA: Diagnosis present

## 2024-08-23 DIAGNOSIS — R197 Diarrhea, unspecified: Principal | ICD-10-CM | POA: Clinically undetermined

## 2024-08-23 DIAGNOSIS — I10 Essential (primary) hypertension: Secondary | ICD-10-CM | POA: Diagnosis present

## 2024-08-23 DIAGNOSIS — N390 Urinary tract infection, site not specified: Secondary | ICD-10-CM | POA: Diagnosis present

## 2024-08-23 DIAGNOSIS — I2510 Atherosclerotic heart disease of native coronary artery without angina pectoris: Secondary | ICD-10-CM

## 2024-08-23 DIAGNOSIS — R531 Weakness: Secondary | ICD-10-CM

## 2024-08-23 DIAGNOSIS — N1330 Unspecified hydronephrosis: Secondary | ICD-10-CM | POA: Diagnosis present

## 2024-08-23 DIAGNOSIS — D638 Anemia in other chronic diseases classified elsewhere: Secondary | ICD-10-CM | POA: Diagnosis present

## 2024-08-23 LAB — CBC
HCT: 36.1 % — ABNORMAL LOW (ref 39.0–52.0)
Hemoglobin: 11.9 g/dL — ABNORMAL LOW (ref 13.0–17.0)
MCH: 31.1 pg (ref 26.0–34.0)
MCHC: 33 g/dL (ref 30.0–36.0)
MCV: 94.3 fL (ref 80.0–100.0)
Platelets: 228 10*3/uL (ref 150–400)
RBC: 3.83 MIL/uL — ABNORMAL LOW (ref 4.22–5.81)
RDW: 14.7 % (ref 11.5–15.5)
WBC: 18.4 10*3/uL — ABNORMAL HIGH (ref 4.0–10.5)
nRBC: 0 % (ref 0.0–0.2)

## 2024-08-23 LAB — BASIC METABOLIC PANEL WITH GFR
Anion gap: 16 — ABNORMAL HIGH (ref 5–15)
BUN: 30 mg/dL — ABNORMAL HIGH (ref 8–23)
CO2: 21 mmol/L — ABNORMAL LOW (ref 22–32)
Calcium: 8.5 mg/dL — ABNORMAL LOW (ref 8.9–10.3)
Chloride: 103 mmol/L (ref 98–111)
Creatinine, Ser: 1.83 mg/dL — ABNORMAL HIGH (ref 0.61–1.24)
GFR, Estimated: 34 mL/min — ABNORMAL LOW
Glucose, Bld: 100 mg/dL — ABNORMAL HIGH (ref 70–99)
Potassium: 3.6 mmol/L (ref 3.5–5.1)
Sodium: 139 mmol/L (ref 135–145)

## 2024-08-23 LAB — RESP PANEL BY RT-PCR (RSV, FLU A&B, COVID)  RVPGX2
Influenza A by PCR: NEGATIVE
Influenza B by PCR: NEGATIVE
Resp Syncytial Virus by PCR: NEGATIVE
SARS Coronavirus 2 by RT PCR: NEGATIVE

## 2024-08-23 LAB — TROPONIN T, HIGH SENSITIVITY: Troponin T High Sensitivity: 67 ng/L — ABNORMAL HIGH (ref 0–19)

## 2024-08-23 MED ORDER — SODIUM CHLORIDE 0.9 % IV BOLUS
500.0000 mL | Freq: Once | INTRAVENOUS | Status: AC
  Administered 2024-08-24: 500 mL via INTRAVENOUS

## 2024-08-23 NOTE — ED Triage Notes (Signed)
 Pt bib [facility] coming from home with after patient's daughter called for concerns of worsening UTI symptoms. EMS reports patient has had ongoing UTI x6 weeks and has started taking new abx yesterday. Since taking new abx, EMS states patient's daughter reporting pt increased weakness, abdominal/flank pain. Patient denies abdominal/flank pain but does report dysuria. Pt alert and oriented x2 during triage.   EMS VS: 128/80 88 HR 98% RA 159 cbg

## 2024-08-23 NOTE — ED Provider Notes (Signed)
 " [place] EMERGENCY DEPARTMENT AT [facility] Provider Note   CSN: [id number] Arrival date & time: 08/23/24  1500     Patient presents with: Dysuria   Tyler [name] is a 89 y.o. male.   Patient to ED with daughter who provides history as caregiver. She reports he has been treated with antibiotics for the past one month ([place], [place]) for UTI's. He continues to have symptoms of severe dysuria despite treatment, ED visits, urology follow up. Over the last 4 days, pain has increased and he has had onset copious diarrhea. No fever, vomiting. Daughter reports he continues to grow weaker and is now mildly confused. No hematuria.    Dysuria Presenting symptoms: dysuria        Prior to Admission medications  Medication Sig Start Date End Date Taking? Authorizing Provider  alfuzosin  (UROXATRAL ) 10 MG 24 hr tablet Take 1 tablet by mouth at bedtime. 08/18/23   [provider]  aspirin  EC 81 MG tablet Take 1 tablet (81 mg total) by mouth daily. Swallow whole. Hold if  overt external bleeding 02/27/22   Jerri Keys, MD  atorvastatin  (LIPITOR) 40 MG tablet Take 40 mg by mouth at bedtime.    [provider]  cephALEXin  (KEFLEX ) 500 MG capsule Take 1 capsule (500 mg total) by mouth 2 (two) times daily for 7 days. 08/17/24 08/24/24  Curatolo, Adam, DO  clopidogrel  (PLAVIX ) 75 MG tablet Take 1 tablet (75 mg total) by mouth daily. Hold plavix , resume when ok with urology Patient taking differently: Take 75 mg by mouth in the morning. 02/27/22   Jerri Keys, MD  cyanocobalamin  1000 MCG tablet Take 1 tablet (1,000 mcg total) by mouth daily. 10/24/23   Briana Elgin LABOR, MD  diclofenac  Sodium (VOLTAREN ) 1 % GEL Apply 4 g topically 4 (four) times daily. 11/29/23   Ula Prentice SAUNDERS, MD  ferrous gluconate  (FERGON) 324 MG tablet Take 324 mg by mouth 2 (two) times daily with a meal.    [provider]  furosemide  (LASIX ) 20 MG tablet Take 1 tablet (20 mg total) by mouth daily as needed  (swelling). Patient taking differently: Take 20 mg by mouth daily as needed (for swelling). 03/02/22   Claudene Victory ORN, MD  lidocaine  (LIDODERM ) 5 % Place 1 patch onto the skin daily. Remove & Discard patch within 12 hours or as directed by MD 11/29/23   Ula Prentice SAUNDERS, MD  metoprolol  tartrate (LOPRESSOR ) 25 MG tablet Take 0.5 tablets (12.5 mg total) by mouth 2 (two) times daily. Patient taking differently: Take 25 mg by mouth daily as needed (for sBP greater than 169). 08/29/13   Claudene Victory ORN, MD  nitroGLYCERIN  (NITROSTAT ) 0.4 MG SL tablet Place 1 tablet (0.4 mg total) under the tongue every 5 (five) minutes as needed for chest pain. 10/29/21   Claudene Victory ORN, MD  pantoprazole  (PROTONIX ) 40 MG tablet Take 1 tablet (40 mg total) by mouth daily. 04/05/22 10/18/24  Christobal Guadalajara, MD  triamcinolone  cream (KENALOG ) 0.1 % Apply 1 Application topically 2 (two) times daily. Patient taking differently: Apply 1 Application topically daily as needed (for itching). 12/18/22   Lang Norleen POUR, PA-C    Allergies: Bactrim  [sulfamethoxazole -trimethoprim ]    Review of Systems  Genitourinary:  Positive for dysuria.    Updated Vital Signs BP 102/60   Pulse 68   Temp 98 F (36.7 C) (Oral)   Resp 16   SpO2 95%   Physical Exam Vitals and nursing note reviewed.  Constitutional:      Appearance: He is well-developed.  HENT:     Head: Normocephalic.  Cardiovascular:     Rate and Rhythm: Normal rate and regular rhythm.     Heart sounds: No murmur heard. Pulmonary:     Effort: Pulmonary effort is normal.     Breath sounds: Normal breath sounds. No wheezing, rhonchi or rales.  Abdominal:     Palpations: Abdomen is soft.     Tenderness: There is abdominal tenderness. There is no guarding or rebound.     Comments: Generalized abdominal tenderness to soft, nondistended abdomen, greater across lower quadrants.   Musculoskeletal:        General: Normal range of motion.     Cervical back: Normal range of motion and  neck supple.  Skin:    General: Skin is warm and dry.  Neurological:     Mental Status: He is alert and oriented to person, place, and time.     Comments: Required 2-person assist to stand and transfer from wheelchair to bed.     (all labs ordered are listed, but only abnormal results are displayed) Labs Reviewed  BASIC METABOLIC PANEL WITH GFR - Abnormal; Notable for the following components:      Result Value   CO2 21 (*)    Glucose, Bld 100 (*)    BUN 30 (*)    Creatinine, Ser 1.83 (*)    Calcium  8.5 (*)    GFR, Estimated 34 (*)    Anion gap 16 (*)    All other components within normal limits  CBC - Abnormal; Notable for the following components:   WBC 18.4 (*)    RBC 3.83 (*)    Hemoglobin 11.9 (*)    HCT 36.1 (*)    All other components within normal limits  URINALYSIS, W/ REFLEX TO CULTURE (INFECTION SUSPECTED) - Abnormal; Notable for the following components:   APPearance CLOUDY (*)    Hgb urine dipstick MODERATE (*)    Protein, ur 100 (*)    Leukocytes,Ua LARGE (*)    Bacteria, UA RARE (*)    All other components within normal limits  HEPATIC FUNCTION PANEL - Abnormal; Notable for the following components:   Albumin  3.3 (*)    All other components within normal limits  TROPONIN T, HIGH SENSITIVITY - Abnormal; Notable for the following components:   Troponin T High Sensitivity 67 (*)    All other components within normal limits  RESP PANEL BY RT-PCR (RSV, FLU A&B, COVID)  RVPGX2  C DIFFICILE QUICK SCREEN W PCR REFLEX    CULTURE, BLOOD (ROUTINE X 2)  CULTURE, BLOOD (ROUTINE X 2)  URINE CULTURE  GASTROINTESTINAL PANEL BY PCR, STOOL (REPLACES STOOL CULTURE)    EKG: EKG Interpretation Date/Time:  Friday August 23 2024 16:18:09 EST Ventricular Rate:  75 PR Interval:  166 QRS Duration:  92 QT Interval:  430 QTC Calculation: 480 R Axis:   73  Text Interpretation: Sinus rhythm with Premature atrial complexes When compared with ECG of 31-Oct-2023 17:15,  PREVIOUS ECG IS PRESENT Confirmed by Cottie Cough [phone]) on 08/23/2024 10:25:58 PM  Radiology: CT ABDOMEN PELVIS WO CONTRAST Result Date: 08/24/2024 EXAM: CT ABDOMEN AND PELVIS WITHOUT CONTRAST 08/24/2024 02:18:00 AM TECHNIQUE: CT of the abdomen and pelvis was performed without the administration of intravenous contrast. Multiplanar reformatted images are provided for review. Automated exposure control, iterative reconstruction, and/or weight-based adjustment of the mA/kV was utilized to reduce the radiation dose to as low as  reasonably achievable. COMPARISON: CTA abdomen and pelvis 11/29/2023, and CTA chest abdomen and pelvis 10/31/2023. CLINICAL HISTORY: Abdominal pain, acute, nonlocalized. Acute, nonlocalized abdominal pain. FINDINGS: LOWER CHEST: The cardiac size is normal. There are calcifications and thickening of the aortic valve leaflets, calcification of the inferior mitral ring, and 3-vessel coronary calcifications. No pericardial effusion. There is a new small layering left and minimal right layering pleural effusions. There is chronic right lower lobe infrahilar ground-glass opacity and diffuse bronchial thickening in the visualized lungs. There is subtle peribronchovascular ground-glass opacity in the right middle lobe and base of the right upper lobe which is probably due to pneumonitis. There is background subpleural reticulation in the lung bases, with chronic change. Median sternotomy sutures and CABG changes. LIVER: The liver is unremarkable. GALLBLADDER AND BILE DUCTS: Gallbladder is unremarkable. No biliary ductal dilatation. SPLEEN: No acute abnormality. PANCREAS: No acute abnormality. ADRENAL GLANDS: No adrenal mass. KIDNEYS, URETERS AND BLADDER: No contour deforming abnormality of the kidneys is seen. There is a 2 mm calyceal stone in the inferior pole of the right kidney. No other visible nephrolithiasis. Bilateral perinephric stranding is similar to the prior studies. There is  interval new mild-to-moderate bilateral hydroureteronephrosis extending to the bladder, without ureteral or bladder stones being seen. The bladder wall is trabeculated and mildly thickened. Perivesical stranding is seen over portions of the bladder and may suggest cystitis. There is suspected to be an element of chronic bladder outlet obstruction due to an enlarged prostate which measures 5.3 cm and presses into the bladder base, not optimally seen due to metallic artifact from a left hip replacement. GI AND BOWEL: Stomach demonstrates no acute abnormality. There is no bowel obstruction or inflammation. The appendix is normal. There is scattered colonic diverticulosis without evidence for diverticulitis. PERITONEUM AND RETROPERITONEUM: Trace presacral ascites. No free hemorrhage or free air. VASCULATURE: Moderate to heavy aortoiliac calcification atherosclerosis with aortobiiliac stent graft in place. The excluded aneurysm sac is 6.6 cm today, previously 6.3 cm. Stable dilatation in the common iliac arteries, right greater than left. Assessment for an endoleak is not possible without contrast. No periaortic inflammatory changes are seen. LYMPH NODES: No lymphadenopathy. REPRODUCTIVE ORGANS: Enlarged prostate as described above. BONES AND SOFT TISSUES: Osteopenia. Advanced degenerative change in the thoracolumbar spine. Ankylosis of the right SI joint. Moderately advanced right hip arthrosis with left hip arthroplasty. Multiple pelvic phleboliths. IMPRESSION: 1. New mild-to-moderate bilateral hydroureteronephrosis to the bladder without ureteral or bladder stones, likely related to bladder outlet obstruction from prostatomegaly, with bladder wall thickening and perivesical stranding suggesting cystitis. 2. Aortoiliac atherosclerosis with aortobiiliac stent graft in place and stable dilatation of the common iliac arteries, right greater than left, with slight increase in excluded aneurysm sac size to 6.6 cm from 6.3  cm. 3. Subtle peribronchovascular ground glass densities in the right upper and middle lobes. Probable pneumonitis. 4. Small left and minimal right pleural effusions. Electronically signed by: Francis Quam MD 08/24/2024 04:55 AM EST RP Workstation: [id number]     Procedures   Medications Ordered in the ED  sodium chloride  0.9 % bolus 500 mL (0 mLs Intravenous Stopped 08/24/24 0200)  sodium chloride  0.9 % bolus 500 mL (0 mLs Intravenous Stopped 08/24/24 0515)    Clinical Course as of 08/24/24 0530  Fri Aug 23, 2024  2228 Patient BIB daughter who is main caregiver at home and who provides history as detailed in HPI, including one month of antibiotic treatment for painful urination, now with diarrhea for 4 days, progressive  weakness/unable to ambulate or stand independently, mild confusion.  [SU]  2257 Labs concerning for leukocytosis of 18.4, Cr 1.83 with baseline that appears to be 1.40. Cultures ordered. Suspect C. Diff. IV fluids ordered. Anticipate admission [SU]  Sat Aug 24, 2024  0039 UA with rare bacteria, >50 WBC, cloudy appearance. No stool after arrival to room to collect for c diff testing. Patient comfortable.  [SU]  0201 On further examination, patient's abdomen remains tender, greater in lower quadrants, after bladder is emptied by catheterization. Will obtain CT scan w/o CM given elevated Cr.  [SU]  9491 CT per radiology interpretation:  IMPRESSION: 1. New mild-to-moderate bilateral hydroureteronephrosis to the bladder without ureteral or bladder stones, likely related to bladder outlet obstruction from prostatomegaly, with bladder wall thickening and perivesical stranding suggesting cystitis. 2. Aortoiliac atherosclerosis with aortobiiliac stent graft in place and stable dilatation of the common iliac arteries, right greater than left, with slight increase in excluded aneurysm sac size to 6.6 cm from 6.3 cm. 3. Subtle peribronchovascular ground glass densities in the right  upper and middle lobes. Probable pneumonitis. 4. Small left and minimal right pleural effusions.  [SU]  [id number] Discussed admission with hospitalist who accepts the patient to their service.  [SU]    Clinical Course User Index [SU] Odell Balls, PA-C                                 Medical Decision Making Amount and/or Complexity of Data Reviewed Labs: ordered. Radiology: ordered.  Risk Decision regarding hospitalization.        Final diagnoses:  Diarrhea of presumed infectious origin  Weakness generalized    ED Discharge Orders     None          Odell Balls, PA-C 08/24/24 0530    Cottie Donnice PARAS, MD 08/24/24 1242  "

## 2024-08-23 NOTE — ED Provider Triage Note (Signed)
 Emergency Medicine Provider Triage Evaluation Note  Tyler [name] , a 89 y.o. male  was evaluated in triage.  Pt complains of generalized weakness.  Daughter says he's more confused and having pain with urination Hx of UTI's Been on 4 antibiotics the past 2 months - keflex  course x 2 and macrobid x 2  Review of Systems  Positive: Dysuria Negative: Fevers  Physical Exam  BP 118/64 (BP Location: Right Arm)   Pulse 75   Temp 97.7 F (36.5 C) (Oral)   Resp 16   SpO2 99%  Gen:   Awake, no distress   Resp:  Normal effort  MSK:   Moves extremities without difficulty    Medical Decision Making  Medically screening exam initiated at 4:10 PM.  Appropriate orders placed.  Tyler [name] was informed that the remainder of the evaluation will be completed by another provider, this initial triage assessment does not replace that evaluation, and the importance of remaining in the ED until their evaluation is complete.  UA, labs ordered No localizing stroke symptoms   Cottie Donnice PARAS, MD 08/23/24 1610

## 2024-08-23 NOTE — ED Provider Notes (Incomplete)
 " [place] EMERGENCY DEPARTMENT AT [facility] Provider Note   CSN: [id number] Arrival date & time: 08/23/24  1500     Patient presents with: Dysuria   Tyler [name] is a 89 y.o. male.  {Add pertinent medical, surgical, social history, OB history to YEP:[id number]} Patient to ED with daughter who provides history as caregiver. She reports he has been treated with antibiotics for the past one month ([place], [place]) for UTI's. He continues to have symptoms of severe dysuria despite treatment, ED visits, urology follow up. Over the last 4 days, pain has increased and he has had onset copious diarrhea. No fever, vomiting. Daughter reports he continues to grow weaker and is now mildly confused. No hematuria.    Dysuria Presenting symptoms: dysuria        Prior to Admission medications  Medication Sig Start Date End Date Taking? Authorizing Provider  alfuzosin  (UROXATRAL ) 10 MG 24 hr tablet Take 1 tablet by mouth at bedtime. 08/18/23   [provider]  aspirin  EC 81 MG tablet Take 1 tablet (81 mg total) by mouth daily. Swallow whole. Hold if  overt external bleeding 02/27/22   Jerri Keys, MD  atorvastatin  (LIPITOR) 40 MG tablet Take 40 mg by mouth at bedtime.    [provider]  cephALEXin  (KEFLEX ) 500 MG capsule Take 1 capsule (500 mg total) by mouth 2 (two) times daily for 7 days. 08/17/24 08/24/24  Curatolo, Adam, DO  clopidogrel  (PLAVIX ) 75 MG tablet Take 1 tablet (75 mg total) by mouth daily. Hold plavix , resume when ok with urology Patient taking differently: Take 75 mg by mouth in the morning. 02/27/22   Jerri Keys, MD  cyanocobalamin  1000 MCG tablet Take 1 tablet (1,000 mcg total) by mouth daily. 10/24/23   Briana Elgin LABOR, MD  diclofenac  Sodium (VOLTAREN ) 1 % GEL Apply 4 g topically 4 (four) times daily. 11/29/23   Ula Prentice SAUNDERS, MD  ferrous gluconate  (FERGON) 324 MG tablet Take 324 mg by mouth 2 (two) times daily with a meal.    [provider]  furosemide   (LASIX ) 20 MG tablet Take 1 tablet (20 mg total) by mouth daily as needed (swelling). Patient taking differently: Take 20 mg by mouth daily as needed (for swelling). 03/02/22   Claudene Victory ORN, MD  lidocaine  (LIDODERM ) 5 % Place 1 patch onto the skin daily. Remove & Discard patch within 12 hours or as directed by MD 11/29/23   Ula Prentice SAUNDERS, MD  metoprolol  tartrate (LOPRESSOR ) 25 MG tablet Take 0.5 tablets (12.5 mg total) by mouth 2 (two) times daily. Patient taking differently: Take 25 mg by mouth daily as needed (for sBP greater than 169). 08/29/13   Claudene Victory ORN, MD  nitroGLYCERIN  (NITROSTAT ) 0.4 MG SL tablet Place 1 tablet (0.4 mg total) under the tongue every 5 (five) minutes as needed for chest pain. 10/29/21   Claudene Victory ORN, MD  pantoprazole  (PROTONIX ) 40 MG tablet Take 1 tablet (40 mg total) by mouth daily. 04/05/22 10/18/24  Christobal Guadalajara, MD  triamcinolone  cream (KENALOG ) 0.1 % Apply 1 Application topically 2 (two) times daily. Patient taking differently: Apply 1 Application topically daily as needed (for itching). 12/18/22   Robinson, John K, PA-C    Allergies: Bactrim  [sulfamethoxazole -trimethoprim ]    Review of Systems  Genitourinary:  Positive for dysuria.    Updated Vital Signs BP 132/65   Pulse 76   Temp 98 F (36.7 C) (Oral)   Resp 15   SpO2 96%  Physical Exam Vitals and nursing note reviewed.  Constitutional:      Appearance: He is well-developed.  HENT:     Head: Normocephalic.  Cardiovascular:     Rate and Rhythm: Normal rate and regular rhythm.     Heart sounds: No murmur heard. Pulmonary:     Effort: Pulmonary effort is normal.     Breath sounds: Normal breath sounds. No wheezing, rhonchi or rales.  Abdominal:     General: Bowel sounds are normal.     Palpations: Abdomen is soft.     Tenderness: There is no abdominal tenderness. There is no guarding or rebound.  Musculoskeletal:        General: Normal range of motion.     Cervical back: Normal range of motion and  neck supple.  Skin:    General: Skin is warm and dry.  Neurological:     General: No focal deficit present.     Mental Status: He is alert and oriented to person, place, and time.     (all labs ordered are listed, but only abnormal results are displayed) Labs Reviewed  BASIC METABOLIC PANEL WITH GFR - Abnormal; Notable for the following components:      Result Value   CO2 21 (*)    Glucose, Bld 100 (*)    BUN 30 (*)    Creatinine, Ser 1.83 (*)    Calcium  8.5 (*)    GFR, Estimated 34 (*)    Anion gap 16 (*)    All other components within normal limits  CBC - Abnormal; Notable for the following components:   WBC 18.4 (*)    RBC 3.83 (*)    Hemoglobin 11.9 (*)    HCT 36.1 (*)    All other components within normal limits  TROPONIN T, HIGH SENSITIVITY - Abnormal; Notable for the following components:   Troponin T High Sensitivity 67 (*)    All other components within normal limits  RESP PANEL BY RT-PCR (RSV, FLU A&B, COVID)  RVPGX2  C DIFFICILE QUICK SCREEN W PCR REFLEX    URINALYSIS, W/ REFLEX TO CULTURE (INFECTION SUSPECTED)    EKG: EKG Interpretation Date/Time:  Friday August 23 2024 16:18:09 EST Ventricular Rate:  75 PR Interval:  166 QRS Duration:  92 QT Interval:  430 QTC Calculation: 480 R Axis:   73  Text Interpretation: Sinus rhythm with Premature atrial complexes When compared with ECG of 31-Oct-2023 17:15, PREVIOUS ECG IS PRESENT Confirmed by Cottie Cough [phone]) on 08/23/2024 10:25:58 PM  Radiology: No results found.  {Document cardiac monitor, telemetry assessment procedure when appropriate:32947} Procedures   Medications Ordered in the ED - No data to display    {Click here for ABCD2, HEART and other calculators REFRESH Note before signing:1}                              Medical Decision Making Amount and/or Complexity of Data Reviewed Labs: ordered.   ***  {Document critical care time when appropriate  Document review of labs and clinical  decision tools ie [id number], etc  Document your independent review of radiology images and any outside records  Document your discussion with family members, caretakers and with consultants  Document social determinants of health affecting pt's care  Document your decision making why or why not admission, treatments were needed:32947:::1}   Final diagnoses:  None    ED Discharge Orders     None        "

## 2024-08-24 ENCOUNTER — Encounter (HOSPITAL_COMMUNITY): Payer: Self-pay | Admitting: Internal Medicine

## 2024-08-24 ENCOUNTER — Emergency Department (HOSPITAL_COMMUNITY)

## 2024-08-24 DIAGNOSIS — D638 Anemia in other chronic diseases classified elsewhere: Secondary | ICD-10-CM | POA: Diagnosis present

## 2024-08-24 DIAGNOSIS — N138 Other obstructive and reflux uropathy: Secondary | ICD-10-CM | POA: Diagnosis present

## 2024-08-24 DIAGNOSIS — N1330 Unspecified hydronephrosis: Secondary | ICD-10-CM | POA: Diagnosis present

## 2024-08-24 DIAGNOSIS — N179 Acute kidney failure, unspecified: Secondary | ICD-10-CM | POA: Diagnosis present

## 2024-08-24 DIAGNOSIS — D72829 Elevated white blood cell count, unspecified: Secondary | ICD-10-CM | POA: Diagnosis present

## 2024-08-24 DIAGNOSIS — N390 Urinary tract infection, site not specified: Secondary | ICD-10-CM | POA: Diagnosis present

## 2024-08-24 DIAGNOSIS — R197 Diarrhea, unspecified: Secondary | ICD-10-CM | POA: Clinically undetermined

## 2024-08-24 DIAGNOSIS — R531 Weakness: Secondary | ICD-10-CM

## 2024-08-24 DIAGNOSIS — N189 Chronic kidney disease, unspecified: Secondary | ICD-10-CM | POA: Diagnosis present

## 2024-08-24 LAB — HEPATIC FUNCTION PANEL
ALT: 30 U/L (ref 0–44)
AST: 32 U/L (ref 15–41)
Albumin: 3.3 g/dL — ABNORMAL LOW (ref 3.5–5.0)
Alkaline Phosphatase: 86 U/L (ref 38–126)
Bilirubin, Direct: 0.1 mg/dL (ref 0.0–0.2)
Indirect Bilirubin: 0.7 mg/dL (ref 0.3–0.9)
Total Bilirubin: 0.8 mg/dL (ref 0.0–1.2)
Total Protein: 6.7 g/dL (ref 6.5–8.1)

## 2024-08-24 LAB — C DIFFICILE QUICK SCREEN W PCR REFLEX
C Diff antigen: NEGATIVE
C Diff interpretation: NOT DETECTED
C Diff toxin: NEGATIVE

## 2024-08-24 LAB — URINALYSIS, W/ REFLEX TO CULTURE (INFECTION SUSPECTED)
Bilirubin Urine: NEGATIVE
Glucose, UA: NEGATIVE mg/dL
Ketones, ur: NEGATIVE mg/dL
Nitrite: NEGATIVE
Protein, ur: 100 mg/dL — AB
Specific Gravity, Urine: 1.008 (ref 1.005–1.030)
WBC, UA: >50 WBC/hpf (ref 0–5)
pH: 6 (ref 5.0–8.0)

## 2024-08-24 LAB — GLUCOSE, CAPILLARY: Glucose-Capillary: 111 mg/dL — ABNORMAL HIGH (ref 70–99)

## 2024-08-24 MED ORDER — CHLORHEXIDINE GLUCONATE CLOTH 2 % EX PADS
6.0000 | MEDICATED_PAD | Freq: Every day | CUTANEOUS | Status: DC
  Administered 2024-08-24 – 2024-08-30 (×6): 6 via TOPICAL

## 2024-08-24 MED ORDER — SODIUM CHLORIDE 0.9 % IV SOLN
1.0000 g | INTRAVENOUS | Status: DC
  Administered 2024-08-24 – 2024-08-25 (×2): 1 g via INTRAVENOUS
  Filled 2024-08-24 (×3): qty 10

## 2024-08-24 MED ORDER — SODIUM CHLORIDE 0.9 % IV SOLN: INTRAVENOUS | Status: AC

## 2024-08-24 MED ORDER — SODIUM CHLORIDE 0.9 % IV BOLUS
500.0000 mL | Freq: Once | INTRAVENOUS | Status: AC
  Administered 2024-08-24: 500 mL via INTRAVENOUS

## 2024-08-24 MED ORDER — METOPROLOL TARTRATE 25 MG PO TABS: 12.5000 mg | ORAL_TABLET | Freq: Two times a day (BID) | ORAL | Status: DC | PRN

## 2024-08-24 MED ORDER — ONDANSETRON HCL 4 MG/2ML IJ SOLN: 4.0000 mg | Freq: Four times a day (QID) | INTRAMUSCULAR | Status: DC | PRN

## 2024-08-24 MED ORDER — TAMSULOSIN HCL 0.4 MG PO CAPS
0.4000 mg | ORAL_CAPSULE | Freq: Every day | ORAL | Status: DC
  Administered 2024-08-24 – 2024-08-29 (×6): 0.4 mg via ORAL
  Filled 2024-08-24 (×6): qty 1

## 2024-08-24 MED ORDER — ALBUTEROL SULFATE (2.5 MG/3ML) 0.083% IN NEBU: 2.5000 mg | INHALATION_SOLUTION | Freq: Four times a day (QID) | RESPIRATORY_TRACT | Status: DC | PRN

## 2024-08-24 MED ORDER — SACCHAROMYCES BOULARDII 250 MG PO CAPS
250.0000 mg | ORAL_CAPSULE | Freq: Two times a day (BID) | ORAL | Status: DC
  Administered 2024-08-24 – 2024-08-30 (×13): 250 mg via ORAL
  Filled 2024-08-24 (×13): qty 1

## 2024-08-24 MED ORDER — ONDANSETRON HCL 4 MG PO TABS: 4.0000 mg | ORAL_TABLET | Freq: Four times a day (QID) | ORAL | Status: DC | PRN

## 2024-08-24 MED ORDER — HEPARIN SODIUM (PORCINE) 5000 UNIT/ML IJ SOLN
5000.0000 [IU] | Freq: Three times a day (TID) | INTRAMUSCULAR | Status: DC
  Administered 2024-08-24 – 2024-08-30 (×19): 5000 [IU] via SUBCUTANEOUS
  Filled 2024-08-24 (×19): qty 1

## 2024-08-24 MED ORDER — ACETAMINOPHEN 325 MG PO TABS
650.0000 mg | ORAL_TABLET | Freq: Four times a day (QID) | ORAL | Status: DC | PRN
  Administered 2024-08-27 – 2024-08-28 (×3): 650 mg via ORAL
  Filled 2024-08-24 (×3): qty 2

## 2024-08-24 MED ORDER — ACETAMINOPHEN 650 MG RE SUPP: 650.0000 mg | Freq: Four times a day (QID) | RECTAL | Status: DC | PRN

## 2024-08-24 MED ORDER — SODIUM CHLORIDE 0.9% FLUSH
3.0000 mL | Freq: Two times a day (BID) | INTRAVENOUS | Status: DC
  Administered 2024-08-24 – 2024-08-30 (×13): 3 mL via INTRAVENOUS

## 2024-08-24 NOTE — ED Notes (Signed)
 RN attempted IV unsuccessful. IV consult placed

## 2024-08-24 NOTE — ED Notes (Signed)
 Patient transported to CT

## 2024-08-24 NOTE — Hospital Course (Signed)
 Tyler [name] is a 89 y.o. male with a history of bladder cancer s/p resection, recurrent UTI, CAD s/p CABG, HFpEF, CKD stage IIIb, CVA, AAA s/p graft repair, carotid artery disease, anemia, GI bleeding.  Patient presented secondary to dysuria with evidence of UTI and AKI secondary to bladder outlet obstruction with resultant bilateral hydronephrosis. Empiric antibiotics started and foley catheter placed.

## 2024-08-24 NOTE — H&P (Signed)
 " History and Physical    Patient: Tyler [name] FMW:[id number] DOB: 12-05-1928 DOA: 08/23/2024 DOS: the patient was seen and examined on 08/24/2024 PCP: Milissa Savant, MD  Patient coming from: Home  Chief Complaint:  Chief Complaint  Patient presents with   Dysuria   HPI: Tyler [name] is a 89 y.o. male with medical history significant of bladder cancer s/p resection, recurrent UTI, CAD s/p CABG, diastolic heart failure, CKD stage IIIb, CVA, AAA s/p graft, right carotid artery occlusion and severe left ICA stenosis, anemia and history of GI bleeding with AVMs ho presents with urinary symptoms and weakness following multiple courses of antibiotics for a suspected UTI. He is accompanied by his daughter.  He was initially brought to the [facility] on December 17th, 2025 due to concerns of a urinary tract infection (UTI), characterized by smelly, dark urine. He has since been treated with four courses of antibiotics, including cephalexin  and nitrofurantoin, but his condition has not improved. His daughter reports that he has become progressively weaker over this period.  He experiences tremendous pain during urination, which has worsened over the past six weeks. He has been calling out in pain at night and has difficulty urinating. Additionally, he has been experiencing small amounts of diarrhea/loose stool over the last couple of days. His appetite has decreased significantly, which is unusual for him even when he has a UTI.  No fever, nausea, or vomiting was reported.  He has a history of bladder cysts, which were removed about a year and a half ago, and he had a follow-up six months later. He has had imaging studies performed, and his daughter reports that he has had difficulty fully evacuating his bladder, with discomfort and pain in his stomach and penis.  His blood pressure has been elevated during this hospital visit, reaching 180s, which is higher than his usual readings of 114-120. He has not  taken metoprolol  for nearly two years, which was previously used as needed for elevated blood pressure.  He is allergic to Bactrim  and has not taken furosemide  for two years. His daughter is concerned about his declining physical activity and increasing weakness, as he has not been walking or active recently.  In the emergency department patient was noted to be afebrile with blood pressures elevated up to 161/74 and all other vital signs maintained.  Lab work significant for leukocytosis of 18.4, CO2 21, BUN 30, creatinine 1.83, glucose 100, anion gap 16, and high-sensitivity troponin 67.  Respiratory panel negative.  Pending C. difficile and GI panel.  Blood cultures were collected.  Analysis significant for moderate hemoglobin, large leukocytes, rare bacteria, and greater than 50 WBCs. CT scanning of the abdomen and pelvis showing bilateral hydronephrosis secondary to prostatomegaly with bladder wall thickening concerning for cystitis, pneumonitis with minimal bilateral pleural effusion, aortic atherosclerosis with added to biiliac stent graft in place, stable dilatation of the common iliac arteries with right greater than left, and slight increase in excluded aneurysm sac size to 6.6 cm from 6.3 cm.  Urine and blood cultures were obtained.  Patient had been given 500 mL bolus of IV fluids.   Review of Systems: As mentioned in the history of present illness. All other systems reviewed and are negative. Past Medical History:  Diagnosis Date   AAA (abdominal aortic aneurysm)    a. 05/2014 s/p stent grafting [facility]).   Bronchitis    CAD (coronary artery disease)    a. 06/2013 VF Arrest/Cath: 3VD, EF 50%;  b. 06/2013 CABG  x 4: LIMA->LAD, VG->PDA->RPL, VG->OM.   CVA (cerebral infarction)    Erectile dysfunction    Essential hypertension    GERD (gastroesophageal reflux disease)    H/O hiatal hernia    HOH (hard of hearing)    wears hearing aid in right ear   Hyperlipidemia     Osteoarthritis    Pneumonia    YRS AGO   Stroke [facility])    Past Surgical History:  Procedure Laterality Date   COLONOSCOPY WITH PROPOFOL  N/A 02/26/2022   Procedure: COLONOSCOPY WITH PROPOFOL ;  Surgeon: Stacia Glendia BRAVO, MD;  Location: [facility];  Service: Gastroenterology;  Laterality: N/A;   CORONARY ARTERY BYPASS GRAFT N/A 06/27/2013   Procedure: CORONARY ARTERY BYPASS GRAFTING (CABG);  Surgeon: Maude Fleeta Ochoa, MD;  Location: [facility] OR;  Service: Open Heart Surgery;  Laterality: N/A;  times four utilizing the left internal mammary artery and the right greater saphenous vein harvested endoscopically   ENDOVEIN HARVEST OF GREATER SAPHENOUS VEIN Right 06/27/2013   Procedure: ENDOVEIN HARVEST OF GREATER SAPHENOUS VEIN;  Surgeon: Maude Fleeta Ochoa, MD;  Location: [facility] OR;  Service: Open Heart Surgery;  Laterality: Right;   ESOPHAGOGASTRODUODENOSCOPY (EGD) WITH PROPOFOL  N/A 02/25/2022   Procedure: ESOPHAGOGASTRODUODENOSCOPY (EGD) WITH PROPOFOL ;  Surgeon: Rollin Dover, MD;  Location: [facility];  Service: Gastroenterology;  Laterality: N/A;   EYE SURGERY     bilateral cataracts   GIVENS CAPSULE STUDY N/A 02/26/2022   Procedure: GIVENS CAPSULE STUDY;  Surgeon: Stacia Glendia BRAVO, MD;  Location: [facility];  Service: Gastroenterology;  Laterality: N/A;   HEMOSTASIS CLIP PLACEMENT  02/26/2022   Procedure: HEMOSTASIS CLIP PLACEMENT;  Surgeon: Stacia Glendia BRAVO, MD;  Location: [facility];  Service: Gastroenterology;;   KRISTAN EAR SURGERY Left 1982   INNER EAR SURGERY     severed a nerve in his left ear.    LEFT HEART CATHETERIZATION WITH CORONARY ANGIOGRAM N/A 06/27/2013   Procedure: LEFT HEART CATHETERIZATION WITH CORONARY ANGIOGRAM;  Surgeon: Victory LELON Claudene DOUGLAS, MD;  Location: [facility] CATH LAB;  Service: Cardiovascular;  Laterality: N/A;  iabp insertion, cpr, intubation, emergent cvts   POLYPECTOMY  02/26/2022   Procedure: POLYPECTOMY;  Surgeon: Stacia Glendia BRAVO, MD;  Location: [facility];  Service:  Gastroenterology;;   TEE WITHOUT CARDIOVERSION N/A 06/27/2013   Procedure: TRANSESOPHAGEAL ECHOCARDIOGRAM (TEE);  Surgeon: Maude Fleeta Ochoa, MD;  Location: [facility] OR;  Service: Open Heart Surgery;  Laterality: N/A;   TONSILLECTOMY     TOTAL HIP ARTHROPLASTY Left 09/01/2014   Procedure: TOTAL HIP ARTHROPLASTY;  Surgeon: Dempsey JINNY Sensor, MD;  Location: [facility] OR;  Service: Orthopedics;  Laterality: Left;   Social History:  reports that he has never smoked. He has never used smokeless tobacco. He reports that he does not drink alcohol  and does not use drugs.  Allergies[1]  Family History  Problem Relation Age of Onset   Heart disease Sister    Heart disease Brother    Heart attack Sister    Heart attack Brother    Colon cancer Neg Hx    Hypertension Neg Hx    Stroke Neg Hx     Prior to Admission medications  Medication Sig Start Date End Date Taking? Authorizing Provider  alfuzosin  (UROXATRAL ) 10 MG 24 hr tablet Take 1 tablet by mouth at bedtime. 08/18/23  Yes [provider]  aspirin  EC 81 MG tablet Take 1 tablet (81 mg total) by mouth daily. Swallow whole. Hold if  overt external bleeding 02/27/22  Yes Jerri Keys, MD  atorvastatin  (LIPITOR) 40 MG tablet Take 40 mg by mouth at bedtime.   Yes [provider]  clopidogrel  (PLAVIX ) 75 MG tablet Take 1 tablet (75 mg total) by mouth daily. Hold plavix , resume when ok with urology Patient taking differently: Take 75 mg by mouth in the morning. 02/27/22  Yes Jerri Keys, MD  cyanocobalamin  1000 MCG tablet Take 1 tablet (1,000 mcg total) by mouth daily. 10/24/23  Yes Briana Elgin LABOR, MD  ferrous gluconate  (FERGON) 324 MG tablet Take 324 mg by mouth 2 (two) times daily with a meal.   Yes [provider]  pantoprazole  (PROTONIX ) 40 MG tablet Take 1 tablet (40 mg total) by mouth daily. 04/05/22 10/18/24 Yes Christobal Guadalajara, MD  triamcinolone  cream (KENALOG ) 0.1 % Apply 1 Application topically 2 (two) times daily. Patient taking differently: Apply 1  Application topically daily as needed (for itching). 12/18/22  Yes Robinson, John K, PA-C  cephALEXin  (KEFLEX ) 500 MG capsule Take 1 capsule (500 mg total) by mouth 2 (two) times daily for 7 days. Patient not taking: Reported on 08/24/2024 08/17/24 08/24/24  Ruthe Cornet, DO  diclofenac  Sodium (VOLTAREN ) 1 % GEL Apply 4 g topically 4 (four) times daily. Patient not taking: Reported on 08/24/2024 11/29/23   Ula Prentice SAUNDERS, MD  furosemide  (LASIX ) 20 MG tablet Take 1 tablet (20 mg total) by mouth daily as needed (swelling). Patient not taking: Reported on 08/24/2024 03/02/22   Claudene Victory ORN, MD  lidocaine  (LIDODERM ) 5 % Place 1 patch onto the skin daily. Remove & Discard patch within 12 hours or as directed by MD Patient not taking: Reported on 08/24/2024 11/29/23   Ula Prentice SAUNDERS, MD  metoprolol  tartrate (LOPRESSOR ) 25 MG tablet Take 0.5 tablets (12.5 mg total) by mouth 2 (two) times daily. Patient not taking: Reported on 08/24/2024 08/29/13   Claudene Victory ORN, MD  nitroGLYCERIN  (NITROSTAT ) 0.4 MG SL tablet Place 1 tablet (0.4 mg total) under the tongue every 5 (five) minutes as needed for chest pain. Patient not taking: Reported on 08/24/2024 10/29/21   Claudene Victory ORN, MD    Physical Exam: Vitals:   08/24/24 0600 08/24/24 0615 08/24/24 0724 08/24/24 0835  BP: 132/82 139/76  (!) 161/74  Pulse: 73 73  75  Resp: 15 18  17   Temp:    97.8 F (36.6 C)  TempSrc:    Axillary  SpO2: 93% 95% 95% 100%     Constitutional: Elderly male who appears to be in   Eyes: PERRL, lids and conjunctivae normal ENMT: Mucous membranes are moist.  Hard of hearing. Neck: normal, supple  Respiratory: clear to auscultation bilaterally, no wheezing, no crackles. Normal respiratory effort. No accessory muscle use.  Cardiovascular: Regular rate and rhythm, no murmurs / rubs / gallops. No extremity edema.   Abdomen: Lower abdominal tenderness to palpation present.  Bowel sounds positive.  Musculoskeletal: no clubbing / cyanosis. No  joint deformity upper and lower extremities. Good ROM, no contractures. Normal muscle tone.  Skin: no rashes, lesions, ulcers. No induration Neurologic: CN 2-12 grossly intact.  Able to move all extremities. Psychiatric: Normal judgment and insight. Alert and oriented x 3. Normal mood.   Data Reviewed:  EKG revealed normal sinus rhythm with premature atrial complexes at 75 bpm.  Reviewed labs, imaging, and pertinent records as documented.  Assessment and Plan:  Complicated urinary tract infection Patient presents with complaints of dysuria after recently being treated for a urinary tract infection with outpatient antibiotics of nitrofurantoin and cephalexin .  Urinalysis noted moderate hemoglobin, large leukocytes, rare bacteria, greater than 50 WBCs.  CT noted bladder wall thickening concerning for cystitis.  Urine cultures were obtained.  Review of records note previous urine cultures from the [facility] were positive for E. coli that was pansensitive.   - Admit to a telemetry bed - Follow-up urine cultures - Empiric antibiotics of Rocephin .  Adjust antibiotics as deemed medically required  Diarrhea Patient had reportedly been having episodes of diarrhea.  Given multiple rounds of antibiotics question possibility of C. difficile. - Check C. difficile and GI panel.    - Probiotics  Leukocytosis Acute.  WBC elevated 18.4.  Suspect secondary to persistent urinary tract infection.  No other SIRS criteria were noted. - Follow-up urine and blood cultures - Continue antibiotics   Acute urinary retention secondary to BPH with hydronephrosis Acute kidney injury superimposed on chronic kidney disease stage IIIb Patient presents with creatinine elevated up to 1.83 with BUN 38.  Baseline creatinine previously noted to be around 1.4.  CT scan of the abdomen and pelvis showing bilateral hydronephrosis thought secondary to prostatomegaly with bladder wall thickening concerning for cystitis.  Patient had been  given a 500 mL bolus of IV fluids. - Strict I&Os - Foley catheter ordered - Resume Flomax   Hypertension Patient's blood pressure is noted to be elevated up to 161/74.  Patient has not been on blood pressure medicine in quite some time. - Metoprolol  added on as prn for systolic blood pressures greater than 160  Anemia of chronic disease Hemoglobin noted to be 11.9 which appears around patient's baseline. - Continue to monitor  Generalized weakness Daughter notes that the patient had become weaker due to the persistence of symptoms. - PT to evaluate and treat   DVT prophylaxis: Heparin  Advance Care Planning:   Code Status: Full Code    Consults: None  Family Communication: Daughter updated at bedside  Severity of Illness: The appropriate patient status for this patient is INPATIENT. Inpatient status is judged to be reasonable and necessary in order to provide the required intensity of service to ensure the patient's safety. The patient's presenting symptoms, physical exam findings, and initial radiographic and laboratory data in the context of their chronic comorbidities is felt to place them at high risk for further clinical deterioration. Furthermore, it is not anticipated that the patient will be medically stable for discharge from the hospital within 2 midnights of admission.   * I certify that at the point of admission it is my clinical judgment that the patient will require inpatient hospital care spanning beyond 2 midnights from the point of admission due to high intensity of service, high risk for further deterioration and high frequency of surveillance required.*  Author: Maximino DELENA Sharps, MD 08/24/2024 9:10 AM  For on call review [email or web address].      [1]  Allergies Allergen Reactions   Bactrim  [Sulfamethoxazole -Trimethoprim ] Itching and Rash   "

## 2024-08-25 LAB — BASIC METABOLIC PANEL WITH GFR
Anion gap: 11 (ref 5–15)
BUN: 24 mg/dL — ABNORMAL HIGH (ref 8–23)
CO2: 22 mmol/L (ref 22–32)
Calcium: 7.9 mg/dL — ABNORMAL LOW (ref 8.9–10.3)
Chloride: 108 mmol/L (ref 98–111)
Creatinine, Ser: 1.68 mg/dL — ABNORMAL HIGH (ref 0.61–1.24)
GFR, Estimated: 38 mL/min — ABNORMAL LOW
Glucose, Bld: 89 mg/dL (ref 70–99)
Potassium: 3.2 mmol/L — ABNORMAL LOW (ref 3.5–5.1)
Sodium: 141 mmol/L (ref 135–145)

## 2024-08-25 LAB — CBC
HCT: 29.3 % — ABNORMAL LOW (ref 39.0–52.0)
Hemoglobin: 9.6 g/dL — ABNORMAL LOW (ref 13.0–17.0)
MCH: 31 pg (ref 26.0–34.0)
MCHC: 32.8 g/dL (ref 30.0–36.0)
MCV: 94.5 fL (ref 80.0–100.0)
Platelets: 176 10*3/uL (ref 150–400)
RBC: 3.1 MIL/uL — ABNORMAL LOW (ref 4.22–5.81)
RDW: 14.7 % (ref 11.5–15.5)
WBC: 12.4 10*3/uL — ABNORMAL HIGH (ref 4.0–10.5)
nRBC: 0 % (ref 0.0–0.2)

## 2024-08-25 MED ORDER — CLOPIDOGREL BISULFATE 75 MG PO TABS
75.0000 mg | ORAL_TABLET | Freq: Every day | ORAL | Status: DC
  Administered 2024-08-26 – 2024-08-30 (×5): 75 mg via ORAL
  Filled 2024-08-25 (×5): qty 1

## 2024-08-25 MED ORDER — ENSURE PLUS HIGH PROTEIN PO LIQD
237.0000 mL | Freq: Two times a day (BID) | ORAL | Status: DC
  Administered 2024-08-26 – 2024-08-30 (×9): 237 mL via ORAL

## 2024-08-25 MED ORDER — POTASSIUM CHLORIDE CRYS ER 20 MEQ PO TBCR
40.0000 meq | EXTENDED_RELEASE_TABLET | Freq: Once | ORAL | Status: AC
  Administered 2024-08-25: 40 meq via ORAL
  Filled 2024-08-25: qty 2

## 2024-08-25 MED ORDER — PANTOPRAZOLE SODIUM 40 MG PO TBEC
40.0000 mg | DELAYED_RELEASE_TABLET | Freq: Every day | ORAL | Status: DC
  Administered 2024-08-26 – 2024-08-30 (×5): 40 mg via ORAL
  Filled 2024-08-25 (×5): qty 1

## 2024-08-25 MED ORDER — FERROUS GLUCONATE 324 (38 FE) MG PO TABS
324.0000 mg | ORAL_TABLET | Freq: Two times a day (BID) | ORAL | Status: DC
  Administered 2024-08-25 – 2024-08-30 (×10): 324 mg via ORAL
  Filled 2024-08-25 (×13): qty 1

## 2024-08-25 MED ORDER — VITAMIN B-12 1000 MCG PO TABS
1000.0000 ug | ORAL_TABLET | Freq: Every day | ORAL | Status: DC
  Administered 2024-08-26 – 2024-08-30 (×5): 1000 ug via ORAL
  Filled 2024-08-25 (×5): qty 1

## 2024-08-25 MED ORDER — ORAL CARE MOUTH RINSE: 15.0000 mL | OROMUCOSAL | Status: DC | PRN

## 2024-08-25 MED ORDER — SODIUM CHLORIDE 0.9 % IV SOLN
2.0000 g | INTRAVENOUS | Status: DC
  Administered 2024-08-25 – 2024-08-29 (×5): 2 g via INTRAVENOUS
  Filled 2024-08-25 (×5): qty 12.5

## 2024-08-25 MED ORDER — ASPIRIN 81 MG PO TBEC
81.0000 mg | DELAYED_RELEASE_TABLET | Freq: Every day | ORAL | Status: DC
  Administered 2024-08-26 – 2024-08-30 (×5): 81 mg via ORAL
  Filled 2024-08-25 (×5): qty 1

## 2024-08-25 MED ORDER — ATORVASTATIN CALCIUM 40 MG PO TABS
40.0000 mg | ORAL_TABLET | Freq: Every day | ORAL | Status: DC
  Administered 2024-08-25 – 2024-08-29 (×5): 40 mg via ORAL
  Filled 2024-08-25 (×5): qty 1

## 2024-08-25 NOTE — Progress Notes (Signed)
" °   08/25/24 0916  TOC Brief Assessment  Insurance and Status Reviewed  Patient has primary care physician Yes  Home environment has been reviewed home w/ family (daughter)  Prior level of function: self with some assist  Prior/Current Home Services No current home services (Hx with [facility])  Social Drivers of Health Review SDOH reviewed no interventions necessary  Readmission risk has been reviewed Yes  Transition of care needs no transition of care needs at this time    Pt admitted from home, per chart review pt has hx with [facility] for [facility] needs. Inpatient Care Management (ICM) will continue to monitor patient advancement through interdisciplinary progression rounds. If new patient transition needs arise, please place a ICM (CM/CSW) consult. "

## 2024-08-25 NOTE — Evaluation (Signed)
 Physical Therapy Evaluation Patient Details Name: Tyler [name] MRN: [id number] DOB: March 17, 1929 Today's Date: 08/25/2024  History of Present Illness  Pt is a 89 y.o. male admitted 08/23/24 for dysuria, small amounts of diarrhea/loose stool, and weakness. He was initially brought to [facility] 07/10/24 for suspected UTI and treated with four courses of antibiotics. CT noted bladder wall thickening concerning for cystitis. rine cultures were obtained.  Review of records note previous urine cultures from the [facility] were positive for E. coli that was pansensitive. PMHx: bladder cancer s/p resection, recurrent UTI, CAD s/p CABG, diastolic heart failure, CKD stage IIIb, CVA, AAA s/p graft, right carotid artery occlusion and severe left ICA stenosis, HLD, HTN, anemia, and history of GI bleeding with AVMs.   Clinical Impression  Pt admitted with above diagnosis. PTA, pt was modI for functional mobility using RW and required some assist with ADLs and all IADLs. His daughter reports pt needing to use a w/c for the past week before admission. He lives with his wife who has dementia in house with 3 STE. Pt has prn support through his daughter as well as caregivers who are primarily attending to his wife, but can assist him. Pt currently with functional limitations due to the deficits listed below (see PT Problem List). He required modA for bed mobility, modA x2 for sit<>stand using RW, and modA for transfer using stedy. Pt is currently limited by pain, generalized deconditioning, impaired balance, and decreased activity tolerance. Pt will benefit from acute skilled PT to increase his independence and safety with mobility to allow discharge. Recommend continued inpatient follow up therapy, <3 hours/day.    If plan is discharge home, recommend the following: A lot of help with walking and/or transfers;A lot of help with bathing/dressing/bathroom;Assist for transportation;Help with stairs or ramp for entrance;Assistance with  cooking/housework;Direct supervision/assist for medications management   Can travel by private vehicle   No    Equipment Recommendations None recommended by PT  Recommendations for Other Services       Functional Status Assessment Patient has had a recent decline in their functional status and demonstrates the ability to make significant improvements in function in a reasonable and predictable amount of time.     Precautions / Restrictions Precautions Precautions: Fall Recall of Precautions/Restrictions: Impaired Restrictions Weight Bearing Restrictions Per Provider Order: No      Mobility  Bed Mobility Overal bed mobility: Needs Assistance Bed Mobility: Supine to Sit     Supine to sit: Mod assist, HOB elevated, Used rails     General bed mobility comments: Pt sat up on L side of bed with increased time. Assist to bring BLE off EOB and elevate trunk. Use of bed pad to scoot hips fwd.    Transfers Overall transfer level: Needs assistance Equipment used: Rolling walker (2 wheels), Ambulation equipment used Transfers: Sit to/from Stand, Bed to chair/wheelchair/BSC Sit to Stand: From elevated surface, Mod assist, +2 physical assistance, Via lift equipment           General transfer comment: Pt stood from raised bed height using RW. Cued proper hand placement. Powered up with modA x2. Pt quickly fatiguing and unable to maintain static stance for >20sec. Introduced stedy. Pt was able to pull to stand with modA. Transferred to recliner chair. Transfer via Lift Equipment: Stedy  Ambulation/Gait               General Gait Details: Unable at this time  Stairs  Wheelchair Mobility     Tilt Bed    Modified Rankin (Stroke Patients Only)       Balance Overall balance assessment: Needs assistance Sitting-balance support: Bilateral upper extremity supported, Feet supported Sitting balance-Leahy Scale: Fair Sitting balance - Comments: Pt sat EOB  with CGA. He required BUE support to maintain midline. Pt with left lateral and posterior lean. He c/o fear of falling. Postural control: Posterior lean, Left lateral lean Standing balance support: Bilateral upper extremity supported, During functional activity, Reliant on assistive device for balance Standing balance-Leahy Scale: Poor Standing balance comment: Pt dependent on stedy and modA.                             Pertinent Vitals/Pain Pain Assessment Pain Assessment: Faces Faces Pain Scale: Hurts little more Pain Location: Generalized with movement Pain Descriptors / Indicators: Discomfort, Sore, Tender Pain Intervention(s): Monitored during session, Limited activity within patient's tolerance, Repositioned    Home Living Family/patient expects to be discharged to:: Private residence Living Arrangements: Spouse/significant other (Wife) Available Help at Discharge: Family;Personal care attendant;Available PRN/intermittently (Wife has demenita; Daughter comes by everyday; Has caregivers from 9-11am & 5/6-9pm during the week and 9-2pm & 5-9pm on the weekend.) Type of Home: House Home Access: Stairs to enter Entrance Stairs-Rails: Right;Left;Can reach both Entrance Stairs-Number of Steps: 3   Home Layout: Two level;Able to live on main level with bedroom/bathroom (pt doesn't have to go in the basement) Home Equipment: Toilet riser;Rolling Walker (2 wheels);BSC/3in1;Grab bars - tub/shower;Wheelchair - Museum/gallery Conservator - single point      Prior Function Prior Level of Function : Needs assist       Physical Assist : ADLs (physical)   ADLs (physical): Dressing;Bathing;IADLs Mobility Comments: Ambulates using RW. Denies fall hx. Daughter reports the week before he was admitted they were using a w/c. Otherwise only used w/c to go to appts or if it was an unknown distance. ADLs Comments: Pt reports he can do self-care mostly himself. Daughter assists with  dressing, bathing, meds, finances, appointments, and transportation.     Extremity/Trunk Assessment   Upper Extremity Assessment Upper Extremity Assessment: Generalized weakness    Lower Extremity Assessment Lower Extremity Assessment: Generalized weakness    Cervical / Trunk Assessment Cervical / Trunk Assessment: Kyphotic  Communication   Communication Communication: Impaired Factors Affecting Communication: Hearing impaired    Cognition Arousal: Alert Behavior During Therapy: WFL for tasks assessed/performed   PT - Cognitive impairments: Orientation, Initiation, Sequencing   Orientation impairments: Time, Situation (Pt reported the date as January 31st 2008. He was unsure why in the hospital, besides being sick)                   PT - Cognition Comments: Pt confused. Decreased insight into current condition. Delayed processing and slow initation. Following commands: Impaired Following commands impaired: Follows one step commands with increased time     Cueing Cueing Techniques: Verbal cues, Gestural cues, Tactile cues     General Comments General comments (skin integrity, edema, etc.): Daughter present and supportive during session.    Exercises     Assessment/Plan    PT Assessment Patient needs continued PT services  PT Problem List Decreased strength;Decreased activity tolerance;Decreased balance;Decreased mobility;Decreased knowledge of use of DME;Decreased safety awareness;Pain       PT Treatment Interventions DME instruction;Gait training;Functional mobility training;Therapeutic activities;Therapeutic exercise;Stair training;Balance training;Patient/family education    PT Goals (Current goals can  be found in the Care Plan section)  Acute Rehab PT Goals Patient Stated Goal: Return Home PT Goal Formulation: With patient/family Time For Goal Achievement: 09/08/24 Potential to Achieve Goals: Fair    Frequency Min 2X/week     Co-evaluation                AM-PAC PT 6 Clicks Mobility  Outcome Measure Help needed turning from your back to your side while in a flat bed without using bedrails?: A Lot Help needed moving from lying on your back to sitting on the side of a flat bed without using bedrails?: A Lot Help needed moving to and from a bed to a chair (including a wheelchair)?: A Lot Help needed standing up from a chair using your arms (e.g., wheelchair or bedside chair)?: A Lot Help needed to walk in hospital room?: Total Help needed climbing 3-5 steps with a railing? : Total 6 Click Score: 10    End of Session Equipment Utilized During Treatment: Gait belt Activity Tolerance: Patient tolerated treatment well;Patient limited by fatigue;Patient limited by pain Patient left: in chair;with call bell/phone within reach;with chair alarm set Nurse Communication: Mobility status;Need for lift equipment (stedy) PT Visit Diagnosis: Muscle weakness (generalized) (M62.81);Difficulty in walking, not elsewhere classified (R26.2);Other abnormalities of gait and mobility (R26.89);Unsteadiness on feet (R26.81)    Time: 8691-8647 PT Time Calculation (min) (ACUTE ONLY): 44 min   Charges:   PT Evaluation $PT Eval Moderate Complexity: 1 Mod PT Treatments $Therapeutic Activity: 23-37 mins PT General Charges $$ ACUTE PT VISIT: 1 Visit         Randall SAUNDERS, PT, DPT Acute Rehabilitation Services Office: [phone] Secure Chat Preferred  Tyler [name] 08/25/2024, 2:17 PM

## 2024-08-26 LAB — BASIC METABOLIC PANEL WITH GFR
Anion gap: 10 (ref 5–15)
BUN: 22 mg/dL (ref 8–23)
CO2: 23 mmol/L (ref 22–32)
Calcium: 8.3 mg/dL — ABNORMAL LOW (ref 8.9–10.3)
Chloride: 103 mmol/L (ref 98–111)
Creatinine, Ser: 1.56 mg/dL — ABNORMAL HIGH (ref 0.61–1.24)
GFR, Estimated: 41 mL/min — ABNORMAL LOW
Glucose, Bld: 149 mg/dL — ABNORMAL HIGH (ref 70–99)
Potassium: 4.2 mmol/L (ref 3.5–5.1)
Sodium: 136 mmol/L (ref 135–145)

## 2024-08-26 LAB — CBC
HCT: 32.3 % — ABNORMAL LOW (ref 39.0–52.0)
Hemoglobin: 10.6 g/dL — ABNORMAL LOW (ref 13.0–17.0)
MCH: 31.2 pg (ref 26.0–34.0)
MCHC: 32.8 g/dL (ref 30.0–36.0)
MCV: 95 fL (ref 80.0–100.0)
Platelets: 187 10*3/uL (ref 150–400)
RBC: 3.4 MIL/uL — ABNORMAL LOW (ref 4.22–5.81)
RDW: 14.5 % (ref 11.5–15.5)
WBC: 11.4 10*3/uL — ABNORMAL HIGH (ref 4.0–10.5)
nRBC: 0 % (ref 0.0–0.2)

## 2024-08-26 LAB — URINE CULTURE: Culture: >=100000 — AB

## 2024-08-26 NOTE — TOC Initial Note (Signed)
 Transition of Care [facility]) - Initial/Assessment Note    Patient Details  Name: Tyler [name] MRN: [id number] Date of Birth: 1929/03/08  Transition of Care [facility]) CM/SW Contact:    Jeoffrey LITTIE Moose, [place] Phone Number: 08/26/2024, 10:04 AM  Clinical Narrative:                 Pt currently Ox2- CSW contacted pt daughter, Katheleen and completed SNF workup. Katheleen is in agreement with SNF following hospital DC and stated their first choice is [facility]. Katheleen also stated pt lives at home with his wife who has dementia. Katheleen stated there is a caregiver who comes to the home from 9AM-11AM Monday - Friday then Katheleen is there to help out. They also have a caregiver who comes out from 5:30PM-9PM Monday-Friday. The caregiver also comes out on the weekends from 9AM-2PM and 5PM-9PM. Pt uses a walker at home and also has a wheelchair if needed. CSW completed Fl2 and sent out SNF referral. CSW will folow up to provide bed offers.         Patient Goals and CMS Choice            Expected Discharge Plan and Services                                              Prior Living Arrangements/Services                       Activities of Daily Living   ADL Screening (condition at time of admission) Independently performs ADLs?: Yes (appropriate for developmental age) Does the patient have a NEW difficulty with bathing/dressing/toileting/self-feeding that is expected to last >3 days?: Yes (Initiates electronic notice to provider for possible OT consult) Does the patient have a NEW difficulty with getting in/out of bed, walking, or climbing stairs that is expected to last >3 days?: Yes (Initiates electronic notice to provider for possible PT consult) Does the patient have a NEW difficulty with communication that is expected to last >3 days?: Yes (Initiates electronic notice to provider for possible SLP consult) Is the patient deaf or have difficulty hearing?: Yes Does the  patient have difficulty seeing, even when wearing glasses/contacts?: Yes Does the patient have difficulty concentrating, remembering, or making decisions?: Yes  Permission Sought/Granted                  Emotional Assessment              Admission diagnosis:  Diarrhea of presumed infectious origin [R19.7] Essential hypertension, benign [I10] Coronary atherosclerosis of native coronary artery [I25.10] Weakness generalized [R53.1] Complicated urinary tract infection [N39.0] Patient Active Problem List   Diagnosis Date Noted   Complicated urinary tract infection 08/24/2024   Diarrhea 08/24/2024   Leukocytosis 08/24/2024   BPH with urinary obstruction 08/24/2024   Bilateral hydronephrosis 08/24/2024   Acute kidney injury superimposed on chronic kidney disease 08/24/2024   Generalized weakness 08/24/2024   Anemia of chronic disease 08/24/2024   Advance care planning 10/21/2023   History of stroke 10/20/2023   Chronic diastolic CHF (congestive heart failure) (HCC) 10/20/2023   CKD (chronic kidney disease), stage III (HCC) 10/20/2023   Complicated UTI (urinary tract infection) 10/20/2023   UTI (urinary tract infection) 10/19/2023   Benign neoplasm of ascending colon    Fecal occult blood test positive  GI bleed 02/25/2022   Gross hematuria 02/25/2022   Malignant neoplasm of bladder, unspecified (HCC) 02/25/2022   Symptomatic anemia 02/25/2022   ABLA (acute blood loss anemia) 02/24/2022   Physical deconditioning 03/25/2021   Transient ischemic attack (TIA) 03/25/2021   Transient ischemic attack 03/24/2021   Stenosis of right carotid artery    Arthritis of left hip 09/01/2014   AAA (abdominal aortic aneurysm)    Essential hypertension    CAD (coronary artery disease)    S/P CABG x 4 06/27/2013   Hyperlipidemia    PCP:  Milissa Savant, MD Pharmacy:   [facility] - [place], [place] - [address]  [place] [address] Phone: [phone] Fax: [phone]  [facility] [address] [place] [address] Phone: [phone] Fax: [phone]     Social Drivers of Health (SDOH) Social History: SDOH Screenings   Food Insecurity: No Food Insecurity (08/24/2024)  Housing: Low Risk (08/24/2024)  Transportation Needs: No Transportation Needs (08/24/2024)  Utilities: Not At Risk (08/24/2024)  Social Connections: Socially Integrated (08/24/2024)  Tobacco Use: Low Risk (08/24/2024)   SDOH Interventions:     Readmission Risk Interventions     No data to display

## 2024-08-26 NOTE — Plan of Care (Signed)
   Problem: Health Behavior/Discharge Planning: Goal: Ability to manage health-related needs will improve Outcome: Progressing   Problem: Clinical Measurements: Goal: Ability to maintain clinical measurements within normal limits will improve Outcome: Progressing   Problem: Activity: Goal: Risk for activity intolerance will decrease Outcome: Progressing   Problem: Nutrition: Goal: Adequate nutrition will be maintained Outcome: Progressing

## 2024-08-27 MED ORDER — DOCUSATE SODIUM 100 MG PO CAPS
100.0000 mg | ORAL_CAPSULE | Freq: Two times a day (BID) | ORAL | Status: DC
  Administered 2024-08-27 – 2024-08-30 (×7): 100 mg via ORAL
  Filled 2024-08-27 (×7): qty 1

## 2024-08-27 NOTE — Plan of Care (Signed)
  Problem: Health Behavior/Discharge Planning: Goal: Ability to manage health-related needs will improve Outcome: Progressing   Problem: Coping: Goal: Level of anxiety will decrease Outcome: Progressing   Problem: Elimination: Goal: Will not experience complications related to urinary retention Outcome: Progressing

## 2024-08-27 NOTE — TOC Progression Note (Signed)
 Transition of Care [facility]) - Progression Note    Patient Details  Name: Tyler [name] MRN: [id number] Date of Birth: February 22, 1929  Transition of Care [facility]) CM/SW Contact  Tahja Liao LITTIE Moose, [place] Phone Number: 08/27/2024, 1:22 PM  Clinical Narrative:    CSW contacted pt daughter, Tyler [name], regarding SNF bed offers. Tyler [name] stated they would like to go with [facility]. CSW confirmed bed availability with facility, pt can admit when medically ready for DC.    Expected Discharge Plan: Skilled Nursing Facility Barriers to Discharge: Continued Medical Work up, SNF Pending bed offer               Expected Discharge Plan and Services       Living arrangements for the past 2 months: Single Family Home                                       Social Drivers of Health (SDOH) Interventions SDOH Screenings   Food Insecurity: No Food Insecurity (08/24/2024)  Housing: Low Risk (08/24/2024)  Transportation Needs: No Transportation Needs (08/24/2024)  Utilities: Not At Risk (08/24/2024)  Social Connections: Socially Integrated (08/24/2024)  Tobacco Use: Low Risk (08/24/2024)    Readmission Risk Interventions     No data to display

## 2024-08-27 NOTE — Plan of Care (Signed)

## 2024-08-28 NOTE — Progress Notes (Signed)
 " PROGRESS NOTE  Tyler [name] FMW:[id number] DOB: 01/28/1929 DOA: 08/23/2024 PCP: Milissa Savant, MD   LOS: 4 days   Brief narrative:  Tyler [name] is a 89 y.o. male with a history of bladder cancer s/p resection, recurrent UTI, CAD s/p CABG, HFpEF, CKD stage IIIb, CVA, AAA s/p graft repair, carotid artery disease, anemia and GI bleed presented to hospital with dysuria.  Patient was noted to have UTI with AKI secondary to bladder outlet obstruction with bilateral hydronephrosis.  Foley catheter was placed in and was started on empiric cefepime .  Patient was then admitted hospital for further evaluation and treatment.   Assessment/Plan: Principal Problem:   Complicated urinary tract infection Active Problems:   Diarrhea   Leukocytosis   BPH with urinary obstruction   Bilateral hydronephrosis   Acute kidney injury superimposed on chronic kidney disease   Essential hypertension   Anemia of chronic disease   Generalized weakness  Complicated UTI, Pseudomonas UTI On cefepime .  Will need follow-up with urology as outpatient.  off Foley catheter at this time.  Patient follows up with [facility].  He does have dysuria with will likely need further evaluation with urology as outpatient.  Has been urinating on his pad well.  Diarrhea Unclear.  Has resolved   Acute urinary retention Presumed secondary to BPH. Patient is on alfluzosin as an outpatient. Foley catheter placed on 1/31 and has been discontinued.  Continue tamsulosin  for now.  Has been urinating well.   AKI on CKD stage IIIb Baseline creatinine of 1.4. Creatinine of 1.83 on admission.  Creatinine today at 1.5.  Initially required Foley catheter.  Currently urinating well.   BPH on alfluzosin as an outpatient . Continue Flomax  while inpatient; resume home alfluzosin on discharge   Essential hypertension Slightly elevated.  Might need antihypertensives on discharge..  Not on antihypertensives at home.   Possible  pneumonitis Noted on CT imaging. No symptoms.  No indication for antibiotic   Anemia of chronic kidney disease Continue to monitor.  Latest hemoglobin of 10.6.   Bilateral pleural effusions Minimal. Asymptomatic.    constipation Continue stool softeners CAD, history of CABG Continue aspirin , Plavix  and Lipitor.   Chronic HFpEF Appears compensated.   History of CVA History of carotid artery stenosis Continue aspirin , Plavix  and Lipitor   History of AAA Status post graft.  Currently stable.   Generalized weakness/debility Patient was seen by physical therapy who recommended skilled nursing facility placement.   DVT prophylaxis: heparin  injection 5,000 Units Start: 08/24/24 0930 Place TED hose Start: 08/24/24 9077   Disposition: Skilled nursing facility.  Status is: Inpatient Remains inpatient appropriate because: IV antibiotic, pending clinical improvement, need for rehabitation    Code Status:     Code Status: Full Code  Family Communication: I had a prolonged discussion with the patient's daughter at bedside  Consultants: None  Procedures: Foley catheter insertion and removal  Anti-infectives:  Cefepime  IV  Anti-infectives (From admission, onward)    Start     Dose/Rate Route Frequency Ordered Stop   08/25/24 1600  ceFEPIme  (MAXIPIME ) 2 g in sodium chloride  0.9 % 100 mL IVPB        2 g 200 mL/hr over 30 Minutes Intravenous Every 24 hours 08/25/24 1459     08/24/24 1000  cefTRIAXone  (ROCEPHIN ) 1 g in sodium chloride  0.9 % 100 mL IVPB  Status:  Discontinued        1 g 200 mL/hr over 30 Minutes Intravenous Every 24 hours 08/24/24 0946 08/25/24  1500        Subjective: Today, patient was seen and examined at bedside.  Patient still complains of dysuria and patient's daughter at bedside is concerned about his profound weakness and persistent urinary symptoms.  Had a prolonged discussion with the patient's daughter at bedside.  Discussed about outpatient  urology follow-up at [facility] once discharged.  Had a Foley catheter removed yesterday and has been urinating well.  Objective: Vitals:   08/28/24 0558 08/28/24 0821  BP: (!) 168/76 127/62  Pulse: 73 71  Resp: 18 14  Temp: (!) 97.5 F (36.4 C) (!) 97.3 F (36.3 C)  SpO2: 96% 97%    Intake/Output Summary (Last 24 hours) at 08/28/2024 1107 Last data filed at 08/28/2024 9178 Gross per 24 hour  Intake 3 ml  Output 350 ml  Net -347 ml   Filed Weights   08/24/24 1700  Weight: 90.3 kg   Body mass index is 28.56 kg/m.   Physical Exam: GENERAL: Patient is alert awake and acute but slightly somnolent at times, elderly, frail and weak appearing, not in obvious distress. HENT: No scleral pallor or icterus. Pupils equally reactive to light. Oral mucosa is moist NECK: is supple, no gross swelling noted. CHEST: Clear to auscultation. No crackles or wheezes.  Diminished breath sounds bilaterally. CVS: S1 and S2 heard, no murmur. Regular rate and rhythm.  ABDOMEN: Soft, non-tender, bowel sounds are present. EXTREMITIES: No edema. CNS: Cranial nerves are intact.  Generalized weakness noted.  Moves all extremities SKIN: warm and dry without rashes.  Data Review: I have personally reviewed the following laboratory data and studies,  CBC: Recent Labs  Lab 08/23/24 1538 08/25/24 0501 08/26/24 1321  WBC 18.4* 12.4* 11.4*  HGB 11.9* 9.6* 10.6*  HCT 36.1* 29.3* 32.3*  MCV 94.3 94.5 95.0  PLT 228 176 187   Basic Metabolic Panel: Recent Labs  Lab 08/23/24 1538 08/25/24 0501 08/26/24 1321  NA 139 141 136  K 3.6 3.2* 4.2  CL 103 108 103  CO2 21* 22 23  GLUCOSE 100* 89 149*  BUN 30* 24* 22  CREATININE 1.83* 1.68* 1.56*  CALCIUM  8.5* 7.9* 8.3*   Liver Function Tests: Recent Labs  Lab 08/24/24 0021  AST 32  ALT 30  ALKPHOS 86  BILITOT 0.8  PROT 6.7  ALBUMIN  3.3*   No results for input(s): LIPASE, AMYLASE in the last 168 hours. No results for input(s): AMMONIA in the  last 168 hours. Cardiac Enzymes: No results for input(s): CKTOTAL, CKMB, CKMBINDEX, TROPONINI in the last 168 hours. BNP (last 3 results) No results for input(s): BNP in the last 8760 hours.  ProBNP (last 3 results) No results for input(s): PROBNP in the last 8760 hours.  CBG: Recent Labs  Lab 08/24/24 1653  GLUCAP 111*   Recent Results (from the past 240 hours)  Resp panel by RT-PCR (RSV, Flu A&B, Covid) Anterior Nasal Swab     Status: None   Collection Time: 08/23/24  4:15 PM   Specimen: Anterior Nasal Swab  Result Value Ref Range Status   SARS Coronavirus 2 by RT PCR NEGATIVE NEGATIVE Final   Influenza A by PCR NEGATIVE NEGATIVE Final   Influenza B by PCR NEGATIVE NEGATIVE Final    Comment: (NOTE) The Xpert Xpress SARS-CoV-2/FLU/RSV plus assay is intended as an aid in the diagnosis of influenza from Nasopharyngeal swab specimens and should not be used as a sole basis for treatment. Nasal washings and aspirates are unacceptable for Xpert Xpress SARS-CoV-2/FLU/RSV testing.  Fact Sheet for Patients: bloggercourse.com  Fact Sheet for Healthcare Providers: seriousbroker.it  This test is not yet approved or cleared by the [place]  FDA and has been authorized for detection and/or diagnosis of SARS-CoV-2 by FDA under an Emergency Use Authorization (EUA). This EUA will remain in effect (meaning this test can be used) for the duration of the COVID-19 declaration under Section 564(b)(1) of the Act, 21 U.S.C. section 360bbb-3(b)(1), unless the authorization is terminated or revoked.     Resp Syncytial Virus by PCR NEGATIVE NEGATIVE Final    Comment: (NOTE) Fact Sheet for Patients: bloggercourse.com  Fact Sheet for Healthcare Providers: seriousbroker.it  This test is not yet approved or cleared by the [place]  FDA and has been authorized for detection  and/or diagnosis of SARS-CoV-2 by FDA under an Emergency Use Authorization (EUA). This EUA will remain in effect (meaning this test can be used) for the duration of the COVID-19 declaration under Section 564(b)(1) of the Act, 21 U.S.C. section 360bbb-3(b)(1), unless the authorization is terminated or revoked.  Performed at [facility], [address], [place], [place] [address]   Urine Culture     Status: Abnormal   Collection Time: 08/24/24 12:06 AM   Specimen: Urine, Random  Result Value Ref Range Status   Specimen Description URINE, RANDOM  Final   Special Requests   Final    NONE Reflexed from [id number] Performed at [facility], [address] [place], [place] [address]    Culture >=100,000 COLONIES/mL PSEUDOMONAS AERUGINOSA (A)  Final   Report Status 08/26/2024 FINAL  Final   Organism ID, Bacteria PSEUDOMONAS AERUGINOSA (A)  Final      Susceptibility   Pseudomonas aeruginosa - MIC*    MEROPENEM <=0.25 SENSITIVE Sensitive     CIPROFLOXACIN <=0.06 SENSITIVE Sensitive     IMIPENEM 2 SENSITIVE Sensitive     PIP/TAZO Value in next row Sensitive      <=4 SENSITIVEThis is a modified FDA-approved test that has been validated and its performance characteristics determined by the reporting laboratory.  This laboratory is certified under the Clinical Laboratory Improvement Amendments CLIA as qualified to perform high complexity clinical laboratory testing.    CEFEPIME  Value in next row Sensitive      <=4 SENSITIVEThis is a modified FDA-approved test that has been validated and its performance characteristics determined by the reporting laboratory.  This laboratory is certified under the Clinical Laboratory Improvement Amendments CLIA as qualified to perform high complexity clinical laboratory testing.    CEFTAZIDIME/AVIBACTAM Value in next row Sensitive      <=4 SENSITIVEThis is a modified FDA-approved test that has been validated and its performance characteristics determined  by the reporting laboratory.  This laboratory is certified under the Clinical Laboratory Improvement Amendments CLIA as qualified to perform high complexity clinical laboratory testing.    CEFTOLOZANE/TAZOBACTAM Value in next row Sensitive      <=4 SENSITIVEThis is a modified FDA-approved test that has been validated and its performance characteristics determined by the reporting laboratory.  This laboratory is certified under the Clinical Laboratory Improvement Amendments CLIA as qualified to perform high complexity clinical laboratory testing.    TOBRAMYCIN Value in next row Sensitive      <=4 SENSITIVEThis is a modified FDA-approved test that has been validated and its performance characteristics determined by the reporting laboratory.  This laboratory is certified under the Clinical Laboratory Improvement Amendments CLIA as qualified to perform high complexity clinical laboratory testing.    CEFTAZIDIME  Value in next row Sensitive      <=4 SENSITIVEThis is a modified FDA-approved test that has been validated and its performance characteristics determined by the reporting laboratory.  This laboratory is certified under the Clinical Laboratory Improvement Amendments CLIA as qualified to perform high complexity clinical laboratory testing.    * >=100,000 COLONIES/mL PSEUDOMONAS AERUGINOSA  Culture, blood (routine x 2)     Status: None (Preliminary result)   Collection Time: 08/24/24 12:20 AM   Specimen: BLOOD RIGHT ARM  Result Value Ref Range Status   Specimen Description BLOOD RIGHT ARM  Final   Special Requests   Final    BOTTLES DRAWN AEROBIC AND ANAEROBIC Blood Culture adequate volume   Culture   Final    NO GROWTH 4 DAYS Performed at [facility], [address], [place], [place] [address]    Report Status PENDING  Incomplete  Culture, blood (routine x 2)     Status: None (Preliminary result)   Collection Time: 08/24/24  1:08 AM   Specimen: BLOOD LEFT ARM  Result Value Ref Range  Status   Specimen Description BLOOD LEFT ARM  Final   Special Requests   Final    BOTTLES DRAWN AEROBIC AND ANAEROBIC Blood Culture adequate volume   Culture   Final    NO GROWTH 4 DAYS Performed at [facility], [address], [place], [place] [address]    Report Status PENDING  Incomplete  C Difficile Quick Screen w PCR reflex     Status: None   Collection Time: 08/24/24 10:11 AM   Specimen: Stool  Result Value Ref Range Status   C Diff antigen NEGATIVE NEGATIVE Final   C Diff toxin NEGATIVE NEGATIVE Final   C Diff interpretation No C. difficile detected.  Final    Comment: Performed at [facility], [address] [place], [place] [address]     Studies: No results found.    Silvie Obremski, MD  [facility] 08/28/2024  If 7PM-7AM, please contact night-coverage             "

## 2024-08-28 NOTE — Care Management Important Message (Signed)
 Important Message  Patient Details  Name: Tyler [name] MRN: [id number] Date of Birth: 22-Apr-1929   Important Message Given:  Yes - Medicare IM     Jennie Laneta Dragon 08/28/2024, 4:07 PM

## 2024-08-28 NOTE — Plan of Care (Signed)
  Problem: Clinical Measurements: Goal: Will remain free from infection Outcome: Progressing Goal: Diagnostic test results will improve Outcome: Progressing   Problem: Activity: Goal: Risk for activity intolerance will decrease Outcome: Progressing   Problem: Nutrition: Goal: Adequate nutrition will be maintained Outcome: Progressing   Problem: Elimination: Goal: Will not experience complications related to bowel motility Outcome: Progressing Goal: Will not experience complications related to urinary retention Outcome: Progressing   Problem: Pain Managment: Goal: General experience of comfort will improve and/or be controlled Outcome: Progressing   Problem: Safety: Goal: Ability to remain free from injury will improve Outcome: Progressing

## 2024-08-28 NOTE — Plan of Care (Signed)
  Problem: Health Behavior/Discharge Planning: Goal: Ability to manage health-related needs will improve Outcome: Progressing   Problem: Coping: Goal: Level of anxiety will decrease Outcome: Progressing   Problem: Skin Integrity: Goal: Risk for impaired skin integrity will decrease Outcome: Progressing

## 2024-08-28 NOTE — Progress Notes (Signed)
 Patient experiencing pain while voiding this morning. Pt did void due time last night post foley cath removal. PRN Tylenol  given.

## 2024-08-29 LAB — CBC
HCT: 32.8 % — ABNORMAL LOW (ref 39.0–52.0)
Hemoglobin: 10.7 g/dL — ABNORMAL LOW (ref 13.0–17.0)
MCH: 30.7 pg (ref 26.0–34.0)
MCHC: 32.6 g/dL (ref 30.0–36.0)
MCV: 94.3 fL (ref 80.0–100.0)
Platelets: 225 10*3/uL (ref 150–400)
RBC: 3.48 MIL/uL — ABNORMAL LOW (ref 4.22–5.81)
RDW: 14.6 % (ref 11.5–15.5)
WBC: 13 10*3/uL — ABNORMAL HIGH (ref 4.0–10.5)
nRBC: 0 % (ref 0.0–0.2)

## 2024-08-29 LAB — BASIC METABOLIC PANEL WITH GFR
Anion gap: 8 (ref 5–15)
BUN: 26 mg/dL — ABNORMAL HIGH (ref 8–23)
CO2: 27 mmol/L (ref 22–32)
Calcium: 8.8 mg/dL — ABNORMAL LOW (ref 8.9–10.3)
Chloride: 105 mmol/L (ref 98–111)
Creatinine, Ser: 1.36 mg/dL — ABNORMAL HIGH (ref 0.61–1.24)
GFR, Estimated: 49 mL/min — ABNORMAL LOW
Glucose, Bld: 98 mg/dL (ref 70–99)
Potassium: 4.4 mmol/L (ref 3.5–5.1)
Sodium: 140 mmol/L (ref 135–145)

## 2024-08-29 LAB — CULTURE, BLOOD (ROUTINE X 2)
Culture: NO GROWTH
Culture: NO GROWTH
Special Requests: ADEQUATE
Special Requests: ADEQUATE

## 2024-08-29 MED ORDER — FLAVOXATE HCL 100 MG PO TABS
100.0000 mg | ORAL_TABLET | Freq: Three times a day (TID) | ORAL | Status: DC | PRN
  Administered 2024-08-29 – 2024-08-30 (×3): 100 mg via ORAL
  Filled 2024-08-29 (×5): qty 1

## 2024-08-29 NOTE — Progress Notes (Addendum)
 Physical Therapy Treatment Patient Details Name: Tyler [name] MRN: [id number] DOB: [birth year] Today's Date: 08/29/2024   History of Present Illness Pt is a 89 y.o. male admitted 08/23/24 for dysuria, small amounts of diarrhea/loose stool, and weakness. He was initially brought to [facility] 07/10/24 for suspected UTI and treated with four courses of antibiotics. CT noted bladder wall thickening concerning for cystitis. rine cultures were obtained.  Review of records note previous urine cultures from the [place] were positive for E. coli that was pansensitive. PMHx: bladder cancer s/p resection, recurrent UTI, CAD s/p CABG, diastolic heart failure, CKD stage IIIb, CVA, AAA s/p graft, right carotid artery occlusion and severe left ICA stenosis, HLD, HTN, anemia, and history of GI bleeding with AVMs.    PT Comments  Pt received in chair, pt alert and agreeable to second therapy session as nursing staff requesting assist to transfer him back from chair to bed and pt more fatigued, +2 necessary for pt safety due to high fall risk. Pt would benefit from wearing BLE compression if not contraindicated due to drop in BP from supine to seated postures, even with BLE exercises. Pt with good participation in transfer training with Stedy sit to stand platform and totalA to pivot from chair to bed due to pt fatigue and inability to weight shift in stance. Pt participated in supine BLE exercises for strengthening/ROM in bed chair posture at end of session, but fatigues quickly. Reinforced benefits of supine/seated LE exercises TID, daughter present and receptive. Patient will benefit from continued inpatient follow up therapy, <3 hours/day.    If plan is discharge home, recommend the following: A lot of help with bathing/dressing/bathroom;Assist for transportation;Help with stairs or ramp for entrance;Assistance with cooking/housework;Direct supervision/assist for medications management;Two people to help with walking and/or  transfers;Supervision due to cognitive status   Can travel by private vehicle     No  Equipment Recommendations  None recommended by PT (daughter asking about getting a Geographical information systems officer for home from [place] [facility])    Recommendations for Other Services OT consult (if pt does not DC in next 24 hours)     Precautions / Restrictions Precautions Precautions: Fall Recall of Precautions/Restrictions: Impaired Precaution/Restrictions Comments: check BP with postural changes if symptomatic (pt has TED hose in room PRN), pt has geomat cushion now Restrictions Weight Bearing Restrictions Per Provider Order: No     Mobility  Bed Mobility Overal bed mobility: Needs Assistance Bed Mobility: Rolling, Sit to Sidelying Rolling: Mod assist, Used rails Sidelying to sit: HOB elevated, Used rails, Max assist     Sit to sidelying: Mod assist, +2 for safety/equipment, Used rails General bed mobility comments: Cues for reverse log roll for comfort/due to bottom and back pain. BLE assist needed over EOB. Pt able to perform a few lateral supine bridges to scoot hips toward middle of the bed. Pt assisting with posterior supine scoot toward HOB with use of overhead rail and bed in partial trend position.    Transfers Overall transfer level: Needs assistance Equipment used: Ambulation equipment used Transfers: Sit to/from Stand, Bed to chair/wheelchair/BSC Sit to Stand: From elevated surface, Mod assist, +2 physical assistance, +2 safety/equipment, Via lift equipment           General transfer comment: From chair> Stedy and Stedy>EOB. once on EOB, pt encouraged to attempt posterior scooting back further on to his bed, but poor propulsion achieved with +2 HHA and feet blocked. Transfer via Lift Equipment: Stedy  Ambulation/Gait Ambulation/Gait assistance: +2 physical assistance  Assistive device: 2 person hand held assist       Pre-gait activities: Attempting standing weight shifting with +2 HHA  but pt appears anxious/shaky, states i can't, only able to slightly raise one heel at a time a few reps but unable to march. General Gait Details: pt not yet able; fatigues too quickly for weight shifting in [employer]             Wheelchair Mobility     Tilt Bed    Modified Rankin (Stroke Patients Only)       Balance Overall balance assessment: Needs assistance Sitting-balance support: Bilateral upper extremity supported, Feet supported, No upper extremity supported Sitting balance-Leahy Scale: Poor Sitting balance - Comments: after scooting to foot flat, pt sitting wtih Supervision for safety with BUE support; poor dynamic seated balance when unsupported Postural control: Posterior lean Standing balance support: Bilateral upper extremity supported, During functional activity, Reliant on assistive device for balance Standing balance-Leahy Scale: Poor Standing balance comment: +2 HHA or Stedy rails required to maintain                            Communication Communication Communication: Impaired Factors Affecting Communication: Hearing impaired  Cognition Arousal: Alert Behavior During Therapy: WFL for tasks assessed/performed   PT - Cognitive impairments: Orientation, Initiation, Sequencing, Problem solving, Safety/Judgement   Orientation impairments: Situation                   PT - Cognition Comments: Pt cooperative but poor recall of sequencing to perform bed mobility and transfers. Delayed processing and slow initation. Multimodal cues helpful. Daughter present and encouraging. Pt appears fearful of falls and very stiff/guarding. Following commands: Impaired Following commands impaired: Follows one step commands with increased time, Only follows one step commands consistently    Cueing Cueing Techniques: Verbal cues, Gestural cues, Tactile cues  Exercises Other Exercises Other Exercises: Bed chair posture, hip ad/abduction, hip  flexion, SAQ x5-10 reps ea Other Exercises: Reviewed IS frequency 5-10x hourly Other Exercises: attempted standing marches or heel raises, pt unable, lifts one heel at a time a few reps wtih +2 HHA    General Comments General comments (skin integrity, edema, etc.): BP 112/59 in chair after previous session, nursing took it again ~10 mins after pt transferred to chair (feet down) and reading 101/54 HR 83 bpm, so likely still orthostatic, recommend pt wear BLE compression socks next session when he gets OOB, discussed with pt and daughter (he has compression socks in his room but had them removed a couple days ago due to leg discomfort).      Pertinent Vitals/Pain Pain Assessment Pain Assessment: Faces Faces Pain Scale: Hurts little more Pain Location: bottom and lower back and c/o stinging with urination into his briefs Pain Descriptors / Indicators: Discomfort, Sore, Tender, Grimacing, Guarding Pain Intervention(s): Limited activity within patient's tolerance, Monitored during session, Repositioned    Home Living                          Prior Function            PT Goals (current goals can now be found in the care plan section) Acute Rehab PT Goals Patient Stated Goal: Return home PT Goal Formulation: With patient/family Time For Goal Achievement: 09/08/24 Progress towards PT goals: Progressing toward goals    Frequency    Min 2X/week  PT Plan      Co-evaluation              AM-PAC PT 6 Clicks Mobility   Outcome Measure  Help needed turning from your back to your side while in a flat bed without using bedrails?: A Lot Help needed moving from lying on your back to sitting on the side of a flat bed without using bedrails?: Total (w/o rails) Help needed moving to and from a bed to a chair (including a wheelchair)?: Total (+2 stedy) Help needed standing up from a chair using your arms (e.g., wheelchair or bedside chair)?: A Lot Help needed to walk  in hospital room?: Total Help needed climbing 3-5 steps with a railing? : Total 6 Click Score: 8    End of Session Equipment Utilized During Treatment: Gait belt Activity Tolerance: Patient tolerated treatment well;Other (comment);Patient limited by fatigue (fear of falls, bottom pain) Patient left: with call bell/phone within reach;with chair alarm set;in bed;with bed alarm set;Other (comment) (pt heels floated, bed in chair posture to await his dinner) Nurse Communication: Mobility status;Need for lift equipment (stedy and +2 for safety with gait belt) PT Visit Diagnosis: Muscle weakness (generalized) (M62.81);Difficulty in walking, not elsewhere classified (R26.2);Other abnormalities of gait and mobility (R26.89);Unsteadiness on feet (R26.81)     Time: 8243-8193 PT Time Calculation (min) (ACUTE ONLY): 10 min  Charges:    $Therapeutic Activity: 8-22 mins PT General Charges $$ ACUTE PT VISIT: 1 Visit                     Yaseen Gilberg P., PTA Acute Rehabilitation Services Secure Chat Preferred 9a-5:30pm Office: [phone]    Connell HERO [facility] 08/29/2024, 6:51 PM

## 2024-08-29 NOTE — Plan of Care (Signed)
  Problem: Clinical Measurements: Goal: Will remain free from infection Outcome: Progressing Goal: Diagnostic test results will improve Outcome: Progressing   Problem: Activity: Goal: Risk for activity intolerance will decrease Outcome: Progressing   Problem: Nutrition: Goal: Adequate nutrition will be maintained Outcome: Progressing

## 2024-08-29 NOTE — Progress Notes (Signed)
 " PROGRESS NOTE  Tyler [name] FMW:[id number] DOB: [birth year] DOA: 08/23/2024 PCP: Milissa Savant, MD   LOS: 5 days   Brief narrative:  Tyler [name] is a 89 y.o. male with a history of bladder cancer s/p resection, recurrent UTI, CAD s/p CABG, HFpEF, CKD stage IIIb, CVA, AAA s/p graft repair, carotid artery disease, anemia and GI bleed presented to hospital with dysuria.  Patient was noted to have UTI with AKI secondary to bladder outlet obstruction with bilateral hydronephrosis.  Foley catheter was placed in and was started on empiric cefepime .  Patient was then admitted to the hospital for further evaluation and treatment.   Assessment/Plan: Principal Problem:   Complicated urinary tract infection Active Problems:   Diarrhea   Leukocytosis   BPH with urinary obstruction   Bilateral hydronephrosis   Acute kidney injury superimposed on chronic kidney disease   Essential hypertension   Anemia of chronic disease   Generalized weakness  Complicated UTI, Pseudomonas UTI On cefepime  since 08/26/2023.  Will need follow-up with urology as outpatient.  Patient follows up with urology at [facility] in the [place] and had recent follow-up with what it looks like cystoscopy as well.  Off Foley catheter at this time.  Does have some burning urination only during urinating..  Have added flavoxate and encouraged to discuss with urology after discharge.  Will continue the course of antibiotic   diarrhea Unclear.  Has resolved   Acute urinary retention Presumed secondary to BPH. Patient is on alfluzosin as an outpatient. Foley catheter placed on 1/31 and has been discontinued.  Continue tamsulosin  for now.  Has been urinating well.   AKI on CKD stage IIIb Baseline creatinine of 1.4. Creatinine of 1.83 on admission.  Creatinine today at 1. 1.3.  Initially required Foley catheter.  Currently urinating well.   BPH on alfluzosin as an outpatient . Continue Flomax  while inpatient; resume home alfluzosin on  discharge   Essential hypertension .  Not on antihypertensives at home.     Possible pneumonitis Noted on CT imaging. No symptoms.  No indication for antibiotic   Anemia of chronic kidney disease Continue to monitor.  Latest hemoglobin of 10.6.   Bilateral pleural effusions Minimal. Asymptomatic.    constipation Continue stool softeners  CAD, history of CABG Continue aspirin , Plavix  and Lipitor.   Chronic HFpEF Appears compensated.   History of CVA History of carotid artery stenosis Continue aspirin , Plavix  and Lipitor   History of AAA Status post graft.  Currently stable.   Generalized weakness/debility, frailty, advanced age Patient was seen by physical therapy and recommended skilled nursing facility placement.   DVT prophylaxis: heparin  injection 5,000 Units Start: 08/24/24 0930 Place TED hose Start: 08/24/24 9077   Disposition: Skilled nursing facility likely 08/30/2024.  Status is: Inpatient Remains inpatient appropriate because: IV antibiotic, pending clinical improvement, need for rehabitation    Code Status:     Code Status: Full Code  Family Communication: I had a prolonged discussion with the patient's daughter at bedside again today at length and discussed about follow-up plans after discharge.  Consultants: None  Procedures: Foley catheter insertion and removal  Anti-infectives:  Cefepime  IV  Anti-infectives (From admission, onward)    Start     Dose/Rate Route Frequency Ordered Stop   08/25/24 1600  ceFEPIme  (MAXIPIME ) 2 g in sodium chloride  0.9 % 100 mL IVPB        2 g 200 mL/hr over 30 Minutes Intravenous Every 24 hours 08/25/24 1459     08/24/24  1000  cefTRIAXone  (ROCEPHIN ) 1 g in sodium chloride  0.9 % 100 mL IVPB  Status:  Discontinued        1 g 200 mL/hr over 30 Minutes Intravenous Every 24 hours 08/24/24 0946 08/25/24 1500        Subjective: Today, patient was seen and examined at bedside.  Patient alert awake and  Communicative.  Patient's daughter at bedside.  Daughter still concerned about dysuria.  No fever or chills.  Patient still weak and deconditioned but overall better.  Has been eating some.  Voiding well.    Objective: Vitals:   08/29/24 0816 08/29/24 0923  BP: 119/61 (!) 99/54  Pulse: 68 76  Resp: 16 17  Temp: 98.1 F (36.7 C) (!) 97.5 F (36.4 C)  SpO2: 93% 97%    Intake/Output Summary (Last 24 hours) at 08/29/2024 1118 Last data filed at 08/29/2024 0900 Gross per 24 hour  Intake 240 ml  Output --  Net 240 ml   Filed Weights   08/24/24 1700  Weight: 90.3 kg   Body mass index is 28.56 kg/m.   Physical Exam: GENERAL: Patient is alert awake and Communicative, hard of hearing, elderly male, appears weak and deconditioned.SABRA HENT: No scleral pallor or icterus. Pupils equally reactive to light. Oral mucosa is moist NECK: is supple, no gross swelling noted. CHEST: Clear to auscultation. No crackles or wheezes.  Diminished breath sounds bilaterally. CVS: S1 and S2 heard, no murmur. Regular rate and rhythm.  ABDOMEN: Soft, non-tender, bowel sounds are present. EXTREMITIES: No edema. CNS: Cranial nerves are intact.  Generalized weakness noted.  Moves all extremities SKIN: warm and dry without rashes.  Data Review: I have personally reviewed the following laboratory data and studies,  CBC: Recent Labs  Lab 08/23/24 1538 08/25/24 0501 08/26/24 1321 08/29/24 0435  WBC 18.4* 12.4* 11.4* 13.0*  HGB 11.9* 9.6* 10.6* 10.7*  HCT 36.1* 29.3* 32.3* 32.8*  MCV 94.3 94.5 95.0 94.3  PLT 228 176 187 225   Basic Metabolic Panel: Recent Labs  Lab 08/23/24 1538 08/25/24 0501 08/26/24 1321 08/29/24 0435  NA 139 141 136 140  K 3.6 3.2* 4.2 4.4  CL 103 108 103 105  CO2 21* 22 23 27   GLUCOSE 100* 89 149* 98  BUN 30* 24* 22 26*  CREATININE 1.83* 1.68* 1.56* 1.36*  CALCIUM  8.5* 7.9* 8.3* 8.8*   Liver Function Tests: Recent Labs  Lab 08/24/24 0021  AST 32  ALT 30  ALKPHOS 86   BILITOT 0.8  PROT 6.7  ALBUMIN  3.3*   No results for input(s): LIPASE, AMYLASE in the last 168 hours. No results for input(s): AMMONIA in the last 168 hours. Cardiac Enzymes: No results for input(s): CKTOTAL, CKMB, CKMBINDEX, TROPONINI in the last 168 hours. BNP (last 3 results) No results for input(s): BNP in the last 8760 hours.  ProBNP (last 3 results) No results for input(s): PROBNP in the last 8760 hours.  CBG: Recent Labs  Lab 08/24/24 1653  GLUCAP 111*   Recent Results (from the past 240 hours)  Resp panel by RT-PCR (RSV, Flu A&B, Covid) Anterior Nasal Swab     Status: None   Collection Time: 08/23/24  4:15 PM   Specimen: Anterior Nasal Swab  Result Value Ref Range Status   SARS Coronavirus 2 by RT PCR NEGATIVE NEGATIVE Final   Influenza A by PCR NEGATIVE NEGATIVE Final   Influenza B by PCR NEGATIVE NEGATIVE Final    Comment: (NOTE) The Xpert Xpress SARS-CoV-2/FLU/RSV plus assay  is intended as an aid in the diagnosis of influenza from Nasopharyngeal swab specimens and should not be used as a sole basis for treatment. Nasal washings and aspirates are unacceptable for Xpert Xpress SARS-CoV-2/FLU/RSV testing.  Fact Sheet for Patients: bloggercourse.com  Fact Sheet for Healthcare Providers: seriousbroker.it  This test is not yet approved or cleared by the [place]  FDA and has been authorized for detection and/or diagnosis of SARS-CoV-2 by FDA under an Emergency Use Authorization (EUA). This EUA will remain in effect (meaning this test can be used) for the duration of the COVID-19 declaration under Section 564(b)(1) of the Act, 21 U.S.C. section 360bbb-3(b)(1), unless the authorization is terminated or revoked.     Resp Syncytial Virus by PCR NEGATIVE NEGATIVE Final    Comment: (NOTE) Fact Sheet for Patients: bloggercourse.com  Fact Sheet for Healthcare  Providers: seriousbroker.it  This test is not yet approved or cleared by the [place]  FDA and has been authorized for detection and/or diagnosis of SARS-CoV-2 by FDA under an Emergency Use Authorization (EUA). This EUA will remain in effect (meaning this test can be used) for the duration of the COVID-19 declaration under Section 564(b)(1) of the Act, 21 U.S.C. section 360bbb-3(b)(1), unless the authorization is terminated or revoked.  Performed at [facility], [address], [place], [place] [address]   Urine Culture     Status: Abnormal   Collection Time: 08/24/24 12:06 AM   Specimen: Urine, Random  Result Value Ref Range Status   Specimen Description URINE, RANDOM  Final   Special Requests   Final    NONE Reflexed from [id number] Performed at [facility], [address] [place], [place] [address]    Culture >=100,000 COLONIES/mL PSEUDOMONAS AERUGINOSA (A)  Final   Report Status 08/26/2024 FINAL  Final   Organism ID, Bacteria PSEUDOMONAS AERUGINOSA (A)  Final      Susceptibility   Pseudomonas aeruginosa - MIC*    MEROPENEM <=0.25 SENSITIVE Sensitive     CIPROFLOXACIN <=0.06 SENSITIVE Sensitive     IMIPENEM 2 SENSITIVE Sensitive     PIP/TAZO Value in next row Sensitive      <=4 SENSITIVEThis is a modified FDA-approved test that has been validated and its performance characteristics determined by the reporting laboratory.  This laboratory is certified under the Clinical Laboratory Improvement Amendments CLIA as qualified to perform high complexity clinical laboratory testing.    CEFEPIME  Value in next row Sensitive      <=4 SENSITIVEThis is a modified FDA-approved test that has been validated and its performance characteristics determined by the reporting laboratory.  This laboratory is certified under the Clinical Laboratory Improvement Amendments CLIA as qualified to perform high complexity clinical laboratory testing.     CEFTAZIDIME/AVIBACTAM Value in next row Sensitive      <=4 SENSITIVEThis is a modified FDA-approved test that has been validated and its performance characteristics determined by the reporting laboratory.  This laboratory is certified under the Clinical Laboratory Improvement Amendments CLIA as qualified to perform high complexity clinical laboratory testing.    CEFTOLOZANE/TAZOBACTAM Value in next row Sensitive      <=4 SENSITIVEThis is a modified FDA-approved test that has been validated and its performance characteristics determined by the reporting laboratory.  This laboratory is certified under the Clinical Laboratory Improvement Amendments CLIA as qualified to perform high complexity clinical laboratory testing.    TOBRAMYCIN Value in next row Sensitive      <=4 SENSITIVEThis is a modified FDA-approved test that  has been validated and its performance characteristics determined by the reporting laboratory.  This laboratory is certified under the Clinical Laboratory Improvement Amendments CLIA as qualified to perform high complexity clinical laboratory testing.    CEFTAZIDIME Value in next row Sensitive      <=4 SENSITIVEThis is a modified FDA-approved test that has been validated and its performance characteristics determined by the reporting laboratory.  This laboratory is certified under the Clinical Laboratory Improvement Amendments CLIA as qualified to perform high complexity clinical laboratory testing.    * >=100,000 COLONIES/mL PSEUDOMONAS AERUGINOSA  Culture, blood (routine x 2)     Status: None   Collection Time: 08/24/24 12:20 AM   Specimen: BLOOD RIGHT ARM  Result Value Ref Range Status   Specimen Description BLOOD RIGHT ARM  Final   Special Requests   Final    BOTTLES DRAWN AEROBIC AND ANAEROBIC Blood Culture adequate volume   Culture   Final    NO GROWTH 5 DAYS Performed at [facility], [address] [place], [place] [address]    Report Status 08/29/2024 FINAL  Final   Culture, blood (routine x 2)     Status: None   Collection Time: 08/24/24  1:08 AM   Specimen: BLOOD LEFT ARM  Result Value Ref Range Status   Specimen Description BLOOD LEFT ARM  Final   Special Requests   Final    BOTTLES DRAWN AEROBIC AND ANAEROBIC Blood Culture adequate volume   Culture   Final    NO GROWTH 5 DAYS Performed at [facility], [address] [place], [place] [address]    Report Status 08/29/2024 FINAL  Final  C Difficile Quick Screen w PCR reflex     Status: None   Collection Time: 08/24/24 10:11 AM   Specimen: Stool  Result Value Ref Range Status   C Diff antigen NEGATIVE NEGATIVE Final   C Diff toxin NEGATIVE NEGATIVE Final   C Diff interpretation No C. difficile detected.  Final    Comment: Performed at [facility], [address], [place], [place] [address]     Studies: No results found.    Unice Vantassel, MD  [facility] 08/29/2024  If 7PM-7AM, please contact night-coverage             "

## 2024-08-29 NOTE — Progress Notes (Signed)
 Physical Therapy Treatment Patient Details Name: Tyler [name] MRN: [id number] DOB: [birth year] Today's Date: 08/29/2024   History of Present Illness Pt is a 89 y.o. male admitted 08/23/24 for dysuria, small amounts of diarrhea/loose stool, and weakness. He was initially brought to [facility] 07/10/24 for suspected UTI and treated with four courses of antibiotics. CT noted bladder wall thickening concerning for cystitis. rine cultures were obtained.  Review of records note previous urine cultures from the [facility] were positive for E. coli that was pansensitive. PMHx: bladder cancer s/p resection, recurrent UTI, CAD s/p CABG, diastolic heart failure, CKD stage IIIb, CVA, AAA s/p graft, right carotid artery occlusion and severe left ICA stenosis, HLD, HTN, anemia, and history of GI bleeding with AVMs.    PT Comments  Pt received in supine, agreeable to therapy session with encouragement, pt expressed fear of falls with transfer to EOB and standing trials. Pt able to stand to/from [place] and totalA transfer to chair from EOB in [employer]. Pt then able to stand from cushioned chair with +2 HHA modA. Pt quick to fatigue and symptomatic drop in BP MAP with supine to sitting, but stable from sitting EOB to sitting in chair. Unable to assess BP standing due to need to physically assist him and pt fatigues too quickly for full reading. Briefs changed during session due to significant urination, pt c/o pain with urination. Patient will benefit from continued inpatient follow up therapy, <3 hours/day.    If plan is discharge home, recommend the following: A lot of help with bathing/dressing/bathroom;Assist for transportation;Help with stairs or ramp for entrance;Assistance with cooking/housework;Direct supervision/assist for medications management;Two people to help with walking and/or transfers;Supervision due to cognitive status   Can travel by private vehicle     No  Equipment Recommendations  None recommended by PT  (daughter asking about getting a Geographical information systems officer for home from [facility])    Recommendations for Other Services OT consult (if pt does not DC in next 24 hours)     Precautions / Restrictions Precautions Precautions: Fall Recall of Precautions/Restrictions: Impaired Precaution/Restrictions Comments: check BP with postural changes if symptomatic (pt has TED hose in room PRN), pt has geomat cushion now Restrictions Weight Bearing Restrictions Per Provider Order: No     Mobility  Bed Mobility Overal bed mobility: Needs Assistance Bed Mobility: Rolling, Sidelying to Sit Rolling: Mod assist, Used rails Sidelying to sit: HOB elevated, Used rails, Max assist       General bed mobility comments: to R EOB, increased time/cues to perform. Pt does better using bil foot support to bridge hips toward EOB prior to pushing up; log roll for comfort and so pt can use bed rail more effectively. Pt expressed fear of falls prior to raising trunk.    Transfers Overall transfer level: Needs assistance Equipment used: 2 person hand held assist, Ambulation equipment used Transfers: Sit to/from Stand, Bed to chair/wheelchair/BSC Sit to Stand: From elevated surface, Mod assist, +2 physical assistance, +2 safety/equipment, Via lift equipment           General transfer comment: STS from elevated bed to Stedy and Stedy to chair with standing ~90 seconds for removal of urine soiled briefs (sides of briefs cut for energy conservation and quick removal) and anterior/posterior wiping and donning of second pair of briefs while pt still standing. Pt then stood from cushioned chair with +2 HHA (modA) and stood ~20 seconds prior to c/o fatigue and needing to sit down. Pt c/o bottom pain with attempts  to elevate his legs in recliner so pt kept his feet down in chair (alarm on and daughter present). Transfer via Lift Equipment: Stedy  Ambulation/Gait Ambulation/Gait assistance: +2 physical assistance   Assistive  device: 2 person hand held assist       Pre-gait activities: Attempting standing weight shifting with +2 HHA but pt appears anxious/shaky, states i can't, only able to slightly raise one heel at a time a few reps but unable to march. General Gait Details: Unable at this time   Optometrist     Tilt Bed    Modified Rankin (Stroke Patients Only)       Balance Overall balance assessment: Needs assistance Sitting-balance support: Bilateral upper extremity supported, Feet supported, No upper extremity supported Sitting balance-Leahy Scale: Poor Sitting balance - Comments: after scooting to foot flat, pt sitting wtih Supervision for safety with BUE support; poor dynamic seated balance when unsupported Postural control: Posterior lean Standing balance support: Bilateral upper extremity supported, During functional activity, Reliant on assistive device for balance Standing balance-Leahy Scale: Poor Standing balance comment: +2 HHA or Stedy rails required to maintain                            Communication Communication Communication: Impaired Factors Affecting Communication: Hearing impaired  Cognition Arousal: Alert Behavior During Therapy: WFL for tasks assessed/performed   PT - Cognitive impairments: Orientation, Initiation, Sequencing, Problem solving, Safety/Judgement   Orientation impairments: Situation                   PT - Cognition Comments: Pt cooperative but poor recall of sequencing to perform bed mobility and transfers. Delayed processing and slow initation. Multimodal cues helpful. Daughter present and encouraging. Pt appears fearful of falls and very stiff/guarding. Following commands: Impaired Following commands impaired: Follows one step commands with increased time, Only follows one step commands consistently    Cueing Cueing Techniques: Verbal cues, Gestural cues, Tactile cues  Exercises Other  Exercises Other Exercises: seated BLE AROM: hip flexion, LAQ, hip ab/adduction x10 reps ea (visual/tactile cues) Other Exercises: Reviewed IS frequency 5-10x hourly Other Exercises: attempted standing marches or heel raises, pt unable, lifts one heel at a time a few reps wtih +2 HHA    General Comments General comments (skin integrity, edema, etc.): see BP in comments; symptomatic drop in MAP with transfer from supine to sitting      Pertinent Vitals/Pain Pain Assessment Pain Assessment: Faces Faces Pain Scale: Hurts little more Pain Location: bottom and lower back and c/o stinging with urination into his briefs Pain Descriptors / Indicators: Discomfort, Sore, Tender, Grimacing, Guarding Pain Intervention(s): Limited activity within patient's tolerance, Monitored during session, Repositioned    Home Living                          Prior Function            PT Goals (current goals can now be found in the care plan section) Acute Rehab PT Goals Patient Stated Goal: Return home PT Goal Formulation: With patient/family Time For Goal Achievement: 09/08/24 Progress towards PT goals: Progressing toward goals    Frequency    Min 2X/week      PT Plan      Co-evaluation              AM-PAC PT  6 Clicks Mobility   Outcome Measure  Help needed turning from your back to your side while in a flat bed without using bedrails?: A Lot Help needed moving from lying on your back to sitting on the side of a flat bed without using bedrails?: Total (w/o rails) Help needed moving to and from a bed to a chair (including a wheelchair)?: Total (+2 stedy) Help needed standing up from a chair using your arms (e.g., wheelchair or bedside chair)?: A Lot Help needed to walk in hospital room?: Total Help needed climbing 3-5 steps with a railing? : Total 6 Click Score: 8    End of Session Equipment Utilized During Treatment: Gait belt Activity Tolerance: Patient tolerated  treatment well;Patient limited by pain;Other (comment) (fear of falls, bottom pain) Patient left: in chair;with call bell/phone within reach;with chair alarm set Nurse Communication: Mobility status;Need for lift equipment (stedy and +2 for safety with gait belt) PT Visit Diagnosis: Muscle weakness (generalized) (M62.81);Difficulty in walking, not elsewhere classified (R26.2);Other abnormalities of gait and mobility (R26.89);Unsteadiness on feet (R26.81)     Time: 8399-8364 PT Time Calculation (min) (ACUTE ONLY): 35 min  Charges:    $Therapeutic Exercise: 8-22 mins $Therapeutic Activity: 8-22 mins PT General Charges $$ ACUTE PT VISIT: 1 Visit                     Dreshaun Stene P., PTA Acute Rehabilitation Services Secure Chat Preferred 9a-5:30pm Office: [phone]    [facility] 08/29/2024, 4:57 PM

## 2024-08-30 MED ORDER — SODIUM CHLORIDE 0.9 % IV SOLN
2.0000 g | INTRAVENOUS | Status: DC
  Administered 2024-08-30: 2 g via INTRAVENOUS
  Filled 2024-08-30: qty 12.5

## 2024-08-30 MED ORDER — ENSURE PLUS HIGH PROTEIN PO LIQD: 237.0000 mL | Freq: Two times a day (BID) | ORAL | Status: AC

## 2024-08-30 MED ORDER — FLAVOXATE HCL 100 MG PO TABS: 100.0000 mg | ORAL_TABLET | Freq: Three times a day (TID) | ORAL | Status: AC | PRN

## 2024-08-30 MED ORDER — ACETAMINOPHEN 325 MG PO TABS: 650.0000 mg | ORAL_TABLET | Freq: Four times a day (QID) | ORAL | Status: AC | PRN

## 2024-08-30 MED ORDER — DOCUSATE SODIUM 100 MG PO CAPS: 100.0000 mg | ORAL_CAPSULE | Freq: Two times a day (BID) | ORAL | Status: AC

## 2024-08-30 MED ORDER — ONDANSETRON HCL 4 MG PO TABS: 4.0000 mg | ORAL_TABLET | Freq: Three times a day (TID) | ORAL | Status: AC | PRN

## 2024-08-30 MED ORDER — SACCHAROMYCES BOULARDII 250 MG PO CAPS: 250.0000 mg | ORAL_CAPSULE | Freq: Two times a day (BID) | ORAL | Status: AC

## 2024-08-30 NOTE — NC FL2 (Signed)
 " [place]  MEDICAID FL2 LEVEL OF CARE FORM     IDENTIFICATION  Patient Name: Tyler [name] Birthdate: [birth year] Sex: male Admission Date (Current Location): 08/23/2024  [facility] and [place] Number:  Producer, Television/film/video and Address:  [facility], [address] [place], [place] [address]      Provider Number: [id number]  Attending Physician Name and Address:  Sonjia Held, MD  Relative Name and Phone Number:  Anniece (Daughter)  [phone]    Current Level of Care: Hospital Recommended Level of Care: Skilled Nursing Facility Prior Approval Number:    Date Approved/Denied:   PASRR Number: [id number]  Discharge Plan: SNF    Current Diagnoses: Patient Active Problem List   Diagnosis Date Noted   Complicated urinary tract infection 08/24/2024   Diarrhea 08/24/2024   Leukocytosis 08/24/2024   BPH with urinary obstruction 08/24/2024   Bilateral hydronephrosis 08/24/2024   Acute kidney injury superimposed on chronic kidney disease 08/24/2024   Generalized weakness 08/24/2024   Anemia of chronic disease 08/24/2024   Advance care planning 10/21/2023   History of stroke 10/20/2023   Chronic diastolic CHF (congestive heart failure) (HCC) 10/20/2023   CKD (chronic kidney disease), stage III (HCC) 10/20/2023   Complicated UTI (urinary tract infection) 10/20/2023   UTI (urinary tract infection) 10/19/2023   Benign neoplasm of ascending colon    Fecal occult blood test positive    GI bleed 02/25/2022   Gross hematuria 02/25/2022   Malignant neoplasm of bladder, unspecified (HCC) 02/25/2022   Symptomatic anemia 02/25/2022   ABLA (acute blood loss anemia) 02/24/2022   Physical deconditioning 03/25/2021   Transient ischemic attack (TIA) 03/25/2021   Transient ischemic attack 03/24/2021   Stenosis of right carotid artery    Arthritis of left hip 09/01/2014   AAA (abdominal aortic aneurysm)    Essential hypertension    CAD (coronary artery  disease)    S/P CABG x 4 06/27/2013   Hyperlipidemia     Orientation RESPIRATION BLADDER Height & Weight     Self, Place  Normal Indwelling catheter Weight: 199 lb 1.2 oz (90.3 kg) Height:  5' 10 (177.8 cm)  BEHAVIORAL SYMPTOMS/MOOD NEUROLOGICAL BOWEL NUTRITION STATUS      Incontinent Diet (See DC Summary)  AMBULATORY STATUS COMMUNICATION OF NEEDS Skin   Extensive Assist Verbally Normal                       Personal Care Assistance Level of Assistance  Bathing, Feeding, Dressing Bathing Assistance: Maximum assistance Feeding assistance: Limited assistance Dressing Assistance: Maximum assistance     Functional Limitations Info  Hearing, Sight, Speech Sight Info: Adequate Hearing Info: Impaired Speech Info: Adequate    SPECIAL CARE FACTORS FREQUENCY  PT (By licensed PT), OT (By licensed OT)     PT Frequency: 5x/week OT Frequency: 5x/week            Contractures Contractures Info: Not present    Additional Factors Info  Code Status, Allergies Code Status Info: Full Allergies Info: Bactrim  (Sulfamethoxazole -trimethoprim )           Current Medications (08/30/2024):  This is the current hospital active medication list Current Facility-Administered Medications  Medication Dose Route Frequency Provider Last Rate Last Admin   acetaminophen  (TYLENOL ) tablet 650 mg  650 mg Oral Q6H PRN Smith, Rondell A, MD   650 mg at 08/28/24 2029   Or   acetaminophen  (TYLENOL ) suppository 650 mg  650 mg Rectal Q6H PRN Claudene,  Rondell A, MD       albuterol  (PROVENTIL ) (2.5 MG/3ML) 0.083% nebulizer solution 2.5 mg  2.5 mg Nebulization Q6H PRN Smith, Rondell A, MD       aspirin  EC tablet 81 mg  81 mg Oral Daily Briana Elgin LABOR, MD   81 mg at 08/30/24 9094   atorvastatin  (LIPITOR) tablet 40 mg  40 mg Oral QHS Briana Elgin LABOR, MD   40 mg at 08/29/24 2129   ceFEPIme  (MAXIPIME ) 2 g in sodium chloride  0.9 % 100 mL IVPB  2 g Intravenous Q24H Pokhrel, Laxman, MD       Chlorhexidine   Gluconate Cloth 2 % PADS 6 each  6 each Topical Daily Claudene Reeves A, MD   6 each at 08/30/24 1007   clopidogrel  (PLAVIX ) tablet 75 mg  75 mg Oral Daily Briana Elgin LABOR, MD   75 mg at 08/30/24 9094   cyanocobalamin  (VITAMIN B12) tablet 1,000 mcg  1,000 mcg Oral Daily Briana Elgin LABOR, MD   1,000 mcg at 08/30/24 9094   docusate sodium  (COLACE) capsule 100 mg  100 mg Oral BID Briana Elgin LABOR, MD   100 mg at 08/30/24 0905   feeding supplement (ENSURE PLUS HIGH PROTEIN) liquid 237 mL  237 mL Oral BID BM Briana Elgin LABOR, MD   237 mL at 08/30/24 1007   ferrous gluconate  (FERGON) tablet 324 mg  324 mg Oral BID WC Briana Elgin LABOR, MD   324 mg at 08/30/24 9094   flavoxATE (URISPAS ) tablet 100 mg  100 mg Oral TID PRN Pokhrel, Laxman, MD   100 mg at 08/30/24 9386   heparin  injection 5,000 Units  5,000 Units Subcutaneous Q8H Smith, Rondell A, MD   5,000 Units at 08/30/24 9386   metoprolol  tartrate (LOPRESSOR ) tablet 12.5 mg  12.5 mg Oral BID PRN Smith, Rondell A, MD       ondansetron  (ZOFRAN ) tablet 4 mg  4 mg Oral Q6H PRN Claudene Reeves LABOR, MD       Or   ondansetron  (ZOFRAN ) injection 4 mg  4 mg Intravenous Q6H PRN Smith, Rondell A, MD       Oral care mouth rinse  15 mL Mouth Rinse PRN Briana Elgin LABOR, MD       pantoprazole  (PROTONIX ) EC tablet 40 mg  40 mg Oral Daily Briana Elgin LABOR, MD   40 mg at 08/30/24 9095   saccharomyces boulardii (FLORASTOR) capsule 250 mg  250 mg Oral BID Smith, Rondell A, MD   250 mg at 08/30/24 9095   sodium chloride  flush (NS) 0.9 % injection 3 mL  3 mL Intravenous Q12H Smith, Rondell A, MD   3 mL at 08/30/24 1007   tamsulosin  (FLOMAX ) capsule 0.4 mg  0.4 mg Oral QPC supper Smith, Rondell A, MD   0.4 mg at 08/29/24 1659     Discharge Medications: Please see discharge summary for a list of discharge medications.  Relevant Imaging Results:  Relevant Lab Results:   Additional Information SSN: [id number]  Jeoffrey LITTIE Moose, LCSWA     "

## 2024-08-30 NOTE — Evaluation (Signed)
 Occupational Therapy Evaluation Patient Details Name: Tyler [name] MRN: [id number] DOB: [birth year] Today's Date: 08/30/2024   History of Present Illness   Pt is a 89 y.o. male admitted 08/23/24 for dysuria, small amounts of diarrhea/loose stool, and weakness. He was initially brought to [facility] 07/10/24 for suspected UTI and treated with four courses of antibiotics. CT noted bladder wall thickening concerning for cystitis. rine cultures were obtained.  Review of records note previous urine cultures from the [place] were positive for E. coli that was pansensitive. PMHx: bladder cancer s/p resection, recurrent UTI, CAD s/p CABG, diastolic heart failure, CKD stage IIIb, CVA, AAA s/p graft, right carotid artery occlusion and severe left ICA stenosis, HLD, HTN, anemia, and history of GI bleeding with AVMs.     Clinical Impressions Pt admitted with the above diagnosis and has the deficits listed below. Pt would benefit from cont OT to increase independence with basic adls and adl transfers so he can d/c back home with his wife, caregiver and daughter to assist. Pt was previously needing around mod assist with most adls and did some walking with walker with CG assist. Wife has dementia but family is very supportive. Feel if pt had less than 3 hours of therapy a day he may be able to get back to PLOF and d/c back home. Pt is not safe to d/c home at this time. Transfers at this point are easiest with STEDY. Pt able to pull self up with CG Assist. Will focus on transitioning off of the STEDY to regular transfers.       If plan is discharge home, recommend the following:   Two people to help with bathing/dressing/bathroom;Two people to help with walking and/or transfers;Assistance with cooking/housework;Assistance with feeding;Direct supervision/assist for medications management;Direct supervision/assist for financial management;Assist for transportation;Help with stairs or ramp for entrance;Supervision due to  cognitive status     Functional Status Assessment   Patient has had a recent decline in their functional status and demonstrates the ability to make significant improvements in function in a reasonable and predictable amount of time.     Equipment Recommendations   None recommended by OT     Recommendations for Other Services         Precautions/Restrictions   Precautions Precautions: Fall Recall of Precautions/Restrictions: Impaired Precaution/Restrictions Comments: check BP with postural changes if symptomatic (pt has TED hose in room PRN), pt has geomat cushion now Restrictions Weight Bearing Restrictions Per Provider Order: No     Mobility Bed Mobility Overal bed mobility: Needs Assistance Bed Mobility: Supine to Sit     Supine to sit: Mod assist, HOB elevated, Used rails     General bed mobility comments: Cues for hand placement and step by step instructions.    Transfers Overall transfer level: Needs assistance Equipment used: Ambulation equipment used Transfers: Sit to/from Stand, Bed to chair/wheelchair/BSC Sit to Stand: From elevated surface, Mod assist, +2 physical assistance, +2 safety/equipment, Via lift equipment           General transfer comment: From chair> Stedy and Stedy>EOB. once on EOB, pt encouraged to attempt posterior scooting back further on to his bed, but poor propulsion achieved with +2 HHA and feet blocked. Pt transferred sit to stand with STEDY 10 times while cleaning from bowel movement. Pt able to come to stand with STEDY and CG Assist. First attempt pt stood for 3 minutes with min assist.  Each successive stand, pt became fatigued but able to stand multiple attempts with less assist  on this date. Transfer via Lift Equipment: Stedy    Balance Overall balance assessment: Needs assistance Sitting-balance support: Bilateral upper extremity supported, Feet supported, No upper extremity supported Sitting balance-Leahy Scale:  Fair Sitting balance - Comments: Pt sat Supervision on EOB for appx 4 minutes while resting in between standing.   Standing balance support: Bilateral upper extremity supported, During functional activity, Reliant on assistive device for balance Standing balance-Leahy Scale: Poor Standing balance comment: +2 HHA or Stedy rails required to maintain                           ADL either performed or assessed with clinical judgement   ADL Overall ADL's : Needs assistance/impaired Eating/Feeding: Minimal assistance;Sitting   Grooming: Wash/dry hands;Wash/dry face;Oral care;Minimal assistance;Sitting   Upper Body Bathing: Minimal assistance;Sitting   Lower Body Bathing: Maximal assistance;+2 for physical assistance;Sit to/from stand   Upper Body Dressing : Moderate assistance;Sitting   Lower Body Dressing: Maximal assistance;+2 for physical assistance;Sit to/from stand   Toilet Transfer: Maximal assistance;Stand-pivot;BSC/3in1   Toileting- Clothing Manipulation and Hygiene: Maximal assistance;+2 for physical assistance Toileting - Clothing Manipulation Details (indicate cue type and reason): max assist to stand and second person to pull pants up. Pt does very well with STEDY for all transfers.     Functional mobility during ADLs: Maximal assistance General ADL Comments: Pt very limited due to weakness, tremors, fear of falling and decreased activity tolerance.     Vision Baseline Vision/History: 0 No visual deficits Ability to See in Adequate Light: 0 Adequate Patient Visual Report: No change from baseline Vision Assessment?: No apparent visual deficits     Perception Perception: Within Functional Limits       Praxis         Pertinent Vitals/Pain Pain Assessment Pain Assessment: Faces Faces Pain Scale: Hurts little more Pain Location: bottom and lower back and c/o stinging with urination into his briefs Pain Descriptors / Indicators: Discomfort, Sore, Tender,  Grimacing, Guarding Pain Intervention(s): Limited activity within patient's tolerance, Monitored during session, Premedicated before session, Repositioned     Extremity/Trunk Assessment Upper Extremity Assessment Upper Extremity Assessment: Generalized weakness   Lower Extremity Assessment Lower Extremity Assessment: Defer to PT evaluation   Cervical / Trunk Assessment Cervical / Trunk Assessment: Kyphotic   Communication Communication Communication: Impaired Factors Affecting Communication: Hearing impaired   Cognition Arousal: Alert Behavior During Therapy: WFL for tasks assessed/performed Cognition: History of cognitive impairments             OT - Cognition Comments: Pt with baseline dementia. Pt with decreased STM, slow processing time, decreased awareness and decreased initiation. Pt with mild anxiety and fear of falling.                 Following commands: Impaired Following commands impaired: Follows one step commands with increased time, Only follows one step commands consistently     Cueing  General Comments   Cueing Techniques: Verbal cues;Gestural cues;Tactile cues  Pt much improved today with standing tasks.   Exercises     Shoulder Instructions      Home Living Family/patient expects to be discharged to:: Private residence Living Arrangements: Spouse/significant other Available Help at Discharge: Family;Personal care attendant;Available PRN/intermittently Type of Home: House Home Access: Stairs to enter [employer] of Steps: 3 Entrance Stairs-Rails: Right;Left;Can reach both Home Layout: Two level;Able to live on main level with bedroom/bathroom Alternate Level Stairs-Number of Steps: flight - patient unable to ascend  Bathroom Shower/Tub: Producer, Television/film/video: Handicapped height Bathroom Accessibility: Yes How Accessible: Accessible via walker Home Equipment: Educational Psychologist (2 wheels);BSC/3in1;Grab  bars - tub/shower;Wheelchair - Museum/gallery Conservator - single point          Prior Functioning/Environment Prior Level of Function : Needs assist       Physical Assist : ADLs (physical)   ADLs (physical): Dressing;Bathing;IADLs Mobility Comments: Ambulates using RW. Denies fall hx. Daughter reports the week before he was admitted they were using a w/c. Otherwise only used w/c to go to appts or if it was an unknown distance. ADLs Comments: Pt reports he can do self-care mostly himself. Daughter assists with dressing, bathing, meds, finances, appointments, and transportation.    OT Problem List: Decreased strength;Decreased activity tolerance;Impaired balance (sitting and/or standing);Decreased cognition;Decreased safety awareness;Decreased knowledge of use of DME or AE;Decreased knowledge of precautions;Pain   OT Treatment/Interventions: Self-care/ADL training;Therapeutic activities;DME and/or AE instruction;Balance training      OT Goals(Current goals can be found in the care plan section)   Acute Rehab OT Goals Patient Stated Goal: to get back to my prior level of functioning. OT Goal Formulation: With patient/family Time For Goal Achievement: 09/13/24 Potential to Achieve Goals: Good ADL Goals Pt Will Perform Eating: with set-up;sitting Pt Will Perform Grooming: with set-up;sitting Pt Will Perform Lower Body Bathing: with mod assist;sit to/from stand Pt Will Perform Lower Body Dressing: with mod assist;sit to/from stand Pt Will Transfer to Toilet: with mod assist;stand pivot transfer Pt Will Perform Toileting - Clothing Manipulation and hygiene: with mod assist;sitting/lateral leans;sit to/from stand Pt Will Perform Tub/Shower Transfer: Shower transfer;with mod assist;ambulating;shower seat;rolling walker   OT Frequency:  Min 2X/week    Co-evaluation              AM-PAC OT 6 Clicks Daily Activity     Outcome Measure Help from another person eating  meals?: A Little Help from another person taking care of personal grooming?: A Little Help from another person toileting, which includes using toliet, bedpan, or urinal?: Total Help from another person bathing (including washing, rinsing, drying)?: A Lot Help from another person to put on and taking off regular upper body clothing?: A Lot Help from another person to put on and taking off regular lower body clothing?: Total 6 Click Score: 12   End of Session Equipment Utilized During Treatment: Other (comment) (STEDY) Nurse Communication: Mobility status  Activity Tolerance: Patient limited by fatigue Patient left: in chair;with call bell/phone within reach;with chair alarm set  OT Visit Diagnosis: Unsteadiness on feet (R26.81);Other abnormalities of gait and mobility (R26.89);Muscle weakness (generalized) (M62.81);Pain Pain - part of body:  (back)                Time: 1051-1130 OT Time Calculation (min): 39 min Charges:  OT General Charges $OT Visit: 1 Visit OT Evaluation $OT Eval Moderate Complexity: 1 Mod OT Treatments $Self Care/Home Management : 8-22 mins  Joshua Silvano Dragon 08/30/2024, 11:57 AM

## 2024-08-30 NOTE — Plan of Care (Signed)
" °  Problem: Elimination: Goal: Will not experience complications related to urinary retention Outcome: Progressing   Problem: Elimination: Goal: Will not experience complications related to bowel motility Outcome: Progressing   Problem: Pain Managment: Goal: General experience of comfort will improve and/or be controlled Outcome: Progressing   "

## 2024-08-30 NOTE — TOC Transition Note (Addendum)
 Transition of Care [facility]) - Discharge Note   Patient Details  Name: Tyler [name] MRN: [id number] Date of Birth: 02-13-1929  Transition of Care [facility]) CM/SW Contact:  Jeoffrey LITTIE Moose, [place] Phone Number: 08/30/2024, 1:51 PM   Clinical Narrative:    Patient will DC to: [facility] Anticipated DC date: 08/30/24 Family notified: Yes Transport by: [facility]    Per MD patient ready for DC to [facility]. RN to call report prior to discharge [phone] room 706 . RN, patient, patient's family, and facility notified of DC. Discharge Summary and FL2 sent to facility. DC packet on chart. Ambulance transport requested for patient.   CSW will sign off for now as social work intervention is no longer needed. Please consult us  again if new needs arise.     Final next level of care: Skilled Nursing Facility Barriers to Discharge: Barriers Resolved   Patient Goals and CMS Choice Patient states their goals for this hospitalization and ongoing recovery are:: SNF          Discharge Placement   Existing PASRR number confirmed : 08/30/24          Patient chooses bed at: [facility] Patient to be transferred to facility by: [facility] Name of family member notified: Katheleen Patient and family notified of of transfer: 08/30/24  Discharge Plan and Services Additional resources added to the After Visit Summary for                                       Social Drivers of Health (SDOH) Interventions SDOH Screenings   Food Insecurity: No Food Insecurity (08/24/2024)  Housing: Low Risk (08/24/2024)  Transportation Needs: No Transportation Needs (08/24/2024)  Utilities: Not At Risk (08/24/2024)  Social Connections: Socially Integrated (08/24/2024)  Tobacco Use: Low Risk (08/24/2024)     Readmission Risk Interventions     No data to display

## 2024-08-30 NOTE — Discharge Summary (Signed)
 "  Physician Discharge Summary  Tyler [name] FMW:[id number] DOB: [birth year] DOA: 08/23/2024  PCP: Milissa Savant, MD  Admit date: 08/23/2024 Discharge date: 08/30/2024  Admitted From: Home  Discharge disposition: Skilled nursing facility   Recommendations for Outpatient Follow-Up:   Follow up with your primary care provider at the skilled nursing facility in 3 to 5 days Check CBC, BMP, magnesium  in the next visit Follow-up with urology at [facility] for bladder symptoms.  Has been scheduled   Discharge Diagnosis:   Principal Problem:   Complicated urinary tract infection Active Problems:   Diarrhea   Leukocytosis   BPH with urinary obstruction   Bilateral hydronephrosis   Acute kidney injury superimposed on chronic kidney disease   Essential hypertension   Anemia of chronic disease   Generalized weakness  Discharge Condition: Improved.  Diet recommendation: Low sodium, heart healthy.    Wound care: None.  Code status: Full.   History of Present Illness:   Tyler [name] is a 89 y.o. male with a history of bladder cancer s/p resection, recurrent UTI, CAD s/p CABG, HFpEF, CKD stage IIIb, CVA, AAA s/p graft repair, carotid artery disease, anemia and GI bleed presented to hospital with dysuria.  Patient was noted to have UTI with AKI secondary to bladder outlet obstruction with bilateral hydronephrosis.  Foley catheter was placed in and was started on empiric cefepime .  Patient was then admitted to the hospital for further evaluation and treatment.   Hospital Course:   Following conditions were addressed during hospitalization as listed below,  Complicated UTI, Pseudomonas UTI Received cefepime  since 08/26/2023 and has completed the course..  Will need follow-up with urology as outpatient for dysuria issue.  Patient's family had reached out to urology at [facility] and has an appointment for next week.. Have added flavoxate    diarrhea Unclear.  Has resolved.  Continue probiotic  for the next 2 weeks.   Acute urinary retention Presumed secondary to BPH. Patient is on alfluzosin as an outpatient. Foley catheter placed on 1/31 and has been discontinued.  Continue tamsulosin  for now.  Has been urinating well.  Will follow-up with urology as outpatient for dysuria.   AKI on CKD stage IIIb Baseline creatinine of 1.4. Creatinine of 1.83 on admission.  Latest creatinine of 1.3.  Initially required Foley catheter.  Currently urinating well.   BPH Resume alfuzosin    Essential hypertension  At some point was on metoprolol  but not currently on it.   Possible pneumonitis Noted on CT imaging. No symptoms.  No indication for antibiotic   Anemia of chronic kidney disease Continue to monitor.  Latest hemoglobin of 10.7.   Bilateral pleural effusions Minimal. Asymptomatic.    constipation Continue stool softeners on discharge.   CAD, history of CABG Continue aspirin , Plavix  and Lipitor.    Chronic HFpEF Appears compensated.  Has not been on diuretic for a while.  Continue metoprolol    History of CVA History of carotid artery stenosis Continue aspirin , Plavix  and Lipitor, metoprolol    History of AAA Status post graft.  Currently stable.   Generalized weakness/debility, frailty, advanced age Patient was seen by physical therapy and recommended skilled nursing facility placement on discharge.   Disposition.  At this time, patient is stable for disposition to skilled facility.  Spoke with the patient's daughter at bedside.  Medical Consultants:   None.  Procedures:    Foley catheter insertion and removal  Subjective:   Today, patient was seen and examined at bedside.  Alert awake and communicative.  Denies any nausea vomiting fever chills or rigor.  Still has some dysuria   Discharge Exam:   Vitals:   08/30/24 0442 08/30/24 0945  BP: (!) 107/53 (!) 105/55  Pulse: 80 82  Resp: 18 16  Temp: 98.8 F (37.1 C) 97.8 F (36.6 C)  SpO2: 92% 94%    Vitals:   08/29/24 1643 08/29/24 2305 08/30/24 0442 08/30/24 0945  BP: (!) 101/54 (!) 113/57 (!) 107/53 (!) 105/55  Pulse: 83 80 80 82  Resp: 14 14 18 16   Temp: 97.8 F (36.6 C) 98.6 F (37 C) 98.8 F (37.1 C) 97.8 F (36.6 C)  TempSrc: Oral Oral  Oral  SpO2: 98% 97% 92% 94%  Weight:      Height:        General: Alert awake, not in obvious distress, hard of hearing, elderly male, weak and deconditioned HENT: pupils equally reacting to light,  No scleral pallor or icterus noted. Oral mucosa is moist.  Chest:  Clear breath sounds.  Diminished breath sounds bilaterally. No crackles or wheezes.  CVS: S1 &S2 heard. No murmur.  Regular rate and rhythm. Abdomen: Soft, nontender, nondistended.  Bowel sounds are heard.   Extremities: No cyanosis, clubbing or edema.  Peripheral pulses are palpable. Psych: Alert, awake and oriented, normal mood CNS:  No cranial nerve deficits.  Generalized weakness. Skin: Warm and dry.  No rashes noted.  The results of significant diagnostics from this hospitalization (including imaging, microbiology, ancillary and laboratory) are listed below for reference.     Diagnostic Studies:   CT ABDOMEN PELVIS WO CONTRAST Result Date: 08/24/2024 EXAM: CT ABDOMEN AND PELVIS WITHOUT CONTRAST 08/24/2024 02:18:00 AM TECHNIQUE: CT of the abdomen and pelvis was performed without the administration of intravenous contrast. Multiplanar reformatted images are provided for review. Automated exposure control, iterative reconstruction, and/or weight-based adjustment of the mA/kV was utilized to reduce the radiation dose to as low as reasonably achievable. COMPARISON: CTA abdomen and pelvis 11/29/2023, and CTA chest abdomen and pelvis 10/31/2023. CLINICAL HISTORY: Abdominal pain, acute, nonlocalized. Acute, nonlocalized abdominal pain. FINDINGS: LOWER CHEST: The cardiac size is normal. There are calcifications and thickening of the aortic valve leaflets, calcification of the  inferior mitral ring, and 3-vessel coronary calcifications. No pericardial effusion. There is a new small layering left and minimal right layering pleural effusions. There is chronic right lower lobe infrahilar ground-glass opacity and diffuse bronchial thickening in the visualized lungs. There is subtle peribronchovascular ground-glass opacity in the right middle lobe and base of the right upper lobe which is probably due to pneumonitis. There is background subpleural reticulation in the lung bases, with chronic change. Median sternotomy sutures and CABG changes. LIVER: The liver is unremarkable. GALLBLADDER AND BILE DUCTS: Gallbladder is unremarkable. No biliary ductal dilatation. SPLEEN: No acute abnormality. PANCREAS: No acute abnormality. ADRENAL GLANDS: No adrenal mass. KIDNEYS, URETERS AND BLADDER: No contour deforming abnormality of the kidneys is seen. There is a 2 mm calyceal stone in the inferior pole of the right kidney. No other visible nephrolithiasis. Bilateral perinephric stranding is similar to the prior studies. There is interval new mild-to-moderate bilateral hydroureteronephrosis extending to the bladder, without ureteral or bladder stones being seen. The bladder wall is trabeculated and mildly thickened. Perivesical stranding is seen over portions of the bladder and may suggest cystitis. There is suspected to be an element of chronic bladder outlet obstruction due to an enlarged prostate which measures 5.3 cm and presses into the bladder base, not optimally seen due to  metallic artifact from a left hip replacement. GI AND BOWEL: Stomach demonstrates no acute abnormality. There is no bowel obstruction or inflammation. The appendix is normal. There is scattered colonic diverticulosis without evidence for diverticulitis. PERITONEUM AND RETROPERITONEUM: Trace presacral ascites. No free hemorrhage or free air. VASCULATURE: Moderate to heavy aortoiliac calcification atherosclerosis with aortobiiliac  stent graft in place. The excluded aneurysm sac is 6.6 cm today, previously 6.3 cm. Stable dilatation in the common iliac arteries, right greater than left. Assessment for an endoleak is not possible without contrast. No periaortic inflammatory changes are seen. LYMPH NODES: No lymphadenopathy. REPRODUCTIVE ORGANS: Enlarged prostate as described above. BONES AND SOFT TISSUES: Osteopenia. Advanced degenerative change in the thoracolumbar spine. Ankylosis of the right SI joint. Moderately advanced right hip arthrosis with left hip arthroplasty. Multiple pelvic phleboliths. IMPRESSION: 1. New mild-to-moderate bilateral hydroureteronephrosis to the bladder without ureteral or bladder stones, likely related to bladder outlet obstruction from prostatomegaly, with bladder wall thickening and perivesical stranding suggesting cystitis. 2. Aortoiliac atherosclerosis with aortobiiliac stent graft in place and stable dilatation of the common iliac arteries, right greater than left, with slight increase in excluded aneurysm sac size to 6.6 cm from 6.3 cm. 3. Subtle peribronchovascular ground glass densities in the right upper and middle lobes. Probable pneumonitis. 4. Small left and minimal right pleural effusions. Electronically signed by: Francis Quam MD 08/24/2024 04:55 AM EST RP Workstation: [id number]     Labs:   Basic Metabolic Panel: Recent Labs  Lab 08/23/24 1538 08/25/24 0501 08/26/24 1321 08/29/24 0435  NA 139 141 136 140  K 3.6 3.2* 4.2 4.4  CL 103 108 103 105  CO2 21* 22 23 27   GLUCOSE 100* 89 149* 98  BUN 30* 24* 22 26*  CREATININE 1.83* 1.68* 1.56* 1.36*  CALCIUM  8.5* 7.9* 8.3* 8.8*   GFR Estimated Creatinine Clearance: 38.4 mL/min (A) (by C-G formula based on SCr of 1.36 mg/dL (H)). Liver Function Tests: Recent Labs  Lab 08/24/24 0021  AST 32  ALT 30  ALKPHOS 86  BILITOT 0.8  PROT 6.7  ALBUMIN  3.3*   No results for input(s): LIPASE, AMYLASE in the last 168 hours. No results  for input(s): AMMONIA in the last 168 hours. Coagulation profile No results for input(s): INR, PROTIME in the last 168 hours.  CBC: Recent Labs  Lab 08/23/24 1538 08/25/24 0501 08/26/24 1321 08/29/24 0435  WBC 18.4* 12.4* 11.4* 13.0*  HGB 11.9* 9.6* 10.6* 10.7*  HCT 36.1* 29.3* 32.3* 32.8*  MCV 94.3 94.5 95.0 94.3  PLT 228 176 187 225   Cardiac Enzymes: No results for input(s): CKTOTAL, CKMB, CKMBINDEX, TROPONINI in the last 168 hours. BNP: Invalid input(s): POCBNP CBG: Recent Labs  Lab 08/24/24 1653  GLUCAP 111*   D-Dimer No results for input(s): DDIMER in the last 72 hours. Hgb A1c No results for input(s): HGBA1C in the last 72 hours. Lipid Profile No results for input(s): CHOL, HDL, LDLCALC, TRIG, CHOLHDL, LDLDIRECT in the last 72 hours. Thyroid function studies No results for input(s): TSH, T4TOTAL, T3FREE, THYROIDAB in the last 72 hours.  Invalid input(s): FREET3 Anemia work up No results for input(s): VITAMINB12, FOLATE, FERRITIN, TIBC, IRON , RETICCTPCT in the last 72 hours. Microbiology Recent Results (from the past 240 hours)  Resp panel by RT-PCR (RSV, Flu A&B, Covid) Anterior Nasal Swab     Status: None   Collection Time: 08/23/24  4:15 PM   Specimen: Anterior Nasal Swab  Result Value Ref Range Status   SARS Coronavirus 2  by RT PCR NEGATIVE NEGATIVE Final   Influenza A by PCR NEGATIVE NEGATIVE Final   Influenza B by PCR NEGATIVE NEGATIVE Final    Comment: (NOTE) The Xpert Xpress SARS-CoV-2/FLU/RSV plus assay is intended as an aid in the diagnosis of influenza from Nasopharyngeal swab specimens and should not be used as a sole basis for treatment. Nasal washings and aspirates are unacceptable for Xpert Xpress SARS-CoV-2/FLU/RSV testing.  Fact Sheet for Patients: bloggercourse.com  Fact Sheet for Healthcare Providers: seriousbroker.it  This test  is not yet approved or cleared by the [place]  FDA and has been authorized for detection and/or diagnosis of SARS-CoV-2 by FDA under an Emergency Use Authorization (EUA). This EUA will remain in effect (meaning this test can be used) for the duration of the COVID-19 declaration under Section 564(b)(1) of the Act, 21 U.S.C. section 360bbb-3(b)(1), unless the authorization is terminated or revoked.     Resp Syncytial Virus by PCR NEGATIVE NEGATIVE Final    Comment: (NOTE) Fact Sheet for Patients: bloggercourse.com  Fact Sheet for Healthcare Providers: seriousbroker.it  This test is not yet approved or cleared by the [place]  FDA and has been authorized for detection and/or diagnosis of SARS-CoV-2 by FDA under an Emergency Use Authorization (EUA). This EUA will remain in effect (meaning this test can be used) for the duration of the COVID-19 declaration under Section 564(b)(1) of the Act, 21 U.S.C. section 360bbb-3(b)(1), unless the authorization is terminated or revoked.  Performed at [facility], [address] [place], [place] [address]   Urine Culture     Status: Abnormal   Collection Time: 08/24/24 12:06 AM   Specimen: Urine, Random  Result Value Ref Range Status   Specimen Description URINE, RANDOM  Final   Special Requests   Final    NONE Reflexed from [id number] Performed at [facility], [address], [place], [place] [address]    Culture >=100,000 COLONIES/mL PSEUDOMONAS AERUGINOSA (A)  Final   Report Status 08/26/2024 FINAL  Final   Organism ID, Bacteria PSEUDOMONAS AERUGINOSA (A)  Final      Susceptibility   Pseudomonas aeruginosa - MIC*    MEROPENEM <=0.25 SENSITIVE Sensitive     CIPROFLOXACIN <=0.06 SENSITIVE Sensitive     IMIPENEM 2 SENSITIVE Sensitive     PIP/TAZO Value in next row Sensitive      <=4 SENSITIVEThis is a modified FDA-approved test that has been validated and its performance  characteristics determined by the reporting laboratory.  This laboratory is certified under the Clinical Laboratory Improvement Amendments CLIA as qualified to perform high complexity clinical laboratory testing.    CEFEPIME  Value in next row Sensitive      <=4 SENSITIVEThis is a modified FDA-approved test that has been validated and its performance characteristics determined by the reporting laboratory.  This laboratory is certified under the Clinical Laboratory Improvement Amendments CLIA as qualified to perform high complexity clinical laboratory testing.    CEFTAZIDIME/AVIBACTAM Value in next row Sensitive      <=4 SENSITIVEThis is a modified FDA-approved test that has been validated and its performance characteristics determined by the reporting laboratory.  This laboratory is certified under the Clinical Laboratory Improvement Amendments CLIA as qualified to perform high complexity clinical laboratory testing.    CEFTOLOZANE/TAZOBACTAM Value in next row Sensitive      <=4 SENSITIVEThis is a modified FDA-approved test that has been validated and its performance characteristics determined by the reporting laboratory.  This laboratory is certified under the Clinical  Laboratory Improvement Amendments CLIA as qualified to perform high complexity clinical laboratory testing.    TOBRAMYCIN Value in next row Sensitive      <=4 SENSITIVEThis is a modified FDA-approved test that has been validated and its performance characteristics determined by the reporting laboratory.  This laboratory is certified under the Clinical Laboratory Improvement Amendments CLIA as qualified to perform high complexity clinical laboratory testing.    CEFTAZIDIME Value in next row Sensitive      <=4 SENSITIVEThis is a modified FDA-approved test that has been validated and its performance characteristics determined by the reporting laboratory.  This laboratory is certified under the Clinical Laboratory Improvement Amendments CLIA as  qualified to perform high complexity clinical laboratory testing.    * >=100,000 COLONIES/mL PSEUDOMONAS AERUGINOSA  Culture, blood (routine x 2)     Status: None   Collection Time: 08/24/24 12:20 AM   Specimen: BLOOD RIGHT ARM  Result Value Ref Range Status   Specimen Description BLOOD RIGHT ARM  Final   Special Requests   Final    BOTTLES DRAWN AEROBIC AND ANAEROBIC Blood Culture adequate volume   Culture   Final    NO GROWTH 5 DAYS Performed at [facility], [address], [place], [place] [address]    Report Status 08/29/2024 FINAL  Final  Culture, blood (routine x 2)     Status: None   Collection Time: 08/24/24  1:08 AM   Specimen: BLOOD LEFT ARM  Result Value Ref Range Status   Specimen Description BLOOD LEFT ARM  Final   Special Requests   Final    BOTTLES DRAWN AEROBIC AND ANAEROBIC Blood Culture adequate volume   Culture   Final    NO GROWTH 5 DAYS Performed at [facility], [address], [place], [place] [address]    Report Status 08/29/2024 FINAL  Final  C Difficile Quick Screen w PCR reflex     Status: None   Collection Time: 08/24/24 10:11 AM   Specimen: Stool  Result Value Ref Range Status   C Diff antigen NEGATIVE NEGATIVE Final   C Diff toxin NEGATIVE NEGATIVE Final   C Diff interpretation No C. difficile detected.  Final    Comment: Performed at [facility], [address] [place], [place] [address]     Discharge Instructions:   Discharge Instructions     Call MD for:  persistant nausea and vomiting   Complete by: As directed    Call MD for:  severe uncontrolled pain   Complete by: As directed    Call MD for:  temperature >100.4   Complete by: As directed    Diet - low sodium heart healthy   Complete by: As directed    Discharge instructions   Complete by: As directed    Follow-up with your urologist as has been scheduled for ongoing issues .   Increase activity slowly   Complete by: As directed    No wound care   Complete  by: As directed       Allergies as of 08/30/2024       Reactions   Bactrim  [sulfamethoxazole -trimethoprim ] Itching, Rash        Medication List     STOP taking these medications    cephALEXin  500 MG capsule Commonly known as: KEFLEX    diclofenac  Sodium 1 % Gel Commonly known as: Voltaren    furosemide  20 MG tablet Commonly known as: LASIX    lidocaine  5 % Commonly known as:  Lidoderm    metoprolol  tartrate 25 MG tablet Commonly known as: LOPRESSOR        TAKE these medications    acetaminophen  325 MG tablet Commonly known as: TYLENOL  Take 2 tablets (650 mg total) by mouth every 6 (six) hours as needed for mild pain (pain score 1-3) or fever (or Fever >/= 101).   alfuzosin  10 MG 24 hr tablet Commonly known as: UROXATRAL  Take 1 tablet by mouth at bedtime.   aspirin  EC 81 MG tablet Take 1 tablet (81 mg total) by mouth daily. Swallow whole. Hold if  overt external bleeding   atorvastatin  40 MG tablet Commonly known as: LIPITOR Take 40 mg by mouth at bedtime.   clopidogrel  75 MG tablet Commonly known as: PLAVIX  Take 1 tablet (75 mg total) by mouth daily. Hold plavix , resume when ok with urology What changed:  when to take this additional instructions   cyanocobalamin  1000 MCG tablet Take 1 tablet (1,000 mcg total) by mouth daily.   docusate sodium  100 MG capsule Commonly known as: COLACE Take 1 capsule (100 mg total) by mouth 2 (two) times daily.   feeding supplement Liqd Take 237 mLs by mouth 2 (two) times daily between meals.   ferrous gluconate  324 MG tablet Commonly known as: FERGON Take 324 mg by mouth 2 (two) times daily with a meal.   flavoxATE 100 MG tablet Commonly known as: URISPAS  Take 1 tablet (100 mg total) by mouth 3 (three) times daily as needed for up to 5 days for bladder spasms.   nitroGLYCERIN  0.4 MG SL tablet Commonly known as: NITROSTAT  Place 1 tablet (0.4 mg total) under the tongue every 5 (five) minutes as needed for chest  pain.   ondansetron  4 MG tablet Commonly known as: ZOFRAN  Take 1 tablet (4 mg total) by mouth every 8 (eight) hours as needed for nausea or vomiting.   pantoprazole  40 MG tablet Commonly known as: Protonix  Take 1 tablet (40 mg total) by mouth daily.   saccharomyces boulardii 250 MG capsule Commonly known as: FLORASTOR Take 1 capsule (250 mg total) by mouth 2 (two) times daily for 14 days.   triamcinolone  cream 0.1 % Commonly known as: KENALOG  Apply 1 Application topically 2 (two) times daily. What changed:  when to take this reasons to take this        Contact information for follow-up providers     Milissa Savant, MD Follow up.   Specialty: Internal Medicine Contact information: [address] [place] [facility] [phone]              Contact information for after-discharge care     Destination     [facility], [place] .   Service: Skilled Nursing Contact information: [address] [place]  [address] [phone]                      Time coordinating discharge: 39 minutes  Signed:  Vinia Jemmott  [facility] 08/30/2024, 1:41 PM          "
